# Patient Record
Sex: Male | Born: 1935 | Race: White | Hispanic: No | Marital: Married | Smoking: Never smoker
Health system: Southern US, Community
[De-identification: ages and names within clinical notes are randomized; demographics above are authoritative.]

## PROBLEM LIST (undated history)

## (undated) DIAGNOSIS — G2581 Restless legs syndrome: Principal | ICD-10-CM

## (undated) DIAGNOSIS — I350 Nonrheumatic aortic (valve) stenosis: Secondary | ICD-10-CM

## (undated) DIAGNOSIS — G609 Hereditary and idiopathic neuropathy, unspecified: Secondary | ICD-10-CM

## (undated) DIAGNOSIS — I447 Left bundle-branch block, unspecified: Secondary | ICD-10-CM

## (undated) DIAGNOSIS — I493 Ventricular premature depolarization: Secondary | ICD-10-CM

## (undated) DIAGNOSIS — N189 Chronic kidney disease, unspecified: Secondary | ICD-10-CM

## (undated) DIAGNOSIS — I779 Disorder of arteries and arterioles, unspecified: Secondary | ICD-10-CM

## (undated) DIAGNOSIS — I255 Ischemic cardiomyopathy: Secondary | ICD-10-CM

## (undated) DIAGNOSIS — M1990 Unspecified osteoarthritis, unspecified site: Secondary | ICD-10-CM

## (undated) DIAGNOSIS — I5042 Chronic combined systolic (congestive) and diastolic (congestive) heart failure: Secondary | ICD-10-CM

## (undated) DIAGNOSIS — I495 Sick sinus syndrome: Secondary | ICD-10-CM

## (undated) DIAGNOSIS — E7800 Pure hypercholesterolemia, unspecified: Secondary | ICD-10-CM

## (undated) DIAGNOSIS — H353130 Nonexudative age-related macular degeneration, bilateral, stage unspecified: Secondary | ICD-10-CM

## (undated) DIAGNOSIS — E119 Type 2 diabetes mellitus without complications: Secondary | ICD-10-CM

## (undated) DIAGNOSIS — I739 Peripheral vascular disease, unspecified: Secondary | ICD-10-CM

## (undated) DIAGNOSIS — M810 Age-related osteoporosis without current pathological fracture: Secondary | ICD-10-CM

## (undated) DIAGNOSIS — K219 Gastro-esophageal reflux disease without esophagitis: Secondary | ICD-10-CM

## (undated) DIAGNOSIS — I4819 Other persistent atrial fibrillation: Secondary | ICD-10-CM

## (undated) DIAGNOSIS — I10 Essential (primary) hypertension: Secondary | ICD-10-CM

## (undated) DIAGNOSIS — Z8719 Personal history of other diseases of the digestive system: Secondary | ICD-10-CM

## (undated) DIAGNOSIS — I2510 Atherosclerotic heart disease of native coronary artery without angina pectoris: Secondary | ICD-10-CM

## (undated) DIAGNOSIS — R002 Palpitations: Secondary | ICD-10-CM

## (undated) HISTORY — DX: Nonrheumatic aortic (valve) stenosis: I35.0

## (undated) HISTORY — DX: Pure hypercholesterolemia, unspecified: E78.00

## (undated) HISTORY — DX: Restless legs syndrome: G25.81

## (undated) HISTORY — DX: Ischemic cardiomyopathy: I25.5

## (undated) HISTORY — DX: Chronic combined systolic (congestive) and diastolic (congestive) heart failure: I50.42

## (undated) HISTORY — PX: HERNIA REPAIR: SHX51

## (undated) HISTORY — PX: HIATAL HERNIA REPAIR: SHX195

## (undated) HISTORY — DX: Palpitations: R00.2

## (undated) HISTORY — PX: APPENDECTOMY: SHX54

## (undated) HISTORY — DX: Disorder of arteries and arterioles, unspecified: I77.9

## (undated) HISTORY — DX: Essential (primary) hypertension: I10

## (undated) HISTORY — DX: Peripheral vascular disease, unspecified: I73.9

## (undated) HISTORY — DX: Hereditary and idiopathic neuropathy, unspecified: G60.9

## (undated) HISTORY — DX: Left bundle-branch block, unspecified: I44.7

## (undated) HISTORY — DX: Ventricular premature depolarization: I49.3

## (undated) HISTORY — DX: Sick sinus syndrome: I49.5

## (undated) HISTORY — DX: Other persistent atrial fibrillation: I48.19

## (undated) HISTORY — PX: CATARACT EXTRACTION W/ INTRAOCULAR LENS  IMPLANT, BILATERAL: SHX1307

## (undated) HISTORY — PX: TONSILLECTOMY: SUR1361

## (undated) HISTORY — PX: INGUINAL HERNIA REPAIR: SUR1180

---

## 1997-04-07 HISTORY — PX: CORONARY ARTERY BYPASS GRAFT: SHX141

## 1997-04-26 DIAGNOSIS — I493 Ventricular premature depolarization: Secondary | ICD-10-CM

## 1997-04-26 HISTORY — PX: CARDIAC CATHETERIZATION: SHX172

## 1997-04-26 HISTORY — DX: Ventricular premature depolarization: I49.3

## 2004-08-06 ENCOUNTER — Encounter (INDEPENDENT_AMBULATORY_CARE_PROVIDER_SITE_OTHER): Payer: Self-pay | Admitting: *Deleted

## 2004-08-06 ENCOUNTER — Observation Stay (HOSPITAL_COMMUNITY): Admission: RE | Admit: 2004-08-06 | Discharge: 2004-08-07 | Payer: Self-pay | Admitting: General Surgery

## 2005-12-31 ENCOUNTER — Ambulatory Visit (HOSPITAL_COMMUNITY): Admission: RE | Admit: 2005-12-31 | Discharge: 2005-12-31 | Payer: Self-pay | Admitting: Family Medicine

## 2006-01-07 ENCOUNTER — Ambulatory Visit (HOSPITAL_COMMUNITY): Admission: RE | Admit: 2006-01-07 | Discharge: 2006-01-07 | Payer: Self-pay | Admitting: Family Medicine

## 2006-02-04 ENCOUNTER — Ambulatory Visit (HOSPITAL_COMMUNITY): Admission: RE | Admit: 2006-02-04 | Discharge: 2006-02-04 | Payer: Self-pay | Admitting: Family Medicine

## 2006-03-12 ENCOUNTER — Encounter: Admission: RE | Admit: 2006-03-12 | Discharge: 2006-03-12 | Payer: Self-pay | Admitting: Endocrinology

## 2006-08-05 ENCOUNTER — Ambulatory Visit (HOSPITAL_COMMUNITY): Admission: RE | Admit: 2006-08-05 | Discharge: 2006-08-05 | Payer: Self-pay | Admitting: Family Medicine

## 2006-09-04 ENCOUNTER — Ambulatory Visit (HOSPITAL_COMMUNITY): Admission: RE | Admit: 2006-09-04 | Discharge: 2006-09-04 | Payer: Self-pay | Admitting: Family Medicine

## 2007-03-17 ENCOUNTER — Encounter: Admission: RE | Admit: 2007-03-17 | Discharge: 2007-03-17 | Payer: Self-pay | Admitting: Endocrinology

## 2008-04-12 ENCOUNTER — Encounter: Admission: RE | Admit: 2008-04-12 | Discharge: 2008-04-12 | Payer: Self-pay | Admitting: Endocrinology

## 2008-12-01 ENCOUNTER — Encounter (INDEPENDENT_AMBULATORY_CARE_PROVIDER_SITE_OTHER): Payer: Self-pay | Admitting: *Deleted

## 2009-07-21 ENCOUNTER — Telehealth: Payer: Self-pay | Admitting: Gastroenterology

## 2010-06-25 ENCOUNTER — Encounter
Admission: RE | Admit: 2010-06-25 | Discharge: 2010-06-25 | Payer: Self-pay | Source: Home / Self Care | Attending: Endocrinology | Admitting: Endocrinology

## 2010-07-10 ENCOUNTER — Ambulatory Visit (HOSPITAL_COMMUNITY)
Admission: RE | Admit: 2010-07-10 | Discharge: 2010-07-10 | Payer: Self-pay | Source: Home / Self Care | Attending: Podiatry | Admitting: Podiatry

## 2010-08-07 NOTE — Progress Notes (Signed)
Summary: Changed practices.   Phone Note Outgoing Call Call back at [facility] Phone [phone]   Call placed by: Harlow Mares CMA Duncan Dull),  July 21, 2009 4:27 PM Call placed to: Patient Summary of Call: spoke to patients wife she states that the patient is having his colonoscopies done by a GI in [place]. I had Lady Gary put a note in [facility] patient transfered care.  Initial call taken by: Harlow Mares CMA Duncan Dull),  July 21, 2009 4:27 PM

## 2010-11-23 NOTE — Op Note (Signed)
NAME:  Melvin, [name]           ACCOUNT NO.:  [id number]   MEDICAL RECORD NO.:  [id number]          PATIENT TYPE:  AMB   LOCATION:  DAY                          FACILITY:  [facility]   PHYSICIAN:  Anselm Pancoast. Weatherly, M.D.DATE OF BIRTH:  Dec 19, 1927   DATE OF PROCEDURE:  08/06/2004  DATE OF DISCHARGE:                                 OPERATIVE REPORT   PREOPERATIVE DIAGNOSIS:  Right inguinal hernia.   POSTOPERATIVE DIAGNOSIS:  Right inguinal hernia, direct.   OPERATION:  Right inguinal herniorrhaphy.   ANESTHESIA:  Local and sedation MAC.   SURGEON:  Dr. Consuello Bossier   ASSISTANT:  Nurse.   HISTORY:  Melvin [name] is a 77 year old diabetic, referred [place] by Dr.  Tresa Endo for a symptomatic right inguinal hernia.  He has had previous coronary  artery stents and is followed by Dr. Tresa Endo, has developed a symptomatic  right inguinal hernia, and was referred to [place] for cardiac repair.  He had a  Cardiolite evaluation in December and was thought to be medically cleared,  and we are planning to keep him overnight.  The patient is a mild diabetic  on Altace and preoperatively his sugar was, I think, about 130.   The patient was 1 g of Kefzol and taken back to the operative suite after  the right side was marked, the patient identified, etc.  Time out in the  operating room.  The right groin area was, of course, clipped and then  prepped with Betadine surgical scrub and solution and draped in a sterile  manner.  The inguinal incision area was infiltrated with a mixture of 0.5%  Marcaine with adrenalin and 0.25% Xylocaine, and then the ilioinguinal nerve  area was infiltrated with a blunted 22 gauge needle of approximately 2 mL of  the solution.  The incision was made, sharp dissection down through the skin  and subcutaneous tissue.  He was having a little bit of discomfort, and a  little bit more Xylocaine was placed, and two superficial veins looking  quite large were identified,  clamped, divided, and ligated with 3-0 Vicryl.  The external oblique aponeurosis was opened up, and there was a large  iliohypogastric nerve that was identified, and the cord structures were  elevated with a Penrose.  The patient had a large indirect hernia sac,  probably 6 inches in length that was opened.  It was freed from the cord  structures, and it had a lot of omentum kind of incarcerated within it, why  it was not completely reducible.  This was freed up.  The little vessels  were clamped with 3-0 Vicryl and then this dropped back into the peritoneal  cavity under direct vision after the hernia sac had been freed from the  surrounding cord structures.  High sac ligation was performed with 0  Surgilon and a second suture just placed distal to it.  The hernia sac was  then removed.  It retracts up into its preperitoneal space nicely.  Next, I  used the 0 Surgilon to kind of repair the floor.  It was not really a direct  hernia, but  where this large hernia had sort of enlarged the internal ring,  etc.  The new created internal ring would admit the index finger easily.  Next, a piece of Prolene mesh shaped like a sail was slit laterally, was  placed over the floor, starting at the symphysis pubis.  The inferior limb  was sutured to the shelving edge of the inguinal ligament, and the two tails  were sutured together laterally.  The iliohypogastric was kind of up under  the mesh.  I did not think it could come up with the cord structures, and I  freed it up so that the mesh would kind of slip under it.  Next, the  superior limb was sutured down with interrupted 2-0 Prolenes, and it is  laying flat but not snug or tight.  Next, the two tails had been sutured  together laterally, kind of reinforcing the internal ring.  The external  oblique was closed with a running 3-0 Vicryl, Scarpa's  fascia closed with interrupted 3-0 Vicryl, and a 4-0 Dexon subcuticular  placed and then Benzoin and  Steri-Strips on the skin.  The patient tolerated  the procedure nicely and was sent to the recovery room in a stable postop  condition.  He will have liquids this afternoon, keep him overnight, and he  should be able to be discharged in the a.m.      WJW/MEDQ  D:  08/06/2004  T:  08/06/2004  Job:  [id number]   cc:   Nicki Guadalajara, M.D.  [phone]  [place], [place] [address]  Fax: [phone]

## 2011-02-05 ENCOUNTER — Ambulatory Visit (HOSPITAL_COMMUNITY)
Admission: RE | Admit: 2011-02-05 | Discharge: 2011-02-06 | Disposition: A | Discharge status: Home / Self Care | Payer: Medicare Other | Source: Ambulatory Visit | Attending: Cardiovascular Disease | Admitting: Cardiovascular Disease

## 2011-02-05 DIAGNOSIS — I70219 Atherosclerosis of native arteries of extremities with intermittent claudication, unspecified extremity: Secondary | ICD-10-CM | POA: Insufficient documentation

## 2011-02-05 DIAGNOSIS — I10 Essential (primary) hypertension: Secondary | ICD-10-CM | POA: Insufficient documentation

## 2011-02-05 DIAGNOSIS — I447 Left bundle-branch block, unspecified: Secondary | ICD-10-CM | POA: Insufficient documentation

## 2011-02-05 DIAGNOSIS — E785 Hyperlipidemia, unspecified: Secondary | ICD-10-CM | POA: Insufficient documentation

## 2011-02-05 DIAGNOSIS — E119 Type 2 diabetes mellitus without complications: Secondary | ICD-10-CM | POA: Insufficient documentation

## 2011-02-05 DIAGNOSIS — I2510 Atherosclerotic heart disease of native coronary artery without angina pectoris: Secondary | ICD-10-CM | POA: Insufficient documentation

## 2011-02-05 HISTORY — PX: LOWER EXTREMITY ANGIOGRAM: SHX5955

## 2011-02-06 LAB — CBC
HCT: 35.5 % — ABNORMAL LOW (ref 39.0–52.0)
MCH: 31.6 pg (ref 26.0–34.0)
MCHC: 33.2 g/dL (ref 30.0–36.0)
MCV: 95.2 fL (ref 78.0–100.0)
RDW: 12.9 % (ref 11.5–15.5)

## 2011-02-06 LAB — BASIC METABOLIC PANEL
BUN: 17 mg/dL (ref 6–23)
Calcium: 8.5 mg/dL (ref 8.4–10.5)
Chloride: 108 mEq/L (ref 96–112)
Creatinine, Ser: 0.74 mg/dL (ref 0.50–1.35)
GFR calc Af Amer: >60 mL/min (ref >60)
GFR calc non Af Amer: >60 mL/min (ref >60)

## 2011-02-11 LAB — POCT ACTIVATED CLOTTING TIME
Activated Clotting Time: 221 seconds
Activated Clotting Time: 226 seconds
Activated Clotting Time: 232 seconds

## 2011-02-22 NOTE — Procedures (Signed)
NAME:  JAYR, LUPERCIO NO.:  [id number]  MEDICAL RECORD NO.:  [id number]  LOCATION:  6527                         FACILITY:  [facility]  PHYSICIAN:  Nanetta Batty, M.D.   DATE OF BIRTH:  Aug 29, 1927  DATE OF PROCEDURE:  02/05/2011 DATE OF DISCHARGE:                   PERIPHERAL VASCULAR INVASIVE PROCEDURE   PROCEDURE:  Peripheral angiogram.  OPERATOR:  Nanetta Batty, MD  INDICATIONS:  Mr. Ishmael is an 77 year old married Caucasian male father of one child who lives in [place] and was referred to me by Dr. Tresa Endo for [facility] evaluation because of left lower extremity claudication. He has a history of CAD status post multiple interventions in the past and ultimately requiring bypass grafting x5 in 1999.  He has chronic left bundle-branch block.  There is some mild-to-moderate aortic insufficiency.  His other problems include hypertension, hyperlipidemia, and non-insulin-requiring diabetes.  He was supposed to meet Dr. Nolen Mu for non-invasive vascular study at [facility] which showed a left ABI of 26 and the right of 24.  He also complains of left leg claudication.  He presents now for angiography and potential intervention for lifestyle-limiting claudication.  PROCEDURE DESCRIPTION:  The patient was brought to the second floor [facility] angiographic suite in the postabsorptive state.  He was premedicated with p.o. Valium, IV Versed, and fentanyl.  His right groin was prepped and shaved in the usual sterile fashion.  Xylocaine 1% was used for local anesthesia.  A 5-French sheath was inserted into the right femoral artery using standard Seldinger technique.  A 5-French pigtail catheter was used for abdominal aortography with bifemoral runoff using bolus chase digital subtraction step table technique. Visipaque dye was used for the entirety of the case.  Retrograde aortic pressures were monitored during the case.  ANGIOGRAPHIC RESULTS: 1. Abdominal  aorta;     a.     Renal arteries - normal.     b.     Infrarenal abdominal aorta - normal. 2. Left lower extremity;     a.     70% calcified distal left SFA stenosis.     b.     Short segment calcified occlusion of the popliteal just      behind the knee.     c.     95% below-the-knee popliteal into the tibial peroneal trunk      with an occluded anterior tib and posterior tib. 3. Right lower extremity;     a.     80% segmental calcified distal right SFA stenosis.     b.     Total anterior tib and posterior tib with a 95%      tibioperoneal trunk.  IMPRESSION:  Mr. Grounds has severe calcified popliteal and tibioperoneal disease with lifestyle-limiting claudication.  We will proceed with [facility] orbital rotational atherectomy, percutaneous transluminal angioplasty.  PROCEDURE DESCRIPTION:  Contralateral access was obtained with a crossover catheter, Versacore wire, and 7-French Destination sheath. The patient received a total of 281 mL of contrast, 11,000 units of heparin with an ACT of 232. The total popliteal was crossed with a 0.014 CXI crossing catheter along with a Treasure 12 wire.  This was then advanced into the peroneal vessel and the Treasure 12 wire was exchanged for a  viper wire. Following this, a 1.25 micro crown to cross the total popliteal and atherectomize the below-the-knee popliteal and tibioperoneal trunk up to 120,000 rpm.  This was then removed and a small NAV6 filter was placed over the viper wire in the peroneal to prevent distal embolization.  A 2- 0 stealth bur was then placed and atherectomy was performed in the popliteal up to 120,000 rpm and at tibioperoneal trunk at 60,000 rpm. Copious amounts of intra-arterial nitroglycerin was administered. Angiography after atherectomy revealed a significant amount of calcific plaque ablation.  Following this, the focal popliteal lesion was angioplastied with a 5 x 2 balloon and the long below-the-knee  popliteal and tibioperoneal trunk was angioplastied with a 4 x 4 balloon resulting reduction of a short segment occlusion in popliteal to less than 20% without dissection and a long 95% below-the-knee popliteal to the peroneal trunk to less than 20% stenosis.  Completion angiography was performed revealing straight line flow in the peroneal to just above he ankle with collateralization of the AT and PT.  The PT was demonstrated to be patent proximally with a high-grade eccentric proximal callus calcified lesion.  The sheath was then withdrawn across the bifurcation, secured and the patient left the lab in stable condition.  IMPRESSION:  Successful [place] orbital rotational atherectomy, percutaneous transluminal angioplasty of high-grade calcified popliteal and tibioperoneal stenoses for lifestyle-limiting claudication.  The patient will be hydrated overnight, treated with aspirin and Plavix, and discharged home in the morning.  We will get followup Dopplers and we will see him back in 2-3 weeks.  He left the lab in stable condition.     Nanetta Batty, M.D.     JB/MEDQ  D:  02/05/2011  T:  02/06/2011  Job:  [id number]  cc:   Second Floor [facility] Angiographic Suite [facility] B. Theola Sequin, MD Nicki Guadalajara, M.D. [facility]  Electronically Signed by Nanetta Batty M.D. on 02/22/2011 03:35:00 PM

## 2011-02-22 NOTE — Discharge Summary (Signed)
NAMEWILFORD, [name] NO.:  [id number]  MEDICAL RECORD NO.:  [id number]  LOCATION:  6527                         FACILITY:  [facility]  PHYSICIAN:  Nanetta Batty, M.D.   DATE OF BIRTH:  01-15-1928  DATE OF ADMISSION:  02/05/2011 DATE OF DISCHARGE:                              DISCHARGE SUMMARY   DISCHARGE DIAGNOSES: 1. Peripheral vascular disease.  Status post orbital rotational     atherectomy performed in the popliteal and the tibioperoneal trunk.     Also angioplasty of the focal popliteal lesion and the long below-     the-knee popliteal and tibioperoneal trunk. 2. History of hypertension. 3. Hyperlipidemia. 4. Noninsulin-requiring diabetes. 5. History of coronary artery disease status post bypass grafting x5     in 1999.  HOSPITAL COURSE:  Melvin [name] is an 77 year old Caucasian male with history of coronary artery disease status post bypass grafting x5 in 1999, peripheral vascular disease, chronic left bundle-branch block, hypertension, hyperlipidemia and diabetes mellitus type 2.  He was admitted for peripheral vascular angiogram in the lower extremities which required rotational atherectomy to the popliteal and tibioperoneal trunk, this was completed successfully.  The patient has been seen by Dr. Allyson Sabal, feels stable for discharge home with followup on lower extremity Dopplers and subsequent office visit.  DISCHARGE LABS:  WBC 6.5, hemoglobin 11.8, hematocrit 35.5, platelets 110.  Sodium 139, potassium 4.6, chloride 108, carbon dioxide 24, glucose 137, BUN 17, creatinine 0.74, calcium 8.5.  STUDIES/PROCEDURES: 1. Lower extremity peripheral angiogram. 2. Abdominal aorta:  Renal arteries are normal, infrarenal abdominal     aorta normal. 3. Left lower extremity shows 70% calcified distal left SFA stenosis.     Short segment calcified occlusion in the popliteal just behind the     knee.  95% below-the-knee popliteal into the tibial peroneal trunk   with occluded anterior tib and posterior tib. 4. Right lower extremity shows 80% segmental calcified distal right     SFA stenosis.  Total anterior tib and posterior tib with 95%     tibioperoneal trunk. 5. Rotational atherectomy and angioplasty was performed in the     popliteal and tibioperoneal trunk.  DISCHARGE MEDICATIONS: 1. Afrin nasal spray once per each nostril every 4 hours as needed for     nasal stoppage. 2. Amlodipine/atorvastatin 5/40 mg 1 tablet by mouth daily. 3. Aspirin, enteric coated 325 mg 1 tablet by mouth daily. 4. Atenolol 25 mg 1 tablet by mouth daily at bedtime. 5. Atenolol 50 mg 1 tablet by mouth every morning. 6. Benicar 10 mg 1 tablet by mouth daily 7. Calcium carbonate/vitamin D over-the-counter 1 tablet by mouth     twice daily. 8. Plavix 75 mg 1 tablet by mouth daily. 9. FiberCon over the counter 1 tablet by mouth twice daily. 10.Janumet XR 100/500 mg 1 tablet by mouth daily.  The patient will     restart this medication on Thursday. 11.Multivitamin therapeutic 1 tablet by mouth daily. 12.Niaspan 500 mg 3 tablets by mouth daily. 13.Nitroglycerin sublingual 0.4 mg 1 tablet under the tongue every 5     minutes up to 3 doses total for chest pain as needed. 14.PreserVision vitamin supplement over the counter 1 capsule  by mouth     daily. 15.Torsemide 20 mg 1 tablet by mouth daily.  DISPOSITION:  Melvin [name] will be discharged home in stable condition. He is recommended to increase his activity slowly.  He may shower and bathe.  No lifting or driving for 3 days.  He is recommended to eat heart-healthy, low-carbohydrate diet.  He will return to [facility] for lower extremity arterial Dopplers and then to follow up with Dr. Allyson Sabal thereafter.    ______________________________ [name], PA   ______________________________ Nanetta Batty, M.D.    BH/MEDQ  D:  02/06/2011  T:  02/06/2011  Job:  [id number]  cc:   Nanetta Batty,  M.D. [facility]  Electronically Signed by [name] PA on 02/15/2011 11:37:29 AM Electronically Signed by Nanetta Batty M.D. on 02/22/2011 03:34:57 PM

## 2011-07-01 ENCOUNTER — Other Ambulatory Visit: Payer: Self-pay | Admitting: Endocrinology

## 2011-07-01 MED ORDER — ZOLEDRONIC ACID 5 MG/100ML IV SOLN: 5.0000 mg | Freq: Once | INTRAVENOUS | Status: DC

## 2011-08-08 ENCOUNTER — Encounter (HOSPITAL_COMMUNITY): Payer: PRIVATE HEALTH INSURANCE

## 2011-08-19 HISTORY — PX: DOPPLER ECHOCARDIOGRAPHY: SHX263

## 2011-08-22 ENCOUNTER — Other Ambulatory Visit (HOSPITAL_COMMUNITY): Payer: Self-pay | Admitting: *Deleted

## 2011-08-26 ENCOUNTER — Encounter (HOSPITAL_COMMUNITY)
Admission: RE | Admit: 2011-08-26 | Discharge: 2011-08-26 | Disposition: A | Discharge status: Home / Self Care | Payer: Medicare Other | Source: Ambulatory Visit | Attending: Endocrinology | Admitting: Endocrinology

## 2011-08-26 DIAGNOSIS — M810 Age-related osteoporosis without current pathological fracture: Secondary | ICD-10-CM | POA: Insufficient documentation

## 2011-08-26 MED ORDER — ZOLEDRONIC ACID 5 MG/100ML IV SOLN
5.0000 mg | Freq: Once | INTRAVENOUS | Status: AC
  Administered 2011-08-26: 5 mg via INTRAVENOUS
  Filled 2011-08-26: qty 100

## 2011-09-06 HISTORY — PX: NM MYOCAR PERF EJECTION FRACTION: HXRAD630

## 2011-10-28 ENCOUNTER — Ambulatory Visit (INDEPENDENT_AMBULATORY_CARE_PROVIDER_SITE_OTHER): Payer: Medicare Other | Admitting: Ophthalmology

## 2011-10-28 DIAGNOSIS — H35379 Puckering of macula, unspecified eye: Secondary | ICD-10-CM

## 2011-10-28 DIAGNOSIS — H43819 Vitreous degeneration, unspecified eye: Secondary | ICD-10-CM

## 2011-10-28 DIAGNOSIS — E1165 Type 2 diabetes mellitus with hyperglycemia: Secondary | ICD-10-CM

## 2011-10-28 DIAGNOSIS — E11319 Type 2 diabetes mellitus with unspecified diabetic retinopathy without macular edema: Secondary | ICD-10-CM

## 2011-10-28 DIAGNOSIS — I10 Essential (primary) hypertension: Secondary | ICD-10-CM

## 2011-10-28 DIAGNOSIS — H35039 Hypertensive retinopathy, unspecified eye: Secondary | ICD-10-CM

## 2011-10-28 DIAGNOSIS — H26499 Other secondary cataract, unspecified eye: Secondary | ICD-10-CM

## 2011-10-28 DIAGNOSIS — H2510 Age-related nuclear cataract, unspecified eye: Secondary | ICD-10-CM

## 2011-11-08 ENCOUNTER — Ambulatory Visit (INDEPENDENT_AMBULATORY_CARE_PROVIDER_SITE_OTHER): Payer: Medicare Other | Admitting: Ophthalmology

## 2011-11-08 ENCOUNTER — Other Ambulatory Visit: Payer: Self-pay | Admitting: Specialist

## 2011-11-08 DIAGNOSIS — N281 Cyst of kidney, acquired: Secondary | ICD-10-CM

## 2011-11-08 DIAGNOSIS — H2700 Aphakia, unspecified eye: Secondary | ICD-10-CM

## 2011-11-13 ENCOUNTER — Ambulatory Visit
Admission: RE | Admit: 2011-11-13 | Discharge: 2011-11-13 | Disposition: A | Discharge status: Home / Self Care | Payer: Medicare Other | Source: Ambulatory Visit | Attending: Specialist | Admitting: Specialist

## 2011-11-13 DIAGNOSIS — N281 Cyst of kidney, acquired: Secondary | ICD-10-CM

## 2011-11-14 ENCOUNTER — Other Ambulatory Visit (HOSPITAL_COMMUNITY): Payer: Self-pay | Admitting: Family Medicine

## 2011-11-14 DIAGNOSIS — K7689 Other specified diseases of liver: Secondary | ICD-10-CM

## 2011-11-19 ENCOUNTER — Ambulatory Visit (HOSPITAL_COMMUNITY)
Admission: RE | Admit: 2011-11-19 | Discharge: 2011-11-19 | Disposition: A | Discharge status: Home / Self Care | Payer: Medicare Other | Source: Ambulatory Visit | Attending: Family Medicine | Admitting: Family Medicine

## 2011-11-19 DIAGNOSIS — K7689 Other specified diseases of liver: Secondary | ICD-10-CM

## 2011-11-19 DIAGNOSIS — K769 Liver disease, unspecified: Secondary | ICD-10-CM | POA: Insufficient documentation

## 2011-11-19 LAB — POCT I-STAT, CHEM 8
BUN: 22 mg/dL (ref 6–23)
Chloride: 105 mEq/L (ref 96–112)
Creatinine, Ser: 0.8 mg/dL (ref 0.50–1.35)
Hemoglobin: 13.9 g/dL (ref 13.0–17.0)
Potassium: 4.4 mEq/L (ref 3.5–5.1)
Sodium: 140 mEq/L (ref 135–145)

## 2011-11-19 MED ORDER — IOHEXOL 300 MG/ML  SOLN
100.0000 mL | Freq: Once | INTRAMUSCULAR | Status: AC | PRN
  Administered 2011-11-19: 100 mL via INTRAVENOUS

## 2011-11-21 ENCOUNTER — Encounter (INDEPENDENT_AMBULATORY_CARE_PROVIDER_SITE_OTHER): Payer: Self-pay | Admitting: *Deleted

## 2011-11-26 ENCOUNTER — Ambulatory Visit (INDEPENDENT_AMBULATORY_CARE_PROVIDER_SITE_OTHER): Payer: Medicare Other | Admitting: Internal Medicine

## 2011-11-26 ENCOUNTER — Encounter (INDEPENDENT_AMBULATORY_CARE_PROVIDER_SITE_OTHER): Payer: Self-pay | Admitting: Internal Medicine

## 2011-11-26 VITALS — BP 110/62 | HR 72 | Temp 97.8°F | Ht 67.0 in | Wt 175.2 lb

## 2011-11-26 DIAGNOSIS — E7800 Pure hypercholesterolemia, unspecified: Secondary | ICD-10-CM

## 2011-11-26 DIAGNOSIS — K7689 Other specified diseases of liver: Secondary | ICD-10-CM

## 2011-11-26 DIAGNOSIS — E785 Hyperlipidemia, unspecified: Secondary | ICD-10-CM | POA: Insufficient documentation

## 2011-11-26 DIAGNOSIS — I10 Essential (primary) hypertension: Secondary | ICD-10-CM | POA: Insufficient documentation

## 2011-11-26 DIAGNOSIS — K769 Liver disease, unspecified: Secondary | ICD-10-CM | POA: Insufficient documentation

## 2011-11-26 NOTE — Progress Notes (Addendum)
Subjective:     Patient ID: Melvin [name], male   DOB: March 24, 1928, 78 y.o.   MRN: [id number]  HPI Romeo Apple is here today as a referral for a liver lesion. 11/19/2011.  He saw Dr Otelia Sergeant in [place] due to pain in his left leg. [place] was ordered. US revealed IMPRESSION:  1. Complex lesion in the right hepatic lobe with calcification.  Recommend further imaging evaluation with pre and post contrast CT  scan of the abdomen.  2. Small bilateral renal cysts.  CT abdomen/pelvis with CM:IMPRESSION:  Partly calcified cystic posterior segment right hepatic lobe mass  with possible partial posterior rim enhancement and nodular  septations. Differential considerations include biliary  cystadenoma/cystadenocarcinoma, hydatid cyst, solitary cystic  metastasis, undifferentiated sarcoma, atypical cystic variant of  hepatocellular carcinoma, less likely prior abscess or result of  trauma. Correlate with elevated tumor markers or other evidence for  metastatic disease, exposures to sheep or other risk factors for  echinococcal disease, or known underlying malignancy. These  results were called by telephone on 11/19/2011 at 3:50 p.m. to  [place], [place] for Dr. Phillips Odor, who verbally acknowledged these  results.  No weight loss. Appetite good.No abdominal pain. BM are normal. Usually has 1-2 a day.   Review of Systems see hpi  Past Medical History  Diagnosis Date  . Hypertension   . High cholesterol    Past Surgical History  Procedure Date  . Coronary artery bypass graft     04/1997  . Hiatal hernia surgery   . Appendectomy    Current Outpatient Prescriptions  Medication Sig Dispense Refill  . amLODipine-benazepril (LOTREL) 5-40 MG per capsule Take 1 capsule by mouth daily.      Marland Kitchen aspirin 325 MG tablet Take 325 mg by mouth daily.      Marland Kitchen atenolol (TENORMIN) 50 MG tablet Take 50 mg by mouth daily.      . clopidogrel (PLAVIX) 75 MG tablet Take 75 mg by mouth daily.      Marland Kitchen gabapentin (NEURONTIN) 100  MG capsule Take 100 mg by mouth as needed.      . metformin (FORTAMET) 1000 MG (OSM) 24 hr tablet Take 1,000 mg by mouth 2 (two) times daily with a meal.      . multivitamin-iron-minerals-folic acid (CENTRUM) chewable tablet Chew 1 tablet by mouth daily.      . niacin (NIASPAN) 1000 MG CR tablet Take 1,500 mg by mouth at bedtime.      Marland Kitchen olmesartan (BENICAR) 5 MG tablet Take 10 mg by mouth daily.      Marland Kitchen torsemide (DEMADEX) 20 MG tablet Take 20 mg by mouth daily.      . zoledronic acid (RECLAST) 5 MG/100ML SOLN Inject 100 mLs (5 mg total) into the vein once.  100 mL  0   History   Social History  . Marital Status: Married    Spouse Name: N/A    Number of Children: N/A  . Years of Education: N/A   Occupational History  . Not on file.   Social History Main Topics  . Smoking status: Never Smoker   . Smokeless tobacco: Not on file  . Alcohol Use: No  . Drug Use: No  . Sexually Active: Not on file   Other Topics Concern  . Not on file   Social History Narrative  . No narrative on file   Family Status  Relation Status Death Age  . Mother Deceased     CAD  . Father Deceased  MI   No Known Allergies       Objective:   Physical Exam. Filed Vitals:   11/26/11 0953  Height: 5\' 7"  (1.702 m)  Weight: 175 lb 3.2 oz (79.47 kg)  Alert and oriented. Skin warm and dry. Oral mucosa is moist.   . Sclera anicteric, conjunctivae is pink. Thyroid not enlarged. No cervical lymphadenopathy. Lungs clear. Heart regular rate and rhythm.  Abdomen is soft. Bowel sounds are positive. No hepatomegaly. No abdominal masses felt. No tenderness.  No edema to lower extremities. Dr Karilyn Cota and examined and discussed results with patient.        11/01/2011 H and H 12.9 and 40.8, Platelet ct 1543 AST 26, ALP 45, ALT 17, bili 0.8 . CBC    Component Value Date/Time   WBC 6.5 02/06/2011 0545   RBC 3.73* 02/06/2011 0545   HGB 13.9 11/19/2011 1415   HCT 41.0 11/19/2011 1415   PLT 110* 02/06/2011 0545     MCV 95.2 02/06/2011 0545   MCH 31.6 02/06/2011 0545   MCHC 33.2 02/06/2011 0545   RDW 12.9 02/06/2011 0545        Assessment:   Liver lesion.  Patient is asymptomatic. Dr. Karilyn Cota will review CT with Dr Tyron Russell today and further recommendations to follow. Patient was reassured.    Plan:   Dr Karilyn Cota to review CT. Further recommendations to follow.

## 2011-11-26 NOTE — Patient Instructions (Signed)
Dr Karilyn Cota to talk with DR. Boles concerning CT report

## 2011-11-27 ENCOUNTER — Telehealth (INDEPENDENT_AMBULATORY_CARE_PROVIDER_SITE_OTHER): Payer: Self-pay | Admitting: Internal Medicine

## 2011-11-27 NOTE — Telephone Encounter (Signed)
Will be scheduled for an [place] in 3 months for surveillance of a liver lesion.

## 2011-11-27 NOTE — Telephone Encounter (Signed)
Results of his xray given to patient.  He will have a f/u US in 3 month for liver lesion.

## 2011-12-05 ENCOUNTER — Encounter (INDEPENDENT_AMBULATORY_CARE_PROVIDER_SITE_OTHER): Payer: Self-pay

## 2011-12-10 ENCOUNTER — Other Ambulatory Visit (HOSPITAL_COMMUNITY): Payer: Self-pay | Admitting: Specialist

## 2011-12-10 DIAGNOSIS — M545 Low back pain: Secondary | ICD-10-CM

## 2011-12-10 DIAGNOSIS — M79605 Pain in left leg: Secondary | ICD-10-CM

## 2011-12-19 ENCOUNTER — Ambulatory Visit (HOSPITAL_COMMUNITY): Payer: Medicare Other

## 2011-12-19 ENCOUNTER — Encounter (HOSPITAL_COMMUNITY)
Admission: RE | Admit: 2011-12-19 | Discharge: 2011-12-19 | Disposition: A | Discharge status: Home / Self Care | Payer: Medicare Other | Source: Ambulatory Visit | Attending: Specialist | Admitting: Specialist

## 2011-12-19 DIAGNOSIS — M4120 Other idiopathic scoliosis, site unspecified: Secondary | ICD-10-CM | POA: Insufficient documentation

## 2011-12-19 DIAGNOSIS — M545 Low back pain, unspecified: Secondary | ICD-10-CM | POA: Insufficient documentation

## 2011-12-19 DIAGNOSIS — M79605 Pain in left leg: Secondary | ICD-10-CM

## 2011-12-19 MED ORDER — TECHNETIUM TC 99M MEDRONATE IV KIT
25.0000 | PACK | Freq: Once | INTRAVENOUS | Status: AC | PRN
  Administered 2011-12-19: 25 via INTRAVENOUS

## 2012-01-06 ENCOUNTER — Ambulatory Visit (INDEPENDENT_AMBULATORY_CARE_PROVIDER_SITE_OTHER): Payer: Medicare Other | Admitting: Internal Medicine

## 2012-02-06 HISTORY — PX: OTHER SURGICAL HISTORY: SHX169

## 2012-02-12 ENCOUNTER — Encounter (INDEPENDENT_AMBULATORY_CARE_PROVIDER_SITE_OTHER): Payer: Self-pay | Admitting: *Deleted

## 2012-03-02 ENCOUNTER — Other Ambulatory Visit (INDEPENDENT_AMBULATORY_CARE_PROVIDER_SITE_OTHER): Payer: Self-pay | Admitting: Internal Medicine

## 2012-03-02 DIAGNOSIS — K769 Liver disease, unspecified: Secondary | ICD-10-CM

## 2012-03-16 ENCOUNTER — Ambulatory Visit (HOSPITAL_COMMUNITY)
Admission: RE | Admit: 2012-03-16 | Discharge: 2012-03-16 | Disposition: A | Discharge status: Home / Self Care | Payer: Medicare Other | Source: Ambulatory Visit | Attending: Internal Medicine | Admitting: Internal Medicine

## 2012-03-16 DIAGNOSIS — K769 Liver disease, unspecified: Secondary | ICD-10-CM

## 2012-03-16 DIAGNOSIS — K7689 Other specified diseases of liver: Secondary | ICD-10-CM | POA: Insufficient documentation

## 2012-03-19 ENCOUNTER — Telehealth (INDEPENDENT_AMBULATORY_CARE_PROVIDER_SITE_OTHER): Payer: Self-pay | Admitting: *Deleted

## 2012-03-19 DIAGNOSIS — K769 Liver disease, unspecified: Secondary | ICD-10-CM

## 2012-03-19 NOTE — Telephone Encounter (Signed)
Per Dr.Rehman the patient will need to have LFT in 6 months

## 2012-03-23 NOTE — Progress Notes (Signed)
6 month f/u has been noted on patient's recall list

## 2012-05-18 HISTORY — PX: DOPPLER ECHOCARDIOGRAPHY: SHX263

## 2012-05-21 ENCOUNTER — Other Ambulatory Visit (HOSPITAL_COMMUNITY): Payer: Self-pay

## 2012-05-21 DIAGNOSIS — I359 Nonrheumatic aortic valve disorder, unspecified: Secondary | ICD-10-CM

## 2012-06-22 ENCOUNTER — Other Ambulatory Visit (HOSPITAL_COMMUNITY): Payer: Self-pay | Admitting: Cardiovascular Disease

## 2012-06-22 DIAGNOSIS — I359 Nonrheumatic aortic valve disorder, unspecified: Secondary | ICD-10-CM

## 2012-06-24 ENCOUNTER — Other Ambulatory Visit: Payer: Self-pay | Admitting: Endocrinology

## 2012-06-24 DIAGNOSIS — M810 Age-related osteoporosis without current pathological fracture: Secondary | ICD-10-CM

## 2012-06-26 ENCOUNTER — Other Ambulatory Visit: Payer: Medicare Other

## 2012-07-07 ENCOUNTER — Ambulatory Visit
Admission: RE | Admit: 2012-07-07 | Discharge: 2012-07-07 | Disposition: A | Discharge status: Home / Self Care | Payer: Medicare Other | Source: Ambulatory Visit | Attending: Endocrinology | Admitting: Endocrinology

## 2012-07-07 DIAGNOSIS — M810 Age-related osteoporosis without current pathological fracture: Secondary | ICD-10-CM

## 2012-08-10 ENCOUNTER — Ambulatory Visit (HOSPITAL_COMMUNITY): Payer: Medicare Other

## 2012-08-27 ENCOUNTER — Other Ambulatory Visit (HOSPITAL_COMMUNITY): Payer: Self-pay | Admitting: *Deleted

## 2012-08-28 ENCOUNTER — Encounter (HOSPITAL_COMMUNITY)
Admission: RE | Admit: 2012-08-28 | Discharge: 2012-08-28 | Disposition: A | Discharge status: Home / Self Care | Payer: Medicare Other | Source: Ambulatory Visit | Attending: Endocrinology | Admitting: Endocrinology

## 2012-08-28 DIAGNOSIS — M810 Age-related osteoporosis without current pathological fracture: Secondary | ICD-10-CM | POA: Insufficient documentation

## 2012-08-28 MED ORDER — ZOLEDRONIC ACID 5 MG/100ML IV SOLN
5.0000 mg | Freq: Once | INTRAVENOUS | Status: AC
  Administered 2012-08-28: 5 mg via INTRAVENOUS

## 2012-08-28 MED ORDER — ZOLEDRONIC ACID 5 MG/100ML IV SOLN
INTRAVENOUS | Status: AC
  Filled 2012-08-28: qty 100

## 2012-09-03 ENCOUNTER — Encounter (INDEPENDENT_AMBULATORY_CARE_PROVIDER_SITE_OTHER): Payer: Self-pay | Admitting: *Deleted

## 2012-09-03 ENCOUNTER — Other Ambulatory Visit (INDEPENDENT_AMBULATORY_CARE_PROVIDER_SITE_OTHER): Payer: Self-pay | Admitting: *Deleted

## 2012-09-03 DIAGNOSIS — K769 Liver disease, unspecified: Secondary | ICD-10-CM

## 2012-09-04 ENCOUNTER — Encounter (INDEPENDENT_AMBULATORY_CARE_PROVIDER_SITE_OTHER): Payer: Self-pay | Admitting: *Deleted

## 2012-09-14 ENCOUNTER — Other Ambulatory Visit (INDEPENDENT_AMBULATORY_CARE_PROVIDER_SITE_OTHER): Payer: Self-pay | Admitting: Internal Medicine

## 2012-09-14 DIAGNOSIS — K769 Liver disease, unspecified: Secondary | ICD-10-CM

## 2012-09-17 LAB — HEPATIC FUNCTION PANEL
ALT: 13 U/L (ref 0–53)
Bilirubin, Direct: 0.1 mg/dL (ref 0.0–0.3)
Indirect Bilirubin: 0.5 mg/dL (ref 0.0–0.9)

## 2012-09-24 ENCOUNTER — Ambulatory Visit (HOSPITAL_COMMUNITY)
Admission: RE | Admit: 2012-09-24 | Discharge: 2012-09-24 | Disposition: A | Discharge status: Home / Self Care | Payer: Medicare Other | Source: Ambulatory Visit | Attending: Internal Medicine | Admitting: Internal Medicine

## 2012-09-24 DIAGNOSIS — K7689 Other specified diseases of liver: Secondary | ICD-10-CM | POA: Insufficient documentation

## 2012-09-24 DIAGNOSIS — K769 Liver disease, unspecified: Secondary | ICD-10-CM

## 2012-09-24 DIAGNOSIS — Q619 Cystic kidney disease, unspecified: Secondary | ICD-10-CM | POA: Insufficient documentation

## 2012-10-01 ENCOUNTER — Encounter: Payer: Self-pay | Admitting: Neurology

## 2012-10-01 ENCOUNTER — Ambulatory Visit (INDEPENDENT_AMBULATORY_CARE_PROVIDER_SITE_OTHER): Payer: Medicare Other | Admitting: Neurology

## 2012-10-01 VITALS — BP 176/80 | HR 54 | Ht 67.0 in | Wt 180.0 lb

## 2012-10-01 DIAGNOSIS — G609 Hereditary and idiopathic neuropathy, unspecified: Secondary | ICD-10-CM

## 2012-10-01 DIAGNOSIS — G2581 Restless legs syndrome: Secondary | ICD-10-CM

## 2012-10-01 HISTORY — DX: Restless legs syndrome: G25.81

## 2012-10-01 HISTORY — DX: Hereditary and idiopathic neuropathy, unspecified: G60.9

## 2012-10-01 MED ORDER — PRAMIPEXOLE DIHYDROCHLORIDE 0.125 MG PO TABS: 0.2500 mg | ORAL_TABLET | Freq: Every day | ORAL | Status: DC

## 2012-10-01 NOTE — Progress Notes (Signed)
Subjective:    Patient ID: Melvin [name] is a 79 y.o. male.  HPI  Interim history:   Interim Hx: Melvin [name] is a very pleasant 79 year old right handed gentleman, who for the past 2 years has been suffering from pain in his left leg. I first met him on 07/21/12 at which time I suggested blood work and a trial of Requip d/t the possibility of atypical RLS. He is unaccompanied today. His blood work was negative for RF, ESR, B12, ACE in 1/14. I then saw him back on 08/2512. He has an underlying Hx of PVD, CAD, DM. He has noticed mild weakness particularly when climbing stairs. His left leg sometimes gives out. He does not typically use a walking aid. He had an MRI L spine in 05/2011 which did not show any structural lesion to account for his symptoms. Incidentally some cystic abnormality was found on his kidney for which he then had further workup. He he's had an ultrasound abdomen in May of last year which showed some abnormality with the right hepatic lobe and small bilateral renal cysts. He also had a nuclear medicine bone scan in June of last year which showed no evidence of fracture or malignancy.  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 Therapeutically, he has tried Neurontin up to 100 mg twice daily. He's had some trouble with palpitations in the recent past and sees a cardiologist. He's had some workup done for that. In January and February of last year he underwent 2 epidural injections. He feels that the first round of injection did not help and the second epidural injection perhaps lasted for a total of 2 weeks. I tried him on Requip the first time I saw him in January and he was only able to take the requip 0.25 mg for 2 days, but was too groggy and could not continue w it. Interestingly, he had no pain for the 2 days he took the medicine. He is now back to a 5/10 on the  pain scale. He has not taken his diuretic today. He was then tried on Mirapex, which I cautiously introduced at 0.125 mg strength 1/2 each night and then 1 pill each night. He reports improvement with his sleep and no further leg pain at night, but residual pain in the daytime, again, almost exclusively on the L leg. No SE reported during the day, no difficulty driving reported. His wife is having medical issues and he is concerned. He had US abdomen on 09/24/12 for abnormalities that were seen in his kidneys and liver. I reviewed the report with him: Complex partially calcified cystic lesion in the right lobe of the liver is very similar in size and appearance to prior examinations dating back to 11/13/2011. This is favored to be benign, but continued surveillance for a total of 2 years is  recommended to ensure a benign lesion. 2. Multiple small cysts in the kidneys bilaterally, as above. 3. Atherosclerosis.  His Past Medical History Is Significant For: Past Medical History  Diagnosis Date  . Hypertension   . High cholesterol     His Past Surgical History Is Significant For: Past Surgical History  Procedure Laterality Date  . Coronary artery bypass graft      04/1997  . Hiatal hernia surgery    . Appendectomy      His Family History Is Significant For: No  family history on file.  His Social History Is Significant For: History   Social History  . Marital Status: Married    Spouse Name: N/A    Number of Children: N/A  . Years of Education: N/A   Social History Main Topics  . Smoking status: Never Smoker   . Smokeless tobacco: None  . Alcohol Use: No  . Drug Use: No  . Sexually Active: None   Other Topics Concern  . None   Social History Narrative  . None    His Allergies Are:  No Known Allergies:   His Current Medications Are:  Outpatient Encounter Prescriptions as of 10/01/2012  Medication Sig Dispense Refill  . amLODipine-benazepril (LOTREL) 5-40 MG per capsule Take  1 capsule by mouth daily.      Marland Kitchen aspirin 325 MG tablet Take 325 mg by mouth daily.      Marland Kitchen atenolol (TENORMIN) 50 MG tablet Take 75 mg by mouth 2 (two) times daily.       . clopidogrel (PLAVIX) 75 MG tablet Take 75 mg by mouth daily.      Marland Kitchen losartan (COZAAR) 50 MG tablet       . metformin (FORTAMET) 1000 MG (OSM) 24 hr tablet Take 1,000 mg by mouth 2 (two) times daily with a meal.      . multivitamin-iron-minerals-folic acid (CENTRUM) chewable tablet Chew 1 tablet by mouth daily.      . niacin (NIASPAN) 1000 MG CR tablet Take 1,500 mg by mouth at bedtime.      Marland Kitchen olmesartan (BENICAR) 5 MG tablet Take 10 mg by mouth daily.      . pramipexole (MIRAPEX) 0.125 MG tablet       . torsemide (DEMADEX) 20 MG tablet Take 20 mg by mouth daily.      Marland Kitchen amLODipine-atorvastatin (CADUET) 5-40 MG per tablet       . gabapentin (NEURONTIN) 100 MG capsule Take 100 mg by mouth as needed.      Marland Kitchen rOPINIRole (REQUIP) 0.25 MG tablet       . zoledronic acid (RECLAST) 5 MG/100ML SOLN Inject 100 mLs (5 mg total) into the vein once.  100 mL  0   No facility-administered encounter medications on file as of 10/01/2012.  :  Review of Systems  HENT: Positive for rhinorrhea.   Respiratory: Positive for shortness of breath.   Cardiovascular: Positive for palpitations.       Murmur  Allergic/Immunologic: Positive for environmental allergies.    Objective:  Neurologic Exam  Physical Exam  Physical Examination:   Filed Vitals:   10/01/12 1157  BP: 176/80  Pulse: 54    General Examination: The patient is a very pleasant 79 y.o. male in no acute distress.  HEENT exam: Normocephalic, atraumatic, face is symmetric with normal facial animation noted. Neck is supple with full range of motion. Hearing is grossly intact. Pupils are equal, round and reactive to light and accommodation. Extraocular tracking is good without nystagmus. Speech is clear. Airway examination reveals mild mouth dryness and normal airway  findings. Chest is clear to auscultation, Heart sounds are normal with the exception of a 2/6-3/6 systolic murmur audible, no change from last 2 times. The patient states he has been told that he has a murmur and leaky valves. Abdomen soft, nontender with positive bowel sounds. Skin examination reveals skin to be warm and dry. There are hyperpigmentations distal lower extremities and scars from prior vein harvesting. He has 2+ pitting edema in the distal  lower extremities to his mid shin areas bilaterally. No change from last time. Neurologically: Mental status: The patient is awake, alert and oriented in all 3 spheres. His memory, attention, language and knowledge are appropriate.  Cranial nerves are as described under HEENT exam. Motor-wise he has normal bulk strength and tone throughout with the exception of 4+ out of 5 weakness in his left hip flexor, unchanged. Reflexes are 2+ in the upper extremities trace in both knees and absent in both ankles. Sensory exam reveals decreased sensation to vibration, pinprick and temperature sense in both distal lower extremities left worse than right. On the left his sensory loss affects the area above the left ankle to mid shin, on the right really only affects the right foot to above ankle. He has normal sensation in his hands and face. He has no drift and no tremor. Romberg testing reveals mild swaying but no corrective steps. His gait, station and balance are unremarkable for age.  Assessment and Plan:   In summary, Melvin [name] is a 78 yo gentleman with a 2 year history of L leg pain with electrodiagnostic data suggesting demyelinating and axonal neuropathy in the left lower extremity. While he is diabetic, his presentation is not very typical for diabetic peripheral neuropathy. His exam shows good strength with the exception of mild left hip flexor weakness and absence of ankle jerks and trace knee jerks, bilaterally. He has sensory neuropathy affecting both  lower extremities, L > R, and he has PVD, veins harvested from both legs, diabetes and peripheral edema. His exam has essentially remained unchanged. Subjectively, since he has been on generic Mirapex your appointment on 0.125 mg each night for the past month he feels he has been sleeping better and his nighttime leg pain is definitely improved. I stiil think we may be dealing with atypical RLS, given the fact, that he moves his leg at night and he cannot sit still for prolonged periods of time and it helps to move or walk. Unfortunately, he was not able to toIerate low dose ropinirole, but interestingly did have 2 days of pain freedom while on it. I had suggested a cautious trial of low dose pramipexole 0.125mg  1/2 pill each night, then 1 pill each night and he feels improved with residual pain in the daytime. Labs looked okay and I reviewed those w him again. I talked to him about common and typical side effects of DAs. Today I suggested that we increase the pramipexole to 0.125 mg 2 pills each night. He was in agreement. I would like to see him back in a couple months for recheck and may consider adding a smaller dose during the day. He is encouraged to call with any interim questions, concerns, problems or updates.

## 2012-10-01 NOTE — Patient Instructions (Signed)
I think overall you are doing fairly well but I do want to suggest a few things today:  Remember to drink plenty of fluid, eat healthy meals and do not skip any meals. Try to eat protein with a every meal and eat a healthy snack such as fruit or nuts in between meals. Try to keep a regular sleep-wake schedule and try to exercise daily, particularly in the form of walking, 20-30 minutes a day.   Engage in social activities in your community and your family and try to keep up with current events by reading the newspaper or watching the news.  As far as your medications are concerned, I would like to suggest a small increase in your medication dose to: pramipexole 0.125 mg take 2 pills at bedtime.   I would like to see you back in 2 months, sooner if the need arises. Please call us with any interim questions, concerns, problems, updates or refill requests.  Brett Canales is my clinical assistant and will answer any of your questions and relay your messages to me.  Our phone number is [phone]. We also have an after hours call service as well and is a physician on-call for urgent questions. For any emergencies you know to call 911 or go to the emergency room.

## 2012-11-09 ENCOUNTER — Ambulatory Visit (INDEPENDENT_AMBULATORY_CARE_PROVIDER_SITE_OTHER): Payer: Medicare Other | Admitting: Ophthalmology

## 2012-11-09 DIAGNOSIS — H2510 Age-related nuclear cataract, unspecified eye: Secondary | ICD-10-CM

## 2012-11-09 DIAGNOSIS — H35039 Hypertensive retinopathy, unspecified eye: Secondary | ICD-10-CM

## 2012-11-09 DIAGNOSIS — H43819 Vitreous degeneration, unspecified eye: Secondary | ICD-10-CM

## 2012-11-09 DIAGNOSIS — E11319 Type 2 diabetes mellitus with unspecified diabetic retinopathy without macular edema: Secondary | ICD-10-CM

## 2012-11-09 DIAGNOSIS — E1165 Type 2 diabetes mellitus with hyperglycemia: Secondary | ICD-10-CM

## 2012-11-09 DIAGNOSIS — H3530 Unspecified macular degeneration: Secondary | ICD-10-CM

## 2012-11-09 DIAGNOSIS — H35379 Puckering of macula, unspecified eye: Secondary | ICD-10-CM

## 2012-11-09 DIAGNOSIS — I10 Essential (primary) hypertension: Secondary | ICD-10-CM

## 2012-11-28 ENCOUNTER — Encounter: Payer: Self-pay | Admitting: Cardiovascular Disease

## 2012-12-01 ENCOUNTER — Ambulatory Visit (INDEPENDENT_AMBULATORY_CARE_PROVIDER_SITE_OTHER): Payer: Medicare Other | Admitting: Cardiovascular Disease

## 2012-12-01 ENCOUNTER — Encounter: Payer: Self-pay | Admitting: Cardiovascular Disease

## 2012-12-01 VITALS — BP 150/64 | HR 54 | Ht 67.0 in | Wt 179.0 lb

## 2012-12-01 DIAGNOSIS — E119 Type 2 diabetes mellitus without complications: Secondary | ICD-10-CM

## 2012-12-01 DIAGNOSIS — E7800 Pure hypercholesterolemia, unspecified: Secondary | ICD-10-CM

## 2012-12-01 DIAGNOSIS — I350 Nonrheumatic aortic (valve) stenosis: Secondary | ICD-10-CM | POA: Insufficient documentation

## 2012-12-01 DIAGNOSIS — R002 Palpitations: Secondary | ICD-10-CM

## 2012-12-01 DIAGNOSIS — I10 Essential (primary) hypertension: Secondary | ICD-10-CM

## 2012-12-01 DIAGNOSIS — I359 Nonrheumatic aortic valve disorder, unspecified: Secondary | ICD-10-CM

## 2012-12-01 DIAGNOSIS — I447 Left bundle-branch block, unspecified: Secondary | ICD-10-CM | POA: Insufficient documentation

## 2012-12-01 NOTE — Progress Notes (Signed)
Patient ID: Melvin [name], male   DOB: 02/13/1928, 79 y.o.   MRN: [id number]  HPI: Melvin [name], is a 79 y.o. male who presents for cardiology followup evaluation. This Wallace Gappa has a history of tracheal or ectopy and has been on beta blocker therapy since the late 1980s with ectopy suppression. In 1991 he underwent PTCA of his LAD. In October 1998 he underwent CABGs G. surgery x5 by Dr. Dorris Fetch (LIMA to LAD, sequential vein to the PDA and PLA, vein to the OM 1, vein to the second diagonal vessel). He has chronic left bundle branch block, and also has been followed for aortic valve stenosis with aortic insufficiency which previously has been in the mile high moderate range. He has peripheral vascular disease and underwent rotational atherectomy of his left popliteal and tibial trunk and angioplasty of focal popliteal lesions. In November 2013 his last echo showed an ejection fraction of 45-50% with diastolic dysfunction grade 1. He was felt to have mild to moderate aortic stenosis with mean gradient of 14, maximum gradient of 24, and valve area calculated at 1.2 cm with mild aortic insufficiency.  Yesterday, the patient states he was doing vacuuming and noticed an episode that lasted 3-4 hours of his heart beating hard  with palpitations. He denied associated presyncope or chest pressure. In December 2013 we had performed a CardioNet monitor which did show frequent ventricular ectopy without any episodes of ventricular tachycardia but he did have multifocal ectopic complexes. He presents today for evaluation.  Past Medical History  Diagnosis Date  . High cholesterol   . Unspecified hereditary and idiopathic peripheral neuropathy 10/01/2012  . RLS (restless legs syndrome) 10/01/2012  . Hypertension   . PVC (premature ventricular contraction)     on beta blocker/pt had cardio net monitor 06/23/2012  . PVC (premature ventricular contraction)     10 /20/1998    Past Surgical  History  Procedure Laterality Date  . Coronary artery bypass graft      04/1997  . Hiatal hernia surgery    . Appendectomy    . Doppler echocardiography  05/18/2012    Left  ventriculars systolic function  is mildly reduced. Ejection fraction =45-50 %       . Doppler echocardiography  03 02 2012    LVEF 50-55 % compared to prior echo in 02/2009, the EF apppears low normal with marked incoordinate septal  motion . The aortic  valve gradients have not significantly increased   . Stress test   02 07 2011    the post -stress ejection fraction is 54% Global left  venttricular systolic function function is normal . no Ekg changes  Nondiagnostic electrocardiogram    Allergies  Allergen Reactions  . Other     Patient is allergic to something but doesn't know the name.    Current Outpatient Prescriptions  Medication Sig Dispense Refill  . amLODipine-atorvastatin (CADUET) 5-40 MG per tablet       . aspirin 325 MG tablet Take 325 mg by mouth daily.      Marland Kitchen atenolol (TENORMIN) 50 MG tablet Take 75 mg by mouth 2 (two) times daily.       . clopidogrel (PLAVIX) 75 MG tablet Take 75 mg by mouth daily.      Marland Kitchen amLODipine-benazepril (LOTREL) 5-40 MG per capsule Take 1 capsule by mouth daily.      Marland Kitchen losartan (COZAAR) 50 MG tablet       . metformin (FORTAMET) 1000 MG (OSM)  24 hr tablet Take 1,000 mg by mouth 2 (two) times daily with a meal.      . multivitamin-iron-minerals-folic acid (CENTRUM) chewable tablet Chew 1 tablet by mouth daily.      . niacin (NIASPAN) 1000 MG CR tablet Take 1,500 mg by mouth at bedtime.      Marland Kitchen olmesartan (BENICAR) 5 MG tablet Take 10 mg by mouth daily.      Marland Kitchen torsemide (DEMADEX) 20 MG tablet Take 20 mg by mouth daily.      . zoledronic acid (RECLAST) 5 MG/100ML SOLN Inject 100 mLs (5 mg total) into the vein once.  100 mL  0   No current facility-administered medications for this visit.    Socially he is and has one child. Right had previously been ill. He does not  routinely exercise. There is no tobacco or alcohol use.  ROS is negative for fever chills or night sweats. He does admit to some mild shortness of and has noticed episodes of increased palpitations which he describes as his "heart pounding." He denies bleeding. He denies any progressive claudication symptoms. He denies paresthesias. He denies indigestion nausea or vomiting. Other system review is negative.  PE BP 150/64  Pulse 54  Ht 5\' 7"  (1.702 m)  Wt 179 lb (81.194 kg)  BMI 28.03 kg/m2  General: Alert, oriented, no distress.  HEENT: Normocephalic, atraumatic. Pupils round and reactive; sclera anicteric;  Nose without nasal septal hypertrophy Mouth/Parynx benign; Mallinpatti scale  2 Neck: No JVD, no carotid briuts Lungs: clear to ausculatation and percussion; no wheezing or rales Heart: RRR, s1 s2 normal 2/6 sem aortic area and LSB. Abdomen: soft, nontender; no hepatosplenomehaly, BS+; abdominal aorta nontender and not dilated by palpation. Pulses 2+ Extremities: trace ankle edema, Homan's sign negative  Neurologic: grossly nonfocal  ECG:  Sinus rhythm at 54 beats per minute with first degree AV block. PR interval 214 ms. Left bundle branch block with repolarization changes.  LABS:  BMET    Component Value Date/Time   NA 140 11/19/2011 1415   K 4.4 11/19/2011 1415   CL 105 11/19/2011 1415   CO2 24 02/06/2011 0545   GLUCOSE 99 11/19/2011 1415   BUN 22 11/19/2011 1415   CREATININE 0.80 11/19/2011 1415   CALCIUM 8.5 02/06/2011 0545   GFRNONAA >60 02/06/2011 0545   GFRAA >60 02/06/2011 0545     Hepatic Function Panel     Component Value Date/Time   PROT 6.4 09/16/2012 1215   ALBUMIN 4.3 09/16/2012 1215   AST 18 09/16/2012 1215   ALT 13 09/16/2012 1215   ALKPHOS 46 09/16/2012 1215   BILITOT 0.6 09/16/2012 1215   BILIDIR 0.1 09/16/2012 1215   IBILI 0.5 09/16/2012 1215     CBC    Component Value Date/Time   WBC 6.5 02/06/2011 0545   RBC 3.73* 02/06/2011 0545   HGB 13.9 11/19/2011 1415    HCT 41.0 11/19/2011 1415   PLT 110* 02/06/2011 0545   MCV 95.2 02/06/2011 0545   MCH 31.6 02/06/2011 0545   MCHC 33.2 02/06/2011 0545   RDW 12.9 02/06/2011 0545     BNP No results found for this basename: probnp    Lipid Panel  No results found for this basename: chol, trig, hdl, cholhdl, vldl, ldlcalc     RADIOLOGY: No results found.    ASSESSMENT AND PLAN: Is to, is now 16 years status post CABG surgery the his last nuclear study was in March 2013 which continued to show  fairly normal perfusion with the exception of a mild anteroseptal defect most likely related to his left bundle branch block. His last echo Doppler study was in November 2013. Presently, I am recommending further titration of his atenolol to 100 mg in the morning and he will continue to take 75 mg in the evening. At times he does note swelling but this has improved with torsemide. He did have trace edema today but had not taken his medication. In November 2014 I am recommending he undergo a one-year followup echo Doppler study to further evaluate his aortic valve stenosis. Lateral be checked prior to his next office visit which will be after his November echo Doppler assessment.    Lennette Bihari, MD, [facility]  12/01/2012 5:11 PM

## 2012-12-01 NOTE — Patient Instructions (Signed)
Your physician has requested that you have an echocardiogram. Echocardiography is a painless test that uses sound waves to create images of your heart. It provides your doctor with information about the size and shape of your heart and how well your heart's chambers and valves are working. This procedure takes approximately one hour. There are no restrictions for this procedure.  This test is scheduled for November 2014.  Your physician recommends that you return for lab work in: 6 months.  Your physician recommends that you schedule a follow-up appointment in: 6 months.

## 2012-12-03 ENCOUNTER — Ambulatory Visit (INDEPENDENT_AMBULATORY_CARE_PROVIDER_SITE_OTHER): Payer: Medicare Other | Admitting: Neurology

## 2012-12-03 ENCOUNTER — Encounter: Payer: Self-pay | Admitting: Neurology

## 2012-12-03 VITALS — BP 167/62 | HR 53 | Temp 98.1°F | Ht 67.75 in | Wt 181.0 lb

## 2012-12-03 DIAGNOSIS — G2581 Restless legs syndrome: Secondary | ICD-10-CM

## 2012-12-03 DIAGNOSIS — I739 Peripheral vascular disease, unspecified: Secondary | ICD-10-CM

## 2012-12-03 MED ORDER — GABAPENTIN ENACARBIL ER 300 MG PO TBCR: 300.0000 mg | EXTENDED_RELEASE_TABLET | Freq: Every day | ORAL | Status: DC

## 2012-12-03 NOTE — Progress Notes (Signed)
Subjective:    Patient ID: Melvin [name] is a 79 y.o. male.  HPI  Interim history:   Melvin [name] is a very pleasant 79 year old right-handed gentleman who presents for followup consultation of his left leg pain. He is accompanied by his wife today. I last saw him on 10/01/12 and prior to that on 09/01/2012 and first met him on 07/21/2012 at which time a suggested blood work and a trial of Requip for the possibility of atypical RLS affecting only his left leg. He also has lower back pain and radiating pain. He is an underlying history of peripheral vascular disease, heart disease, and diabetes. MRI lumbar spine in November 2012 did not show any structural lesions. He has had kidney cysts. 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 He has tried Neurontin. He has undergone epidural injections last year. Requip made him very groggy and he did not continue with it. However he did report no pain while he was taking Requip. At the last visit I suggested increasing the Mirapex to 2 pills at night as he reported in February that he actually found some relief with the medication. However he states today that he has not been able to tolerate Mirapex and that he felt, that his head was "going to explode", not so much in the sense of pain, but pressure sensation. After giving it a break for a few days, he re-started the medication and felt the same so he has eventually stop the medication altogether. His history is complicated by underlying sensory neuropathy, a history of peripheral vascular disease and peripheral edema as well as veins being harvested from both legs for his CABG. He still reports that he has to stop when driving longer distances and get out and walk around. At night he sometimes lifts his left leg and ankle out of bed to give it some relief. Sometimes he walks  around at night. Usually however he does sleep fairly well. Sometimes the pain in his left leg wakes him up from sleep.  His Past Medical History Is Significant For: Past Medical History  Diagnosis Date  . High cholesterol   . Unspecified hereditary and idiopathic peripheral neuropathy 10/01/2012  . RLS (restless legs syndrome) 10/01/2012  . Hypertension   . PVC (premature ventricular contraction)     on beta blocker/pt had cardio net monitor 06/23/2012  . PVC (premature ventricular contraction)     10 /20/1998    His Past Surgical History Is Significant For: Past Surgical History  Procedure Laterality Date  . Coronary artery bypass graft      04/1997  . Hiatal hernia surgery    . Appendectomy    . Doppler echocardiography  05/18/2012    Left  ventriculars systolic function  is mildly reduced. Ejection fraction =45-50 %       . Doppler echocardiography  03 02 2012    LVEF 50-55 % compared to prior echo in 02/2009, the EF apppears low normal with marked incoordinate septal  motion . The aortic  valve gradients have not significantly increased   . Stress test   02 07 2011    the post -stress ejection fraction is 54% Global left  venttricular systolic function function is normal . no Ekg changes  Nondiagnostic electrocardiogram    His Family History Is Significant For: History reviewed. No pertinent family history.  His  Social History Is Significant For: History   Social History  . Marital Status: Married    Spouse Name: Melvin [name]    Number of Children: 1  . Years of Education: College   Occupational History  .     Social History Main Topics  . Smoking status: Never Smoker   . Smokeless tobacco: Never Used  . Alcohol Use: No  . Drug Use: No  . Sexually Active: None   Other Topics Concern  . None   Social History Narrative   Pt lives at home with his spouse.   Has been married 11yrs.   Caffeine WUJ:[id number]    His Allergies Are:  Allergies  Allergen Reactions  .  Other     Patient is allergic to something but doesn't know the name.  . Pramipexole   :   His Current Medications Are:  Outpatient Encounter Prescriptions as of 12/03/2012  Medication Sig Dispense Refill  . amLODipine-atorvastatin (CADUET) 5-40 MG per tablet       . amLODipine-benazepril (LOTREL) 5-40 MG per capsule Take 1 capsule by mouth daily.      Marland Kitchen aspirin 325 MG tablet Take 325 mg by mouth daily.      Marland Kitchen atenolol (TENORMIN) 100 MG tablet Take 100 mg by mouth daily.      Marland Kitchen atenolol (TENORMIN) 50 MG tablet Take 75 mg by mouth 2 (two) times daily.       . clopidogrel (PLAVIX) 75 MG tablet Take 75 mg by mouth at bedtime.       Marland Kitchen losartan (COZAAR) 50 MG tablet Take 50 mg by mouth daily.       . metformin (FORTAMET) 1000 MG (OSM) 24 hr tablet Take 1,000 mg by mouth 2 (two) times daily with a meal.      . multivitamin-iron-minerals-folic acid (CENTRUM) chewable tablet Chew 1 tablet by mouth daily.      . niacin (NIASPAN) 1000 MG CR tablet Take 1,500 mg by mouth at bedtime.      Marland Kitchen olmesartan (BENICAR) 5 MG tablet Take 10 mg by mouth daily.      Marland Kitchen torsemide (DEMADEX) 20 MG tablet Take 20 mg by mouth 2 (two) times daily.       . zoledronic acid (RECLAST) 5 MG/100ML SOLN Inject 100 mLs (5 mg total) into the vein once.  100 mL  0  . Gabapentin Enacarbil ER (HORIZANT) 300 MG TB24 Take 300 mg by mouth at bedtime.  30 tablet  3   No facility-administered encounter medications on file as of 12/03/2012.   Review of Systems  Respiratory: Positive for shortness of breath.   Cardiovascular: Positive for palpitations and leg swelling.       Murmur  Musculoskeletal: Positive for arthralgias.    Objective:  Neurologic Exam  Physical Exam Physical Examination:   Filed Vitals:   12/03/12 1141  BP: 167/62  Pulse: 53  Temp: 98.1 F (36.7 C)   General Examination: The patient is a very pleasant 79 y.o. male in no acute distress.   HEENT exam: Normocephalic, atraumatic, face is symmetric with  normal facial animation noted. Neck is supple with full range of motion. Hearing is grossly intact. Pupils are equal, round and reactive to light and accommodation. Extraocular tracking is good without nystagmus. Speech is clear. Airway examination reveals mild mouth dryness and normal airway findings.  Chest is clear to auscultation,  Heart sounds are normal with the exception of a 2/6-3/6 systolic murmur audible, no change from last  times. The patient states he has been told that he has a murmur and leaky valves.  Abdomen soft, nontender with positive bowel sounds.  Skin examination reveals skin to be warm and dry. There are hyperpigmentations distal lower extremities and scars from prior vein harvesting. He has 2+ pitting edema in the distal lower extremities to his mid shin areas bilaterally. No change from last time.  Neurologically: Mental status: The patient is awake, alert and oriented in all 3 spheres. His memory, attention, language and knowledge are appropriate. Cranial nerves are as described under HEENT exam. Motor-wise he has normal bulk strength and tone throughout with the exception of 4+ out of 5 weakness in his left hip flexor, unchanged. Reflexes are 2+ in the upper extremities trace in both knees and absent in both ankles. Sensory exam reveals decreased sensation to vibration, pinprick and temperature sense in both distal lower extremities left worse than right. On the left his sensory loss affects the area above the left ankle to mid shin, on the right really only affects the right foot to above ankle. He has normal sensation in his hands and face. He has no drift and no tremor. Romberg testing reveals mild swaying but no corrective steps. His gait, station and balance are unremarkable for age.   Assessment and Plan:   In summary, Mr. Scheffel is a 88 yo gentleman with a 2 year history of L leg pain. I think this is a combination of things: He has peripheral neuropathy, PVD, chronic  leg edema and most likely also atypical RLS. He was not able to tolerate Requip or Mirapex and today I suggested a trial of Horizant, starting at 300 mg at bedtime. I notified him and his wife about potential side effects with this medication. Previous electrodiagnostic data suggesting demyelinating and axonal neuropathy in the left lower extremity. While he is diabetic, his presentation is not very typical for diabetic peripheral neuropathy. His exam shows good strength with the exception of mild left hip flexor weakness and absence of ankle jerks and trace knee jerks, bilaterally. He has sensory neuropathy affecting both lower extremities, L > R, and he has PVD, veins harvested from both legs, diabetes and peripheral edema. His exam has essentially remained unchanged. I would like to see him back in 3 months from now, sooner if the need arises and asked him to call us with any interim questions, concerns, problems, updates or refill requests. He and his wife were in agreement.

## 2012-12-03 NOTE — Patient Instructions (Signed)
I will try you on a new medication called Horizant for your leg pain. Take one pill at bedtime. Follow up in 3 months, call with questions.

## 2012-12-31 ENCOUNTER — Telehealth: Payer: Self-pay | Admitting: Cardiovascular Disease

## 2012-12-31 MED ORDER — ATENOLOL 50 MG PO TABS: ORAL_TABLET | ORAL | Status: DC

## 2012-12-31 MED ORDER — TORSEMIDE 20 MG PO TABS: 20.0000 mg | ORAL_TABLET | Freq: Two times a day (BID) | ORAL | Status: DC

## 2012-12-31 NOTE — Telephone Encounter (Signed)
Returned call.  Pt stated atenolol instructions were changed at his last visit and he needs a new prescription sent to [facility].  Reviewed chart and dose was changed to 2 tabs (100mg ) in AM and 1.5 tabs (75mg ) in PM.  Pt also requested refill on torsemide 20 mg BID.  Refill(s) sent to pharmacy.  Pt aware.

## 2012-12-31 NOTE — Telephone Encounter (Signed)
Melvin [name] is calling because [facility] is asking for [place] to clarify the  quantity on his Atenolol-50mg  .. He takes 2 tablets in the morning and 1 and a half pill at night    Thanks

## 2013-02-25 ENCOUNTER — Telehealth (HOSPITAL_COMMUNITY): Payer: Self-pay | Admitting: Cardiovascular Disease

## 2013-03-01 ENCOUNTER — Encounter (INDEPENDENT_AMBULATORY_CARE_PROVIDER_SITE_OTHER): Payer: Self-pay | Admitting: *Deleted

## 2013-03-09 ENCOUNTER — Ambulatory Visit (INDEPENDENT_AMBULATORY_CARE_PROVIDER_SITE_OTHER): Payer: Medicare Other | Admitting: Neurology

## 2013-03-09 ENCOUNTER — Encounter: Payer: Self-pay | Admitting: Neurology

## 2013-03-09 VITALS — BP 162/68 | HR 54 | Temp 98.4°F | Ht 67.25 in | Wt 180.0 lb

## 2013-03-09 DIAGNOSIS — G2581 Restless legs syndrome: Secondary | ICD-10-CM

## 2013-03-09 DIAGNOSIS — G609 Hereditary and idiopathic neuropathy, unspecified: Secondary | ICD-10-CM

## 2013-03-09 DIAGNOSIS — R609 Edema, unspecified: Secondary | ICD-10-CM

## 2013-03-09 DIAGNOSIS — I739 Peripheral vascular disease, unspecified: Secondary | ICD-10-CM

## 2013-03-09 MED ORDER — GABAPENTIN ENACARBIL ER 300 MG PO TBCR: 300.0000 mg | EXTENDED_RELEASE_TABLET | Freq: Every day | ORAL | Status: DC

## 2013-03-09 NOTE — Patient Instructions (Addendum)
I think overall you are doing fairly well but I do want to suggest a few things today:  Remember to drink plenty of fluid, eat healthy meals and do not skip any meals. Try to eat protein with a every meal and eat a healthy snack such as fruit or nuts in between meals. Try to keep a regular sleep-wake schedule and try to exercise daily, particularly in the form of walking, 20-30 minutes a day, if you can.   As far as your medications are concerned, I would like to suggest perhaps using the Horizant 300 mg every other day, to stretch the prescription due to its high cost.   As far as diagnostic testing: no new test at this time.  I would like to see you back in 6 months, sooner if we need to. Please call us with any interim questions, concerns, problems, updates or refill requests.  Brett Canales is my clinical assistant and will answer any of your questions and relay your messages to me and also relay most of my messages to you.  Our phone number is [phone]. We also have an after hours call service for urgent matters and there is a physician on-call for urgent questions. For any emergencies you know to call 911 or go to the nearest emergency room.

## 2013-03-09 NOTE — Progress Notes (Signed)
Subjective:    Patient ID: Melvin [name] is a 79 y.o. male.  HPI  Interim history:   Melvin [name] is a very pleasant 79 year old right-handed gentleman who presents for followup consultation of his left leg pain, possible atypical RLS. I last saw him on 12/03/12, at which time he reported side effects from Mirapex even though it had actually helped his left leg pain. He stopped Mirapex and gave it a few days to get a break and restarted it and had the same side effects which included a feeling that his 'head was going to explode'. On 09/01/2012 he reported side effects with a Requip. I first met him on 07/21/2012 at which time a suggested blood work and a trial of Requip for the possibility of atypical RLS affecting only his left leg. He also has lower back pain and radiating pain. He is an underlying history of peripheral vascular disease, heart disease, and diabetes. MRI lumbar spine in November 2012 did not show any structural lesions. He has had kidney cysts. 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 He has tried Neurontin. He has undergone epidural injections last year. Requip made him very groggy and he did not continue with it. However he did report no pain while he was taking Requip. I suggested a trial of physical therapy but did not refer him at the time. I first wanted to see him back after starting Mirapex. At our last visit in 5/14 I suggested a trial of Horizant. He reports that he has had improvement in his night time pain and he sleeps well at night, but at times, he feels he takes longer to feel fully awake. He is worried about the cost of the medicine, which costs him over $100 per month. He has noted no other side effects but for the mild groggy feeling in the morning. He takes the medicine at bedtime and while it makes him tired, he does not become  very sleepy very fast after taking it.   His Past Medical History Is Significant For: Past Medical History  Diagnosis Date  . High cholesterol   . Unspecified hereditary and idiopathic peripheral neuropathy 10/01/2012  . RLS (restless legs syndrome) 10/01/2012  . Hypertension   . PVC (premature ventricular contraction)     on beta blocker/pt had cardio net monitor 06/23/2012  . PVC (premature ventricular contraction)     10 /20/1998    His Past Surgical History Is Significant For: Past Surgical History  Procedure Laterality Date  . Coronary artery bypass graft      04/1997  . Hiatal hernia surgery    . Appendectomy    . Doppler echocardiography  05/18/2012    Left  ventriculars systolic function  is mildly reduced. Ejection fraction =45-50 %       . Doppler echocardiography  03 02 2012    LVEF 50-55 % compared to prior echo in 02/2009, the EF apppears low normal with marked incoordinate septal  motion . The aortic  valve gradients have not significantly increased   . Stress test   02 07 2011    the post -stress ejection fraction is 54% Global left  venttricular systolic function function is normal . no Ekg changes  Nondiagnostic electrocardiogram    His Family History Is Significant For: History reviewed. No pertinent family history.  His Social History Is Significant For:  History   Social History  . Marital Status: Married    Spouse Name: Melvin [name]    Number of Children: 1  . Years of Education: College   Occupational History  .     Social History Main Topics  . Smoking status: Never Smoker   . Smokeless tobacco: Never Used  . Alcohol Use: No  . Drug Use: No  . Sexual Activity: None   Other Topics Concern  . None   Social History Narrative   Pt lives at home with his spouse.   Has been married 89yrs.   Caffeine ONG:EXBMWU    His Allergies Are:  Allergies  Allergen Reactions  . Other     Patient is allergic to something but doesn't know the name.  .  Pramipexole   :   His Current Medications Are:  Outpatient Encounter Prescriptions as of 03/09/2013  Medication Sig Dispense Refill  . amLODipine-atorvastatin (CADUET) 5-40 MG per tablet       . amLODipine-benazepril (LOTREL) 5-40 MG per capsule Take 1 capsule by mouth daily.      Marland Kitchen amoxicillin (AMOXIL) 500 MG capsule       . aspirin 325 MG tablet Take 325 mg by mouth daily.      Marland Kitchen atenolol (TENORMIN) 50 MG tablet Take two tabs (100mg ) in AM and one & half (75mg ) in PM  315 tablet  3  . clopidogrel (PLAVIX) 75 MG tablet Take 75 mg by mouth at bedtime.       . Gabapentin Enacarbil ER (HORIZANT) 300 MG TB24 Take by mouth.      . losartan (COZAAR) 50 MG tablet Take 50 mg by mouth daily.       . metformin (FORTAMET) 1000 MG (OSM) 24 hr tablet Take 1,000 mg by mouth 2 (two) times daily with a meal.      . multivitamin-iron-minerals-folic acid (CENTRUM) chewable tablet Chew 1 tablet by mouth daily.      . niacin (NIASPAN) 1000 MG CR tablet Take 1,500 mg by mouth at bedtime.      Marland Kitchen NITROSTAT 0.4 MG SL tablet       . olmesartan (BENICAR) 5 MG tablet Take 10 mg by mouth daily.      Marland Kitchen torsemide (DEMADEX) 20 MG tablet Take 1 tablet (20 mg total) by mouth 2 (two) times daily.  180 tablet  3  . zoledronic acid (RECLAST) 5 MG/100ML SOLN Inject 100 mLs (5 mg total) into the vein once.  100 mL  0  . [DISCONTINUED] Gabapentin Enacarbil ER (HORIZANT) 300 MG TB24 Take 300 mg by mouth at bedtime.  30 tablet  3   No facility-administered encounter medications on file as of 03/09/2013.    Review of Systems  Respiratory: Positive for shortness of breath.   Musculoskeletal:       Joint pain    Objective:  Neurologic Exam  Physical Exam Physical Examination:   Filed Vitals:   03/09/13 1205  BP: 162/68  Pulse: 54  Temp: 98.4 F (36.9 C)    General Examination: The patient is a very pleasant 79 y.o. male in no acute distress.   HEENT exam: Normocephalic, atraumatic, face is symmetric with normal  facial animation noted. Neck is supple with full range of motion. Hearing is grossly intact. Pupils are equal, round and reactive to light and accommodation. Extraocular tracking is good without nystagmus. Speech is clear. Airway examination reveals mild mouth dryness and normal airway findings.  Chest is clear to auscultation,  with no wheezing, crackles or rhonchi noted.  Heart sounds are normal with the exception of a 2/6-3/6 systolic murmur audible, unchanged (the patient states he has been told that he has a murmur and leaky valves).  Abdomen soft, nontender with positive bowel sounds.  Skin examination reveals skin to be warm and dry. There are hyperpigmentations distal lower extremities and scars from prior vein harvesting. He has 2+ pitting edema in the distal lower extremities to his mid shin areas bilaterally. No change from last time.  Neurologically: Mental status: The patient is awake, alert and oriented in all 3 spheres. His memory, attention, language and knowledge are appropriate. Cranial nerves are as described under HEENT exam. Motor-wise he has normal bulk strength and tone throughout with the exception of 4+ out of 5 weakness in his left hip flexor, unchanged. Reflexes are 2+ in the upper extremities trace in both knees and absent in both ankles. Sensory exam reveals decreased sensation to vibration, pinprick and temperature sense in both distal lower extremities left worse than right. On the left his sensory loss affects the area above the left ankle to mid shin, on the right really only affects the right foot to above ankle. He has normal sensation in his hands and face. He has no drift and no tremor. Romberg testing reveals mild swaying but no corrective steps. His gait, station and balance are unremarkable for age.  Assessment and Plan:   In summary, Melvin [name] is a 11 yo gentleman with a 2 year history of L leg pain. I think this is d/t a combination of things: peripheral  neuropathy, PVD, chronic leg edema and I still entertain the Dx of atypical RLS. He was not able to tolerate Requip or Mirapex, but had improvement in his pain with both DAs. Horizant, which I started in 5/14, starting at 300 mg at bedtime has been somewhat helpful, but he has mild morning grogginess and he still has some residual daytime Sx. Interestingly, he told me today, that he was able to drive to this appointment without having to stop for leg pain, which is unusual for him. I told him that we have to decide whether the cost is worth the improvement he has noted. I suggested, that he perhaps try to take it qod for now and see how it goes. Unfortunately, there is probably not much more I can do for this nice gentleman. Previous electrodiagnostic data suggesting demyelinating and axonal neuropathy in the left lower extremity. While he is diabetic, his presentation is not very typical for diabetic peripheral neuropathy. His exam shows good strength with the exception of mild left hip flexor weakness and absence of ankle jerks and trace knee jerks, bilaterally. He has sensory neuropathy affecting both lower extremities, L > R, and he has PVD, veins harvested from both legs, diabetes and peripheral edema. His exam has essentially remained unchanged. I would like to see him back in 6 months from now, sooner if the need arises and asked him to call us with any interim questions, concerns, problems, updates or refill requests. He and his wife were in agreement.

## 2013-03-15 ENCOUNTER — Telehealth (HOSPITAL_COMMUNITY): Payer: Self-pay | Admitting: Cardiovascular Disease

## 2013-04-15 ENCOUNTER — Other Ambulatory Visit (INDEPENDENT_AMBULATORY_CARE_PROVIDER_SITE_OTHER): Payer: Self-pay | Admitting: Internal Medicine

## 2013-04-15 DIAGNOSIS — K769 Liver disease, unspecified: Secondary | ICD-10-CM

## 2013-04-21 ENCOUNTER — Ambulatory Visit (HOSPITAL_COMMUNITY)
Admission: RE | Admit: 2013-04-21 | Discharge: 2013-04-21 | Disposition: A | Discharge status: Home / Self Care | Payer: Medicare Other | Source: Ambulatory Visit | Attending: Internal Medicine | Admitting: Internal Medicine

## 2013-04-21 DIAGNOSIS — K7689 Other specified diseases of liver: Secondary | ICD-10-CM | POA: Insufficient documentation

## 2013-04-21 DIAGNOSIS — N289 Disorder of kidney and ureter, unspecified: Secondary | ICD-10-CM | POA: Insufficient documentation

## 2013-04-21 DIAGNOSIS — K769 Liver disease, unspecified: Secondary | ICD-10-CM

## 2013-04-30 ENCOUNTER — Other Ambulatory Visit (HOSPITAL_COMMUNITY): Payer: Self-pay | Admitting: Cardiovascular Disease

## 2013-04-30 DIAGNOSIS — I739 Peripheral vascular disease, unspecified: Secondary | ICD-10-CM

## 2013-05-06 ENCOUNTER — Ambulatory Visit (HOSPITAL_COMMUNITY)
Admission: RE | Admit: 2013-05-06 | Discharge: 2013-05-06 | Disposition: A | Discharge status: Home / Self Care | Payer: Medicare Other | Source: Ambulatory Visit | Attending: Cardiovascular Disease | Admitting: Cardiovascular Disease

## 2013-05-06 DIAGNOSIS — I739 Peripheral vascular disease, unspecified: Secondary | ICD-10-CM

## 2013-05-06 DIAGNOSIS — I70219 Atherosclerosis of native arteries of extremities with intermittent claudication, unspecified extremity: Secondary | ICD-10-CM

## 2013-05-06 NOTE — Progress Notes (Signed)
Arterial Lower Ext. Duplex Completed. Naly Schwanz, BS, RDMS, RVT  

## 2013-05-16 ENCOUNTER — Telehealth: Payer: Self-pay | Admitting: *Deleted

## 2013-05-16 ENCOUNTER — Encounter: Payer: Self-pay | Admitting: *Deleted

## 2013-05-16 DIAGNOSIS — I739 Peripheral vascular disease, unspecified: Secondary | ICD-10-CM

## 2013-05-16 NOTE — Telephone Encounter (Signed)
Order placed for repeat lower ext dopplers in 6  months 

## 2013-05-16 NOTE — Telephone Encounter (Signed)
Message copied by Marella Bile on Sun May 16, 2013  7:32 PM ------      Message from: Runell Gess      Created: Thu May 13, 2013  1:23 PM       No change from prior study. Repeat in 6 months ------

## 2013-05-19 ENCOUNTER — Telehealth (HOSPITAL_COMMUNITY): Payer: Self-pay | Admitting: *Deleted

## 2013-06-08 ENCOUNTER — Telehealth: Payer: Self-pay | Admitting: *Deleted

## 2013-06-08 ENCOUNTER — Other Ambulatory Visit: Payer: Self-pay | Admitting: *Deleted

## 2013-06-08 DIAGNOSIS — I350 Nonrheumatic aortic (valve) stenosis: Secondary | ICD-10-CM

## 2013-06-10 ENCOUNTER — Ambulatory Visit (HOSPITAL_COMMUNITY)
Admission: RE | Admit: 2013-06-10 | Discharge: 2013-06-10 | Disposition: A | Discharge status: Home / Self Care | Payer: Medicare Other | Source: Ambulatory Visit | Attending: Cardiovascular Disease | Admitting: Cardiovascular Disease

## 2013-06-10 DIAGNOSIS — I350 Nonrheumatic aortic (valve) stenosis: Secondary | ICD-10-CM

## 2013-06-10 DIAGNOSIS — I359 Nonrheumatic aortic valve disorder, unspecified: Secondary | ICD-10-CM

## 2013-06-10 DIAGNOSIS — E785 Hyperlipidemia, unspecified: Secondary | ICD-10-CM | POA: Insufficient documentation

## 2013-06-10 DIAGNOSIS — E119 Type 2 diabetes mellitus without complications: Secondary | ICD-10-CM | POA: Insufficient documentation

## 2013-06-10 DIAGNOSIS — I10 Essential (primary) hypertension: Secondary | ICD-10-CM | POA: Insufficient documentation

## 2013-06-10 NOTE — Progress Notes (Signed)
2D Echo Performed 06/10/2013    Sheila Ocasio, RCS  

## 2013-06-16 ENCOUNTER — Ambulatory Visit: Payer: Medicare Other | Admitting: Cardiovascular Disease

## 2013-06-22 ENCOUNTER — Encounter: Payer: Self-pay | Admitting: Cardiovascular Disease

## 2013-06-22 ENCOUNTER — Ambulatory Visit (INDEPENDENT_AMBULATORY_CARE_PROVIDER_SITE_OTHER): Payer: Medicare Other | Admitting: Cardiovascular Disease

## 2013-06-22 VITALS — BP 180/66 | HR 63 | Ht 67.5 in | Wt 175.9 lb

## 2013-06-22 DIAGNOSIS — I359 Nonrheumatic aortic valve disorder, unspecified: Secondary | ICD-10-CM

## 2013-06-22 DIAGNOSIS — R600 Localized edema: Secondary | ICD-10-CM

## 2013-06-22 DIAGNOSIS — R609 Edema, unspecified: Secondary | ICD-10-CM

## 2013-06-22 DIAGNOSIS — I10 Essential (primary) hypertension: Secondary | ICD-10-CM

## 2013-06-22 DIAGNOSIS — I350 Nonrheumatic aortic (valve) stenosis: Secondary | ICD-10-CM

## 2013-06-22 DIAGNOSIS — I447 Left bundle-branch block, unspecified: Secondary | ICD-10-CM

## 2013-06-22 DIAGNOSIS — R002 Palpitations: Secondary | ICD-10-CM

## 2013-06-22 MED ORDER — AMLODIPINE BESY-BENAZEPRIL HCL 5-40 MG PO CAPS: 1.0000 | ORAL_CAPSULE | Freq: Every day | ORAL | Status: DC

## 2013-06-22 NOTE — Progress Notes (Signed)
Patient ID: Melvin [name], male   DOB: 31-Jul-1927, 79 y.o.   MRN: [id number]    HPI: Melvin [name], is a 79 y.o. male who presents for 6 month cardiology followup evaluation.  Mr. Akram Kissick has a history of ventricular ectopy and has been on beta blocker therapy since the late 1980s with ectopy suppression. In 1991 he underwent PTCA of his LAD. In October 1998 he underwent CABG surgery x5 by Dr. Dorris Fetch (LIMA to LAD, sequential vein to the PDA and PLA, vein to the OM 1, vein to the second diagonal vessel). He has chronic left bundle branch block, and also has been followed for aortic valve stenosis with aortic insufficiency which previously has been in the mild to moderate range. He has peripheral vascular disease and underwent rotational atherectomy of his left popliteal and tibial trunk and angioplasty of focal popliteal lesions. In November 2013 his last echo showed an ejection fraction of 45-50% with diastolic dysfunction grade 1. He was felt to have mild to moderate aortic stenosis with mean gradient of 14, maximum gradient of 24, and valve area calculated at 1.2 cm with mild aortic insufficiency.  In December 2013 he had worn a CardioNet monitor which did show frequent ventricular ectopy without any episodes of ventricular tachycardia but he did have multifocal ectopic complexes. Past 6 months, he has been under significant stress in caring for his wife who has has significant health issues. Past month she's now been in the nursing or at some of the stress has been relieved. He does note decreased energy. He is sleeping well. At times she does note a rare palpitation. He denies any exertionally precipitated chest pain. At times he notes mild shortness of breath which is nonexertional.  Past Medical History  Diagnosis Date  . High cholesterol   . Unspecified hereditary and idiopathic peripheral neuropathy 10/01/2012  . RLS (restless legs syndrome) 10/01/2012  . Hypertension     . PVC (premature ventricular contraction)     on beta blocker/pt had cardio net monitor 06/23/2012  . PVC (premature ventricular contraction)     10 /20/1998    Past Surgical History  Procedure Laterality Date  . Coronary artery bypass graft      04/1997  . Hiatal hernia surgery    . Appendectomy    . Doppler echocardiography  05/18/2012    Left  ventriculars systolic function  is mildly reduced. Ejection fraction =45-50 %       . Doppler echocardiography  03 02 2012    LVEF 50-55 % compared to prior echo in 02/2009, the EF apppears low normal with marked incoordinate septal  motion . The aortic  valve gradients have not significantly increased   . Stress test   02 07 2011    the post -stress ejection fraction is 54% Global left  venttricular systolic function function is normal . no Ekg changes  Nondiagnostic electrocardiogram    Allergies  Allergen Reactions  . Other     Patient is allergic to something but doesn't know the name.  . Pramipexole     Current Outpatient Prescriptions  Medication Sig Dispense Refill  . amoxicillin (AMOXIL) 500 MG capsule       . aspirin 325 MG tablet Take 325 mg by mouth daily.      Marland Kitchen atenolol (TENORMIN) 50 MG tablet Take two tabs (100mg ) in AM and one & half (75mg ) in PM  315 tablet  3  . Calcium Carbonate-Vit D-Min (CALTRATE PLUS  PO) Take 1 tablet by mouth daily.      . clopidogrel (PLAVIX) 75 MG tablet Take 75 mg by mouth at bedtime.       . Gabapentin Enacarbil ER (HORIZANT) 300 MG TB24 Take 300 mg by mouth daily with supper.  30 tablet  5  . metformin (FORTAMET) 1000 MG (OSM) 24 hr tablet Take 1,000 mg by mouth 2 (two) times daily with a meal.      . Multiple Vitamins-Minerals (PRESERVISION AREDS 2 PO) Take 2 capsules by mouth once.      . multivitamin-iron-minerals-folic acid (CENTRUM) chewable tablet Chew 1 tablet by mouth daily.      . niacin (NIASPAN) 1000 MG CR tablet Take 1,500 mg by mouth at bedtime.      Marland Kitchen NITROSTAT 0.4 MG SL tablet        . torsemide (DEMADEX) 20 MG tablet Take 1 tablet (20 mg total) by mouth 2 (two) times daily.  180 tablet  3  . zoledronic acid (RECLAST) 5 MG/100ML SOLN Inject 100 mLs (5 mg total) into the vein once.  100 mL  0  . amLODipine-benazepril (LOTREL) 5-40 MG per capsule Take 1 capsule by mouth daily.       No current facility-administered medications for this visit.    Socially he is and has one child.  He does not routinely exercise. There is no tobacco or alcohol use.  ROS is negative for fever chills or night sweats. He denies skin rash. He does were glasses. He denies change in vision symptoms. There is no change in hearing. He is unaware of lymphadenopathy. He does admit to some mild shortness of and notes rare palpitations which have improved with home stress reduction. He denies bleeding. He denies any progressive claudication symptoms. He denies paresthesias. He denies indigestion nausea or vomiting. He is unaware of any blood in stool or urine.  There is no diabetes. He denies cold or heat intolerance. He denies difficulty with sleep. Other comprehensive 14 point system review is negative.  PE BP 180/66  Pulse 63  Ht 5' 7.5" (1.715 m)  Wt 175 lb 14.4 oz (79.788 kg)  BMI 27.13 kg/m2  Repeat blood pressure by me was 156/80. He did not take his torsemide today. General: Alert, oriented, no distress.  HEENT: Normocephalic, atraumatic. Pupils round and reactive; sclera anicteric;  Nose without nasal septal hypertrophy Mouth/Parynx benign; Mallinpatti scale  2 Neck: No JVD, no carotid briuts Chest: No tenderness to palpation Lungs: clear to ausculatation and percussion; no wheezing or rales Heart: RRR, s1 s2 normal 2/6 sem aortic area and LSB. Abdomen: soft, nontender; no hepatosplenomehaly, BS+; abdominal aorta nontender and not dilated by palpation. Back: No CVA Pulses 2+ Extremities: 1-2+ ankle edema, Homan's sign negative  Neurologic: grossly nonfocal Psychologic: Normal  affect and mood  ECG:  Sinus rhythm at 63 beats per minute with resolution of prior first degree heart block. Left bundle branch block with repolarization changes.  LABS:  BMET    Component Value Date/Time   NA 140 11/19/2011 1415   K 4.4 11/19/2011 1415   CL 105 11/19/2011 1415   CO2 24 02/06/2011 0545   GLUCOSE 99 11/19/2011 1415   BUN 22 11/19/2011 1415   CREATININE 0.80 11/19/2011 1415   CALCIUM 8.5 02/06/2011 0545   GFRNONAA >60 02/06/2011 0545   GFRAA >60 02/06/2011 0545     Hepatic Function Panel     Component Value Date/Time   PROT 6.4 09/16/2012 1215  ALBUMIN 4.3 09/16/2012 1215   AST 18 09/16/2012 1215   ALT 13 09/16/2012 1215   ALKPHOS 46 09/16/2012 1215   BILITOT 0.6 09/16/2012 1215   BILIDIR 0.1 09/16/2012 1215   IBILI 0.5 09/16/2012 1215     CBC    Component Value Date/Time   WBC 6.5 02/06/2011 0545   RBC 3.73* 02/06/2011 0545   HGB 13.9 11/19/2011 1415   HCT 41.0 11/19/2011 1415   PLT 110* 02/06/2011 0545   MCV 95.2 02/06/2011 0545   MCH 31.6 02/06/2011 0545   MCHC 33.2 02/06/2011 0545   RDW 12.9 02/06/2011 0545     BNP No results found for this basename: probnp    Lipid Panel  No results found for this basename: chol,  trig,  hdl,  cholhdl,  vldl,  ldlcalc     RADIOLOGY: No results found.    ASSESSMENT AND PLAN:   Mr. Carton is now 16 years status post CABG surgery.  His last nuclear study was in March 2013 which continued to show fairly normal perfusion with the exception of a mild anteroseptal defect most likely related to his left bundle branch block. His last echo Doppler study was in November 2013 which did suggest mild/moderate aortic stenosis. He does have 1-2+ lower [place] edema today and his blood pressures elevated which may be contributed by the fact that he did not take his torsemide this morning since he was [place] here [place]. I instructed that he should take the torsemide as soon as he gets back home. He is not having any significant recent  palpitations and these have improved with his increased beta blocker regimen which now is 100 mg in the morning and 75 mg at night atenolol. In 6 months, recommend he undergo an 18 month followup echo Doppler study to reassess systolic and diastolic function as well as his aortic valve disease. I'll see him in the office in followup and further recommendations were made at that time.  Lennette Bihari, MD, [facility]  06/22/2013 12:29 PM

## 2013-06-22 NOTE — Patient Instructions (Signed)
Your physician has requested that you have an echocardiogram. Echocardiography is a painless test that uses sound waves to create images of your heart. It provides your doctor with information about the size and shape of your heart and how well your heart's chambers and valves are working. This procedure takes approximately one hour. There are no restrictions for this procedure. THIS WILL BE SCHEDULED FOR JUNE 2015  Your physician recommends that you schedule a follow-up appointment in: 6 MONTHS

## 2013-06-24 ENCOUNTER — Other Ambulatory Visit: Payer: Medicare Other

## 2013-06-24 ENCOUNTER — Other Ambulatory Visit (INDEPENDENT_AMBULATORY_CARE_PROVIDER_SITE_OTHER): Payer: Medicare Other

## 2013-06-24 ENCOUNTER — Ambulatory Visit (INDEPENDENT_AMBULATORY_CARE_PROVIDER_SITE_OTHER): Payer: Medicare Other | Admitting: Endocrinology

## 2013-06-24 ENCOUNTER — Other Ambulatory Visit: Payer: Self-pay | Admitting: *Deleted

## 2013-06-24 ENCOUNTER — Encounter: Payer: Self-pay | Admitting: Endocrinology

## 2013-06-24 VITALS — BP 150/70 | HR 68 | Temp 98.4°F | Resp 12 | Ht 67.0 in | Wt 177.8 lb

## 2013-06-24 DIAGNOSIS — M810 Age-related osteoporosis without current pathological fracture: Secondary | ICD-10-CM

## 2013-06-24 DIAGNOSIS — IMO0001 Reserved for inherently not codable concepts without codable children: Secondary | ICD-10-CM

## 2013-06-24 LAB — BASIC METABOLIC PANEL
BUN: 21 mg/dL (ref 6–23)
CO2: 35 mEq/L — ABNORMAL HIGH (ref 19–32)
Calcium: 10.1 mg/dL (ref 8.4–10.5)
Glucose, Bld: 102 mg/dL — ABNORMAL HIGH (ref 70–99)
Potassium: 4.7 mEq/L (ref 3.5–5.1)
Sodium: 139 mEq/L (ref 135–145)

## 2013-06-24 NOTE — Progress Notes (Signed)
Quick Note:  Please let patient know that the lab result is normal and no further action needed, glucose 102 ______

## 2013-06-24 NOTE — Progress Notes (Signed)
Patient ID: Melvin [name], male   DOB: [birth year], 79 y.o.   MRN: [id number]  Melvin [name] is a 79 y.o. male.   Chief complaint: Low back pain  History of Present Illness:   He has had low back pain for several years. His bones appeared osteopenic on x-rays in 2004 and the bone density initially showed T score of -3.0 and the spine and -2.7 at the hips.  No history of any fractures and none found on x-rays. Initially tried on Fosamax which he could not tolerate In 2005 was given Forteo for 2 years because of some decline in lumbar bone density while taking Actonel and subsequently back on Actonel in 2007. Bone density improved significantly with Forteo He has been given Reclast infusions since 2011 which he has tolerated; last infusion was in 08/2011  With above treatments his last bone density had improved and subsequently stabilized in 2013 with T score -2.4 at the spine and -1.9 at the hip His height last year was about the same at 5 feet 7 inches Vitamin D level has been adequate; did have high normal calcium without increased PTH previously  He continues to have low back pain but has had significant degenerative disc disease and osteoarthritis also.    Medication List       This list is accurate as of: 06/24/13 11:59 PM.  Always use your most recent med list.               amLODipine-benazepril 5-40 MG per capsule  Commonly known as:  LOTREL  Take 1 capsule by mouth daily.     amoxicillin 500 MG capsule  Commonly known as:  AMOXIL     aspirin 325 MG tablet  Take 325 mg by mouth daily.     atenolol 50 MG tablet  Commonly known as:  TENORMIN  Take two tabs (100mg ) in AM and one & half (75mg ) in PM     atorvastatin 40 MG tablet  Commonly known as:  LIPITOR  Take 40 mg by mouth daily.     CALTRATE 600 PLUS-VIT D PO  Take 1 tablet by mouth daily.     clopidogrel 75 MG tablet  Commonly known as:  PLAVIX  Take 75 mg by mouth at bedtime.     Gabapentin  Enacarbil ER 300 MG Tbcr  Commonly known as:  HORIZANT  Take 300 mg by mouth daily with supper.     losartan 50 MG tablet  Commonly known as:  COZAAR     metformin 1000 MG (OSM) 24 hr tablet  Commonly known as:  FORTAMET  Take 1,000 mg by mouth 2 (two) times daily with a meal.     multivitamin-iron-minerals-folic acid chewable tablet  Chew 1 tablet by mouth daily.     PRESERVISION AREDS 2 PO  Take 2 capsules by mouth once.     niacin 1000 MG CR tablet  Commonly known as:  NIASPAN  Take 1,500 mg by mouth at bedtime.     NITROSTAT 0.4 MG SL tablet  Generic drug:  nitroGLYCERIN     polycarbophil 625 MG tablet  Commonly known as:  FIBERCON  Take 625 mg by mouth daily.     torsemide 20 MG tablet  Commonly known as:  DEMADEX  Take 1 tablet (20 mg total) by mouth 2 (two) times daily.     zoledronic acid 5 MG/100ML Soln injection  Commonly known as:  RECLAST  Inject 100 mLs (5 mg total)  into the vein once.        Allergies:  Allergies  Allergen Reactions  . Other     Patient is allergic to something but doesn't know the name.  . Pramipexole     Past Medical History  Diagnosis Date  . High cholesterol   . Unspecified hereditary and idiopathic peripheral neuropathy 10/01/2012  . RLS (restless legs syndrome) 10/01/2012  . Hypertension   . PVC (premature ventricular contraction)     on beta blocker/pt had cardio net monitor 06/23/2012  . PVC (premature ventricular contraction)     10 /20/1998    Past Surgical History  Procedure Laterality Date  . Coronary artery bypass graft      04/1997  . Hiatal hernia surgery    . Appendectomy    . Doppler echocardiography  05/18/2012    Left  ventriculars systolic function  is mildly reduced. Ejection fraction =45-50 %       . Doppler echocardiography  03 02 2012    LVEF 50-55 % compared to prior echo in 02/2009, the EF apppears low normal with marked incoordinate septal  motion . The aortic  valve gradients have not  significantly increased   . Stress test   02 07 2011    the post -stress ejection fraction is 54% Global left  venttricular systolic function function is normal . no Ekg changes  Nondiagnostic electrocardiogram    No family history on file.  Social History:  reports that he has never smoked. He has never used smokeless tobacco. He reports that he does not drink alcohol or use illicit drugs.  Review of Systems   Diabetes: glucose variable at home but usually fairly good   Appointment on 06/24/2013  Component Date Value Range Status  . Sodium 06/24/2013 139  135 - 145 mEq/L Final  . Potassium 06/24/2013 4.7  3.5 - 5.1 mEq/L Final  . Chloride 06/24/2013 98  96 - 112 mEq/L Final  . CO2 06/24/2013 35* 19 - 32 mEq/L Final  . Glucose, Bld 06/24/2013 102* 70 - 99 mg/dL Final  . BUN 78/46/9629 21  6 - 23 mg/dL Final  . Creatinine, Ser 06/24/2013 0.8  0.4 - 1.5 mg/dL Final  . Calcium 52/84/1324 10.1  8.4 - 10.5 mg/dL Final  . GFR 40/04/2724 96.63  >60.00 mL/min Final    EXAM:  BP 150/70  Pulse 68  Temp(Src) 98.4 F (36.9 C)  Resp 12  Ht 5\' 7"  (1.702 m)  Wt 177 lb 12.8 oz (80.65 kg)  BMI 27.84 kg/m2  SpO2 93%   Assessment/Plan:   Osteoporosis, long-standing with variable response to bisphosphonate treatments. Had responded well to [facility] and has been maintaining his bone density since then No recent height loss No history of fractures Will plan to give him another infusion of Reclast when it is due but since he has had treatments now since 2004 will probably stop bisphosphonates after the next one Bone density next year in December in followup  High normal calcium: Will continue to follow, recheck PTH on the next visit  Melvin [name] 07/02/2013, 10:39 AM

## 2013-06-28 ENCOUNTER — Other Ambulatory Visit: Payer: Self-pay | Admitting: *Deleted

## 2013-06-28 DIAGNOSIS — I350 Nonrheumatic aortic (valve) stenosis: Secondary | ICD-10-CM

## 2013-07-02 DIAGNOSIS — E119 Type 2 diabetes mellitus without complications: Secondary | ICD-10-CM | POA: Insufficient documentation

## 2013-07-06 ENCOUNTER — Telehealth: Payer: Self-pay | Admitting: Cardiovascular Disease

## 2013-07-06 MED ORDER — AMLODIPINE-ATORVASTATIN 10-40 MG PO TABS: 1.0000 | ORAL_TABLET | Freq: Every day | ORAL | Status: DC

## 2013-07-06 NOTE — Telephone Encounter (Signed)
This note w/ last OV from paper chart placed on Dr. Landry Dyke cart to review and advise.

## 2013-07-06 NOTE — Telephone Encounter (Signed)
Med list reviewed w/ Belenda Cruise, PharmD.  Advised pt should NOT take amlodipine-benazepril and take amlodipine-atorvastatin.  Will need to send in Rx.

## 2013-07-06 NOTE — Telephone Encounter (Signed)
Discussed w/ Phillips Hay, PharmD.  Advised RN call back to review med list w/ pt and find out which meds he has been taking.  If pt has atorvastatin by itself, then he can take amlodipine-benazepril.  If not, then send in Rx for atorvastatin.  Returned call and pt verified x 2.  Pt informed message received and RN needs to review meds w/ him.  Pt agreeable and named meds w/ dosage and instructions.  Pt denied ever taking amlodipine-benazepril or having atorvastatin by itself.  Pt informed RN will discuss w/ Dr. Tresa Endo or PharmD and call him back.  Pt verbalized understanding and agreed w/ plan.

## 2013-07-06 NOTE — Telephone Encounter (Signed)
Please call-He was on Amlodipine/Atorvastatin 5/40 mg and now they sent Amlodipine Benazepril 5/40 mg.He wants to know if he should take the new one?He does not know what to do.

## 2013-07-06 NOTE — Telephone Encounter (Signed)
Returned call and pt informed per conversion w/ PharmD, that he should be taking amlodipine-atorvastatin.  Pt verbalized understanding and asked if the pharmacy will take meds back.  Pt informed RN is not sure.  Pt stated he paid $18 for med.  Pt informed manager will be notified and RN will call back.  Pt verbalized understanding and agreed w/ plan.  Refill for amlodipine-atorvastatin sent to pharmacy w/ instructions to cancel Rx for amlodipine-benazepril.

## 2013-07-21 ENCOUNTER — Encounter: Payer: Self-pay | Admitting: Cardiovascular Disease

## 2013-08-18 ENCOUNTER — Ambulatory Visit (INDEPENDENT_AMBULATORY_CARE_PROVIDER_SITE_OTHER): Payer: Medicare Other | Admitting: Cardiovascular Disease

## 2013-08-18 ENCOUNTER — Encounter: Payer: Self-pay | Admitting: Cardiovascular Disease

## 2013-08-18 VITALS — BP 142/60 | HR 58 | Ht 67.25 in | Wt 176.3 lb

## 2013-08-18 DIAGNOSIS — R0602 Shortness of breath: Secondary | ICD-10-CM

## 2013-08-18 DIAGNOSIS — IMO0001 Reserved for inherently not codable concepts without codable children: Secondary | ICD-10-CM

## 2013-08-18 DIAGNOSIS — R002 Palpitations: Secondary | ICD-10-CM

## 2013-08-18 DIAGNOSIS — I519 Heart disease, unspecified: Secondary | ICD-10-CM

## 2013-08-18 DIAGNOSIS — I447 Left bundle-branch block, unspecified: Secondary | ICD-10-CM

## 2013-08-18 DIAGNOSIS — Z79899 Other long term (current) drug therapy: Secondary | ICD-10-CM

## 2013-08-18 DIAGNOSIS — I359 Nonrheumatic aortic valve disorder, unspecified: Secondary | ICD-10-CM

## 2013-08-18 DIAGNOSIS — E1165 Type 2 diabetes mellitus with hyperglycemia: Secondary | ICD-10-CM

## 2013-08-18 DIAGNOSIS — I350 Nonrheumatic aortic (valve) stenosis: Secondary | ICD-10-CM

## 2013-08-18 DIAGNOSIS — I10 Essential (primary) hypertension: Secondary | ICD-10-CM

## 2013-08-18 MED ORDER — LOSARTAN POTASSIUM 50 MG PO TABS: 50.0000 mg | ORAL_TABLET | Freq: Two times a day (BID) | ORAL | Status: DC

## 2013-08-18 NOTE — Patient Instructions (Addendum)
Dr Tresa EndoKelly has made the following medication change: Take Losartan 50gm - 1 tablet in the morning and 1/2 tablet in the evening for TWO WEEKS. After the TWO weeks TAKE 1 TABLET TWICE DAILY.  Dr Tresa EndoKelly wants you to have lab work done in 2-4 weeks.  Dr Tresa EndoKelly wants you to follow-up in May 2015. You will receive a reminder letter in the mail two months in advance. If you don't receive a letter, please call our office to schedule the follow-up appointment.

## 2013-08-20 ENCOUNTER — Other Ambulatory Visit: Payer: Self-pay | Admitting: Cardiovascular Disease

## 2013-08-23 ENCOUNTER — Encounter: Payer: Self-pay | Admitting: Cardiovascular Disease

## 2013-08-23 NOTE — Progress Notes (Signed)
Patient ID: Melvin [name], male   DOB: 25-Jan-1928, 80 y.o.   MRN: [id number]    HPI: MADDEN [name], is a 80 y.o. male who presents for 2 month cardiology followup evaluation.  [name] has a history of ventricular ectopy and has been on beta blocker therapy since the late 1980s with ectopy suppression. In 1991 he underwent PTCA of his LAD. In October 1998 he underwent CABG surgery x5 by Dr. Dorris Fetch (LIMA to LAD, sequential vein to the PDA and PLA, vein to the OM 1, vein to the second diagonal vessel). He has chronic left bundle branch block, and also has been followed for aortic valve stenosis with aortic insufficiency which previously has been in the mild to moderate range. He has peripheral vascular disease and underwent rotational atherectomy of his left popliteal and tibial trunk and angioplasty of focal popliteal lesions. In November 2013 an echo showed an ejection fraction of 45-50% with diastolic dysfunction grade 1. He was felt to have mild to moderate aortic stenosis with mean gradient of 14, maximum gradient of 24, and valve area calculated at 1.2 cm with mild aortic insufficiency.  In December 2013 he had worn a CardioNet monitor which did show frequent ventricular ectopy without any episodes of ventricular tachycardia but he did have multifocal ectopic complexes.  He has been under significant stress in caring for his wife who has has significant health issues.  He does note decreased energy. He is sleeping well. At times he does note a rare palpitation. He denies any exertionally precipitated chest pain. At times he notes mild shortness of breath which is nonexertional.  On 06/10/2013 he underwent a followup echo Doppler study which showed an ejection fraction of 35-40%. He had abnormal septal motion consistent with dyssynergy due to his bundle branch block. The report was interpreted as mild aortic stenosis and he had a mean gradient of 17 and peak gradient of 29  with a valve area of 1.5 cm. He did have mild to moderate aortic insufficiency. There is mild-to-moderate tricuspid regurgitation left atrium was significantly dilated and he had mild/moderate tricuspid regurgitation.  Past Medical History  Diagnosis Date  . High cholesterol   . Unspecified hereditary and idiopathic peripheral neuropathy 10/01/2012  . RLS (restless legs syndrome) 10/01/2012  . Hypertension   . PVC (premature ventricular contraction)     on beta blocker/pt had cardio net monitor 06/23/2012  . PVC (premature ventricular contraction)     10 /20/1998    Past Surgical History  Procedure Laterality Date  . Coronary artery bypass graft      04/1997  . Hiatal hernia surgery    . Appendectomy    . Doppler echocardiography  05/18/2012    Left  ventriculars systolic function  is mildly reduced. Ejection fraction =45-50 %       . Doppler echocardiography  03 02 2012    LVEF 50-55 % compared to prior echo in 02/2009, the EF apppears low normal with marked incoordinate septal  motion . The aortic  valve gradients have not significantly increased   . Stress test   02 07 2011    the post -stress ejection fraction is 54% Global left  venttricular systolic function function is normal . no Ekg changes  Nondiagnostic electrocardiogram    Allergies  Allergen Reactions  . Other     Patient is allergic to something but doesn't know the name.  . Pramipexole     Current Outpatient Prescriptions  Medication  Sig Dispense Refill  . amLODipine-atorvastatin (CADUET) 10-40 MG per tablet Take 1 tablet by mouth daily.  90 tablet  3  . aspirin 325 MG tablet Take 325 mg by mouth daily.      Marland Kitchen atenolol (TENORMIN) 50 MG tablet Take two tabs (100mg ) in AM and one & half (75mg ) in PM  315 tablet  3  . Calcium-Vitamin D (CALTRATE 600 PLUS-VIT D PO) Take 1 tablet by mouth daily.       . Gabapentin Enacarbil ER (HORIZANT) 300 MG TB24 Take 300 mg by mouth daily with supper.  30 tablet  5  . losartan  (COZAAR) 50 MG tablet Take 1 tablet (50 mg total) by mouth 2 (two) times daily.  180 tablet  3  . metformin (FORTAMET) 1000 MG (OSM) 24 hr tablet Take 1,000 mg by mouth 2 (two) times daily with a meal.      . Multiple Vitamins-Minerals (PRESERVISION AREDS 2 PO) Take 2 capsules by mouth once.      . multivitamin-iron-minerals-folic acid (CENTRUM) chewable tablet Chew 1 tablet by mouth daily.      . mupirocin ointment (BACTROBAN) 2 % Apply 1 application topically 2 (two) times daily.      . niacin (NIASPAN) 750 MG CR tablet Take 2 tablets (1,500 mg total) by mouth at bedtime.      Marland Kitchen NITROSTAT 0.4 MG SL tablet       . polycarbophil (FIBERCON) 625 MG tablet Take 625 mg by mouth daily.      Marland Kitchen torsemide (DEMADEX) 20 MG tablet Take 1 tablet (20 mg total) by mouth 2 (two) times daily.  180 tablet  3  . zoledronic acid (RECLAST) 5 MG/100ML SOLN Inject 100 mLs (5 mg total) into the vein once.  100 mL  0  . clopidogrel (PLAVIX) 75 MG tablet Take 1 tablet (75 mg total) by mouth daily.  90 tablet  2   No current facility-administered medications for this visit.    Socially he is married, retired, and has one child.  He does not routinely exercise. There is no tobacco or alcohol use.  ROS is negative for fever chills or night sweats. He denies skin rash. He does were glasses. He denies change in vision symptoms. There is no change in hearing. He is unaware of lymphadenopathy. He does admit to some mild shortness of and notes rare palpitations which have improved with home stress reduction. He denies bleeding. He denies any progressive claudication symptoms. He denies paresthesias. He denies indigestion nausea or vomiting. He is unaware of any blood in stool or urine.  He denies claudication. He denies myalgias. There is no diabetes. He denies cold or heat intolerance. He denies difficulty with sleep. Other comprehensive 14 point system review is negative.  PE BP 142/60  Pulse 58  Ht 5' 7.25" (1.708 m)  Wt  176 lb 4.8 oz (79.969 kg)  BMI 27.41 kg/m2   General: Alert, oriented, no distress.  HEENT: Normocephalic, atraumatic. Pupils round and reactive; sclera anicteric;  Nose without nasal septal hypertrophy Mouth/Parynx benign; Mallinpatti scale  2 Neck: No JVD, no carotid bruits with transmitted murmur Chest: No tenderness to palpation Lungs: clear to ausculatation and percussion; no wheezing or rales Heart: RRR, s1 s2 normal 2/6 sem aortic area and one over 6 diastolic murmur compatible with AS/AR. No rubs thrills or heaves. Abdomen: soft, nontender; no hepatosplenomehaly, BS+; abdominal aorta nontender and not dilated by palpation. Back: No CVA Pulses 2+ Extremities: Significant improvement in  prior ankle edema, Homan's sign negative  Neurologic: grossly nonfocal Psychologic: Normal affect and mood  ECG (independently read by me): Sinus bradycardia 58 beats per minute. Left bundle branch block. Borderline first-degree block with a PR interval 208 ms the  Prior ECG of 06/22/2013:  Sinus rhythm at 63 beats per minute with resolution of prior first degree heart block. Left bundle branch block with repolarization changes.  LABS:  BMET    Component Value Date/Time   NA 139 06/24/2013 1029   K 4.7 06/24/2013 1029   CL 98 06/24/2013 1029   CO2 35* 06/24/2013 1029   GLUCOSE 102* 06/24/2013 1029   BUN 21 06/24/2013 1029   CREATININE 0.8 06/24/2013 1029   CALCIUM 10.1 06/24/2013 1029   GFRNONAA >60 02/06/2011 0545   GFRAA >60 02/06/2011 0545     Hepatic Function Panel     Component Value Date/Time   PROT 6.4 09/16/2012 1215   ALBUMIN 4.3 09/16/2012 1215   AST 18 09/16/2012 1215   ALT 13 09/16/2012 1215   ALKPHOS 46 09/16/2012 1215   BILITOT 0.6 09/16/2012 1215   BILIDIR 0.1 09/16/2012 1215   IBILI 0.5 09/16/2012 1215     CBC    Component Value Date/Time   WBC 6.5 02/06/2011 0545   RBC 3.73* 02/06/2011 0545   HGB 13.9 11/19/2011 1415   HCT 41.0 11/19/2011 1415   PLT 110* 02/06/2011 0545     MCV 95.2 02/06/2011 0545   MCH 31.6 02/06/2011 0545   MCHC 33.2 02/06/2011 0545   RDW 12.9 02/06/2011 0545     BNP No results found for this basename: probnp    Lipid Panel  No results found for this basename: chol,  trig,  hdl,  cholhdl,  vldl,  ldlcalc     RADIOLOGY: No results found.    ASSESSMENT AND PLAN:   Mr. Marrian SalvageDominick is now 16 years status post CABG surgery.  His last nuclear study in March 2013 continued to show fairly normal perfusion with the exception of a mild anteroseptal defect most likely related to his left bundle branch block. His priori echo in November 2013 which did suggest mild/moderate aortic stenosis. His 1 year followup echo however does suggest slight additional reduction of LV function and ejection fraction was reported to be 35-40%. This may account for a lower gradient on his most recent echo although valve area was not significantly narrowed and minimal moderate range his palpitations and ectopy have improved with his increased atenolol dose which he now takes 100 mg in the morning and 75 mg at night. He has been on losartan 50 mg daily. In light of his LV dysfunction I will slightly additionally titrate this to 50 mg in the morning and 25 mg at night for 2 weeks and then if he tolerates this he will increase it to 50 mg twice a day. I have recommended reduction of his 325 mg aspirin dose to 81 mg. I am recommending a followup echo Doppler study be obtained in May for 6 month evaluation in light of his recent echo change in LV function and I will see him back in the office for followup evaluation.   Lennette Biharihomas A. Kelly, MD, [facility] CenterFACC  08/23/2013 9:27 AM

## 2013-09-09 ENCOUNTER — Ambulatory Visit (INDEPENDENT_AMBULATORY_CARE_PROVIDER_SITE_OTHER): Payer: Medicare Other | Admitting: Neurology

## 2013-09-09 ENCOUNTER — Encounter: Payer: Self-pay | Admitting: Neurology

## 2013-09-09 VITALS — BP 157/66 | HR 55 | Temp 97.8°F | Ht 67.75 in | Wt 174.5 lb

## 2013-09-09 DIAGNOSIS — G2581 Restless legs syndrome: Secondary | ICD-10-CM

## 2013-09-09 DIAGNOSIS — G609 Hereditary and idiopathic neuropathy, unspecified: Secondary | ICD-10-CM

## 2013-09-09 DIAGNOSIS — I739 Peripheral vascular disease, unspecified: Secondary | ICD-10-CM

## 2013-09-09 DIAGNOSIS — R609 Edema, unspecified: Secondary | ICD-10-CM

## 2013-09-09 NOTE — Progress Notes (Signed)
Subjective:    Patient ID: Melvin [name] is a 80 y.o. male.  HPI    Interim history:  Melvin [name] is a very pleasant 80 year old right-handed gentleman with an underlying medical history of type 2 diabetes, osteoporosis, lower extremity edema, left bundle branch block, heart palpitations, aortic stenosis, hypertension, and hyperlipidemia, who presents for followup consultation of his left leg pain, possible atypical RLS in conjunction with neuropathy. He is unaccompanied today. I last saw him on 03/09/2013, at which time I felt his leg pain was do to a combination of peripheral neuropathy, peripheral vascular disease, chronic edema, and perhaps atypical RLS. He was unfortunately not able to tolerate Requip or Mirapex but had improvement in his pain with both dopamine agonists. Horizant at 300 mg has been somewhat helpful but he had mild morning grogginess.  Today, he reports, feeling fairly stable, taking Horizant qod, to help it last longer d/t cost. He was able to drive to this office without having to stop to walk around. He had an echo in 12/14, which showed EF of 35 to 40%. He has a FU in 6 months.   I last saw him on 12/03/12, at which time he reported side effects from Mirapex even though it had actually helped his left leg pain, he had to stop it. On 09/01/2012 he reported side effects with a Requip, which made him groggy and he had to stop it. It had helped his leg pain, though. I first met him on 07/21/2012 at which time a suggested blood work and a trial of Requip for the possibility of atypical RLS affecting only his left leg. He also has lower back pain and radiating pain. MRI lumbar spine in November 2012 did not show any structural lesions. He has had kidney cysts. 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 He has tried Neurontin. He has undergone epidural injections in  2013. At our last visit in 5/14 I suggested a trial of Horizant. He reported improvement in his night time pain and better sleep at night, but some daytime somnolence. He also worried about the cost of the medicine of over $120 per month. He had noted no other side effects.   His Past Medical History Is Significant For: Past Medical History  Diagnosis Date  . High cholesterol   . Unspecified hereditary and idiopathic peripheral neuropathy 10/01/2012  . RLS (restless legs syndrome) 10/01/2012  . Hypertension   . PVC (premature ventricular contraction)     on beta blocker/pt had cardio net monitor 06/23/2012  . PVC (premature ventricular contraction)     10 /20/1998    His Past Surgical History Is Significant For: Past Surgical History  Procedure Laterality Date  . Coronary artery bypass graft      04/1997  . Hiatal hernia surgery    . Appendectomy    . Doppler echocardiography  05/18/2012    Left  ventriculars systolic function  is mildly reduced. Ejection fraction =45-50 %       . Doppler echocardiography  03 02 2012    LVEF 50-55 % compared to prior echo in 02/2009, the EF apppears low normal with marked incoordinate septal  motion . The aortic  valve gradients have not significantly increased   . Stress test   02 07 2011    the post -stress ejection fraction is 54% Global left  venttricular systolic function function is normal .  no Ekg changes  Nondiagnostic electrocardiogram    His Family History Is Significant For: History reviewed. No pertinent family history.  His Social History Is Significant For: History   Social History  . Marital Status: Married    Spouse Name: Melvin [name]    Number of Children: 1  . Years of Education: College   Occupational History  .     Social History Main Topics  . Smoking status: Never Smoker   . Smokeless tobacco: Never Used  . Alcohol Use: No  . Drug Use: No  . Sexual Activity: None   Other Topics Concern  . None   Social History  Narrative   Pt lives at home with his spouse.   Has been married 44yrs.   Caffeine QVZ:DGLOVF    His Allergies Are:  Allergies  Allergen Reactions  . Other     Patient is allergic to something but doesn't know the name.  . Pramipexole   :   His Current Medications Are:  Outpatient Encounter Prescriptions as of 09/09/2013  Medication Sig  . amLODipine-atorvastatin (CADUET) 10-40 MG per tablet Take 1 tablet by mouth daily.  Marland Kitchen aspirin 325 MG tablet Take 325 mg by mouth daily.  Marland Kitchen atenolol (TENORMIN) 50 MG tablet Take two tabs ($RemoveB'100mg'VvbyAJQK$ ) in AM and one & half ($Remov'75mg'AEReZR$ ) in PM  . Calcium-Vitamin D (CALTRATE 600 PLUS-VIT D PO) Take 1 tablet by mouth daily.   . clopidogrel (PLAVIX) 75 MG tablet Take 1 tablet (75 mg total) by mouth daily.  . Gabapentin Enacarbil ER (HORIZANT) 300 MG TB24 Take 300 mg by mouth daily with supper.  . losartan (COZAAR) 50 MG tablet Take 1 tablet (50 mg total) by mouth 2 (two) times daily.  . metformin (FORTAMET) 1000 MG (OSM) 24 hr tablet Take 1,000 mg by mouth 2 (two) times daily with a meal.  . Multiple Vitamins-Minerals (PRESERVISION AREDS 2 PO) Take 1 capsule by mouth 2 (two) times daily.   . multivitamin-iron-minerals-folic acid (CENTRUM) chewable tablet Chew 1 tablet by mouth daily.  . mupirocin ointment (BACTROBAN) 2 % Apply 1 application topically 2 (two) times daily.  . niacin (NIASPAN) 750 MG CR tablet Take 2 tablets (1,500 mg total) by mouth at bedtime.  Marland Kitchen NITROSTAT 0.4 MG SL tablet   . polycarbophil (FIBERCON) 625 MG tablet Take 625 mg by mouth daily.  Marland Kitchen torsemide (DEMADEX) 20 MG tablet Take 1 tablet (20 mg total) by mouth 2 (two) times daily.  . zoledronic acid (RECLAST) 5 MG/100ML SOLN Inject 100 mLs (5 mg total) into the vein once.  :  Review of Systems:  Out of a complete 14 point review of systems, all are reviewed and negative with the exception of these symptoms as listed below:   Review of Systems  Respiratory: Positive for shortness of breath.    Cardiovascular:       Murmur  Musculoskeletal: Positive for arthralgias and neck pain.  Neurological:       Restless leg    Objective:  Neurologic Exam  Physical Exam Physical Examination:   Filed Vitals:   09/09/13 1214  BP: 157/66  Pulse: 55  Temp: 97.8 F (36.6 C)    General Examination: The patient is a very pleasant 80 y.o. male in no acute distress.   HEENT exam: Normocephalic, atraumatic, face is symmetric with normal facial animation noted. Neck is supple with full range of motion. Hearing is grossly intact. Pupils are equal, round and reactive to light and accommodation. Extraocular tracking is  good without nystagmus. Speech is clear. Airway examination reveals mild mouth dryness and normal airway findings.  Chest is clear to auscultation, with no wheezing, crackles or rhonchi noted.  Heart sounds are normal with the exception of a 0/2-4/0 systolic murmur audible, unchanged (the patient states he has been told that he has a murmur and leaky valves).  Abdomen soft, nontender with positive bowel sounds.  Skin examination reveals skin to be warm and dry. There are hyperpigmentations distal lower extremities and scars from prior vein harvesting. He has 1+ pitting edema in the distal lower extremities to his mid shin areas bilaterally. Better from last time.  Neurologically: Mental status: The patient is awake, alert and oriented in all 3 spheres. His memory, attention, language and knowledge are appropriate. Cranial nerves are as described under HEENT exam. Motor-wise he has normal bulk strength and tone throughout with the exception of 4+ out of 5 weakness in his left hip flexor, unchanged. Reflexes are 2+ in the upper extremities trace in both knees and absent in both ankles. Sensory exam reveals decreased sensation to vibration, pinprick and temperature sense in both distal lower extremities left worse than right. On the left his sensory loss affects the area above the left  ankle to mid shin, on the right really only affects the right foot to above ankle. He has normal sensation in his hands and face. He has no drift and no tremor. Romberg testing reveals mild swaying but no corrective steps. His gait, station and balance are unremarkable for age. He does walk cautiously.   Assessment and Plan:   In summary, Melvin [name] is a 80 yo gentleman with a 2 to 3 year history of L leg pain, most likely due to multiple issues: peripheral neuropathy, PVD, chronic leg edema and RLS. He was not able to tolerate Requip or Mirapex, but had improvement in his pain with both DAs. Horizant, which I started in 5/14, at 300 mg at bedtime has been helpful, but it is expensive, and since he has some residual morning sleepiness, he does not take it at night when he knows he has to drive in the morning. Previous electrodiagnostic data suggesting demyelinating and axonal neuropathy in the left lower extremity. He has stable findings with mild left hip flexor weakness and absence of ankle jerks and trace knee jerks, bilaterally. He has sensory neuropathy affecting both lower extremities, L > R, and he has PVD, veins harvested from both legs, diabetes and peripheral edema. His exam has essentially remained unchanged. I would like to see him back in 6 months from now, sooner if the need arises and asked him to call us with any interim questions, concerns, problems, updates or refill requests. He was in agreement.

## 2013-09-09 NOTE — Patient Instructions (Signed)
I think overall you are doing fairly well and are stable at this point.   I do have some generic suggestions for you today:  Please make sure that you drink plenty of fluids. I would like for you to exercise daily for example in the form of walking 20-30 minutes every day, if you can. Please keep a regular sleep-wake schedule, keep regular meal times, do not skip any meals, eat  healthy snacks in between meals, such as fruit or nuts. Try to eat protein with every meal.   As far as your medications are concerned, I would like to suggest: no changes.    As far as diagnostic testing, I recommend: no new test needed.   Engage in social activities in your community and with your family and try to keep up with current events by reading the newspaper or watching the news.  I do not think we need to make any changes in your medications at this point. I think you're stable enough that I can see you back in 6 months, sooner if we need to. Please call us if you have any interim questions, concerns, or problems or updates to need to discuss.  Our nursing staff will answer any of your questions and relay your messages to me and will give you my messages.   Our phone number is [phone]. We also have an after hours call service for urgent matters and there is a physician on-call for urgent questions. For any emergencies you know to call 911 or go to the nearest emergency room.

## 2013-09-17 LAB — BASIC METABOLIC PANEL
BUN: 21 mg/dL (ref 6–23)
CO2: 31 mEq/L (ref 19–32)
Calcium: 9.8 mg/dL (ref 8.4–10.5)
Chloride: 96 mEq/L (ref 96–112)
Creat: 0.83 mg/dL (ref 0.50–1.35)
GLUCOSE: 84 mg/dL (ref 70–99)
Potassium: 4.4 mEq/L (ref 3.5–5.3)
SODIUM: 137 meq/L (ref 135–145)

## 2013-09-20 ENCOUNTER — Telehealth: Payer: Self-pay | Admitting: Endocrinology

## 2013-09-20 NOTE — Telephone Encounter (Signed)
Pt needs information regarding an injection   Please advise  Thank you :)

## 2013-09-21 ENCOUNTER — Encounter: Payer: Self-pay | Admitting: *Deleted

## 2013-09-21 ENCOUNTER — Telehealth: Payer: Self-pay | Admitting: *Deleted

## 2013-09-21 NOTE — Telephone Encounter (Signed)
Patient will have his labs drawn in [facility] and have the labs faxed here, I told him as soon as those were received we would schedule the Reclast.

## 2013-09-21 NOTE — Telephone Encounter (Signed)
He is due for Reclast  infusion, please initiate paperwork, will also need basic metabolic panel done

## 2013-09-21 NOTE — Telephone Encounter (Signed)
Patient called about his reclast infusion, he said you gave him  Some labs to be done before the procedure but no one has called him to schedule this?  Do you still want this done?

## 2013-09-22 ENCOUNTER — Other Ambulatory Visit: Payer: Self-pay | Admitting: Endocrinology

## 2013-09-22 LAB — BASIC METABOLIC PANEL
BUN: 16 mg/dL (ref 6–23)
CO2: 32 meq/L (ref 19–32)
Calcium: 9.5 mg/dL (ref 8.4–10.5)
Chloride: 96 mEq/L (ref 96–112)
Creat: 0.76 mg/dL (ref 0.50–1.35)
Glucose, Bld: 107 mg/dL — ABNORMAL HIGH (ref 70–99)
Potassium: 4.2 mEq/L (ref 3.5–5.3)
Sodium: 137 mEq/L (ref 135–145)

## 2013-10-04 ENCOUNTER — Encounter (INDEPENDENT_AMBULATORY_CARE_PROVIDER_SITE_OTHER): Payer: Self-pay | Admitting: *Deleted

## 2013-10-06 ENCOUNTER — Other Ambulatory Visit (INDEPENDENT_AMBULATORY_CARE_PROVIDER_SITE_OTHER): Payer: Self-pay | Admitting: Internal Medicine

## 2013-10-06 DIAGNOSIS — K769 Liver disease, unspecified: Secondary | ICD-10-CM

## 2013-10-12 ENCOUNTER — Encounter (HOSPITAL_COMMUNITY)
Admission: RE | Admit: 2013-10-12 | Discharge: 2013-10-12 | Disposition: A | Discharge status: Home / Self Care | Payer: Medicare Other | Source: Ambulatory Visit | Attending: Endocrinology | Admitting: Endocrinology

## 2013-10-12 ENCOUNTER — Ambulatory Visit (HOSPITAL_COMMUNITY): Admission: RE | Admit: 2013-10-12 | Payer: Medicare Other | Source: Ambulatory Visit

## 2013-10-12 ENCOUNTER — Other Ambulatory Visit (HOSPITAL_COMMUNITY): Payer: Self-pay | Admitting: Endocrinology

## 2013-10-12 DIAGNOSIS — M810 Age-related osteoporosis without current pathological fracture: Secondary | ICD-10-CM | POA: Insufficient documentation

## 2013-10-12 DIAGNOSIS — Z5181 Encounter for therapeutic drug level monitoring: Secondary | ICD-10-CM | POA: Insufficient documentation

## 2013-10-12 MED ORDER — SODIUM CHLORIDE 0.9 % IV SOLN: Freq: Once | INTRAVENOUS | Status: DC

## 2013-10-12 MED ORDER — ZOLEDRONIC ACID 5 MG/100ML IV SOLN
5.0000 mg | Freq: Once | INTRAVENOUS | Status: AC
  Administered 2013-10-12: 5 mg via INTRAVENOUS

## 2013-10-12 MED ORDER — ZOLEDRONIC ACID 5 MG/100ML IV SOLN
INTRAVENOUS | Status: AC
  Administered 2013-10-12: 13:00:00 5 mg via INTRAVENOUS
  Filled 2013-10-12: qty 100

## 2013-10-15 ENCOUNTER — Ambulatory Visit (HOSPITAL_COMMUNITY)
Admission: RE | Admit: 2013-10-15 | Discharge: 2013-10-15 | Disposition: A | Discharge status: Home / Self Care | Payer: Medicare Other | Source: Ambulatory Visit | Attending: Internal Medicine | Admitting: Internal Medicine

## 2013-10-15 ENCOUNTER — Other Ambulatory Visit: Payer: Self-pay | Admitting: *Deleted

## 2013-10-15 DIAGNOSIS — I739 Peripheral vascular disease, unspecified: Secondary | ICD-10-CM | POA: Insufficient documentation

## 2013-10-15 DIAGNOSIS — I70219 Atherosclerosis of native arteries of extremities with intermittent claudication, unspecified extremity: Secondary | ICD-10-CM

## 2013-10-15 MED ORDER — NIACIN ER (ANTIHYPERLIPIDEMIC) 750 MG PO TBCR: 1500.0000 mg | EXTENDED_RELEASE_TABLET | Freq: Every day | ORAL | Status: DC

## 2013-10-15 NOTE — Progress Notes (Signed)
Arterial Duplex Lower Ext. Completed. Austyn Seier, BS, RDMS, RVT  

## 2013-10-20 ENCOUNTER — Ambulatory Visit (HOSPITAL_COMMUNITY)
Admission: RE | Admit: 2013-10-20 | Discharge: 2013-10-20 | Disposition: A | Discharge status: Home / Self Care | Payer: Medicare Other | Source: Ambulatory Visit | Attending: Internal Medicine | Admitting: Internal Medicine

## 2013-10-20 DIAGNOSIS — K769 Liver disease, unspecified: Secondary | ICD-10-CM

## 2013-10-20 DIAGNOSIS — K7689 Other specified diseases of liver: Secondary | ICD-10-CM | POA: Insufficient documentation

## 2013-10-20 DIAGNOSIS — Q619 Cystic kidney disease, unspecified: Secondary | ICD-10-CM | POA: Insufficient documentation

## 2013-10-26 ENCOUNTER — Other Ambulatory Visit: Payer: Self-pay | Admitting: *Deleted

## 2013-10-26 DIAGNOSIS — I739 Peripheral vascular disease, unspecified: Secondary | ICD-10-CM

## 2013-10-28 NOTE — Telephone Encounter (Signed)
Encounter Closed--TP 10/28/2013 

## 2013-11-15 ENCOUNTER — Ambulatory Visit (HOSPITAL_COMMUNITY)
Admission: RE | Admit: 2013-11-15 | Discharge: 2013-11-15 | Disposition: A | Discharge status: Home / Self Care | Payer: Medicare Other | Source: Ambulatory Visit | Attending: Cardiology | Admitting: Cardiology

## 2013-11-15 DIAGNOSIS — I10 Essential (primary) hypertension: Secondary | ICD-10-CM

## 2013-11-15 DIAGNOSIS — I359 Nonrheumatic aortic valve disorder, unspecified: Secondary | ICD-10-CM | POA: Insufficient documentation

## 2013-11-15 NOTE — Progress Notes (Signed)
2D Echo Performed 11/15/2013    Aseret Hoffman, RCS  

## 2013-11-23 ENCOUNTER — Ambulatory Visit: Payer: Medicare Other | Admitting: Cardiovascular Disease

## 2013-11-24 ENCOUNTER — Ambulatory Visit (INDEPENDENT_AMBULATORY_CARE_PROVIDER_SITE_OTHER): Payer: Medicare Other | Admitting: Ophthalmology

## 2013-12-01 ENCOUNTER — Encounter: Payer: Self-pay | Admitting: *Deleted

## 2013-12-10 ENCOUNTER — Other Ambulatory Visit: Payer: Self-pay | Admitting: *Deleted

## 2013-12-10 MED ORDER — ATENOLOL 100 MG PO TABS: 100.0000 mg | ORAL_TABLET | Freq: Two times a day (BID) | ORAL | Status: DC

## 2013-12-10 NOTE — Telephone Encounter (Signed)
Patient states that Dr. Tresa Endo verbally told him to increase his atenolol to 100 mg twice daily. Patient came into office with wife and requested for a [facility] the change.

## 2013-12-29 ENCOUNTER — Ambulatory Visit (INDEPENDENT_AMBULATORY_CARE_PROVIDER_SITE_OTHER): Payer: Medicare Other | Admitting: Ophthalmology

## 2014-01-24 ENCOUNTER — Encounter: Payer: Self-pay | Admitting: Cardiovascular Disease

## 2014-01-24 ENCOUNTER — Ambulatory Visit (INDEPENDENT_AMBULATORY_CARE_PROVIDER_SITE_OTHER): Payer: Medicare Other | Admitting: Cardiovascular Disease

## 2014-01-24 VITALS — BP 144/64 | HR 57 | Ht 67.0 in | Wt 173.1 lb

## 2014-01-24 DIAGNOSIS — R079 Chest pain, unspecified: Secondary | ICD-10-CM

## 2014-01-24 DIAGNOSIS — IMO0001 Reserved for inherently not codable concepts without codable children: Secondary | ICD-10-CM

## 2014-01-24 DIAGNOSIS — I447 Left bundle-branch block, unspecified: Secondary | ICD-10-CM

## 2014-01-24 DIAGNOSIS — I350 Nonrheumatic aortic (valve) stenosis: Secondary | ICD-10-CM

## 2014-01-24 DIAGNOSIS — I359 Nonrheumatic aortic valve disorder, unspecified: Secondary | ICD-10-CM

## 2014-01-24 DIAGNOSIS — E7800 Pure hypercholesterolemia, unspecified: Secondary | ICD-10-CM

## 2014-01-24 DIAGNOSIS — I10 Essential (primary) hypertension: Secondary | ICD-10-CM

## 2014-01-24 DIAGNOSIS — R002 Palpitations: Secondary | ICD-10-CM

## 2014-01-24 DIAGNOSIS — E1165 Type 2 diabetes mellitus with hyperglycemia: Secondary | ICD-10-CM

## 2014-01-24 MED ORDER — TORSEMIDE 20 MG PO TABS: 20.0000 mg | ORAL_TABLET | Freq: Two times a day (BID) | ORAL | Status: DC

## 2014-01-24 NOTE — Patient Instructions (Signed)
Your physician has requested that you have an echocardiogram. Echocardiography is a painless test that uses sound waves to create images of your heart. It provides your doctor with information about the size and shape of your heart and how well your heart's chambers and valves are working. This procedure takes approximately one hour. There are no restrictions for this procedure. This will be scheduled for November   Your physician recommends that you schedule a follow-up appointment after your echocardiogram in November

## 2014-01-29 ENCOUNTER — Encounter: Payer: Self-pay | Admitting: Cardiovascular Disease

## 2014-01-29 NOTE — Progress Notes (Signed)
Patient ID: Melvin [name], male   DOB: Aug 20, 1927, 80 y.o.   MRN: [id number]    HPI: Melvin [name] is a 80 y.o. male who presents for 6 month cardiology followup evaluation.  Melvin [name] has a history of ventricular ectopy and has been on beta blocker therapy since the late 1980s with ectopy suppression. He has CAD and In 1991 he underwent PTCA of his LAD. In October 1998 he underwent CABG surgery x5 by Dr. Dorris Fetch (LIMA to LAD, sequential vein to the PDA and PLA, vein to the OM 1, vein to the second diagonal vessel). He has chronic left bundle branch block, and also has been followed for aortic valve stenosis with aortic insufficiency which previously has been in the mild to moderate range. He has peripheral vascular disease and underwent rotational atherectomy of his left popliteal and tibial trunk and angioplasty of focal popliteal lesions. In November 2013 an echo showed an ejection fraction of 45-50% with diastolic dysfunction grade 1. He was felt to have mild to moderate aortic stenosis with mean gradient of 14, maximum gradient of 24, and valve area calculated at 1.2 cm with mild aortic insufficiency.  In December 2013 he had worn a CardioNet monitor which did show frequent ventricular ectopy without any episodes of ventricular tachycardia but he did have multifocal ectopic complexes.  He had been under significant stress in caring for his wife who has has significant health issues. On 06/10/2013 a followup echo Doppler study  showed an ejection fraction of 35-40%. He had abnormal septal motion consistent with dyssynergy due to his bundle branch block. The report was interpreted as mild aortic stenosis and he had a mean gradient of 17 and peak gradient of 29 with a valve area of 1.5 cm. He did have mild to moderate aortic insufficiency. There is mild-to-moderate tricuspid regurgitation left atrium was significantly dilated and he had mild/moderate tricuspid  regurgitation.  He underwent a 6 month follow-up echo on 11/15/2013.  This showed an ejection fraction of approximately 35- 40% which was unchanged from 06/2013.  There was mild LVH with mild focal basal hypertrophy of the septum.  Systolic function was moderately reduced.  He was felt to have moderate aortic stenosis with mild AR.  Mean gradient was 7 mm, and peak transvalvular aortic gradient was 31 mm.  However, because of his moderate LV dysfunction, aortic valve area was 1.14 cm, compatible with moderate gas.  There is also mild mitral regurgitation.  He ventricular dyssynergy due to his chronic left bundle branch block.  Estimated PA pressure was 37 mm.  Melvin [name] denies chest pain.  He denies presyncope.  He does note some mild shortness of breath with activity.  He denies any recent palpitations.  Past Medical History  Diagnosis Date  . High cholesterol   . Unspecified hereditary and idiopathic peripheral neuropathy 10/01/2012  . RLS (restless legs syndrome) 10/01/2012  . Hypertension   . PVC (premature ventricular contraction)     on beta blocker/pt had cardio net monitor 06/23/2012  . PVC (premature ventricular contraction)     10 /20/1998    Past Surgical History  Procedure Laterality Date  . Coronary artery bypass graft      04/1997  . Hiatal hernia surgery    . Appendectomy    . Doppler echocardiography  05/18/2012    Left  ventriculars systolic function  is mildly reduced. Ejection fraction =45-50 %       . Doppler echocardiography  03 02  2012    LVEF 50-55 % compared to prior echo in 02/2009, the EF apppears low normal with marked incoordinate septal  motion . The aortic  valve gradients have not significantly increased   . Stress test   02 07 2011    the post -stress ejection fraction is 54% Global left  venttricular systolic function function is normal . no Ekg changes  Nondiagnostic electrocardiogram    Allergies  Allergen Reactions  . Other     Patient is  allergic to something but doesn't know the name.  . Pramipexole     Current Outpatient Prescriptions  Medication Sig Dispense Refill  . amLODipine-atorvastatin (CADUET) 10-40 MG per tablet Take 1 tablet by mouth daily.  90 tablet  3  . aspirin 325 MG tablet Take 325 mg by mouth daily.      Marland Kitchen atenolol (TENORMIN) 100 MG tablet Take 1 tablet (100 mg total) by mouth 2 (two) times daily.  180 tablet  3  . Calcium-Vitamin D (CALTRATE 600 PLUS-VIT D PO) Take 1 tablet by mouth daily.       . clopidogrel (PLAVIX) 75 MG tablet Take 1 tablet (75 mg total) by mouth daily.  90 tablet  2  . Gabapentin Enacarbil ER (HORIZANT) 300 MG TB24 Take 300 mg by mouth daily with supper.  30 tablet  5  . losartan (COZAAR) 50 MG tablet Take 1 tablet (50 mg total) by mouth 2 (two) times daily.  180 tablet  3  . metformin (FORTAMET) 1000 MG (OSM) 24 hr tablet Take 1,000 mg by mouth 2 (two) times daily with a meal.      . Multiple Vitamins-Minerals (PRESERVISION AREDS 2 PO) Take 1 capsule by mouth 2 (two) times daily.       . multivitamin-iron-minerals-folic acid (CENTRUM) chewable tablet Chew 1 tablet by mouth daily.      . niacin (NIASPAN) 750 MG CR tablet Take 2 tablets (1,500 mg total) by mouth at bedtime.  180 tablet  3  . NITROSTAT 0.4 MG SL tablet       . polycarbophil (FIBERCON) 625 MG tablet Take 625 mg by mouth daily.      Marland Kitchen torsemide (DEMADEX) 20 MG tablet Take 1 tablet (20 mg total) by mouth 2 (two) times daily.  180 tablet  3  . zoledronic acid (RECLAST) 5 MG/100ML SOLN Inject 100 mLs (5 mg total) into the vein once.  100 mL  0   No current facility-administered medications for this visit.    Socially he is married, retired, and has one child.  He does not routinely exercise. There is no tobacco or alcohol use.  ROS General: Negative; No fevers, chills, or night sweats;  HEENT: Negative; No changes in vision or hearing, sinus congestion, difficulty swallowing Pulmonary: Negative; No cough, wheezing,  shortness of breath, hemoptysis Cardiovascular: See HPI; No chest pain, presyncope, syncope, palpitations GI: Negative; No nausea, vomiting, diarrhea, or abdominal pain GU: Negative; No dysuria, hematuria, or difficulty voiding Musculoskeletal: Negative; no myalgias, joint pain, or weakness Hematologic/Oncology: Negative; no easy bruising, bleeding Endocrine: Negative; no heat/cold intolerance; no diabetes Neuro: Negative; no changes in balance, headaches Skin: Negative; No rashes or skin lesions Psychiatric: Negative; No behavioral problems, depression Sleep: Negative; No snoring, daytime sleepiness, hypersomnolence, bruxism, restless legs, hypnogognic hallucinations, no cataplexy Other comprehensive 14 point system review is negative.ROS is negative for fever chills or night sweats. e.  PE BP 144/64  Pulse 57  Ht 5\' 7"  (1.702 m)  Wt  173 lb 1.6 oz (78.518 kg)  BMI 27.11 kg/m2   General: Alert, oriented, no distress.  HEENT: Normocephalic, atraumatic. Pupils round and reactive; sclera anicteric;  Nose without nasal septal hypertrophy Mouth/Parynx benign; Mallinpatti scale  2 Neck: No JVD, no carotid bruits with transmitted murmur Chest: No tenderness to palpation Lungs: clear to ausculatation and percussion; no wheezing or rales Heart: RRR, s1 s2 normal 2/6 sem aortic area and 1/ 6 diastolic murmur compatible with AS/AR. No rubs thrills or heaves. Abdomen: soft, nontender; no hepatosplenomehaly, BS+; abdominal aorta nontender and not dilated by palpation. Back: No CVA Pulses 2+ Extremities: Significant improvement in prior ankle edema, Homan's sign negative  Neurologic: grossly nonfocal Psychologic: Normal affect and mood  ECG (independently read by me): Sinus bradycardia 57 beats per minute.  Left bundle branch block with repolarization changes.  PR interval is now normal at 162 ms  Prior February 2015 ECG (independently read by me): Sinus bradycardia 58 beats per minute. Left  bundle branch block. Borderline first-degree block with a PR interval 208 ms the  Prior ECG of 06/22/2013:  Sinus rhythm at 63 beats per minute with resolution of prior first degree heart block. Left bundle branch block with repolarization changes.  LABS:  BMET    Component Value Date/Time   NA 137 09/22/2013 1455   K 4.2 09/22/2013 1455   CL 96 09/22/2013 1455   CO2 32 09/22/2013 1455   GLUCOSE 107* 09/22/2013 1455   BUN 16 09/22/2013 1455   CREATININE 0.76 09/22/2013 1455   CREATININE 0.8 06/24/2013 1029   CALCIUM 9.5 09/22/2013 1455   GFRNONAA >60 02/06/2011 0545   GFRAA >60 02/06/2011 0545     Hepatic Function Panel     Component Value Date/Time   PROT 6.4 09/16/2012 1215   ALBUMIN 4.3 09/16/2012 1215   AST 18 09/16/2012 1215   ALT 13 09/16/2012 1215   ALKPHOS 46 09/16/2012 1215   BILITOT 0.6 09/16/2012 1215   BILIDIR 0.1 09/16/2012 1215   IBILI 0.5 09/16/2012 1215     CBC    Component Value Date/Time   WBC 6.5 02/06/2011 0545   RBC 3.73* 02/06/2011 0545   HGB 13.9 11/19/2011 1415   HCT 41.0 11/19/2011 1415   PLT 110* 02/06/2011 0545   MCV 95.2 02/06/2011 0545   MCH 31.6 02/06/2011 0545   MCHC 33.2 02/06/2011 0545   RDW 12.9 02/06/2011 0545     BNP No results found for this basename: probnp    Lipid Panel  No results found for this basename: chol,  trig,  hdl,  cholhdl,  vldl,  ldlcalc     RADIOLOGY: No results found.    ASSESSMENT AND PLAN:   Melvin [name] is almost 17 years status post CABG surgery.  His last nuclear study in March 2013 continued to show fairly normal perfusion with the exception of a mild anteroseptal defect most likely related to his left bundle branch block. An echo in November 2013 suggested mild/moderate aortic stenosis.  His most recent echo in May shows an ejection fraction of 35-40% similar to his last echo in December 2014.  He does have moderate aortic stenosis due to his reduced LV function, despite a mean gradient of only 17 mm peak gradient of 31.   His palpitations have resolved on his current dose of beta blocker therapy.  I did review blood work from 2 weeks ago, which showed excellent lipid status with a total cholesterol 1:30 triglycerides 60 HDL 40 and LDL  78.  He does have mild microalbuminurea.  I long discussion with Melvin [name].  He does note mild shortness of breath with activity.  There is no presyncope, syncope, or recurrent chest pain.  I discussed with him the potential need for cardiac catheterization and that he may require future aortic valve replacement surgery.  He's not having anginal symptoms.  He will undergo a six-month followup echo Doppler study in November and he will see me in followup of his echo study.  If he does no change in symptomatology, he is to contact our office, so that I see him sooner.     Lennette Bihari, MD, [facility]  01/29/2014 9:08 AM

## 2014-02-14 ENCOUNTER — Ambulatory Visit (INDEPENDENT_AMBULATORY_CARE_PROVIDER_SITE_OTHER): Payer: Medicare Other | Admitting: Ophthalmology

## 2014-02-14 DIAGNOSIS — I10 Essential (primary) hypertension: Secondary | ICD-10-CM

## 2014-02-14 DIAGNOSIS — H35039 Hypertensive retinopathy, unspecified eye: Secondary | ICD-10-CM

## 2014-02-14 DIAGNOSIS — E1165 Type 2 diabetes mellitus with hyperglycemia: Secondary | ICD-10-CM

## 2014-02-14 DIAGNOSIS — E1139 Type 2 diabetes mellitus with other diabetic ophthalmic complication: Secondary | ICD-10-CM

## 2014-02-14 DIAGNOSIS — H3530 Unspecified macular degeneration: Secondary | ICD-10-CM

## 2014-02-14 DIAGNOSIS — H43819 Vitreous degeneration, unspecified eye: Secondary | ICD-10-CM

## 2014-02-14 DIAGNOSIS — H35379 Puckering of macula, unspecified eye: Secondary | ICD-10-CM

## 2014-02-14 DIAGNOSIS — E11319 Type 2 diabetes mellitus with unspecified diabetic retinopathy without macular edema: Secondary | ICD-10-CM

## 2014-03-15 ENCOUNTER — Encounter (INDEPENDENT_AMBULATORY_CARE_PROVIDER_SITE_OTHER): Payer: Self-pay

## 2014-03-15 ENCOUNTER — Ambulatory Visit (INDEPENDENT_AMBULATORY_CARE_PROVIDER_SITE_OTHER): Payer: Medicare Other | Admitting: Neurology

## 2014-03-15 ENCOUNTER — Encounter: Payer: Self-pay | Admitting: Neurology

## 2014-03-15 VITALS — BP 149/59 | HR 55 | Temp 98.2°F | Ht 65.0 in | Wt 194.0 lb

## 2014-03-15 DIAGNOSIS — R609 Edema, unspecified: Secondary | ICD-10-CM

## 2014-03-15 DIAGNOSIS — G2581 Restless legs syndrome: Secondary | ICD-10-CM

## 2014-03-15 DIAGNOSIS — I739 Peripheral vascular disease, unspecified: Secondary | ICD-10-CM

## 2014-03-15 DIAGNOSIS — G609 Hereditary and idiopathic neuropathy, unspecified: Secondary | ICD-10-CM

## 2014-03-15 MED ORDER — GABAPENTIN 300 MG PO CAPS: 300.0000 mg | ORAL_CAPSULE | Freq: Every day | ORAL | Status: DC

## 2014-03-15 NOTE — Progress Notes (Signed)
Subjective:    Patient ID: Melvin [name] is a 80 y.o. male.  HPI    Interim history:   Melvin [name] is a very pleasant 80 year old right-handed gentleman with an underlying complex medical history of type 2 diabetes, osteoporosis, lower extremity edema, left bundle branch block, heart palpitations, aortic stenosis, hypertension, and hyperlipidemia, who presents for followup consultation of his left leg pain, possible atypical RLS in conjunction with neuropathy. He is unaccompanied today. I last saw him on 09/09/2013, at which time he reported feeling fairly stable, taking Horizant qod, to help it last longer d/t cost. He was able to drive to the appointment without having to stop to walk around. He had an echo in 12/14, which showed EF of 35 to 40%. He was advised to continue with the current management. He was recently seen by his cardiologist on 01/24/2014, and I reviewed the notes by Melvin [name]. He had his most recent echocardiogram in May 2015 which showed an EF of 35-40%, similar to the one in December 2014. He has a history of moderate aortic stenosis. His lipid profile has been fine.  Today, he reports that he has been stable, but still has to take the Horizant every other day for cost issues. He has had some neck pain and on the nights he takes his Horizant, his neck pain is better.   I saw him on 03/09/2013, at which time I felt his leg pain was do to a combination of peripheral neuropathy, peripheral vascular disease, chronic edema, and perhaps atypical RLS. He was unfortunately not able to tolerate Requip or Mirapex but had improvement in his pain with both dopamine agonists. Horizant at 300 mg has been somewhat helpful but he had mild morning grogginess.  I last saw him on 12/03/12, at which time he reported side effects from Mirapex even though it had actually helped his left leg pain, he had to stop it. On 09/01/2012 he reported side effects with a Requip, which made him groggy and  he had to stop it. It had helped his leg pain, though.  I first met him on 07/21/2012 at which time a suggested blood work and a trial of Requip for the possibility of atypical RLS affecting only his left leg. He also has lower back pain and radiating pain. MRI lumbar spine in November 2012 did not show any structural lesions. He has had kidney cysts. 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 He has tried Neurontin. He has undergone epidural injections in 2013. At our last visit in 5/14 I suggested a trial of Horizant. He reported improvement in his night time pain and better sleep at night, but some daytime somnolence. He also worried about the cost of the medicine of over $120 per month. He had noted no other side effects.   His Past Medical History Is Significant For: Past Medical History  Diagnosis Date  . High cholesterol   . Unspecified hereditary and idiopathic peripheral neuropathy 10/01/2012  . RLS (restless legs syndrome) 10/01/2012  . Hypertension   . PVC (premature ventricular contraction)     on beta blocker/pt had cardio net monitor 06/23/2012  . PVC (premature ventricular contraction)     10 /20/1998    His Past Surgical History Is Significant For: Past Surgical History  Procedure Laterality Date  . Coronary artery bypass graft      04/1997  . Hiatal hernia  surgery    . Appendectomy    . Doppler echocardiography  05/18/2012    Left  ventriculars systolic function  is mildly reduced. Ejection fraction =45-50 %       . Doppler echocardiography  03 02 2012    LVEF 50-55 % compared to prior echo in 02/2009, the EF apppears low normal with marked incoordinate septal  motion . The aortic  valve gradients have not significantly increased   . Stress test   02 07 2011    the post -stress ejection fraction is 54% Global left  venttricular systolic function function is normal .  no Ekg changes  Nondiagnostic electrocardiogram    His Family History Is Significant For: Family History  Problem Relation Age of Onset  . CAD Mother   . Heart attack Father     His Social History Is Significant For: History   Social History  . Marital Status: Married    Spouse Name: Melvin [name]    Number of Children: 1  . Years of Education: College   Occupational History  .     Social History Main Topics  . Smoking status: Never Smoker   . Smokeless tobacco: Never Used  . Alcohol Use: No  . Drug Use: No  . Sexual Activity: None   Other Topics Concern  . None   Social History Narrative   Pt lives at home with his spouse.   Has been married 30yrs.   Caffeine EGB:TDVVOH, is right handed    His Allergies Are:  Allergies  Allergen Reactions  . Other     Patient is allergic to something but doesn't know the name.  . Pramipexole   :   His Current Medications Are:  Outpatient Encounter Prescriptions as of 03/15/2014  Medication Sig  . amLODipine-atorvastatin (CADUET) 10-40 MG per tablet Take 1 tablet by mouth daily.  Marland Kitchen aspirin 81 MG tablet Take 81 mg by mouth daily.  Marland Kitchen atenolol (TENORMIN) 100 MG tablet Take 1 tablet (100 mg total) by mouth 2 (two) times daily.  . Calcium-Vitamin D (CALTRATE 600 PLUS-VIT D PO) Take 1 tablet by mouth daily.   . clopidogrel (PLAVIX) 75 MG tablet Take 1 tablet (75 mg total) by mouth daily.  . Gabapentin Enacarbil ER (HORIZANT) 300 MG TB24 Take 300 mg by mouth daily with supper.  . losartan (COZAAR) 50 MG tablet Take 1 tablet (50 mg total) by mouth 2 (two) times daily.  . metformin (FORTAMET) 1000 MG (OSM) 24 hr tablet Take 1,000 mg by mouth 2 (two) times daily with a meal.  . Multiple Vitamins-Minerals (PRESERVISION AREDS 2 PO) Take 1 capsule by mouth 2 (two) times daily.   . multivitamin-iron-minerals-folic acid (CENTRUM) chewable tablet Chew 1 tablet by mouth daily.  . niacin (NIASPAN) 750 MG CR tablet Take 2 tablets (1,500 mg total) by  mouth at bedtime.  Marland Kitchen NITROSTAT 0.4 MG SL tablet   . polycarbophil (FIBERCON) 625 MG tablet Take 625 mg by mouth daily.  Marland Kitchen torsemide (DEMADEX) 20 MG tablet Take 1 tablet (20 mg total) by mouth 2 (two) times daily.  . zoledronic acid (RECLAST) 5 MG/100ML SOLN Inject 100 mLs (5 mg total) into the vein once.  . [DISCONTINUED] aspirin 325 MG tablet Take 325 mg by mouth daily.  :  Review of Systems:  Out of a complete 14 point review of systems, all are reviewed and negative with the exception of these symptoms as listed below:  Review of Systems  Respiratory: Positive for  chest tightness.   Cardiovascular: Positive for chest pain, palpitations and leg swelling.  Musculoskeletal: Positive for back pain.    Objective:  Neurologic Exam  Physical Exam Physical Examination:   Filed Vitals:   03/15/14 1440  BP: 149/59  Pulse: 55  Temp: 98.2 F (36.8 C)    General Examination: The patient is a very pleasant 80 y.o. male in no acute distress.   HEENT exam: Normocephalic, atraumatic, face is symmetric with normal facial animation noted. Neck is supple with full range of motion. Hearing is grossly intact. Pupils are equal, round and reactive to light and accommodation. Funduscopy is difficult b/l, he has cataract on the R. Extraocular tracking is good without nystagmus. Speech is clear. Airway examination reveals mild mouth dryness and normal airway findings.  Chest is clear to auscultation, with no wheezing, crackles or rhonchi noted.  Heart sounds are normal with the exception of a 0/3-5/0 systolic murmur audible, unchanged.  Abdomen soft, nontender with positive bowel sounds.  Skin examination reveals skin to be warm and dry. There are hyperpigmentations distal lower extremities and scars from prior vein harvesting. He has 1+ pitting edema in the distal lower extremities to his mid shin areas bilaterally. Neurologically: Mental status: The patient is awake, alert and oriented in all 3  spheres. His memory, attention, language and knowledge are appropriate. Cranial nerves are as described under HEENT exam. Motor-wise he has normal bulk strength and tone throughout with the exception of 4+ out of 5 weakness in his left hip flexor, unchanged. Reflexes are 1+ in the upper extremities trace in both knees and absent in both ankles, unchanged. Sensory exam reveals decreased sensation to vibration, pinprick and temperature sense in both distal lower extremities left worse than right, b/l up to mid shin areas. He has normal sensation in his hands and face. He has no drift and no tremor. Romberg testing reveals mild swaying but no corrective steps. His gait, station and balance are unremarkable for age. He does walk cautiously.   Assessment and Plan:   In summary, Mr. Ruggiero is a 80 yo gentleman with an approx. 3 year history of L leg pain, most likely due to multiple issues: peripheral neuropathy, PVD, chronic leg edema and RLS. He was not able to tolerate Requip or Mirapex, but had improvement in his pain with both DAs. Horizant, which I started in 5/14, at 300 mg at bedtime has been helpful, but it is too expensive, and he is not able to afford taking it daily. He also has had some residual morning sedation sometimes. Previous electrodiagnostic data suggesting demyelinating and axonal neuropathy in the left lower extremity. His neuropathy may have become a little worse. It affects both lower extremities, L > R, and he has PVD, veins harvested from both legs, diabetes and peripheral edema. His exam has otherwise remained stable. I would like to see  if he can take generic gabapentin 300 mg each night in lieu of Horizant, due to cost. I suggested a 30 day prescription first to see how it goes. I've asked him to call back in about 2-3 weeks for an update at which time we can certainly switch his prescriptions for 90 days if all goes well. I talked to him about potential side effects including sedation  and confusion as well as balance problems. I gave him written instructions and [facility]. I would like to see him back in 6 months from now, sooner if the need  arises and asked him to call us with any interim questions, concerns, problems, updates or refill requests. He and his wife were in agreement.

## 2014-03-15 NOTE — Patient Instructions (Addendum)
We will try you on generic gabapentin 300 mg each night in lieu of Horizant for cost issue and see how it goes. Call in about 2-3 weeks as to how you are doing, ask for Dr. Teofilo Pod assistant.   We may be able to switch to a 90 day prescription if all goes well.   The most common side effects reported are sedation or sleepiness. Rare side effects include balance problems, confusion.

## 2014-04-01 ENCOUNTER — Encounter (HOSPITAL_COMMUNITY): Payer: Self-pay | Admitting: *Deleted

## 2014-04-01 ENCOUNTER — Other Ambulatory Visit (HOSPITAL_COMMUNITY): Payer: Self-pay | Admitting: Cardiovascular Disease

## 2014-04-01 DIAGNOSIS — I739 Peripheral vascular disease, unspecified: Secondary | ICD-10-CM

## 2014-04-18 ENCOUNTER — Encounter: Payer: Self-pay | Admitting: *Deleted

## 2014-04-20 ENCOUNTER — Ambulatory Visit (HOSPITAL_COMMUNITY)
Admission: RE | Admit: 2014-04-20 | Discharge: 2014-04-20 | Disposition: A | Discharge status: Home / Self Care | Payer: Medicare Other | Source: Ambulatory Visit | Attending: Cardiology | Admitting: Cardiology

## 2014-04-20 DIAGNOSIS — I739 Peripheral vascular disease, unspecified: Secondary | ICD-10-CM | POA: Insufficient documentation

## 2014-04-20 NOTE — Progress Notes (Signed)
Lower Extremity Arterial Duplex Completed. °Brianna L Mazza,RVT °

## 2014-04-25 ENCOUNTER — Ambulatory Visit (INDEPENDENT_AMBULATORY_CARE_PROVIDER_SITE_OTHER): Payer: Medicare Other | Admitting: Cardiology

## 2014-04-25 VITALS — BP 132/70 | HR 57 | Ht 67.75 in | Wt 176.1 lb

## 2014-04-25 DIAGNOSIS — R000 Tachycardia, unspecified: Secondary | ICD-10-CM

## 2014-04-25 DIAGNOSIS — R0602 Shortness of breath: Secondary | ICD-10-CM

## 2014-04-25 DIAGNOSIS — I447 Left bundle-branch block, unspecified: Secondary | ICD-10-CM

## 2014-04-25 DIAGNOSIS — Z23 Encounter for immunization: Secondary | ICD-10-CM

## 2014-04-25 DIAGNOSIS — I495 Sick sinus syndrome: Secondary | ICD-10-CM

## 2014-04-25 DIAGNOSIS — I350 Nonrheumatic aortic (valve) stenosis: Secondary | ICD-10-CM

## 2014-04-25 NOTE — Progress Notes (Signed)
04/26/2014   PCP: Colette RibasGOLDING, [name], MD   Chief Complaint  Patient presents with  . Follow-up    follow-up LEA, pt c/o heart pounding and increase and decrease in blood pressure.    Primary Cardiologist:Dr. Bishop Limbo. Kelly  PV: Dr. Erlene QuanJ. Berry   HPI:  80 year old male last seen by Dr. Tresa EndoKelly in July this year.  Pt here today after his wife discussed the pt having problems with Dr. Bishop Limbo. Kelly.   He has been having increasing SOB even just sitting in a chair.  He also complains of episodic tachycardia.  This is not always associated with the SOB.  The heart racing occurs from minutes to hours.  His BP is labile at home.  He brought a BP diary 170.87 to 133/63, pulse mostly 48-53.  He denies chest pain.  The SOB does not awaken him from sleep.  His HR has been in the 50-48 for some time.  No syncope.  He has a history of ventricular ectopy and has been on beta blocker therapy since the late 1980s with ectopy suppression. He has CAD and In 1991 he underwent PTCA of his LAD. In October 1998 he underwent CABG surgery x5 by Dr. Dorris FetchHendrickson (LIMA to LAD, sequential vein to the PDA and PLA, vein to the OM 1, vein to the second diagonal vessel). He has chronic left bundle branch block, and also has been followed for aortic valve stenosis with aortic insufficiency which previously has been in the mild to moderate range. He has peripheral vascular disease and underwent rotational atherectomy of his left popliteal and tibial trunk and angioplasty of focal popliteal lesions. In November 2013 an echo showed an ejection fraction of 45-50% with diastolic dysfunction grade 1. He was felt to have mild to moderate aortic stenosis with mean gradient of 14, maximum gradient of 24, and valve area calculated at 1.2 cm with mild aortic insufficiency.  In December 2013 he had worn a CardioNet monitor which did show frequent ventricular ectopy without any episodes of ventricular tachycardia but he did have multifocal  ectopic complexes.   On 06/10/2013 a followup echo Doppler study showed an ejection fraction of 35-40%. He had abnormal septal motion consistent with dyssynergy due to his bundle branch block. The report was interpreted as mild aortic stenosis and he had a mean gradient of 17 and peak gradient of 29 with a valve area of 1.5 cm. He did have mild to moderate aortic insufficiency. There is mild-to-moderate tricuspid regurgitation left atrium was significantly dilated and he had mild/moderate tricuspid regurgitation.   He underwent a 6 month follow-up echo on 11/15/2013. This showed an ejection fraction of approximately 35- 40% which was unchanged from 06/2013. There was mild LVH with mild focal basal hypertrophy of the septum. Systolic function was moderately reduced. He was felt to have moderate aortic stenosis with mild AR. Mean gradient was 7 mm, and peak transvalvular aortic gradient was 31 mm. However, because of his moderate LV dysfunction, aortic valve area was 1.14 cm, compatible with moderate as. There is also mild mitral regurgitation. He ventricular dyssynergy due to his chronic left bundle branch block. Estimated PA pressure was 37 mm. Plan had been to repeat Echo before next appt. Concern is for worsening AS.  He had lower ext. Art. Dopplers that are stable. Hx of PAD with Successful Diambondback orbital rotational atherectomy, percutaneous transluminal angioplasty of high-grade calcified popliteal and tibioperoneal stenoses for lifestyle-limiting claudication.  Allergies  Allergen Reactions  . Other     Patient is allergic to something but doesn't know the name.  . Pramipexole     Current Outpatient Prescriptions  Medication Sig Dispense Refill  . amLODipine-atorvastatin (CADUET) 10-40 MG per tablet Take 1 tablet by mouth daily.  90 tablet  3  . aspirin 81 MG tablet Take 81 mg by mouth daily.      Marland Kitchen. atenolol (TENORMIN) 100 MG tablet Take 1 tablet (100 mg total) by mouth 2 (two)  times daily.  180 tablet  3  . Calcium-Vitamin D (CALTRATE 600 PLUS-VIT D PO) Take 1 tablet by mouth daily.       . clopidogrel (PLAVIX) 75 MG tablet Take 1 tablet (75 mg total) by mouth daily.  90 tablet  2  . gabapentin (NEURONTIN) 300 MG capsule Take 1 capsule (300 mg total) by mouth at bedtime.  30 capsule  3  . losartan (COZAAR) 50 MG tablet Take 1 tablet (50 mg total) by mouth 2 (two) times daily.  180 tablet  3  . metformin (FORTAMET) 1000 MG (OSM) 24 hr tablet Take 1,000 mg by mouth 2 (two) times daily with a meal.      . Multiple Vitamins-Minerals (PRESERVISION AREDS 2 PO) Take 1 capsule by mouth 2 (two) times daily.       . multivitamin-iron-minerals-folic acid (CENTRUM) chewable tablet Chew 1 tablet by mouth daily.      . niacin (NIASPAN) 750 MG CR tablet Take 2 tablets (1,500 mg total) by mouth at bedtime.  180 tablet  3  . NITROSTAT 0.4 MG SL tablet       . polycarbophil (FIBERCON) 625 MG tablet Take 625 mg by mouth daily.      Marland Kitchen. torsemide (DEMADEX) 20 MG tablet Take 1 tablet (20 mg total) by mouth 2 (two) times daily.  180 tablet  3  . zoledronic acid (RECLAST) 5 MG/100ML SOLN Inject 100 mLs (5 mg total) into the vein once.  100 mL  0  . ketorolac (ACULAR) 0.5 % ophthalmic solution Starting on Oct 24th      . ofloxacin (OCUFLOX) 0.3 % ophthalmic solution Starting on October 24th      . prednisoLONE acetate (PRED FORTE) 1 % ophthalmic suspension Starting on October 24th       No current facility-administered medications for this visit.    Past Medical History  Diagnosis Date  . High cholesterol   . Unspecified hereditary and idiopathic peripheral neuropathy 10/01/2012  . RLS (restless legs syndrome) 10/01/2012  . Hypertension   . PAD (peripheral artery disease)   . PVC (premature ventricular contraction) 04/26/1997    on beta blocker/pt had cardio net monitor 06/23/2012  . S/P CABG x 5 1998  . LBBB (left bundle branch block)     Past Surgical History  Procedure Laterality  Date  . Coronary artery bypass graft      04/1997  . Hiatal hernia repair    . Appendectomy    . Doppler echocardiography  05/18/2012    EF 45-50%, LV systolic function mildly reduced; borderline LA enlargment; mild mitral annular calcif, mild-mod MR; mild TR with normal RVSP; mild calcification of AV leaflets and mild-mod valvular AS with mild regurg      . Doppler echocardiography  08/19/2011    EF 50-55%, mod conc LVH; mild mitral annular calcif with mild MR; mild-mod TR; AV mildly sclerotic with mild valvular AS and mild regurg, mild aortic root dilatation  .  Nm myocar perf ejection fraction  09/2011    lexiscan - no reversible ischmai, fixed anteroseptal defect r/t LBBB; EF 51%; septal hypokinesis; low risk but abnormal  . Cardiac catheterization  04/26/1997    CAD - 50-60% prox to mid LAD with 70% stenosis in mid-distal, 70-80% 1st diag stenosis, 50-60% ostial stenosis in large 2nd diagonal; diffuse RCA stenosis with 40-50% osital narrowing with 70% prox stenosis (Dr. Bishop Limbo) >> CABG  . Coronary artery bypass graft  04/1997    CABGx5 - LIMA to LAD, SVG to PDA, SVG to PLA, VG to OM1, VG to 2nd diagonal (Dr. Dorris Fetch)  . Lower extremity arterial doppler  02/2012    RLE - mild to mod arterial insuff; RSFA 50-69% diameter reduction; R pop 50-69% diameter reduction; posterior tibial (R) demonstrates occlusive disease; LSFA/prox pop 0-49% diameter reduction; L distal pop 50-69% diameter reduction; posterior tibial (L) demonstrates occlusive disease  . Lower extremity angiogram  02/05/2011    Diamondback orbital rotational atherectomy, percutaneous transluminal angioplasty of high-grade calcified popliteal & tibioperoneal stenosis (Dr. Erlene Quan)    FAO:ZHYQMVH:QI colds or fevers, no weight changes Skin:no rashes or ulcers HEENT:no blurred vision, no congestion CV:see HPI PUL:see HPI GI:no diarrhea constipation or melena, no indigestion GU:no hematuria, no dysuria MS:no joint pain, no  claudication Neuro:no syncope, no lightheadedness Endo:no diabetes, no thyroid disease  Wt Readings from Last 3 Encounters:  04/25/14 176 lb 1.6 oz (79.878 kg)  03/15/14 194 lb (87.998 kg)  01/24/14 173 lb 1.6 oz (78.518 kg)    PHYSICAL EXAM BP 132/70  Pulse 57  Ht 5' 7.75" (1.721 m)  Wt 176 lb 1.6 oz (79.878 kg)  BMI 26.97 kg/m2 General:Pleasant affect, NAD Skin:Warm and dry, brisk capillary refill HEENT:normocephalic, sclera clear, mucus membranes moist Neck:supple, no JVD, no bruits  Heart:S1S2 RRR with soft murmur 2/6, no gallup, rub or click Lungs:clear without rales, rhonchi, or wheezes ONG:EXBM, non tender, + BS, do not palpate liver spleen or masses Ext:no lower ext edema, 2+ pedal pulses, 2+ radial pulses Neuro:alert and oriented, MAE, follows commands, + facial symmetry EKG: S brady at 57, LBBB, no acute changes from previously  ASSESSMENT AND PLAN SOB (shortness of breath) Concern this is symptom of his AS.  Will recheck echo.  He has appt with Dr. Bishop Limbo in Nov, if AS increased will discuss plan with Dr. Bishop Limbo.   No rales in lungs.   Tachycardia More episodes of tachycardia.  Will add event monitor, with his stable bradycardia unable to increase meds.  Briefly discussed tachy brady syndrome and possible need for future pacemaker.   Aortic stenosis Recheck echo  LBBB (left bundle branch block) stable   Pt rec'd flu vaccine today.

## 2014-04-25 NOTE — Patient Instructions (Signed)
Your physician recommends that you schedule a follow-up appointment in: Keep Appointment with Dr Tresa EndoKelly in November  Your physician has requested that you have an echocardiogram. Echocardiography is a painless test that uses sound waves to create images of your heart. It provides your doctor with information about the size and shape of your heart and how well your heart's chambers and valves are working. This procedure takes approximately one hour. There are no restrictions for this procedure.  Your physician has recommended that you wear an event monitor. Event monitors are medical devices that record the heart's electrical activity. Doctors most often us these monitors to diagnose arrhythmias. Arrhythmias are problems with the speed or rhythm of the heartbeat. The monitor is a small, portable device. You can wear one while you do your normal daily activities. This is usually used to diagnose what is causing palpitations/syncope (passing out).2 Weeks

## 2014-04-26 ENCOUNTER — Encounter: Payer: Self-pay | Admitting: Cardiology

## 2014-04-26 DIAGNOSIS — I495 Sick sinus syndrome: Secondary | ICD-10-CM | POA: Insufficient documentation

## 2014-04-26 DIAGNOSIS — R0602 Shortness of breath: Secondary | ICD-10-CM | POA: Insufficient documentation

## 2014-04-26 NOTE — Assessment & Plan Note (Signed)
More episodes of tachycardia.  Will add event monitor, with his stable bradycardia unable to increase meds.  Briefly discussed tachy brady syndrome and possible need for future pacemaker.

## 2014-04-26 NOTE — Assessment & Plan Note (Signed)
Recheck echo

## 2014-04-26 NOTE — Assessment & Plan Note (Signed)
Concern this is symptom of his AS.  Will recheck echo.  He has appt with Dr. Bishop Limbo. Kelly in Nov, if AS increased will discuss plan with Dr. Bishop Limbo. Kelly.   No rales in lungs.

## 2014-04-26 NOTE — Assessment & Plan Note (Signed)
stable °

## 2014-05-03 ENCOUNTER — Encounter: Payer: Self-pay | Admitting: Cardiovascular Disease

## 2014-05-06 ENCOUNTER — Encounter: Payer: Self-pay | Admitting: Cardiovascular Disease

## 2014-05-10 ENCOUNTER — Ambulatory Visit (HOSPITAL_COMMUNITY)
Admission: RE | Admit: 2014-05-10 | Discharge: 2014-05-10 | Disposition: A | Discharge status: Home / Self Care | Payer: Medicare Other | Source: Ambulatory Visit | Attending: Cardiovascular Disease | Admitting: Cardiovascular Disease

## 2014-05-10 DIAGNOSIS — I350 Nonrheumatic aortic (valve) stenosis: Secondary | ICD-10-CM

## 2014-05-10 DIAGNOSIS — R0609 Other forms of dyspnea: Secondary | ICD-10-CM | POA: Diagnosis present

## 2014-05-10 DIAGNOSIS — R0600 Dyspnea, unspecified: Secondary | ICD-10-CM

## 2014-05-10 DIAGNOSIS — I359 Nonrheumatic aortic valve disorder, unspecified: Secondary | ICD-10-CM

## 2014-05-10 NOTE — Progress Notes (Signed)
2D Echo Performed 05/10/2014    Delrick Dehart, RCS  

## 2014-05-11 ENCOUNTER — Encounter: Payer: Self-pay | Admitting: Cardiovascular Disease

## 2014-05-12 ENCOUNTER — Other Ambulatory Visit: Payer: Self-pay | Admitting: Cardiovascular Disease

## 2014-05-13 ENCOUNTER — Other Ambulatory Visit: Payer: Self-pay | Admitting: *Deleted

## 2014-05-13 DIAGNOSIS — I495 Sick sinus syndrome: Secondary | ICD-10-CM

## 2014-05-13 NOTE — Telephone Encounter (Signed)
E sent to pharmacy 

## 2014-05-30 ENCOUNTER — Ambulatory Visit (INDEPENDENT_AMBULATORY_CARE_PROVIDER_SITE_OTHER): Payer: Medicare Other | Admitting: Cardiovascular Disease

## 2014-05-30 ENCOUNTER — Encounter: Payer: Self-pay | Admitting: Cardiovascular Disease

## 2014-05-30 VITALS — BP 140/70 | HR 55 | Ht 68.0 in | Wt 178.0 lb

## 2014-05-30 DIAGNOSIS — I447 Left bundle-branch block, unspecified: Secondary | ICD-10-CM

## 2014-05-30 DIAGNOSIS — I350 Nonrheumatic aortic (valve) stenosis: Secondary | ICD-10-CM

## 2014-05-30 DIAGNOSIS — R000 Tachycardia, unspecified: Secondary | ICD-10-CM

## 2014-05-30 DIAGNOSIS — R002 Palpitations: Secondary | ICD-10-CM

## 2014-05-30 DIAGNOSIS — I10 Essential (primary) hypertension: Secondary | ICD-10-CM

## 2014-05-30 MED ORDER — ATENOLOL 100 MG PO TABS: ORAL_TABLET | ORAL | Status: DC

## 2014-05-30 NOTE — Patient Instructions (Signed)
Your physician has recommended you make the following change in your medication: change the atenolol as directed.  Your physician recommends that you schedule a follow-up appointment in: 4 months or sooner if needed.

## 2014-05-30 NOTE — Progress Notes (Signed)
Patient ID: Melvin [name], male   DOB: 01-27-1928, 80 y.o.   MRN: [id number]    HPI: Melvin [name] is a 80 y.o. male who presents for 4 month cardiology followup evaluation.  Melvin [name] has a history of ventricular ectopy and has been on beta blocker therapy since the late 1980s with ectopy suppression. He has CAD and In 1991 he underwent PTCA of his LAD. In October 1998 he underwent CABG surgery x5 by Dr. Dorris FetchHendrickson (LIMA to LAD, sequential vein to the PDA and PLA, vein to the OM 1, vein to the second diagonal vessel). He has chronic left bundle branch block, and also has been followed for aortic valve stenosis with aortic insufficiency which previously has been in the mild to moderate range. He has peripheral vascular disease and underwent rotational atherectomy of his left popliteal and tibial trunk and angioplasty of focal popliteal lesions. In November 2013 an echo showed an ejection fraction of 45-50% with diastolic dysfunction grade 1. He was felt to have mild to moderate aortic stenosis with mean gradient of 14, maximum gradient of 24, and valve area calculated at 1.2 cm with mild aortic insufficiency.  In December 2013  a CardioNet monitor showed frequent ventricular ectopy without any episodes of ventricular tachycardia but he did have multifocal ectopic complexes.  He had been under significant stress in caring for his wife who has has significant health issues. On 06/10/2013 a followup echo Doppler study  showed an ejection fraction of 35-40%. He had abnormal septal motion consistent with dyssynergy due to his bundle branch block. The report was interpreted as mild aortic stenosis and he had a mean gradient of 17 and peak gradient of 29 with a valve area of 1.5 cm. He did have mild to moderate aortic insufficiency. There is mild-to-moderate tricuspid regurgitation left atrium was significantly dilated and he had mild/moderate tricuspid regurgitation. A follow-up echo on  11/15/2013 showed an ejection fraction of approximately 35- 40% which was unchanged from 06/2013.  There was mild LVH with mild focal basal hypertrophy of the septum.  Systolic function was moderately reduced.  He was felt to have moderate aortic stenosis with mild AR.  Mean gradient was 7 mm, and peak transvalvular aortic gradient was 31 mm.  However, because of his moderate LV dysfunction, aortic valve area was 1.14 cm, compatible with moderate gas.  There is also mild mitral regurgitation.  He ventricular dyssynergy due to his chronic left bundle branch block.  Estimated PA pressure was 37 mm.   [name] denies any recent chest pain.  He underwent a six-month follow-up echo Doppler study on 05/10/2014, which continue to show an ejection fraction of 35-40% with diffuse hypokinesis and septal lateral dyssynchrony consistent with his left bundle branch block.  On this echo.  He was felt to have only mild aortic stenosis with a mean gradient of 15 mm and a valve area of 1.6 cm.  His aorta had mild dilatation.  PA pressure was normal.  There was moderate left atrial dilatation.  He underwent recent lower Schembri Doppler study on 04/20/2014.  This again was abnormal and showed an ABI of 0.621.  The right.  He has one-vessel runoff of the peroneal artery and the posterior and anterior tibial arteries are known to be occluded.  This was not significant.  The change from his prior study.  Melvin [name] had been seen by Nada BoozerLaura Ingold in October which led to the follow-up studies.  At that time.  She also ordered a CardioNet monitor.  This showed occasional isolated PVCs but he did have an atrial or approximately 11 beats on 05/06/2014 with offsetting to sinus bradycardia with interventricular conduction delay.  Melvin [name] denies any presyncope or syncope.  He denies any significant change in shortness of breath.  Past Medical History  Diagnosis Date  . High cholesterol   . Unspecified hereditary and  idiopathic peripheral neuropathy 10/01/2012  . RLS (restless legs syndrome) 10/01/2012  . Hypertension   . PAD (peripheral artery disease)   . PVC (premature ventricular contraction) 04/26/1997    on beta blocker/pt had cardio net monitor 06/23/2012  . S/P CABG x 5 1998  . LBBB (left bundle branch block)     Past Surgical History  Procedure Laterality Date  . Coronary artery bypass graft      04/1997  . Hiatal hernia repair    . Appendectomy    . Doppler echocardiography  05/18/2012    EF 45-50%, LV systolic function mildly reduced; borderline LA enlargment; mild mitral annular calcif, mild-mod MR; mild TR with normal RVSP; mild calcification of AV leaflets and mild-mod valvular AS with mild regurg      . Doppler echocardiography  08/19/2011    EF 50-55%, mod conc LVH; mild mitral annular calcif with mild MR; mild-mod TR; AV mildly sclerotic with mild valvular AS and mild regurg, mild aortic root dilatation  . Nm myocar perf ejection fraction  09/2011    lexiscan - no reversible ischmai, fixed anteroseptal defect r/t LBBB; EF 51%; septal hypokinesis; low risk but abnormal  . Cardiac catheterization  04/26/1997    CAD - 50-60% prox to mid LAD with 70% stenosis in mid-distal, 70-80% 1st diag stenosis, 50-60% ostial stenosis in large 2nd diagonal; diffuse RCA stenosis with 40-50% osital narrowing with 70% prox stenosis (Dr. Bishop Limbo) >> CABG  . Coronary artery bypass graft  04/1997    CABGx5 - LIMA to LAD, SVG to PDA, SVG to PLA, VG to OM1, VG to 2nd diagonal (Dr. Dorris Fetch)  . Lower extremity arterial doppler  02/2012    RLE - mild to mod arterial insuff; RSFA 50-69% diameter reduction; R pop 50-69% diameter reduction; posterior tibial (R) demonstrates occlusive disease; LSFA/prox pop 0-49% diameter reduction; L distal pop 50-69% diameter reduction; posterior tibial (L) demonstrates occlusive disease  . Lower extremity angiogram  02/05/2011    Diamondback orbital rotational atherectomy,  percutaneous transluminal angioplasty of high-grade calcified popliteal & tibioperoneal stenosis (Dr. Erlene Quan)    Allergies  Allergen Reactions  . Other     Patient is allergic to something but doesn't know the name.  . Pramipexole     Current Outpatient Prescriptions  Medication Sig Dispense Refill  . amLODipine-atorvastatin (CADUET) 10-40 MG per tablet Take 1 tablet by mouth daily. 90 tablet 3  . aspirin 81 MG tablet Take 81 mg by mouth daily.    Marland Kitchen atenolol (TENORMIN) 100 MG tablet Take 1 tablet (100 mg total) by mouth 2 (two) times daily. 180 tablet 3  . Calcium-Vitamin D (CALTRATE 600 PLUS-VIT D PO) Take 1 tablet by mouth daily.     . clopidogrel (PLAVIX) 75 MG tablet TAKE 1 TABLET DAILY 90 tablet 1  . gabapentin (NEURONTIN) 300 MG capsule Take 1 capsule (300 mg total) by mouth at bedtime. 30 capsule 3  . ketorolac (ACULAR) 0.5 % ophthalmic solution Starting on Oct 24th    . losartan (COZAAR) 50 MG tablet Take 1 tablet (50 mg total) by  mouth 2 (two) times daily. 180 tablet 3  . metformin (FORTAMET) 1000 MG (OSM) 24 hr tablet Take 1,000 mg by mouth 2 (two) times daily with a meal.    . Multiple Vitamins-Minerals (PRESERVISION AREDS 2 PO) Take 1 capsule by mouth 2 (two) times daily.     . multivitamin-iron-minerals-folic acid (CENTRUM) chewable tablet Chew 1 tablet by mouth daily.    . niacin (NIASPAN) 750 MG CR tablet Take 2 tablets (1,500 mg total) by mouth at bedtime. 180 tablet 3  . NITROSTAT 0.4 MG SL tablet     . ofloxacin (OCUFLOX) 0.3 % ophthalmic solution Starting on October 24th    . polycarbophil (FIBERCON) 625 MG tablet Take 625 mg by mouth daily.    . prednisoLONE acetate (PRED FORTE) 1 % ophthalmic suspension Starting on October 24th    . torsemide (DEMADEX) 20 MG tablet Take 1 tablet (20 mg total) by mouth 2 (two) times daily. 180 tablet 3  . zoledronic acid (RECLAST) 5 MG/100ML SOLN Inject 100 mLs (5 mg total) into the vein once. 100 mL 0   No current  facility-administered medications for this visit.    Socially he is married, retired, and has one child.  He does not routinely exercise. There is no tobacco or alcohol use.  ROS General: Negative; No fevers, chills, or night sweats;  HEENT: Negative; No changes in vision or hearing, sinus congestion, difficulty swallowing Pulmonary: Negative; No cough, wheezing, shortness of breath, hemoptysis Cardiovascular: See HPI;  No change in claudication symptoms GI: Negative; No nausea, vomiting, diarrhea, or abdominal pain GU: Negative; No dysuria, hematuria, or difficulty voiding Musculoskeletal: Negative; no myalgias, joint pain, or weakness Hematologic/Oncology: Negative; no easy bruising, bleeding Endocrine: Negative; no heat/cold intolerance; no diabetes Neuro: Negative; no changes in balance, headaches Skin: Negative; No rashes or skin lesions Psychiatric: Negative; No behavioral problems, depression Sleep: Negative; No snoring, daytime sleepiness, hypersomnolence, bruxism, restless legs, hypnogognic hallucinations, no cataplexy Other comprehensive 14 point system review is negative.ROS is negative for fever chills or night sweats. e.  PE BP 140/70 mmHg  Pulse 55  Ht 5\' 8"  (1.727 m)  Wt 178 lb (80.74 kg)  BMI 27.07 kg/m2   General: Alert, oriented, no distress.  HEENT: Normocephalic, atraumatic. Pupils round and reactive; sclera anicteric;  Nose without nasal septal hypertrophy Mouth/Parynx benign; Mallinpatti scale  2 Neck: No JVD, no carotid bruits with transmitted murmur Chest: No tenderness to palpation Lungs: clear to ausculatation and percussion; no wheezing or rales Heart: RRR, s1 s2 normal 2/6 , somewhat harsh systolic murmur in the aortic area and 1/ 6 diastolic murmur compatible with AS/AR. No rubs thrills or heaves. Abdomen: soft, nontender; no hepatosplenomehaly, BS+; abdominal aorta nontender and not dilated by palpation. Back: No CVA Pulses 2+ Extremities:  Significant improvement in prior ankle edema, Homan's sign negative  Neurologic: grossly nonfocal Psychologic: Normal affect and mood  ECG (independently read by me): Sinus bradycardia 55 bpm with left bundle branch block and repolarization changes  ECG (independently read by me): Sinus bradycardia 57 beats per minute.  Left bundle branch block with repolarization changes.  PR interval is now normal at 162 ms  Prior February 2015 ECG (independently read by me): Sinus bradycardia 58 beats per minute. Left bundle branch block. Borderline first-degree block with a PR interval 208 ms the  Prior ECG of 06/22/2013:  Sinus rhythm at 63 beats per minute with resolution of prior first degree heart block. Left bundle branch block with repolarization changes.  LABS:  BMET    Component Value Date/Time   NA 137 09/22/2013 1455   K 4.2 09/22/2013 1455   CL 96 09/22/2013 1455   CO2 32 09/22/2013 1455   GLUCOSE 107* 09/22/2013 1455   BUN 16 09/22/2013 1455   CREATININE 0.76 09/22/2013 1455   CREATININE 0.8 06/24/2013 1029   CALCIUM 9.5 09/22/2013 1455   GFRNONAA >60 02/06/2011 0545   GFRAA >60 02/06/2011 0545     Hepatic Function Panel     Component Value Date/Time   PROT 6.4 09/16/2012 1215   ALBUMIN 4.3 09/16/2012 1215   AST 18 09/16/2012 1215   ALT 13 09/16/2012 1215   ALKPHOS 46 09/16/2012 1215   BILITOT 0.6 09/16/2012 1215   BILIDIR 0.1 09/16/2012 1215   IBILI 0.5 09/16/2012 1215     CBC    Component Value Date/Time   WBC 6.5 02/06/2011 0545   RBC 3.73* 02/06/2011 0545   HGB 13.9 11/19/2011 1415   HCT 41.0 11/19/2011 1415   PLT 110* 02/06/2011 0545   MCV 95.2 02/06/2011 0545   MCH 31.6 02/06/2011 0545   MCHC 33.2 02/06/2011 0545   RDW 12.9 02/06/2011 0545     BNP No results found for: PROBNP  Lipid Panel  No results found for: CHOL   RADIOLOGY: No results found.    ASSESSMENT AND PLAN:   Melvin [name] is an 80 year old white male who is 17 years status  post CABG surgery.  His last nuclear study in March 2013 continued to show fairly normal perfusion with the exception of a mild anteroseptal defect most likely related to his left bundle branch block.  He has been demonstrated to have mild-to-moderate mild/moderate aortic stenosis with reduced LV function with an ejection fraction of 35-40% which has remained stable over the past several years.  Reviewed his most recent echo Doppler study with him in detail and this does not appear to be significantly changed,  Arguing against significant progression of his aortic valve stenosis.  He has stable but chronic claudication symptoms and has documented one vessel runoff via the peroneal artery of his right lower extremity.  He has adequate wound healing.  I reviewed his recent CardioNet monitor.  He has been taking atenolol 100 mg in the morning and 100 mg in the evening.  With his documented ectopy I will attempt to titrate this to 125 mg in the morning but he will continue with 100 mg at bedtime.  His blood pressure today is controlled without orthostatic change on his atenolol, losartan 50 twice a day as well as Caduet 10/40 mg  regimen.  He is tolerating the Lipitor component of CADUET with reference to his hyperlipidemia.  He is on aspirin and Plavix, which is beneficial both for his underlying CAD as well as PVD.  As long as he remains stable I will see him in 4 months for follow-up evaluation.  Lennette Bihari, MD, [facility]  05/30/2014 12:56 PM

## 2014-06-01 ENCOUNTER — Telehealth: Payer: Self-pay | Admitting: *Deleted

## 2014-06-01 MED ORDER — ATENOLOL 100 MG PO TABS: 100.0000 mg | ORAL_TABLET | Freq: Two times a day (BID) | ORAL | Status: DC

## 2014-06-01 MED ORDER — ATENOLOL 25 MG PO TABS: 25.0000 mg | ORAL_TABLET | ORAL | Status: DC

## 2014-06-01 NOTE — Telephone Encounter (Signed)
Received [facility] from [facility] regarding the atenolol dosage. Spoke with pt, he is having trouble getting the 1/4 of a tablet. A new script will be sent into the pharm for atenolol 100 mg bis and atenolol 25 mg every morning. Patient voiced understanding of the medication changes.

## 2014-06-07 ENCOUNTER — Encounter: Payer: Self-pay | Admitting: Cardiovascular Disease

## 2014-06-12 ENCOUNTER — Other Ambulatory Visit: Payer: Self-pay | Admitting: Cardiovascular Disease

## 2014-06-13 NOTE — Telephone Encounter (Signed)
Rx has been sent to the pharmacy electronically. ° °

## 2014-06-24 ENCOUNTER — Encounter: Payer: Self-pay | Admitting: Endocrinology

## 2014-06-24 ENCOUNTER — Ambulatory Visit (INDEPENDENT_AMBULATORY_CARE_PROVIDER_SITE_OTHER): Payer: Medicare Other | Admitting: Endocrinology

## 2014-06-24 VITALS — BP 128/68 | HR 56 | Temp 98.0°F | Resp 14 | Ht 67.0 in | Wt 179.8 lb

## 2014-06-24 DIAGNOSIS — M810 Age-related osteoporosis without current pathological fracture: Secondary | ICD-10-CM

## 2014-06-24 NOTE — Progress Notes (Signed)
Patient ID: Melvin [name], male   DOB: 1927/08/02, 80 y.o.   MRN: [id number]   Chief complaint: Low back pain, follow-up osteoporosis  History of Present Illness:   He has had low back pain for several years. His bones appeared osteopenic on x-rays in 2004 and the bone density initially showed T score of -3.0 and the spine and -2.7 at the hips.   No history of any fractures and none found on x-rays. Initially tried on Fosamax which he could not tolerate  In 2005 was given Forteo for 2 years because of a decline in lumbar bone density while taking Actonel and subsequently started back on Actonel in 2007. Bone density improved significantly with Forteo He has been given Reclast infusions since 2011 which he has tolerated; last infusion was in 10/2013   With above treatments his last bone density had improved and subsequently stabilized in 2013 with T score -2.4 at the spine and -1.9 at the hip His height now is about the same as before at 5 feet 7 inches Vitamin D level has been adequate; did have high normal calcium without increased PTH previously Recent calcium level from PCP is normal at 9.5  He continues to have low back pain but has had significant degenerative disc disease and osteoarthritis also. Has not seen an orthopedic doctor recently     Medication List       This list is accurate as of: 06/24/14 11:59 PM.  Always use your most recent med list.               amLODipine-atorvastatin 10-40 MG per tablet  Commonly known as:  CADUET  TAKE 1 TABLET DAILY (DISCONTINUE AMLODIPINE/BENAZEPRIL)     aspirin 81 MG tablet  Take 81 mg by mouth daily.     atenolol 100 MG tablet  Commonly known as:  TENORMIN  Take 1 tablet (100 mg total) by mouth 2 (two) times daily.     atenolol 25 MG tablet  Commonly known as:  TENORMIN  Take 1 tablet (25 mg total) by mouth every morning.     CALTRATE 600 PLUS-VIT D PO  Take 1 tablet by mouth daily.     clopidogrel 75 MG tablet   Commonly known as:  PLAVIX  TAKE 1 TABLET DAILY     gabapentin 300 MG capsule  Commonly known as:  NEURONTIN  Take 1 capsule (300 mg total) by mouth at bedtime.     ketorolac 0.5 % ophthalmic solution  Commonly known as:  ACULAR  Starting on Oct 24th     losartan 50 MG tablet  Commonly known as:  COZAAR  Take 1 tablet (50 mg total) by mouth 2 (two) times daily.     metformin 1000 MG (OSM) 24 hr tablet  Commonly known as:  FORTAMET  Take 1,000 mg by mouth 2 (two) times daily with a meal.     multivitamin-iron-minerals-folic acid chewable tablet  Chew 1 tablet by mouth daily.     PRESERVISION AREDS 2 PO  Take 1 capsule by mouth 2 (two) times daily.     niacin 750 MG CR tablet  Commonly known as:  NIASPAN  Take 2 tablets (1,500 mg total) by mouth at bedtime.     NITROSTAT 0.4 MG SL tablet  Generic drug:  nitroGLYCERIN     ofloxacin 0.3 % ophthalmic solution  Commonly known as:  OCUFLOX  Starting on October 24th     polycarbophil 625 MG tablet  Commonly known  as:  FIBERCON  Take 625 mg by mouth daily.     prednisoLONE acetate 1 % ophthalmic suspension  Commonly known as:  PRED FORTE  Starting on October 24th     torsemide 20 MG tablet  Commonly known as:  DEMADEX  Take 1 tablet (20 mg total) by mouth 2 (two) times daily.     zoledronic acid 5 MG/100ML Soln injection  Commonly known as:  RECLAST  Inject 100 mLs (5 mg total) into the vein once.        Allergies:  Allergies  Allergen Reactions  . Other     Patient is allergic to something but doesn't know the name.  . Pramipexole     Past Medical History  Diagnosis Date  . High cholesterol   . Unspecified hereditary and idiopathic peripheral neuropathy 10/01/2012  . RLS (restless legs syndrome) 10/01/2012  . Hypertension   . PAD (peripheral artery disease)   . PVC (premature ventricular contraction) 04/26/1997    on beta blocker/pt had cardio net monitor 06/23/2012  . S/P CABG x 5 1998  . LBBB (left  bundle branch block)     Past Surgical History  Procedure Laterality Date  . Coronary artery bypass graft      04/1997  . Hiatal hernia repair    . Appendectomy    . Doppler echocardiography  05/18/2012    EF 45-50%, LV systolic function mildly reduced; borderline LA enlargment; mild mitral annular calcif, mild-mod MR; mild TR with normal RVSP; mild calcification of AV leaflets and mild-mod valvular AS with mild regurg      . Doppler echocardiography  08/19/2011    EF 50-55%, mod conc LVH; mild mitral annular calcif with mild MR; mild-mod TR; AV mildly sclerotic with mild valvular AS and mild regurg, mild aortic root dilatation  . Nm myocar perf ejection fraction  09/2011    lexiscan - no reversible ischmai, fixed anteroseptal defect r/t LBBB; EF 51%; septal hypokinesis; low risk but abnormal  . Cardiac catheterization  04/26/1997    CAD - 50-60% prox to mid LAD with 70% stenosis in mid-distal, 70-80% 1st diag stenosis, 50-60% ostial stenosis in large 2nd diagonal; diffuse RCA stenosis with 40-50% osital narrowing with 70% prox stenosis (Dr. Bishop Limbo. Kelly) >> CABG  . Coronary artery bypass graft  04/1997    CABGx5 - LIMA to LAD, SVG to PDA, SVG to PLA, VG to OM1, VG to 2nd diagonal (Dr. Dorris FetchHendrickson)  . Lower extremity arterial doppler  02/2012    RLE - mild to mod arterial insuff; RSFA 50-69% diameter reduction; R pop 50-69% diameter reduction; posterior tibial (R) demonstrates occlusive disease; LSFA/prox pop 0-49% diameter reduction; L distal pop 50-69% diameter reduction; posterior tibial (L) demonstrates occlusive disease  . Lower extremity angiogram  02/05/2011    Diamondback orbital rotational atherectomy, percutaneous transluminal angioplasty of high-grade calcified popliteal & tibioperoneal stenosis (Dr. Erlene QuanJ. Berry)    Family History  Problem Relation Age of Onset  . CAD Mother   . Heart attack Father     Social History:  reports that he has never smoked. He has never used smokeless  tobacco. He reports that he does not drink alcohol or use illicit drugs.  Review of Systems   Diabetes: glucose variable at home but usually fairly good.  However checking these only in the morning A1c 6.7 from PCP recently.   No visits with results within 1 Week(s) from this visit. Latest known visit with results is:  Orders Only  on 09/22/2013  Component Date Value Ref Range Status  . Sodium 09/22/2013 137  135 - 145 mEq/L Final  . Potassium 09/22/2013 4.2  3.5 - 5.3 mEq/L Final  . Chloride 09/22/2013 96  96 - 112 mEq/L Final  . CO2 09/22/2013 32  19 - 32 mEq/L Final  . Glucose, Bld 09/22/2013 107* 70 - 99 mg/dL Final  . BUN 16/04/9603 16  6 - 23 mg/dL Final  . Creat 54/03/8118 0.76  0.50 - 1.35 mg/dL Final  . Calcium 14/78/2956 9.5  8.4 - 10.5 mg/dL Final    EXAM:  BP 213/08 mmHg  Pulse 56  Temp(Src) 98 F (36.7 C)  Resp 14  Ht 5\' 7"  (1.702 m)  Wt 179 lb 12.8 oz (81.557 kg)  BMI 28.15 kg/m2  SpO2 95%  He has significant dorsal kyphosis and prominence of the lower end of the thoracic spine Lumbar spine appears normal  Assessment/Plan:   Osteoporosis, long-standing with variable response to bisphosphonate treatments. Had responded well to [facility] given in 2005 and has been maintaining his bone density since then No recent height loss No history of fractures and clinically doing well except for back pain related to degenerative disease  Bone density needs to be repeated now since it has been 2 years since the last one Consider additional treatment if bone density is any worse He will come back in one year for follow-up  Neleh Muldoon 06/26/2014, 2:11 PM

## 2014-07-19 ENCOUNTER — Other Ambulatory Visit: Payer: Self-pay | Admitting: Neurology

## 2014-08-09 ENCOUNTER — Other Ambulatory Visit: Payer: Self-pay

## 2014-08-09 MED ORDER — LOSARTAN POTASSIUM 50 MG PO TABS: 50.0000 mg | ORAL_TABLET | Freq: Two times a day (BID) | ORAL | Status: DC

## 2014-08-09 NOTE — Telephone Encounter (Signed)
Rx sent to pharmacy   

## 2014-08-31 ENCOUNTER — Telehealth: Payer: Self-pay | Admitting: Neurology

## 2014-08-31 NOTE — Telephone Encounter (Signed)
Called/confirmed and rescheduled appointment due to template change

## 2014-09-13 ENCOUNTER — Ambulatory Visit: Payer: Medicare Other | Admitting: Neurology

## 2014-09-15 ENCOUNTER — Ambulatory Visit: Payer: Self-pay | Admitting: Neurology

## 2014-09-26 ENCOUNTER — Ambulatory Visit (INDEPENDENT_AMBULATORY_CARE_PROVIDER_SITE_OTHER): Payer: Medicare Other | Admitting: Cardiovascular Disease

## 2014-09-26 VITALS — BP 138/70 | HR 54 | Ht 67.0 in | Wt 176.3 lb

## 2014-09-26 DIAGNOSIS — R079 Chest pain, unspecified: Secondary | ICD-10-CM

## 2014-09-26 DIAGNOSIS — I25810 Atherosclerosis of coronary artery bypass graft(s) without angina pectoris: Secondary | ICD-10-CM | POA: Diagnosis not present

## 2014-09-26 DIAGNOSIS — I25118 Atherosclerotic heart disease of native coronary artery with other forms of angina pectoris: Secondary | ICD-10-CM

## 2014-09-26 DIAGNOSIS — I447 Left bundle-branch block, unspecified: Secondary | ICD-10-CM

## 2014-09-26 DIAGNOSIS — I350 Nonrheumatic aortic (valve) stenosis: Secondary | ICD-10-CM

## 2014-09-26 DIAGNOSIS — R600 Localized edema: Secondary | ICD-10-CM

## 2014-09-26 DIAGNOSIS — Z79899 Other long term (current) drug therapy: Secondary | ICD-10-CM

## 2014-09-26 DIAGNOSIS — I10 Essential (primary) hypertension: Secondary | ICD-10-CM

## 2014-09-26 DIAGNOSIS — R0789 Other chest pain: Secondary | ICD-10-CM | POA: Diagnosis not present

## 2014-09-26 NOTE — Patient Instructions (Addendum)
Your physician has requested that you have a lexiscan myoview. For further information please visit [email or web address]. Please follow instruction sheet, as given.   Your physician recommends that you return for lab work before your next appointment.  Your physician recommends that you schedule a follow-up appointment in: 3-4 weeks.

## 2014-09-28 ENCOUNTER — Encounter: Payer: Self-pay | Admitting: Cardiovascular Disease

## 2014-09-28 ENCOUNTER — Other Ambulatory Visit: Payer: Self-pay | Admitting: Cardiovascular Disease

## 2014-09-28 DIAGNOSIS — I2510 Atherosclerotic heart disease of native coronary artery without angina pectoris: Secondary | ICD-10-CM | POA: Insufficient documentation

## 2014-09-28 NOTE — Progress Notes (Signed)
Patient ID: Melvin [name], male   DOB: Oct 19, 1927, 81 y.o.   MRN: [id number]    HPI: Melvin [name] is a 81 y.o. male who presents for 4 month cardiology followup evaluation.  [name] has a history of ventricular ectopy and has been on beta blocker therapy since the late 1980s with ectopy suppression. He has CAD and underwent PTCA of his LAD  in 1991. In October 1998 he underwent CABG surgery x5 by Dr. Roxan Hockey (LIMA to LAD, sequential vein to the PDA and PLA, vein to the OM 1, vein to the second diagonal vessel). He has chronic left bundle branch block, and also has been followed for aortic valve stenosis with aortic insufficiency which previously has been in the mild to moderate range. He has peripheral vascular disease and underwent rotational atherectomy of his left popliteal and tibial trunk and angioplasty of focal popliteal lesions. In November 2013 an echo showed an ejection fraction of 46-65% with diastolic dysfunction grade 1. He was felt to have mild to moderate aortic stenosis with mean gradient of 14, maximum gradient of 24, and valve area calculated at 1.2 cm with mild aortic insufficiency.  In December 2013  a CardioNet monitor showed frequent ventricular ectopy without any episodes of ventricular tachycardia but he did have multifocal ectopic complexes. He had been under significant stress in caring for his wife who has has significant health issues. On 06/10/2013 a followup echo Doppler study  showed an ejection fraction of 35-40%. He had abnormal septal motion consistent with dyssynergy due to his bundle branch block. The report was interpreted as mild aortic stenosis and he had a mean gradient of 17 and peak gradient of 29 with a valve area of 1.5 cm. He did have mild to moderate aortic insufficiency. There is mild-to-moderate tricuspid regurgitation left atrium was significantly dilated and he had mild/moderate tricuspid regurgitation.  A follow-up echo on  11/15/2013 showed an ejection fraction of approximately 35- 40% which was unchanged from 06/2013.  There was mild LVH with mild focal basal hypertrophy of the septum.  Systolic function was moderately reduced.  He was felt to have moderate aortic stenosis with mild AR.  Mean gradient was 7 mm, and peak transvalvular aortic gradient was 31 mm.  However, because of his moderate LV dysfunction, aortic valve area was 1.14 cm, compatible with moderate gas.  There is also mild mitral regurgitation.  He ventricular dyssynergy due to his chronic left bundle branch block.  Estimated PA pressure was 37 mm.   A six-month follow-up echo Doppler study on 05/10/2014 continued to show an ejection fraction of 35-40% with diffuse hypokinesis and septal lateral dyssynchrony consistent with his left bundle branch block.  On this echo he was felt to have only mild aortic stenosis with a mean gradient of 15 mm and a valve area of 1.6 cm.  His aorta had mild dilatation.  PA pressure was normal.  There was moderate left atrial dilatation.  He underwent a lower Doppler study on 04/20/2014 which  showed an ABI of 0.621 on the right.  He has one-vessel runoff of the peroneal artery and the posterior and anterior tibial arteries are known to be occluded.  This was not significantly changed from his prior study.  For the past 4 months, fairly well.  However, last week he developed an episode of chest pressure which radiated to his shoulders and arms.  He took nitroglycerin with relief.  He had another episode of chest pain several  days later.  He has not had any since.  He admits to 1+ ankle swelling.  He is unaware of recurrent palpitations.  He denies presyncope or syncope.  He presents for evaluation.  Past Medical History  Diagnosis Date  . High cholesterol   . Unspecified hereditary and idiopathic peripheral neuropathy 10/01/2012  . RLS (restless legs syndrome) 10/01/2012  . Hypertension   . PAD (peripheral artery disease)     . PVC (premature ventricular contraction) 04/26/1997    on beta blocker/pt had cardio net monitor 06/23/2012  . S/P CABG x 5 1998  . LBBB (left bundle branch block)     Past Surgical History  Procedure Laterality Date  . Coronary artery bypass graft      04/1997  . Hiatal hernia repair    . Appendectomy    . Doppler echocardiography  05/18/2012    EF 04-54%, LV systolic function mildly reduced; borderline LA enlargment; mild mitral annular calcif, mild-mod MR; mild TR with normal RVSP; mild calcification of AV leaflets and mild-mod valvular AS with mild regurg      . Doppler echocardiography  08/19/2011    EF 50-55%, mod conc LVH; mild mitral annular calcif with mild MR; mild-mod TR; AV mildly sclerotic with mild valvular AS and mild regurg, mild aortic root dilatation  . Nm myocar perf ejection fraction  09/2011    lexiscan - no reversible ischmai, fixed anteroseptal defect r/t LBBB; EF 51%; septal hypokinesis; low risk but abnormal  . Cardiac catheterization  04/26/1997    CAD - 50-60% prox to mid LAD with 70% stenosis in mid-distal, 70-80% 1st diag stenosis, 50-60% ostial stenosis in large 2nd diagonal; diffuse RCA stenosis with 40-50% osital narrowing with 70% prox stenosis (Dr. Corky Downs) >> CABG  . Coronary artery bypass graft  04/1997    CABGx5 - LIMA to LAD, SVG to PDA, SVG to PLA, VG to OM1, VG to 2nd diagonal (Dr. Roxan Hockey)  . Lower extremity arterial doppler  02/2012    RLE - mild to mod arterial insuff; RSFA 50-69% diameter reduction; R pop 50-69% diameter reduction; posterior tibial (R) demonstrates occlusive disease; LSFA/prox pop 0-49% diameter reduction; L distal pop 50-69% diameter reduction; posterior tibial (L) demonstrates occlusive disease  . Lower extremity angiogram  02/05/2011    Diamondback orbital rotational atherectomy, percutaneous transluminal angioplasty of high-grade calcified popliteal & tibioperoneal stenosis (Dr. Adora Fridge)    Allergies  Allergen  Reactions  . Other     Patient is allergic to something but doesn't know the name.  . Pramipexole     Current Outpatient Prescriptions  Medication Sig Dispense Refill  . amLODipine-atorvastatin (CADUET) 10-40 MG per tablet TAKE 1 TABLET DAILY (DISCONTINUE AMLODIPINE/BENAZEPRIL) 90 tablet 2  . amoxicillin (AMOXIL) 500 MG capsule FOR DENTAL USE ONLY  3  . aspirin 81 MG tablet Take 81 mg by mouth daily.    Marland Kitchen atenolol (TENORMIN) 100 MG tablet Take 1 tablet (100 mg total) by mouth 2 (two) times daily. 180 tablet 3  . atenolol (TENORMIN) 25 MG tablet Take 1 tablet (25 mg total) by mouth every morning. 180 tablet 3  . Calcium-Vitamin D (CALTRATE 600 PLUS-VIT D PO) Take 1 tablet by mouth daily.     . clopidogrel (PLAVIX) 75 MG tablet TAKE 1 TABLET DAILY 90 tablet 1  . gabapentin (NEURONTIN) 300 MG capsule TAKE 1 CAPSULE AT BEDTIME 90 capsule 0  . losartan (COZAAR) 50 MG tablet Take 1 tablet (50 mg total) by mouth 2 (  two) times daily. 180 tablet 3  . metformin (FORTAMET) 1000 MG (OSM) 24 hr tablet Take 1,000 mg by mouth 2 (two) times daily with a meal.    . Multiple Vitamins-Minerals (PRESERVISION AREDS 2 PO) Take 1 capsule by mouth 2 (two) times daily.     . multivitamin-iron-minerals-folic acid (CENTRUM) chewable tablet Chew 1 tablet by mouth daily.    . niacin (NIASPAN) 750 MG CR tablet Take 2 tablets (1,500 mg total) by mouth at bedtime. 180 tablet 3  . NITROSTAT 0.4 MG SL tablet     . polycarbophil (FIBERCON) 625 MG tablet Take 625 mg by mouth daily.    Marland Kitchen torsemide (DEMADEX) 20 MG tablet Take 1 tablet (20 mg total) by mouth 2 (two) times daily. 180 tablet 3  . zoledronic acid (RECLAST) 5 MG/100ML SOLN Inject 100 mLs (5 mg total) into the vein once. 100 mL 0   No current facility-administered medications for this visit.    Socially he is married, retired, and has one child.  He does not routinely exercise. There is no tobacco or alcohol use.  ROS General: Negative; No fevers, chills, or  night sweats;  HEENT: Negative; No changes in vision or hearing, sinus congestion, difficulty swallowing Pulmonary: Negative; No cough, wheezing, shortness of breath, hemoptysis Cardiovascular: See HPI;  No change in claudication symptoms GI: Negative; No nausea, vomiting, diarrhea, or abdominal pain GU: Negative; No dysuria, hematuria, or difficulty voiding Musculoskeletal: Negative; no myalgias, joint pain, or weakness Hematologic/Oncology: Negative; no easy bruising, bleeding Endocrine: Negative; no heat/cold intolerance; no diabetes Neuro: Negative; no changes in balance, headaches Skin: Negative; No rashes or skin lesions Psychiatric: Negative; No behavioral problems, depression Sleep: Negative; No snoring, daytime sleepiness, hypersomnolence, bruxism, restless legs, hypnogognic hallucinations, no cataplexy Other comprehensive 14 point system review is negative.ROS is negative for fever chills or night sweats. e.  PE BP 138/70 mmHg  Pulse 54  Ht $R'5\' 7"'lG$  (1.702 m)  Wt 176 lb 4.8 oz (79.969 kg)  BMI 27.61 kg/m2   General: Alert, oriented, no distress.  HEENT: Normocephalic, atraumatic. Pupils round and reactive; sclera anicteric;  Nose without nasal septal hypertrophy Mouth/Parynx benign; Mallinpatti scale  2 Neck: No JVD, no carotid bruits with transmitted murmur Chest: No tenderness to palpation Lungs: clear to ausculatation and percussion; no wheezing or rales Heart: RRR, s1 s2 normal 2/6 , 2/6 harsh systolic murmur in the aortic area and 1/ 6 diastolic murmur compatible with AS/AR. No rubs thrills or heaves. Abdomen: soft, nontender; no hepatosplenomehaly, BS+; abdominal aorta nontender and not dilated by palpation. Back: No CVA Pulses 2+ Extremities: Significant improvement in prior ankle edema, Homan's sign negative  Neurologic: grossly nonfocal Psychologic: Normal affect and mood  ECG (independently read by me): Sinus bradycardia 54 bpm.  Left bundle branch block with  repolarization changes.  QTc interval 470 ms.  November 2015 ECG (independently read by me): Sinus bradycardia 55 bpm with left bundle branch block and repolarization changes  ECG (independently read by me): Sinus bradycardia 57 beats per minute.  Left bundle branch block with repolarization changes.  PR interval is now normal at 162 ms  Prior February 2015 ECG (independently read by me): Sinus bradycardia 58 beats per minute. Left bundle branch block. Borderline first-degree block with a PR interval 208 ms the  Prior ECG of 06/22/2013:  Sinus rhythm at 63 beats per minute with resolution of prior first degree heart block. Left bundle branch block with repolarization changes.  LABS:  BMET  BMP  Latest Ref Rng 09/22/2013 09/16/2013 06/24/2013  Glucose 70 - 99 mg/dL 107(H) 84 102(H)  BUN 6 - 23 mg/dL $Remove'16 21 21  'sBOqpqF$ Creatinine 0.50 - 1.35 mg/dL 0.76 0.83 0.8  Sodium 135 - 145 mEq/L 137 137 139  Potassium 3.5 - 5.3 mEq/L 4.2 4.4 4.7  Chloride 96 - 112 mEq/L 96 96 98  CO2 19 - 32 mEq/L 32 31 35(H)  Calcium 8.4 - 10.5 mg/dL 9.5 9.8 10.1     Hepatic Function Panel   Hepatic Function Latest Ref Rng 09/16/2012  Total Protein 6.0 - 8.3 g/dL 6.4  Albumin 3.5 - 5.2 g/dL 4.3  AST 0 - 37 U/L 18  ALT 0 - 53 U/L 13  Alk Phosphatase 39 - 117 U/L 46  Total Bilirubin 0.3 - 1.2 mg/dL 0.6  Bilirubin, Direct 0.0 - 0.3 mg/dL 0.1     CBC  CBC Latest Ref Rng 11/19/2011 02/06/2011  WBC 4.0 - 10.5 K/uL - 6.5  Hemoglobin 13.0 - 17.0 g/dL 13.9 11.8(L)  Hematocrit 39.0 - 52.0 % 41.0 35.5(L)  Platelets 150 - 400 K/uL - 110(L)     BNP No results found for: PROBNP  Lipid Panel  No results found for: CHOL   RADIOLOGY: No results found.    ASSESSMENT AND PLAN:   Mr. Sakuma is an 81 year old white male who is 18 years status post CABG surgery.  His last nuclear study in March 2013 continued to show fairly normal perfusion with the exception of a mild anteroseptal defect most likely related to his  left bundle branch block.  He has been demonstrated to have mild-to-moderate aortic stenosis with reduced LV function with an ejection fraction of 35-40% which has remained stable over the past several years.  He has stable but chronic claudication symptoms and has documented one vessel runoff via the peroneal artery of his right lower extremity.  He has adequate wound healing.  He had a CardioNet monitor last fall, which revealed occasional isolated PVCs but he had an atrial run of approximately 11 beats.  I last saw him, I further titrated his atenolol 100 mg in the morning and 100 mg in the evening to  125 mg in the morning and  100 mg at bedtime.  His blood pressure today is controlled without orthostatic change on his atenolol, losartan 50 twice a day as well as Caduet 10/40 mg  regimen.  He is tolerating the Lipitor component of CADUET with reference to his hyperlipidemia.  He is on aspirin and Plavix, which is beneficial both for his underlying CAD as well as PVD. Marland Kitchen  He has experienced 2 episodes of recent chest pain over the past week.  With his CABG surgery being 18 years ago and his last nuclear study 3 years ago, I am scheduling him for [employer] study.  I discussed support stockings.  He is taking torsemide 20 mg twice a day for his lower extremity edema.  Follow-up laboratory will be obtained in the fasting state.  I will see him in 3-4 weeks for follow-up evaluation and further recommendations will be made at that time.    Time spent: 25 minutes   Troy Sine, MD, [facility]  09/28/2014 4:36 PM

## 2014-09-29 LAB — CBC WITH DIFFERENTIAL/PLATELET
Basophils Absolute: 0.1 10*3/uL (ref 0.0–0.2)
Basos: 1 %
Eos: 4 %
Eosinophils Absolute: 0.2 10*3/uL (ref 0.0–0.4)
HCT: 37.7 % (ref 37.5–51.0)
Hemoglobin: 12.1 g/dL — ABNORMAL LOW (ref 12.6–17.7)
Immature Grans (Abs): 0 10*3/uL (ref 0.0–0.1)
Immature Granulocytes: 0 %
LYMPHS ABS: 1.6 10*3/uL (ref 0.7–3.1)
Lymphs: 29 %
MCH: 31 pg (ref 26.6–33.0)
MCHC: 32.1 g/dL (ref 31.5–35.7)
MCV: 97 fL (ref 79–97)
Monocytes Absolute: 0.9 10*3/uL (ref 0.1–0.9)
Monocytes: 15 %
NEUTROS PCT: 51 %
Neutrophils Absolute: 3 10*3/uL (ref 1.4–7.0)
Platelets: 164 10*3/uL (ref 150–379)
RBC: 3.9 x10E6/uL — ABNORMAL LOW (ref 4.14–5.80)
RDW: 13.5 % (ref 12.3–15.4)
WBC: 5.8 10*3/uL (ref 3.4–10.8)

## 2014-09-29 LAB — COMPREHENSIVE METABOLIC PANEL
ALK PHOS: 55 IU/L (ref 39–117)
ALT: 10 IU/L (ref 0–44)
AST: 19 IU/L (ref 0–40)
Albumin/Globulin Ratio: 1.9 (ref 1.1–2.5)
Albumin: 4.1 g/dL (ref 3.5–4.7)
BILIRUBIN TOTAL: 0.5 mg/dL (ref 0.0–1.2)
BUN/Creatinine Ratio: 20 (ref 10–22)
BUN: 17 mg/dL (ref 8–27)
CALCIUM: 9.3 mg/dL (ref 8.6–10.2)
CO2: 28 mmol/L (ref 18–29)
Chloride: 100 mmol/L (ref 97–108)
Creatinine, Ser: 0.85 mg/dL (ref 0.76–1.27)
GFR calc non Af Amer: 80 mL/min/{1.73_m2} (ref >59)
GFR, EST AFRICAN AMERICAN: 92 mL/min/{1.73_m2} (ref >59)
GLOBULIN, TOTAL: 2.2 g/dL (ref 1.5–4.5)
GLUCOSE: 140 mg/dL — AB (ref 65–99)
POTASSIUM: 4.2 mmol/L (ref 3.5–5.2)
SODIUM: 140 mmol/L (ref 134–144)
Total Protein: 6.3 g/dL (ref 6.0–8.5)

## 2014-09-29 LAB — LIPID PANEL W/O CHOL/HDL RATIO
Cholesterol, Total: 135 mg/dL (ref 100–199)
HDL: 48 mg/dL (ref >39)
LDL CALC: 72 mg/dL (ref 0–99)
Triglycerides: 77 mg/dL (ref 0–149)
VLDL Cholesterol Cal: 15 mg/dL (ref 5–40)

## 2014-09-29 LAB — TSH: TSH: 3.17 u[IU]/mL (ref 0.450–4.500)

## 2014-09-30 ENCOUNTER — Telehealth (HOSPITAL_COMMUNITY): Payer: Self-pay

## 2014-09-30 NOTE — Telephone Encounter (Signed)
Encounter complete. 

## 2014-10-03 ENCOUNTER — Telehealth: Payer: Self-pay | Admitting: *Deleted

## 2014-10-03 NOTE — Telephone Encounter (Signed)
Spoke to patient. Result given . Verbalized understanding Routed labs to Dr Gaynelle ArabianGOLDING-[place]

## 2014-10-03 NOTE — Telephone Encounter (Signed)
-----   Message from Lennette Biharihomas A Kelly, MD sent at 10/01/2014 10:17 AM EDT ----- Labs good x mildly in glu

## 2014-10-05 ENCOUNTER — Ambulatory Visit (HOSPITAL_COMMUNITY)
Admission: RE | Admit: 2014-10-05 | Discharge: 2014-10-05 | Disposition: A | Discharge status: Home / Self Care | Payer: Medicare Other | Source: Ambulatory Visit | Attending: Cardiovascular Disease | Admitting: Cardiovascular Disease

## 2014-10-05 DIAGNOSIS — R0789 Other chest pain: Secondary | ICD-10-CM

## 2014-10-05 DIAGNOSIS — I447 Left bundle-branch block, unspecified: Secondary | ICD-10-CM | POA: Insufficient documentation

## 2014-10-05 DIAGNOSIS — R55 Syncope and collapse: Secondary | ICD-10-CM | POA: Insufficient documentation

## 2014-10-05 DIAGNOSIS — R002 Palpitations: Secondary | ICD-10-CM | POA: Diagnosis not present

## 2014-10-05 DIAGNOSIS — R0609 Other forms of dyspnea: Secondary | ICD-10-CM | POA: Insufficient documentation

## 2014-10-05 DIAGNOSIS — R079 Chest pain, unspecified: Secondary | ICD-10-CM | POA: Diagnosis present

## 2014-10-05 DIAGNOSIS — R42 Dizziness and giddiness: Secondary | ICD-10-CM | POA: Insufficient documentation

## 2014-10-05 DIAGNOSIS — E785 Hyperlipidemia, unspecified: Secondary | ICD-10-CM | POA: Insufficient documentation

## 2014-10-05 DIAGNOSIS — I25810 Atherosclerosis of coronary artery bypass graft(s) without angina pectoris: Secondary | ICD-10-CM

## 2014-10-05 DIAGNOSIS — Z8249 Family history of ischemic heart disease and other diseases of the circulatory system: Secondary | ICD-10-CM | POA: Insufficient documentation

## 2014-10-05 DIAGNOSIS — R5383 Other fatigue: Secondary | ICD-10-CM | POA: Diagnosis not present

## 2014-10-05 DIAGNOSIS — E119 Type 2 diabetes mellitus without complications: Secondary | ICD-10-CM | POA: Diagnosis not present

## 2014-10-05 MED ORDER — AMINOPHYLLINE 25 MG/ML IV SOLN
75.0000 mg | Freq: Once | INTRAVENOUS | Status: AC
  Administered 2014-10-05: 75 mg via INTRAVENOUS

## 2014-10-05 MED ORDER — TECHNETIUM TC 99M SESTAMIBI GENERIC - CARDIOLITE
31.7000 | Freq: Once | INTRAVENOUS | Status: AC | PRN
  Administered 2014-10-05: 31.7 via INTRAVENOUS

## 2014-10-05 MED ORDER — REGADENOSON 0.4 MG/5ML IV SOLN
0.4000 mg | Freq: Once | INTRAVENOUS | Status: AC
  Administered 2014-10-05: 0.4 mg via INTRAVENOUS

## 2014-10-05 MED ORDER — TECHNETIUM TC 99M SESTAMIBI GENERIC - CARDIOLITE
10.9000 | Freq: Once | INTRAVENOUS | Status: AC | PRN
  Administered 2014-10-05: 10.9 via INTRAVENOUS

## 2014-10-05 NOTE — Procedures (Addendum)
[facility] [address] [place] [id number] [phone]  Cardiology Nuclear Med Study  Melvin [name] is a 81 y.o. male     MRN : [id number]     DOB: 1927/10/13  Procedure Date: 10/05/2014  Nuclear Med Background Indication for Stress Test:  Evaluation for Ischemia History:  CAD;CABG X5-04/1997;STENT/PTCA-1991;Last NUC MPI on 09/06/2011;EF=51%;Aortic Stenosis;Tachycardia;PVC's;LEE Cardiac Risk Factors: Family History - CAD, LBBB, Lipids and NIDDM  Symptoms:  Chest Pain, Dizziness, DOE, Fatigue, Light-Headedness, Palpitations, SOB and Syncope   Nuclear Pre-Procedure Caffeine/Decaff Intake:  1:00am NPO After: 9:00am   IV Site: R Forearm  IV 0.9% NS with Angio Cath:  22g  Chest Size (in):  44" IV Started by: Berdie OgrenAmanda Wease, RN  Height: 5\' 7"  (1.702 m)  Cup Size: n/a  BMI:  Body mass index is 27.56 kg/(m^2). Weight:  176 lb (79.833 kg)   Tech Comments:  n/a    Nuclear Med Study 1 or 2 day study: 1 day  Stress Test Type:  Lexiscan  Order Authorizing Provider:  Nicki Guadalajarahomas Kelly, MD   Resting Radionuclide: Technetium 8672m Sestamibi  Resting Radionuclide Dose: 10.9 mCi   Stress Radionuclide:  Technetium 772m Sestamibi  Stress Radionuclide Dose: 31.7 mCi           Stress Protocol Rest HR: 55 Stress HR: 60  Rest BP: 173/74 Stress BP: 174/71  Exercise Time (min): n/a METS: n/a          Dose of Adenosine (mg):  n/a Dose of Lexiscan: 0.4 mg  Dose of Atropine (mg): n/a Dose of Dobutamine: n/a mcg/kg/min (at max HR)  Stress Test Technologist: Esperanza Sheetserry-Marie Martin, CCT Nuclear Technologist: Henrine ScrewsElizabeth Young,CNMT   Rest Procedure:  Myocardial perfusion imaging was performed at rest 45 minutes following the intravenous administration of Technetium 1172m Sestamibi. Stress Procedure:  The patient received IV Lexiscan 0.4 mg over 15-seconds.  Technetium 8672m Sestamibi injected IV at 30-seconds.  The Patient experienced SOB and Nausea and  75 mg Aminophylline IV was administered. There were no significant changes with Lexiscan.  Quantitative spect images were obtained after a 45 minute delay.  Transient Ischemic Dilatation (Normal <1.22):  0.94  QGS EDV:  188 ml QGS ESV:  119 ml LV Ejection Fraction: 37%      Rest ECG: NSR-LBBB  Stress ECG: No significant change from baseline ECG  QPS Raw Data Images:  Normal; no motion artifact; normal heart/lung ratio. Stress Images:  There is a moderate size, moderate severity mid septal perfusion defect; there is a moderate size, moderate severity basal lateral wall defect Rest Images:  there is no change in the septal defect, but there is improvement in the basal lateral defect Subtraction (SDS):  findings suggest LBBB-related septal artifact and basal lateral wall ischemia LV Wall Motion:  moderately reduced systolic function, EF 37% with profound dyssynchrony  Impression Exercise Capacity:  Lexiscan with no exercise. BP Response:  Normal blood pressure response. Clinical Symptoms:  No significant symptoms noted. ECG Impression:  Baseline:  LBBB.  EKG uninterpretable due to LBBB at rest and stress. Comparison with Prior Nuclear Study: compared to 2013, the septal fixed defect is unchanged, but there is new basal lateral ischemia and a marked drop in LV systolic function   Overall Impression:  High risk stress nuclear study with new lateral wall ischemia and moderate LV systolic dysfunction. In addition there is a fixed septal defect, likely LBBB-related artifact.Melvin [name].   Melvin Calzada, MD  10/05/2014 7:00 PM

## 2014-10-06 ENCOUNTER — Other Ambulatory Visit: Payer: Self-pay | Admitting: Neurology

## 2014-10-07 ENCOUNTER — Telehealth: Payer: Self-pay | Admitting: Cardiovascular Disease

## 2014-10-07 NOTE — Telephone Encounter (Signed)
Re: atenolol dosing   Called back, informed [facility] both Atenolol strengths are written for pt. (Current therapy 100mg  BID & 25mg  daily).

## 2014-10-07 NOTE — Telephone Encounter (Signed)
Pharmacist at [facility]  wants to clarify a duplicate on Atenolol wants to know the current therapy is it a 100 or 25 mg. Please call at [phone]. Reference # [id number]

## 2014-10-14 ENCOUNTER — Encounter: Payer: Self-pay | Admitting: Cardiovascular Disease

## 2014-10-14 ENCOUNTER — Ambulatory Visit (INDEPENDENT_AMBULATORY_CARE_PROVIDER_SITE_OTHER): Payer: Medicare Other | Admitting: Cardiovascular Disease

## 2014-10-14 ENCOUNTER — Telehealth: Payer: Self-pay | Admitting: Cardiovascular Disease

## 2014-10-14 ENCOUNTER — Other Ambulatory Visit: Payer: Self-pay | Admitting: *Deleted

## 2014-10-14 VITALS — BP 160/67 | HR 54 | Ht 67.25 in | Wt 177.6 lb

## 2014-10-14 DIAGNOSIS — I10 Essential (primary) hypertension: Secondary | ICD-10-CM

## 2014-10-14 DIAGNOSIS — Z01812 Encounter for preprocedural laboratory examination: Secondary | ICD-10-CM

## 2014-10-14 DIAGNOSIS — Z01818 Encounter for other preprocedural examination: Secondary | ICD-10-CM

## 2014-10-14 DIAGNOSIS — I209 Angina pectoris, unspecified: Secondary | ICD-10-CM

## 2014-10-14 DIAGNOSIS — R9439 Abnormal result of other cardiovascular function study: Secondary | ICD-10-CM

## 2014-10-14 DIAGNOSIS — I447 Left bundle-branch block, unspecified: Secondary | ICD-10-CM

## 2014-10-14 DIAGNOSIS — I25810 Atherosclerosis of coronary artery bypass graft(s) without angina pectoris: Secondary | ICD-10-CM

## 2014-10-14 DIAGNOSIS — R600 Localized edema: Secondary | ICD-10-CM

## 2014-10-14 DIAGNOSIS — Z79899 Other long term (current) drug therapy: Secondary | ICD-10-CM | POA: Diagnosis not present

## 2014-10-14 DIAGNOSIS — I350 Nonrheumatic aortic (valve) stenosis: Secondary | ICD-10-CM

## 2014-10-14 DIAGNOSIS — Z01810 Encounter for preprocedural cardiovascular examination: Secondary | ICD-10-CM

## 2014-10-14 NOTE — Telephone Encounter (Signed)
Please call,she would like to follow up on his office visit today. Please call today if possible,if not please call Monday after 4.

## 2014-10-14 NOTE — Telephone Encounter (Signed)
Called daughter, informed her I do not have DPR on file for patient, cannot discuss care. Caller verbalized understanding.

## 2014-10-14 NOTE — Patient Instructions (Addendum)
Your physician has requested that you have a cardiac catheterization. Cardiac catheterization is used to diagnose and/or treat various heart conditions. Doctors may recommend this procedure for a number of different reasons. The most common reason is to evaluate chest pain. Chest pain can be a symptom of coronary artery disease (CAD), and cardiac catheterization can show whether plaque is narrowing or blocking your heart's arteries. This procedure is also used to evaluate the valves, as well as measure the blood flow and oxygen levels in different parts of your heart. For further information please visit [email or web address]. Please follow instruction sheet, as given.  Your physician recommends that you return for lab work and chest xray within 7 days of the procedure.  Your physician recommends that you schedule a follow-up appointment in: 2 weeks following your procedure. This will be with a nurse practioner or PA and will be scheduled at the time of your discharge'

## 2014-10-14 NOTE — Progress Notes (Signed)
Patient ID: Melvin [name], male   DOB: 12-23-1927, 81 y.o.   MRN: [id number]    HPI: Melvin [name] is a 81 y.o. male who presents for a one month cardiology followup evaluation.  Melvin [name] has a history of ventricular ectopy and has been on beta blocker therapy since the late 1980s with ectopy suppression. He has CAD and underwent PTCA of his LAD  in 1991. In October 1998 he underwent CABG surgery x5 by Dr. Roxan Hockey (LIMA to LAD, sequential vein to the PDA and PLA, vein to the OM 1, vein to the second diagonal vessel). He has chronic left bundle branch block, and also has been followed for aortic valve stenosis with aortic insufficiency which previously has been in the mild to moderate range. He has peripheral vascular disease and underwent rotational atherectomy of his left popliteal and tibial trunk and angioplasty of focal popliteal lesions. In November 2013 an echo showed an ejection fraction of 50-09% with diastolic dysfunction grade 1. He was felt to have mild to moderate aortic stenosis with mean gradient of 14, maximum gradient of 24, and valve area calculated at 1.2 cm with mild aortic insufficiency.  In December 2013  a CardioNet monitor showed frequent ventricular ectopy without any episodes of ventricular tachycardia but he did have multifocal ectopic complexes. He had been under significant stress in caring for his wife who has has significant health issues. On 06/10/2013 a followup echo Doppler study  showed an ejection fraction of 35-40%. He had abnormal septal motion consistent with dyssynergy due to his bundle branch block. The report was interpreted as mild aortic stenosis and he had a mean gradient of 17 and peak gradient of 29 with a valve area of 1.5 cm. He did have mild to moderate aortic insufficiency. There is mild-to-moderate tricuspid regurgitation left atrium was significantly dilated and he had mild/moderate tricuspid regurgitation.  A follow-up  echo on 11/15/2013 showed an ejection fraction of approximately 35- 40% which was unchanged from 06/2013.  There was mild LVH with mild focal basal hypertrophy of the septum.  Systolic function was moderately reduced.  He was felt to have moderate aortic stenosis with mild AR.  Mean gradient was 7 mm, and peak transvalvular aortic gradient was 31 mm.  However, because of his moderate LV dysfunction, aortic valve area was 1.14 cm, compatible with moderate gas.  There is also mild mitral regurgitation.  He ventricular dyssynergy due to his chronic left bundle branch block.  Estimated PA pressure was 37 mm.   A six-month follow-up echo Doppler study on 05/10/2014 continued to show an ejection fraction of 35-40% with diffuse hypokinesis and septal lateral dyssynchrony consistent with his left bundle branch block.  On this echo he was felt to have only mild aortic stenosis with a mean gradient of 15 mm and a valve area of 1.6 cm.  His aorta had mild dilatation.  PA pressure was normal.  There was moderate left atrial dilatation.  He underwent a lower Doppler study on 04/20/2014 which  showed an ABI of 0.621 on the right.  He has one-vessel runoff of the peroneal artery and the posterior and anterior tibial arteries are known to be occluded.  This was not significantly changed from his prior study.  When I saw him one month ago he was complaining that he had experienced two  episode of chest pressure which radiated to his shoulders and arms.  He took nitroglycerin with relief.  He had another episode  of chest pain several days later.  I scheduled him for a nuclear perfusion study which was done on 10/05/2014.  This revealed an ejection fraction at 37%.  The study was interpreted as high risk with new lateral wall ischemia.  In addition to his moderate LV dysfunction.  In addition, there is a fixed septal defect likely related to his left bundle branch block.  He continues to have ankle edema.  He has been on  torsemide 20 mg twice a day but did not take his dose today.  He presents for evaluation.  Past Medical History  Diagnosis Date  . High cholesterol   . Unspecified hereditary and idiopathic peripheral neuropathy 10/01/2012  . RLS (restless legs syndrome) 10/01/2012  . Hypertension   . PAD (peripheral artery disease)   . PVC (premature ventricular contraction) 04/26/1997    on beta blocker/pt had cardio net monitor 06/23/2012  . S/P CABG x 5 1998  . LBBB (left bundle branch block)     Past Surgical History  Procedure Laterality Date  . Coronary artery bypass graft      04/1997  . Hiatal hernia repair    . Appendectomy    . Doppler echocardiography  05/18/2012    EF 33-35%, LV systolic function mildly reduced; borderline LA enlargment; mild mitral annular calcif, mild-mod MR; mild TR with normal RVSP; mild calcification of AV leaflets and mild-mod valvular AS with mild regurg      . Doppler echocardiography  08/19/2011    EF 50-55%, mod conc LVH; mild mitral annular calcif with mild MR; mild-mod TR; AV mildly sclerotic with mild valvular AS and mild regurg, mild aortic root dilatation  . Nm myocar perf ejection fraction  09/2011    lexiscan - no reversible ischmai, fixed anteroseptal defect r/t LBBB; EF 51%; septal hypokinesis; low risk but abnormal  . Cardiac catheterization  04/26/1997    CAD - 50-60% prox to mid LAD with 70% stenosis in mid-distal, 70-80% 1st diag stenosis, 50-60% ostial stenosis in large 2nd diagonal; diffuse RCA stenosis with 40-50% osital narrowing with 70% prox stenosis (Dr. Corky Downs) >> CABG  . Coronary artery bypass graft  04/1997    CABGx5 - LIMA to LAD, SVG to PDA, SVG to PLA, VG to OM1, VG to 2nd diagonal (Dr. Roxan Hockey)  . Lower extremity arterial doppler  02/2012    RLE - mild to mod arterial insuff; RSFA 50-69% diameter reduction; R pop 50-69% diameter reduction; posterior tibial (R) demonstrates occlusive disease; LSFA/prox pop 0-49% diameter reduction; L  distal pop 50-69% diameter reduction; posterior tibial (L) demonstrates occlusive disease  . Lower extremity angiogram  02/05/2011    Diamondback orbital rotational atherectomy, percutaneous transluminal angioplasty of high-grade calcified popliteal & tibioperoneal stenosis (Dr. Adora Fridge)    Allergies  Allergen Reactions  . Other     Patient is allergic to something but doesn't know the name.  . Pramipexole     Current Outpatient Prescriptions  Medication Sig Dispense Refill  . amLODipine-atorvastatin (CADUET) 10-40 MG per tablet TAKE 1 TABLET DAILY (DISCONTINUE AMLODIPINE/BENAZEPRIL) 90 tablet 2  . amoxicillin (AMOXIL) 500 MG capsule FOR DENTAL USE ONLY  3  . aspirin 81 MG tablet Take 81 mg by mouth daily.    Marland Kitchen atenolol (TENORMIN) 100 MG tablet Take 1 tablet (100 mg total) by mouth 2 (two) times daily. 180 tablet 3  . atenolol (TENORMIN) 25 MG tablet Take 1 tablet (25 mg total) by mouth every morning. 180 tablet 3  .  Calcium-Vitamin D (CALTRATE 600 PLUS-VIT D PO) Take 1 tablet by mouth daily.     . clopidogrel (PLAVIX) 75 MG tablet TAKE 1 TABLET DAILY 90 tablet 1  . gabapentin (NEURONTIN) 300 MG capsule TAKE 1 CAPSULE AT BEDTIME 90 capsule 0  . losartan (COZAAR) 50 MG tablet Take 1 tablet (50 mg total) by mouth 2 (two) times daily. 180 tablet 3  . metformin (FORTAMET) 1000 MG (OSM) 24 hr tablet Take 1,000 mg by mouth 2 (two) times daily with a meal.    . Multiple Vitamins-Minerals (PRESERVISION AREDS 2 PO) Take 1 capsule by mouth 2 (two) times daily.     . multivitamin-iron-minerals-folic acid (CENTRUM) chewable tablet Chew 1 tablet by mouth daily.    . niacin (NIASPAN) 750 MG CR tablet Take 2 tablets (1,500 mg total) by mouth at bedtime. 180 tablet 3  . NITROSTAT 0.4 MG SL tablet     . polycarbophil (FIBERCON) 625 MG tablet Take 625 mg by mouth daily.    Marland Kitchen torsemide (DEMADEX) 20 MG tablet Take 1 tablet (20 mg total) by mouth 2 (two) times daily. 180 tablet 3  . zoledronic acid  (RECLAST) 5 MG/100ML SOLN Inject 100 mLs (5 mg total) into the vein once. 100 mL 0   No current facility-administered medications for this visit.    Socially he is married, retired, and has one child.  He does not routinely exercise. There is no tobacco or alcohol use.  ROS General: Negative; No fevers, chills, or night sweats;  HEENT: Negative; No changes in vision or hearing, sinus congestion, difficulty swallowing Pulmonary: Negative; No cough, wheezing, shortness of breath, hemoptysis Cardiovascular: See HPI;  GI: Negative; No nausea, vomiting, diarrhea, or abdominal pain GU: Negative; No dysuria, hematuria, or difficulty voiding Musculoskeletal: Negative; no myalgias, joint pain, or weakness Hematologic/Oncology: Negative; no easy bruising, bleeding Endocrine: Negative; no heat/cold intolerance; no diabetes Neuro: Negative; no changes in balance, headaches Skin: Negative; No rashes or skin lesions Psychiatric: Negative; No behavioral problems, depression Sleep: Negative; No snoring, daytime sleepiness, hypersomnolence, bruxism, restless legs, hypnogognic hallucinations, no cataplexy Other comprehensive 14 point system review is negative.   PE BP 160/67 mmHg  Pulse 54  Ht 5' 7.25" (1.708 m)  Wt 177 lb 9.6 oz (80.559 kg)  BMI 27.61 kg/m2  General: Alert, oriented, no distress.  HEENT: Normocephalic, atraumatic. Pupils round and reactive; sclera anicteric;  Nose without nasal septal hypertrophy Mouth/Parynx benign; Mallinpatti scale  2 Neck: No JVD, no carotid bruits with transmitted murmur Chest: No tenderness to palpation Lungs: clear to ausculatation and percussion; no wheezing or rales Heart: RRR, s1 s2 normal 2/6 , 2/6 harsh systolic murmur in the aortic area and 1/ 6 diastolic murmur compatible with AS/AR. No rubs thrills or heaves. Abdomen: soft, nontender; no hepatosplenomehaly, BS+; abdominal aorta nontender and not dilated by palpation. Back: No CVA Pulses  2+ Extremities: 1-2+ ankle swelling but he did not take his diuretic this morning., Homan's sign negative  Neurologic: grossly nonfocal Psychologic: Normal affect and mood   March 2016 ECG (independently read by me): Sinus bradycardia 54 bpm.  Left bundle branch block with repolarization changes.  QTc interval 470 ms.  November 2015 ECG (independently read by me): Sinus bradycardia 55 bpm with left bundle branch block and repolarization changes  ECG (independently read by me): Sinus bradycardia 57 beats per minute.  Left bundle branch block with repolarization changes.  PR interval is now normal at 162 ms  Prior February 2015 ECG (independently read  by me): Sinus bradycardia 58 beats per minute. Left bundle branch block. Borderline first-degree block with a PR interval 208 ms the  Prior ECG of 06/22/2013:  Sinus rhythm at 63 beats per minute with resolution of prior first degree heart block. Left bundle branch block with repolarization changes.  LABS:  BMP Latest Ref Rng 09/28/2014 09/22/2013 09/16/2013  Glucose 65 - 99 mg/dL 140(H) 107(H) 84  BUN 8 - 27 mg/dL _0 Creatinine 0.76 - 1.27 mg/dL 0.85 0.76 0.83  BUN/Creat Ratio 10 - 22 20 - -  Sodium 134 - 144 mmol/L 140 137 137  Potassium 3.5 - 5.2 mmol/L 4.2 4.2 4.4  Chloride 97 - 108 mmol/L 100 96 96  CO2 18 - 29 mmol/L 28 32 31  Calcium 8.6 - 10.2 mg/dL 9.3 9.5 9.8    Hepatic Function Latest Ref Rng 09/28/2014 09/16/2012  Total Protein 6.0 - 8.5 g/dL 6.3 6.4  Albumin 3.5 - 5.2 g/dL - 4.3  AST 0 - 40 IU/L 19 18  ALT 0 - 44 IU/L 10 13  Alk Phosphatase 39 - 117 IU/L 55 46  Total Bilirubin 0.0 - 1.2 mg/dL 0.5 0.6  Bilirubin, Direct 0.0 - 0.3 mg/dL - 0.1    CBC Latest Ref Rng 09/28/2014 11/19/2011 02/06/2011  WBC 3.4 - 10.8 x10E3/uL 5.8 - 6.5  Hemoglobin 12.6 - 17.7 g/dL 12.1(L) 13.9 11.8(L)  Hematocrit 37.5 - 51.0 % 37.7 41.0 35.5(L)  Platelets 150 - 379 x10E3/uL 164 - 110(L)   Lab Results  Component Value Date   TSH 3.170  09/28/2014    Lipid Panel     Component Value Date/Time   CHOL 135 09/28/2014 0933   TRIG 77 09/28/2014 0933   HDL 48 09/28/2014 0933   LDLCALC 72 09/28/2014 0933      RADIOLOGY: No results found.    ASSESSMENT AND PLAN:   Melvin [name] is an 81 year old white male who is 18 years status post CABG surgery.  I had seen him one month ago which time he admitted to experiencing several episodes of chest pain which she had not experienced in a long time.  A prior nuclear perfusion study in March 2013 revealed fairly normal perfusion with the exception of a mild anteroseptal defect most likely related to his left bundle branch block.  His most recent nuclear study is different and now demonstrates new lateral wall ischemia.  He has been demonstrated to have mild-to-moderate aortic stenosis with reduced LV function with an ejection fraction of 35-40% which has remained stable over the past several years.  He has stable but chronic claudication symptoms and has documented one vessel runoff via the peroneal artery of his right lower extremity.  He has adequate wound healing.  He had a CardioNet monitor last fall, which revealed occasional isolated PVCs but he had an atrial run of approximately 11 beats.  He is unaware of any recent ectopy.  I reviewed his recent laboratory.  His lipid status is excellent on his current medical regimen.  I reviewed his most recent nuclear study with him in detail.  Is very possible that he has developed graft stenosis versus disease beyond the graft anastomosis.  He also has aortic stenosis.  I have recommended definitive cardiac catheterization, which will be a right and left heart cardiac catheterization for definitive evaluation.  I initially recommended that this be done next week, but he is concerned that his wife has commitments that he may not be able to have this  done at that time.  We will schedule this as soon as possible. The risks and benefits of a cardiac  catheterization including, but not limited to, death, stroke, MI, kidney damage and bleeding were discussed with the patient who indicates understanding and agrees to proceed.    Time spent: 25 minutes   Troy Sine, MD, [facility]  10/16/2014 10:05 AM

## 2014-10-16 ENCOUNTER — Encounter: Payer: Self-pay | Admitting: Cardiovascular Disease

## 2014-10-19 ENCOUNTER — Telehealth (HOSPITAL_COMMUNITY): Payer: Self-pay | Admitting: *Deleted

## 2014-10-26 ENCOUNTER — Ambulatory Visit (INDEPENDENT_AMBULATORY_CARE_PROVIDER_SITE_OTHER): Payer: Medicare Other | Admitting: Neurology

## 2014-10-26 ENCOUNTER — Encounter: Payer: Self-pay | Admitting: Neurology

## 2014-10-26 ENCOUNTER — Ambulatory Visit
Admission: RE | Admit: 2014-10-26 | Discharge: 2014-10-26 | Disposition: A | Discharge status: Home / Self Care | Payer: Medicare Other | Source: Ambulatory Visit | Attending: Cardiovascular Disease | Admitting: Cardiovascular Disease

## 2014-10-26 VITALS — BP 155/74 | HR 54 | Resp 16 | Ht 67.0 in | Wt 178.0 lb

## 2014-10-26 DIAGNOSIS — R0602 Shortness of breath: Secondary | ICD-10-CM

## 2014-10-26 DIAGNOSIS — I25810 Atherosclerosis of coronary artery bypass graft(s) without angina pectoris: Secondary | ICD-10-CM

## 2014-10-26 DIAGNOSIS — I209 Angina pectoris, unspecified: Secondary | ICD-10-CM | POA: Diagnosis not present

## 2014-10-26 DIAGNOSIS — I739 Peripheral vascular disease, unspecified: Secondary | ICD-10-CM

## 2014-10-26 DIAGNOSIS — Z01812 Encounter for preprocedural laboratory examination: Secondary | ICD-10-CM

## 2014-10-26 DIAGNOSIS — R609 Edema, unspecified: Secondary | ICD-10-CM

## 2014-10-26 DIAGNOSIS — R600 Localized edema: Secondary | ICD-10-CM

## 2014-10-26 DIAGNOSIS — G2581 Restless legs syndrome: Secondary | ICD-10-CM | POA: Diagnosis not present

## 2014-10-26 MED ORDER — GABAPENTIN 300 MG PO CAPS: 300.0000 mg | ORAL_CAPSULE | Freq: Every day | ORAL | Status: DC

## 2014-10-26 NOTE — Patient Instructions (Signed)
We will continue with gabapentin at 300 mg each night.

## 2014-10-26 NOTE — Progress Notes (Signed)
Subjective:    Patient ID: Melvin [name] is a 81 y.o. male.  HPI     Interim history:   Melvin [name] is a very pleasant 81 year old right-handed gentleman with an underlying complex medical history of type 2 diabetes, osteoporosis, lower extremity edema, left bundle branch block, heart palpitations, aortic stenosis, hypertension, and hyperlipidemia, CAD, s/p CABG in 10/98, who presents for followup consultation of his left leg pain, possible atypical RLS in conjunction with neuropathy. He is accompanied by his wife today. I last saw him on 03/15/2014, at which time he reported feeling fairly stable. However, he was taking the Horizant every other day due to cost issues. The Horizant had also helped his neck pain he felt. He had had some improvement in his symptoms with dopamine agonists including Requip and Mirapex but could not tolerate them longer-term. I felt his neuropathy was a little worse. I suggested we try generic gabapentin 300 mg qHS in lieu of the Horizant.   Today, 10/26/2014: He is able to provide his own history. His wife provides some details. He reports that he has had chest pains radiating on the L and he has had SOB at rest. This has been going on for a couple of months. He had an echocardiogram on 05/10/14, which I reviewed: Normal LV size with mild LV hypertrophy. EF 35-40% with diffuse hypokinesis, septal-lateral dyssynchrony. Mild aortic stenosis, mild aortic insufficiency. Normal RV size with mildly decreased systolic function. He had b/l LE arterial doppler on 04/20/14, which I reviewed as well. He has PVD. He has LE swelling and is on a diuretic. As far as the leg pains, he feels fair, gabapentin helps, but is not as good as the horizant, but at least affordable.  He is scheduled for a LHC next week. He will get a CXR today.  Previously:   I saw him on 09/09/2013, at which time he reported feeling fairly stable, taking Horizant qod, to help it last longer d/t cost. He  was able to drive to the appointment without having to stop to walk around. He had an echo in 12/14, which showed EF of 35 to 40%. He was advised to continue with the current management. He was recently seen by his cardiologist on 01/24/2014, and I reviewed the notes by Dr. Claiborne Billings. He had his most recent echocardiogram in May 2015 which showed an EF of 35-40%, similar to the one in December 2014. He has a history of moderate aortic stenosis. His lipid profile has been fine.  I saw him on 03/09/2013, at which time I felt his leg pain was do to a combination of peripheral neuropathy, peripheral vascular disease, chronic edema, and perhaps atypical RLS. He was unfortunately not able to tolerate Requip or Mirapex but had improvement in his pain with both dopamine agonists. Horizant at 300 mg has been somewhat helpful but he had mild morning grogginess.   I last saw him on 12/03/12, at which time he reported side effects from Mirapex even though it had actually helped his left leg pain, he had to stop it. On 09/01/2012 he reported side effects with a Requip, which made him groggy and he had to stop it. It had helped his leg pain, though.   I first met him on 07/21/2012 at which time a suggested blood work and a trial of Requip for the possibility of atypical RLS affecting only his left leg. He also has lower back pain and radiating pain. MRI lumbar spine in November 2012  did not show any structural lesions. He has had kidney cysts. 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 He has tried Neurontin. He has undergone epidural injections in 2013. At our last visit in 5/14 I suggested a trial of Horizant. He reported improvement in his night time pain and better sleep at night, but some daytime somnolence. He also worried about the cost of the medicine of over $120 per month. He had noted no other side effects.    His Past Medical History Is Significant For: Past Medical History  Diagnosis Date  . High cholesterol   . Unspecified hereditary and idiopathic peripheral neuropathy 10/01/2012  . RLS (restless legs syndrome) 10/01/2012  . Hypertension   . PAD (peripheral artery disease)   . PVC (premature ventricular contraction) 04/26/1997    on beta blocker/pt had cardio net monitor 06/23/2012  . S/P CABG x 5 1998  . LBBB (left bundle branch block)     His Past Surgical History Is Significant For: Past Surgical History  Procedure Laterality Date  . Coronary artery bypass graft      04/1997  . Hiatal hernia repair    . Appendectomy    . Doppler echocardiography  05/18/2012    EF 25-95%, LV systolic function mildly reduced; borderline LA enlargment; mild mitral annular calcif, mild-mod MR; mild TR with normal RVSP; mild calcification of AV leaflets and mild-mod valvular AS with mild regurg      . Doppler echocardiography  08/19/2011    EF 50-55%, mod conc LVH; mild mitral annular calcif with mild MR; mild-mod TR; AV mildly sclerotic with mild valvular AS and mild regurg, mild aortic root dilatation  . Nm myocar perf ejection fraction  09/2011    lexiscan - no reversible ischmai, fixed anteroseptal defect r/t LBBB; EF 51%; septal hypokinesis; low risk but abnormal  . Cardiac catheterization  04/26/1997    CAD - 50-60% prox to mid LAD with 70% stenosis in mid-distal, 70-80% 1st diag stenosis, 50-60% ostial stenosis in large 2nd diagonal; diffuse RCA stenosis with 40-50% osital narrowing with 70% prox stenosis (Dr. Corky Downs) >> CABG  . Coronary artery bypass graft  04/1997    CABGx5 - LIMA to LAD, SVG to PDA, SVG to PLA, VG to OM1, VG to 2nd diagonal (Dr. Roxan Hockey)  . Lower extremity arterial doppler  02/2012    RLE - mild to mod arterial insuff; RSFA 50-69% diameter reduction; R pop 50-69% diameter reduction; posterior tibial (R) demonstrates occlusive disease; LSFA/prox pop 0-49% diameter reduction;  L distal pop 50-69% diameter reduction; posterior tibial (L) demonstrates occlusive disease  . Lower extremity angiogram  02/05/2011    Diamondback orbital rotational atherectomy, percutaneous transluminal angioplasty of high-grade calcified popliteal & tibioperoneal stenosis (Dr. Adora Fridge)    His Family History Is Significant For: Family History  Problem Relation Age of Onset  . CAD Mother   . Heart attack Father     His Social History Is Significant For: History   Social History  . Marital Status: Married    Spouse Name: Melvin [name]  . Number of Children: 1  . Years of Education: College   Occupational History  .     Social History Main Topics  . Smoking status: Never Smoker   . Smokeless tobacco: Never Used  . Alcohol Use: No  . Drug Use: No  . Sexual Activity: Not on file   Other Topics  Concern  . None   Social History Narrative   Pt lives at home with his spouse.   Has been married 64yrs.   Caffeine TFT:[id number], is right handed    His Allergies Are:  Allergies  Allergen Reactions  . Other     Patient is allergic to something but doesn't know the name.  . Pramipexole   :   His Current Medications Are:  Outpatient Encounter Prescriptions as of 10/26/2014  Medication Sig  . amLODipine-atorvastatin (CADUET) 10-40 MG per tablet TAKE 1 TABLET DAILY (DISCONTINUE AMLODIPINE/BENAZEPRIL)  . amoxicillin (AMOXIL) 500 MG capsule FOR DENTAL USE ONLY  . aspirin 81 MG tablet Take 81 mg by mouth daily.  Marland Kitchen atenolol (TENORMIN) 100 MG tablet Take 1 tablet (100 mg total) by mouth 2 (two) times daily.  Marland Kitchen atenolol (TENORMIN) 25 MG tablet Take 1 tablet (25 mg total) by mouth every morning.  . Calcium-Vitamin D (CALTRATE 600 PLUS-VIT D PO) Take 1 tablet by mouth daily.   . clopidogrel (PLAVIX) 75 MG tablet TAKE 1 TABLET DAILY  . gabapentin (NEURONTIN) 300 MG capsule TAKE 1 CAPSULE AT BEDTIME  . losartan (COZAAR) 50 MG tablet Take 1 tablet (50 mg total) by mouth 2 (two) times daily.  .  metformin (FORTAMET) 1000 MG (OSM) 24 hr tablet Take 1,000 mg by mouth 2 (two) times daily with a meal.  . Multiple Vitamins-Minerals (PRESERVISION AREDS 2 PO) Take 1 capsule by mouth 2 (two) times daily.   . multivitamin-iron-minerals-folic acid (CENTRUM) chewable tablet Chew 1 tablet by mouth daily.  . niacin (NIASPAN) 750 MG CR tablet Take 2 tablets (1,500 mg total) by mouth at bedtime.  Marland Kitchen NITROSTAT 0.4 MG SL tablet   . polycarbophil (FIBERCON) 625 MG tablet Take 625 mg by mouth daily.  Marland Kitchen torsemide (DEMADEX) 20 MG tablet Take 1 tablet (20 mg total) by mouth 2 (two) times daily.  . zoledronic acid (RECLAST) 5 MG/100ML SOLN Inject 100 mLs (5 mg total) into the vein once.  :  Review of Systems:  Out of a complete 14 point review of systems, all are reviewed and negative with the exception of these symptoms as listed below:   Review of Systems  All other systems reviewed and are negative.   Objective:  Neurologic Exam  Physical Exam Physical Examination:   Filed Vitals:   10/26/14 1153  BP: 155/74  Pulse: 54  Resp: 16    General Examination: The patient is a very pleasant 81 y.o. male in no acute distress.   HEENT exam: Normocephalic, atraumatic, face is symmetric with normal facial animation noted. Neck is supple with full range of motion. Hearing is grossly intact. Pupils are equal, round and reactive to light and accommodation. Funduscopy is difficult b/l. Extraocular tracking is good without nystagmus. Speech is clear. Oropharynx is clear. He has moderate mouth dryness. He has normal airway findings. Tongue protrudes centrally and palate elevates symmetrically. There is no lip, neck or jaw tremor.   Chest is clear to auscultation, with no wheezing, crackles or rhonchi noted.  Heart sounds are normal with the exception of a 5/4-2/7 systolic murmur audible, unchanged.  Abdomen soft, nontender with positive bowel sounds.  Skin examination reveals skin to be warm and dry. There are  hyperpigmentations distal lower extremities and scars from prior vein harvesting.  he also has sun exposure related changes on his scalp. He has hyperkeratosis on his nose.  Extremities: He has 1 to 2+ pitting edema in the distal lower extremities up  to upper shin areas bilaterally, slightly worse.  Neurologically: Mental status: The patient is awake, alert and oriented in all 3 spheres. His memory, attention, language and knowledge are appropriate. Cranial nerves are as described under HEENT exam. Motor-wise he has normal bulk strength and tone throughout with the exception of 4+ out of 5 weakness in his left hip flexor, unchanged. Reflexes are 1+ in the upper extremities trace in both knees and absent in both ankles, unchanged. Sensory exam reveals decreased sensation to vibration, pinprick and temperature sense in both distal lower extremities left worse than right, b/l up to mid shin areas. He has normal sensation in his hands and face. He has no drift and no tremor. Romberg testing reveals mild swaying but no corrective steps. His gait, station and balance are unremarkable for age. He does walk cautiously and turns slowly, he has no walking aid.    Assessment and Plan:   In summary, Mr. Dangerfield is a 81 yo gentleman with an approximately 3 1/2 year history of L leg pains, most likely due to multiple issues: peripheral neuropathy, PVD, chronic leg edema and RLS. He was not able to tolerate Requip or Mirapex, and while he had improvement in his pain with both DAs, he had some SEs and while Horizant was helpful, he could not afford it and took it qod for a while. I changed this to generic gabapentin 300 mg each night in lieu of Horizant, which is somewhat helpful. He has had issues in the last few months with CP and SOB and is due for a LHC next week. I suggested we continue with the gabapentin at the current dose of 300 mg each night. Thankfully he has been able to tolerate it and it is somewhat helpful. We  will see how things evolve. He is advised to use his cane for safety if needed. He is stable neurologically. His lower extremity edema seems a little worse. We reviewed his recent test results again including his echocardiogram and arterial Doppler studies. He is follows closely with his specialists.  I would like to see him back in 6 months from now, sooner if the need arises and asked him to call us with any interim questions, concerns, problems, updates or refill requests. He and his wife were in agreement. I spent 25 minutes in total face-to-face time with the patient, more than 50% of which was spent in counseling and coordination of care, reviewing test results, reviewing medication and discussing or reviewing the diagnosis of leg pain, RLS, the prognosis and treatment options.

## 2014-10-27 LAB — COMPREHENSIVE METABOLIC PANEL
ALT: 16 U/L (ref 0–53)
AST: 21 U/L (ref 0–37)
Albumin: 4.6 g/dL (ref 3.5–5.2)
Alkaline Phosphatase: 55 U/L (ref 39–117)
BUN: 22 mg/dL (ref 6–23)
CALCIUM: 10.1 mg/dL (ref 8.4–10.5)
CO2: 30 mEq/L (ref 19–32)
Chloride: 96 mEq/L (ref 96–112)
Creat: 0.83 mg/dL (ref 0.50–1.35)
Glucose, Bld: 111 mg/dL — ABNORMAL HIGH (ref 70–99)
POTASSIUM: 4.1 meq/L (ref 3.5–5.3)
Sodium: 137 mEq/L (ref 135–145)
Total Bilirubin: 0.6 mg/dL (ref 0.2–1.2)
Total Protein: 7 g/dL (ref 6.0–8.3)

## 2014-10-27 LAB — CBC
HCT: 40.2 % (ref 39.0–52.0)
HEMOGLOBIN: 13 g/dL (ref 13.0–17.0)
MCH: 31.1 pg (ref 26.0–34.0)
MCHC: 32.3 g/dL (ref 30.0–36.0)
MCV: 96.2 fL (ref 78.0–100.0)
MPV: 9.7 fL (ref 8.6–12.4)
PLATELETS: 175 10*3/uL (ref 150–400)
RBC: 4.18 MIL/uL — ABNORMAL LOW (ref 4.22–5.81)
RDW: 13.5 % (ref 11.5–15.5)
WBC: 6.2 10*3/uL (ref 4.0–10.5)

## 2014-10-27 LAB — PROTIME-INR
INR: 1.04 (ref <1.50)
Prothrombin Time: 13.6 seconds (ref 11.6–15.2)

## 2014-10-27 LAB — APTT: APTT: 30 s (ref 24–37)

## 2014-11-01 ENCOUNTER — Encounter (HOSPITAL_COMMUNITY): Payer: Self-pay | Admitting: General Practice

## 2014-11-01 ENCOUNTER — Inpatient Hospital Stay (HOSPITAL_COMMUNITY)
Admission: RE | Admit: 2014-11-01 | Discharge: 2014-11-03 | DRG: 247 | Disposition: A | Discharge status: Home / Self Care | Payer: Medicare Other | Source: Ambulatory Visit | Attending: Cardiovascular Disease | Admitting: Cardiovascular Disease

## 2014-11-01 ENCOUNTER — Encounter (HOSPITAL_COMMUNITY)
Admission: RE | Disposition: A | Discharge status: Home / Self Care | Payer: Self-pay | Source: Ambulatory Visit | Attending: Cardiovascular Disease

## 2014-11-01 DIAGNOSIS — E780 Pure hypercholesterolemia: Secondary | ICD-10-CM | POA: Diagnosis present

## 2014-11-01 DIAGNOSIS — I454 Nonspecific intraventricular block: Secondary | ICD-10-CM | POA: Diagnosis present

## 2014-11-01 DIAGNOSIS — I25110 Atherosclerotic heart disease of native coronary artery with unstable angina pectoris: Secondary | ICD-10-CM | POA: Diagnosis not present

## 2014-11-01 DIAGNOSIS — Z7983 Long term (current) use of bisphosphonates: Secondary | ICD-10-CM | POA: Diagnosis not present

## 2014-11-01 DIAGNOSIS — I082 Rheumatic disorders of both aortic and tricuspid valves: Secondary | ICD-10-CM | POA: Diagnosis present

## 2014-11-01 DIAGNOSIS — I2582 Chronic total occlusion of coronary artery: Secondary | ICD-10-CM | POA: Diagnosis not present

## 2014-11-01 DIAGNOSIS — E119 Type 2 diabetes mellitus without complications: Secondary | ICD-10-CM | POA: Diagnosis not present

## 2014-11-01 DIAGNOSIS — I10 Essential (primary) hypertension: Secondary | ICD-10-CM | POA: Diagnosis present

## 2014-11-01 DIAGNOSIS — I351 Nonrheumatic aortic (valve) insufficiency: Secondary | ICD-10-CM

## 2014-11-01 DIAGNOSIS — I447 Left bundle-branch block, unspecified: Secondary | ICD-10-CM | POA: Diagnosis present

## 2014-11-01 DIAGNOSIS — Z7982 Long term (current) use of aspirin: Secondary | ICD-10-CM

## 2014-11-01 DIAGNOSIS — Z955 Presence of coronary angioplasty implant and graft: Secondary | ICD-10-CM

## 2014-11-01 DIAGNOSIS — I2510 Atherosclerotic heart disease of native coronary artery without angina pectoris: Secondary | ICD-10-CM | POA: Diagnosis present

## 2014-11-01 DIAGNOSIS — Z7902 Long term (current) use of antithrombotics/antiplatelets: Secondary | ICD-10-CM | POA: Diagnosis not present

## 2014-11-01 DIAGNOSIS — I25708 Atherosclerosis of coronary artery bypass graft(s), unspecified, with other forms of angina pectoris: Secondary | ICD-10-CM | POA: Diagnosis not present

## 2014-11-01 DIAGNOSIS — I25710 Atherosclerosis of autologous vein coronary artery bypass graft(s) with unstable angina pectoris: Principal | ICD-10-CM | POA: Diagnosis present

## 2014-11-01 DIAGNOSIS — G2581 Restless legs syndrome: Secondary | ICD-10-CM | POA: Diagnosis present

## 2014-11-01 DIAGNOSIS — I739 Peripheral vascular disease, unspecified: Secondary | ICD-10-CM | POA: Diagnosis present

## 2014-11-01 DIAGNOSIS — Z9861 Coronary angioplasty status: Secondary | ICD-10-CM | POA: Diagnosis not present

## 2014-11-01 DIAGNOSIS — G609 Hereditary and idiopathic neuropathy, unspecified: Secondary | ICD-10-CM | POA: Diagnosis present

## 2014-11-01 DIAGNOSIS — Z888 Allergy status to other drugs, medicaments and biological substances status: Secondary | ICD-10-CM

## 2014-11-01 DIAGNOSIS — Z01818 Encounter for other preprocedural examination: Secondary | ICD-10-CM

## 2014-11-01 DIAGNOSIS — I200 Unstable angina: Secondary | ICD-10-CM | POA: Diagnosis not present

## 2014-11-01 HISTORY — DX: Nonexudative age-related macular degeneration, bilateral, stage unspecified: H35.3130

## 2014-11-01 HISTORY — PX: LEFT AND RIGHT HEART CATHETERIZATION WITH CORONARY ANGIOGRAM: SHX5449

## 2014-11-01 HISTORY — DX: Type 2 diabetes mellitus without complications: E11.9

## 2014-11-01 HISTORY — DX: Atherosclerotic heart disease of native coronary artery without angina pectoris: I25.10

## 2014-11-01 HISTORY — DX: Personal history of other diseases of the digestive system: Z87.19

## 2014-11-01 HISTORY — DX: Unspecified osteoarthritis, unspecified site: M19.90

## 2014-11-01 HISTORY — DX: Age-related osteoporosis without current pathological fracture: M81.0

## 2014-11-01 HISTORY — PX: CARDIAC CATHETERIZATION: SHX172

## 2014-11-01 HISTORY — DX: Gastro-esophageal reflux disease without esophagitis: K21.9

## 2014-11-01 LAB — POCT I-STAT 3, VENOUS BLOOD GAS (G3P V)
Acid-Base Excess: 2 mmol/L (ref 0.0–2.0)
Bicarbonate: 28.8 mEq/L — ABNORMAL HIGH (ref 20.0–24.0)
O2 SAT: 75 %
PCO2 VEN: 52.2 mmHg — AB (ref 45.0–50.0)
PH VEN: 7.35 — AB (ref 7.250–7.300)
TCO2: 30 mmol/L (ref 0–100)
pO2, Ven: 43 mmHg (ref 30.0–45.0)

## 2014-11-01 LAB — POCT I-STAT 3, ART BLOOD GAS (G3+)
Acid-Base Excess: 2 mmol/L (ref 0.0–2.0)
Bicarbonate: 28.1 mEq/L — ABNORMAL HIGH (ref 20.0–24.0)
O2 SAT: 98 %
PH ART: 7.367 (ref 7.350–7.450)
PO2 ART: 117 mmHg — AB (ref 80.0–100.0)
TCO2: 30 mmol/L (ref 0–100)
pCO2 arterial: 49 mmHg — ABNORMAL HIGH (ref 35.0–45.0)

## 2014-11-01 LAB — GLUCOSE, CAPILLARY
GLUCOSE-CAPILLARY: 130 mg/dL — AB (ref 70–99)
Glucose-Capillary: 126 mg/dL — ABNORMAL HIGH (ref 70–99)
Glucose-Capillary: 119 mg/dL — ABNORMAL HIGH (ref 70–99)
Glucose-Capillary: 278 mg/dL — ABNORMAL HIGH (ref 70–99)

## 2014-11-01 SURGERY — LEFT AND RIGHT HEART CATHETERIZATION WITH CORONARY ANGIOGRAM
Anesthesia: LOCAL

## 2014-11-01 MED ORDER — ASPIRIN EC 81 MG PO TBEC
81.0000 mg | DELAYED_RELEASE_TABLET | Freq: Every day | ORAL | Status: DC
  Administered 2014-11-01 – 2014-11-02 (×2): 81 mg via ORAL
  Filled 2014-11-01 (×3): qty 1

## 2014-11-01 MED ORDER — CALCIUM POLYCARBOPHIL 625 MG PO TABS
625.0000 mg | ORAL_TABLET | Freq: Every day | ORAL | Status: DC
  Administered 2014-11-02 – 2014-11-03 (×2): 625 mg via ORAL
  Filled 2014-11-01 (×3): qty 1

## 2014-11-01 MED ORDER — SODIUM CHLORIDE 0.9 % IV SOLN: INTRAVENOUS | Status: DC

## 2014-11-01 MED ORDER — SODIUM CHLORIDE 0.9 % IJ SOLN: 3.0000 mL | INTRAMUSCULAR | Status: DC | PRN

## 2014-11-01 MED ORDER — ATORVASTATIN CALCIUM 40 MG PO TABS
40.0000 mg | ORAL_TABLET | Freq: Every day | ORAL | Status: DC
  Administered 2014-11-01 – 2014-11-02 (×2): 40 mg via ORAL
  Filled 2014-11-01 (×3): qty 1

## 2014-11-01 MED ORDER — AMLODIPINE BESYLATE 10 MG PO TABS
10.0000 mg | ORAL_TABLET | Freq: Every day | ORAL | Status: DC
  Administered 2014-11-02 – 2014-11-03 (×2): 10 mg via ORAL
  Filled 2014-11-01 (×4): qty 1

## 2014-11-01 MED ORDER — ONDANSETRON HCL 4 MG/2ML IJ SOLN: 4.0000 mg | Freq: Four times a day (QID) | INTRAMUSCULAR | Status: DC | PRN

## 2014-11-01 MED ORDER — DIAZEPAM 5 MG PO TABS
ORAL_TABLET | ORAL | Status: AC
  Administered 2014-11-01: 5 mg
  Filled 2014-11-01: qty 1

## 2014-11-01 MED ORDER — HEPARIN (PORCINE) IN NACL 100-0.45 UNIT/ML-% IJ SOLN
1200.0000 [IU]/h | INTRAMUSCULAR | Status: DC
Start: 2014-11-01 — End: 2014-11-02
  Administered 2014-11-01: 21:00:00 1200 [IU]/h via INTRAVENOUS
  Filled 2014-11-01 (×2): qty 250

## 2014-11-01 MED ORDER — FENTANYL CITRATE (PF) 100 MCG/2ML IJ SOLN
INTRAMUSCULAR | Status: AC
  Filled 2014-11-01: qty 2

## 2014-11-01 MED ORDER — ASPIRIN 81 MG PO CHEW
CHEWABLE_TABLET | ORAL | Status: AC
  Administered 2014-11-01: 81 mg
  Filled 2014-11-01: qty 1

## 2014-11-01 MED ORDER — ACETAMINOPHEN 325 MG PO TABS
650.0000 mg | ORAL_TABLET | ORAL | Status: DC | PRN
  Administered 2014-11-03: 650 mg via ORAL
  Filled 2014-11-01: qty 2

## 2014-11-01 MED ORDER — INSULIN ASPART 100 UNIT/ML ~~LOC~~ SOLN
0.0000 [IU] | Freq: Three times a day (TID) | SUBCUTANEOUS | Status: DC
  Administered 2014-11-02 – 2014-11-03 (×4): 2 [IU] via SUBCUTANEOUS

## 2014-11-01 MED ORDER — LIDOCAINE HCL (PF) 1 % IJ SOLN
INTRAMUSCULAR | Status: AC
  Filled 2014-11-01: qty 30

## 2014-11-01 MED ORDER — SODIUM CHLORIDE 0.9 % IV SOLN: 250.0000 mL | INTRAVENOUS | Status: DC | PRN

## 2014-11-01 MED ORDER — SODIUM CHLORIDE 0.9 % IJ SOLN: 3.0000 mL | Freq: Two times a day (BID) | INTRAMUSCULAR | Status: DC

## 2014-11-01 MED ORDER — MIDAZOLAM HCL 2 MG/2ML IJ SOLN
INTRAMUSCULAR | Status: AC
  Filled 2014-11-01: qty 2

## 2014-11-01 MED ORDER — ATENOLOL 100 MG PO TABS
100.0000 mg | ORAL_TABLET | Freq: Two times a day (BID) | ORAL | Status: DC
  Administered 2014-11-01 – 2014-11-03 (×3): 100 mg via ORAL
  Filled 2014-11-01 (×5): qty 1

## 2014-11-01 MED ORDER — GABAPENTIN 300 MG PO CAPS
300.0000 mg | ORAL_CAPSULE | Freq: Every day | ORAL | Status: DC
  Administered 2014-11-01 – 2014-11-02 (×2): 300 mg via ORAL
  Filled 2014-11-01 (×3): qty 1

## 2014-11-01 MED ORDER — OXYMETAZOLINE HCL 0.05 % NA SOLN
2.0000 | Freq: Every day | NASAL | Status: DC
  Filled 2014-11-01: qty 15

## 2014-11-01 MED ORDER — NITROGLYCERIN 0.4 MG SL SUBL: 0.4000 mg | SUBLINGUAL_TABLET | SUBLINGUAL | Status: DC | PRN

## 2014-11-01 MED ORDER — OXYMETAZOLINE HCL 0.05 % NA SOLN: 2.0000 | Freq: Three times a day (TID) | NASAL | Status: DC | PRN

## 2014-11-01 MED ORDER — ATENOLOL 25 MG PO TABS
25.0000 mg | ORAL_TABLET | Freq: Every day | ORAL | Status: DC
  Administered 2014-11-02 – 2014-11-03 (×2): 25 mg via ORAL
  Filled 2014-11-01 (×3): qty 1

## 2014-11-01 MED ORDER — CLOPIDOGREL BISULFATE 75 MG PO TABS
75.0000 mg | ORAL_TABLET | Freq: Every day | ORAL | Status: DC
  Administered 2014-11-01 – 2014-11-03 (×3): 75 mg via ORAL
  Filled 2014-11-01 (×3): qty 1

## 2014-11-01 MED ORDER — HEPARIN (PORCINE) IN NACL 2-0.9 UNIT/ML-% IJ SOLN
INTRAMUSCULAR | Status: AC
  Filled 2014-11-01: qty 1000

## 2014-11-01 MED ORDER — TICAGRELOR 90 MG PO TABS: 180.0000 mg | ORAL_TABLET | Freq: Once | ORAL | Status: DC

## 2014-11-01 MED ORDER — AMOXICILLIN 500 MG PO CAPS: 2000.0000 mg | ORAL_CAPSULE | ORAL | Status: DC

## 2014-11-01 MED ORDER — NITROGLYCERIN 1 MG/10 ML FOR IR/CATH LAB
INTRA_ARTERIAL | Status: AC
  Filled 2014-11-01: qty 10

## 2014-11-01 MED ORDER — NIACIN ER (ANTIHYPERLIPIDEMIC) 500 MG PO TBCR
1500.0000 mg | EXTENDED_RELEASE_TABLET | Freq: Every day | ORAL | Status: DC
  Administered 2014-11-01 – 2014-11-02 (×2): 1500 mg via ORAL
  Filled 2014-11-01 (×3): qty 3

## 2014-11-01 MED ORDER — TORSEMIDE 20 MG PO TABS
20.0000 mg | ORAL_TABLET | Freq: Two times a day (BID) | ORAL | Status: DC
  Administered 2014-11-01 – 2014-11-02 (×2): 20 mg via ORAL
  Filled 2014-11-01 (×6): qty 1

## 2014-11-01 MED ORDER — SODIUM CHLORIDE 0.9 % IV SOLN
INTRAVENOUS | Status: DC
  Administered 2014-11-01: 10:00:00 via INTRAVENOUS

## 2014-11-01 MED ORDER — DIAZEPAM 5 MG PO TABS: 5.0000 mg | ORAL_TABLET | ORAL | Status: DC

## 2014-11-01 MED ORDER — ANGIOPLASTY BOOK
Freq: Once | Status: AC
  Administered 2014-11-01: 21:00:00
  Filled 2014-11-01: qty 1

## 2014-11-01 NOTE — H&P (View-Only) (Signed)
Patient ID: Melvin [name], male   DOB: 12-23-1927, 81 y.o.   MRN: [id number]    HPI: Melvin [name] is a 81 y.o. male who presents for a one month cardiology followup evaluation.  Melvin [name] has a history of ventricular ectopy and has been on beta blocker therapy since the late 1980s with ectopy suppression. He has CAD and underwent PTCA of his LAD  in 1991. In October 1998 he underwent CABG surgery x5 by Dr. Roxan Hockey (LIMA to LAD, sequential vein to the PDA and PLA, vein to the OM 1, vein to the second diagonal vessel). He has chronic left bundle branch block, and also has been followed for aortic valve stenosis with aortic insufficiency which previously has been in the mild to moderate range. He has peripheral vascular disease and underwent rotational atherectomy of his left popliteal and tibial trunk and angioplasty of focal popliteal lesions. In November 2013 an echo showed an ejection fraction of 50-09% with diastolic dysfunction grade 1. He was felt to have mild to moderate aortic stenosis with mean gradient of 14, maximum gradient of 24, and valve area calculated at 1.2 cm with mild aortic insufficiency.  In December 2013  a CardioNet monitor showed frequent ventricular ectopy without any episodes of ventricular tachycardia but he did have multifocal ectopic complexes. He had been under significant stress in caring for his wife who has has significant health issues. On 06/10/2013 a followup echo Doppler study  showed an ejection fraction of 35-40%. He had abnormal septal motion consistent with dyssynergy due to his bundle branch block. The report was interpreted as mild aortic stenosis and he had a mean gradient of 17 and peak gradient of 29 with a valve area of 1.5 cm. He did have mild to moderate aortic insufficiency. There is mild-to-moderate tricuspid regurgitation left atrium was significantly dilated and he had mild/moderate tricuspid regurgitation.  A follow-up  echo on 11/15/2013 showed an ejection fraction of approximately 35- 40% which was unchanged from 06/2013.  There was mild LVH with mild focal basal hypertrophy of the septum.  Systolic function was moderately reduced.  He was felt to have moderate aortic stenosis with mild AR.  Mean gradient was 7 mm, and peak transvalvular aortic gradient was 31 mm.  However, because of his moderate LV dysfunction, aortic valve area was 1.14 cm, compatible with moderate gas.  There is also mild mitral regurgitation.  He ventricular dyssynergy due to his chronic left bundle branch block.  Estimated PA pressure was 37 mm.   A six-month follow-up echo Doppler study on 05/10/2014 continued to show an ejection fraction of 35-40% with diffuse hypokinesis and septal lateral dyssynchrony consistent with his left bundle branch block.  On this echo he was felt to have only mild aortic stenosis with a mean gradient of 15 mm and a valve area of 1.6 cm.  His aorta had mild dilatation.  PA pressure was normal.  There was moderate left atrial dilatation.  He underwent a lower Doppler study on 04/20/2014 which  showed an ABI of 0.621 on the right.  He has one-vessel runoff of the peroneal artery and the posterior and anterior tibial arteries are known to be occluded.  This was not significantly changed from his prior study.  When I saw him one month ago he was complaining that he had experienced two  episode of chest pressure which radiated to his shoulders and arms.  He took nitroglycerin with relief.  He had another episode  of chest pain several days later.  I scheduled him for a nuclear perfusion study which was done on 10/05/2014.  This revealed an ejection fraction at 37%.  The study was interpreted as high risk with new lateral wall ischemia.  In addition to his moderate LV dysfunction.  In addition, there is a fixed septal defect likely related to his left bundle branch block.  He continues to have ankle edema.  He has been on  torsemide 20 mg twice a day but did not take his dose today.  He presents for evaluation.  Past Medical History  Diagnosis Date  . High cholesterol   . Unspecified hereditary and idiopathic peripheral neuropathy 10/01/2012  . RLS (restless legs syndrome) 10/01/2012  . Hypertension   . PAD (peripheral artery disease)   . PVC (premature ventricular contraction) 04/26/1997    on beta blocker/pt had cardio net monitor 06/23/2012  . S/P CABG x 5 1998  . LBBB (left bundle branch block)     Past Surgical History  Procedure Laterality Date  . Coronary artery bypass graft      04/1997  . Hiatal hernia repair    . Appendectomy    . Doppler echocardiography  05/18/2012    EF 33-35%, LV systolic function mildly reduced; borderline LA enlargment; mild mitral annular calcif, mild-mod MR; mild TR with normal RVSP; mild calcification of AV leaflets and mild-mod valvular AS with mild regurg      . Doppler echocardiography  08/19/2011    EF 50-55%, mod conc LVH; mild mitral annular calcif with mild MR; mild-mod TR; AV mildly sclerotic with mild valvular AS and mild regurg, mild aortic root dilatation  . Nm myocar perf ejection fraction  09/2011    lexiscan - no reversible ischmai, fixed anteroseptal defect r/t LBBB; EF 51%; septal hypokinesis; low risk but abnormal  . Cardiac catheterization  04/26/1997    CAD - 50-60% prox to mid LAD with 70% stenosis in mid-distal, 70-80% 1st diag stenosis, 50-60% ostial stenosis in large 2nd diagonal; diffuse RCA stenosis with 40-50% osital narrowing with 70% prox stenosis (Dr. Corky Downs) >> CABG  . Coronary artery bypass graft  04/1997    CABGx5 - LIMA to LAD, SVG to PDA, SVG to PLA, VG to OM1, VG to 2nd diagonal (Dr. Roxan Hockey)  . Lower extremity arterial doppler  02/2012    RLE - mild to mod arterial insuff; RSFA 50-69% diameter reduction; R pop 50-69% diameter reduction; posterior tibial (R) demonstrates occlusive disease; LSFA/prox pop 0-49% diameter reduction; L  distal pop 50-69% diameter reduction; posterior tibial (L) demonstrates occlusive disease  . Lower extremity angiogram  02/05/2011    Diamondback orbital rotational atherectomy, percutaneous transluminal angioplasty of high-grade calcified popliteal & tibioperoneal stenosis (Dr. Adora Fridge)    Allergies  Allergen Reactions  . Other     Patient is allergic to something but doesn't know the name.  . Pramipexole     Current Outpatient Prescriptions  Medication Sig Dispense Refill  . amLODipine-atorvastatin (CADUET) 10-40 MG per tablet TAKE 1 TABLET DAILY (DISCONTINUE AMLODIPINE/BENAZEPRIL) 90 tablet 2  . amoxicillin (AMOXIL) 500 MG capsule FOR DENTAL USE ONLY  3  . aspirin 81 MG tablet Take 81 mg by mouth daily.    Marland Kitchen atenolol (TENORMIN) 100 MG tablet Take 1 tablet (100 mg total) by mouth 2 (two) times daily. 180 tablet 3  . atenolol (TENORMIN) 25 MG tablet Take 1 tablet (25 mg total) by mouth every morning. 180 tablet 3  .  Calcium-Vitamin D (CALTRATE 600 PLUS-VIT D PO) Take 1 tablet by mouth daily.     . clopidogrel (PLAVIX) 75 MG tablet TAKE 1 TABLET DAILY 90 tablet 1  . gabapentin (NEURONTIN) 300 MG capsule TAKE 1 CAPSULE AT BEDTIME 90 capsule 0  . losartan (COZAAR) 50 MG tablet Take 1 tablet (50 mg total) by mouth 2 (two) times daily. 180 tablet 3  . metformin (FORTAMET) 1000 MG (OSM) 24 hr tablet Take 1,000 mg by mouth 2 (two) times daily with a meal.    . Multiple Vitamins-Minerals (PRESERVISION AREDS 2 PO) Take 1 capsule by mouth 2 (two) times daily.     . multivitamin-iron-minerals-folic acid (CENTRUM) chewable tablet Chew 1 tablet by mouth daily.    . niacin (NIASPAN) 750 MG CR tablet Take 2 tablets (1,500 mg total) by mouth at bedtime. 180 tablet 3  . NITROSTAT 0.4 MG SL tablet     . polycarbophil (FIBERCON) 625 MG tablet Take 625 mg by mouth daily.    Marland Kitchen torsemide (DEMADEX) 20 MG tablet Take 1 tablet (20 mg total) by mouth 2 (two) times daily. 180 tablet 3  . zoledronic acid  (RECLAST) 5 MG/100ML SOLN Inject 100 mLs (5 mg total) into the vein once. 100 mL 0   No current facility-administered medications for this visit.    Socially he is married, retired, and has one child.  He does not routinely exercise. There is no tobacco or alcohol use.  ROS General: Negative; No fevers, chills, or night sweats;  HEENT: Negative; No changes in vision or hearing, sinus congestion, difficulty swallowing Pulmonary: Negative; No cough, wheezing, shortness of breath, hemoptysis Cardiovascular: See HPI;  GI: Negative; No nausea, vomiting, diarrhea, or abdominal pain GU: Negative; No dysuria, hematuria, or difficulty voiding Musculoskeletal: Negative; no myalgias, joint pain, or weakness Hematologic/Oncology: Negative; no easy bruising, bleeding Endocrine: Negative; no heat/cold intolerance; no diabetes Neuro: Negative; no changes in balance, headaches Skin: Negative; No rashes or skin lesions Psychiatric: Negative; No behavioral problems, depression Sleep: Negative; No snoring, daytime sleepiness, hypersomnolence, bruxism, restless legs, hypnogognic hallucinations, no cataplexy Other comprehensive 14 point system review is negative.   PE BP 160/67 mmHg  Pulse 54  Ht 5' 7.25" (1.708 m)  Wt 177 lb 9.6 oz (80.559 kg)  BMI 27.61 kg/m2  General: Alert, oriented, no distress.  HEENT: Normocephalic, atraumatic. Pupils round and reactive; sclera anicteric;  Nose without nasal septal hypertrophy Mouth/Parynx benign; Mallinpatti scale  2 Neck: No JVD, no carotid bruits with transmitted murmur Chest: No tenderness to palpation Lungs: clear to ausculatation and percussion; no wheezing or rales Heart: RRR, s1 s2 normal 2/6 , 2/6 harsh systolic murmur in the aortic area and 1/ 6 diastolic murmur compatible with AS/AR. No rubs thrills or heaves. Abdomen: soft, nontender; no hepatosplenomehaly, BS+; abdominal aorta nontender and not dilated by palpation. Back: No CVA Pulses  2+ Extremities: 1-2+ ankle swelling but he did not take his diuretic this morning., Homan's sign negative  Neurologic: grossly nonfocal Psychologic: Normal affect and mood   March 2016 ECG (independently read by me): Sinus bradycardia 54 bpm.  Left bundle branch block with repolarization changes.  QTc interval 470 ms.  November 2015 ECG (independently read by me): Sinus bradycardia 55 bpm with left bundle branch block and repolarization changes  ECG (independently read by me): Sinus bradycardia 57 beats per minute.  Left bundle branch block with repolarization changes.  PR interval is now normal at 162 ms  Prior February 2015 ECG (independently read  by me): Sinus bradycardia 58 beats per minute. Left bundle branch block. Borderline first-degree block with a PR interval 208 ms the  Prior ECG of 06/22/2013:  Sinus rhythm at 63 beats per minute with resolution of prior first degree heart block. Left bundle branch block with repolarization changes.  LABS:  BMP Latest Ref Rng 09/28/2014 09/22/2013 09/16/2013  Glucose 65 - 99 mg/dL 140(H) 107(H) 84  BUN 8 - 27 mg/dL _0 Creatinine 0.76 - 1.27 mg/dL 0.85 0.76 0.83  BUN/Creat Ratio 10 - 22 20 - -  Sodium 134 - 144 mmol/L 140 137 137  Potassium 3.5 - 5.2 mmol/L 4.2 4.2 4.4  Chloride 97 - 108 mmol/L 100 96 96  CO2 18 - 29 mmol/L 28 32 31  Calcium 8.6 - 10.2 mg/dL 9.3 9.5 9.8    Hepatic Function Latest Ref Rng 09/28/2014 09/16/2012  Total Protein 6.0 - 8.5 g/dL 6.3 6.4  Albumin 3.5 - 5.2 g/dL - 4.3  AST 0 - 40 IU/L 19 18  ALT 0 - 44 IU/L 10 13  Alk Phosphatase 39 - 117 IU/L 55 46  Total Bilirubin 0.0 - 1.2 mg/dL 0.5 0.6  Bilirubin, Direct 0.0 - 0.3 mg/dL - 0.1    CBC Latest Ref Rng 09/28/2014 11/19/2011 02/06/2011  WBC 3.4 - 10.8 x10E3/uL 5.8 - 6.5  Hemoglobin 12.6 - 17.7 g/dL 12.1(L) 13.9 11.8(L)  Hematocrit 37.5 - 51.0 % 37.7 41.0 35.5(L)  Platelets 150 - 379 x10E3/uL 164 - 110(L)   Lab Results  Component Value Date   TSH 3.170  09/28/2014    Lipid Panel     Component Value Date/Time   CHOL 135 09/28/2014 0933   TRIG 77 09/28/2014 0933   HDL 48 09/28/2014 0933   LDLCALC 72 09/28/2014 0933      RADIOLOGY: No results found.    ASSESSMENT AND PLAN:   Melvin [name] is an 81 year old white male who is 18 years status post CABG surgery.  I had seen him one month ago which time he admitted to experiencing several episodes of chest pain which she had not experienced in a long time.  A prior nuclear perfusion study in March 2013 revealed fairly normal perfusion with the exception of a mild anteroseptal defect most likely related to his left bundle branch block.  His most recent nuclear study is different and now demonstrates new lateral wall ischemia.  He has been demonstrated to have mild-to-moderate aortic stenosis with reduced LV function with an ejection fraction of 35-40% which has remained stable over the past several years.  He has stable but chronic claudication symptoms and has documented one vessel runoff via the peroneal artery of his right lower extremity.  He has adequate wound healing.  He had a CardioNet monitor last fall, which revealed occasional isolated PVCs but he had an atrial run of approximately 11 beats.  He is unaware of any recent ectopy.  I reviewed his recent laboratory.  His lipid status is excellent on his current medical regimen.  I reviewed his most recent nuclear study with him in detail.  Is very possible that he has developed graft stenosis versus disease beyond the graft anastomosis.  He also has aortic stenosis.  I have recommended definitive cardiac catheterization, which will be a right and left heart cardiac catheterization for definitive evaluation.  I initially recommended that this be done next week, but he is concerned that his wife has commitments that he may not be able to have this  done at that time.  We will schedule this as soon as possible. The risks and benefits of a cardiac  catheterization including, but not limited to, death, stroke, MI, kidney damage and bleeding were discussed with the patient who indicates understanding and agrees to proceed.    Time spent: 25 minutes   Melvin Sine, MD, [facility]  10/16/2014 10:05 AM

## 2014-11-01 NOTE — CV Procedure (Addendum)
Melvin [name] is a 81 y.o. male   [id number]  [id number] LOCATION:  FACILITY: [facility]  PHYSICIAN: Lennette Biharihomas A. Kelly, MD, [facility] 07/14/1927   DATE OF PROCEDURE:  11/01/2014      RIGHT AND LEFT HEART CARDIAC CATHETERIZATION   HISTORY:  Melvin LappingBenjamin W Gongaware is a 81 y.o. male who has been on beta blocker therapy since the 1980s for ventricular ectopy.  In 1991 he underwent PTCA of his LAD.  In October 1998 he underwent CABG surgery 5 with LIMA to LAD, sequential vein to the PDA and PLA, vein to the OM1, and a to the second diagonal.  His chronic left bundle branch block and PVD .  He has developed mild aortic stenosis.  He recently has developed increasing exertional dyspnea and recurrent episodes of chest pain.  A nuclear study revealed new lateral wall ischemia.  He is now referred for right and left heart catheterization.   PROCEDURE:  Right and left heart catheterization: Swan-Ganz catheterization, cardiac Determinations by the thermodilution and Fick methods, coronary angiography in both the native coronary arteries, as well as the saphenous vein grafts and left internal mammary artery, supravalvular aortography, left ventriculography.  The patient was brought to the second floor  Cardiac cath lab in the postabsorptive state. Versed 1 mg and fentanyl 25 mcg were administered for conscious sedation. The right groin was prepped and draped in sterile fashion and a 6 French arterial sheath and 7 French venous sheath were inserted without difficulty. A Swan-Ganz catheter was advanced into the venous sheath and pressures were obtained in the right atrium, right ventricle, pulmonary artery, and pulmonary capillary wedge position. Cardiac outputs were obtained by the thermodilution and assumed Fick methods. Oxygen saturation was obtained in the pulmonary artery and aorta. A pigtail catheter was inserted and simultaneous AO/PA pressures were recorded. The pigtail catheter was advanced into the  left ventricle and simultaneous left ventricular and PCW pressures were recorded. Left ventriculography was performed in the RAO projection.  A left ventricle to aorta pullback was performed.  Supravalvular aortography was performed.  The pigtail catheter was then removed and diagnostic catheterization to delineate the coronary anatomy was performed utilizing 5 French Judkins 4 left and right diagnostic catheters, as well as a right bypass graft catheter, no torque right, multipurpose, and LIMA catheters.  Due to the angle that the subclavian arose from the proximal aortic root is difficulty in cannulating this, but ultimately this was successful.  following an aortic root shot, which visualized the subclavian takeoff and a long glide wire with LIMA catheter was necessary to selectively engage the left internal mammary artery.  All catheters were removed and the patient. Hemostasis was obtained by direct manual pressure. The patient tolerated the procedure well and returned to his room in satisfactory condition.   HEMODYNAMICS:   RA: 4/6  Mean 3 RV: 32/5 PA: 32/17 PC: 15  AO: 158/52 PA: 3216  LV: 161/4/9 AO: 154/48   Oxygen saturation in the aorta 98% and the pulmonary artery 75%  Cardiac output: 4.7 l/min (Thermo); 6.2 (Fick)  Cardiac index: 2.5 l/m/m2                3.3  Peak to peak aortic gradient 7; mean aortic valve gradient 10.  Aortic valve area 1.9 cm.  ANGIOGRAPHY:   Left main: Moderate size vessel which gave rise to 3 branches in the LAD system.  The circumflex was occluded at its origin.  LAD: There appear to be 3 branches  arising from the left main.  One gave rise to several septal perforating arteries, the other was diffusely diseased with 90% stenosis and appeared to be a proximal diagonal.  The most proximal one appeared to be an optional diagonal  vessel.  The LAD was occluded in the midsegment after giving rise to several septal perforating arteries.  Left circumflex:  Subtotally occluded at the ostium.  There was faint filling of the proximal circumflex and then the circumflex was occluded before giving rise to the marginal branches..   Right coronary artery: Totally occluded just beyond the ostium.  The left internal mammary artery came off the subclavian vessel which had a sharp angle takeoff arising from the proximal portion of the aortic root.  The LIMA was patent and anastomosed into the mid LAD.  The distal LAD beyond the LIMA insertion was small caliber but free of significant disease.  The SVG which supplied sequentially two marginal branches had a 95% very proximal near ostial stenosis.  The SVG that supplied the diagonal vessel was widely patent.  The SVG that supplied the mid  PDA was  Patent but there appeared to be a 30% narrowing just prior to the anastomosis into the PDA.  The distal RCA was large caliber and there was filling of the entire distal RCA which gave rise to an LV branch and large posterolateral vessel.    Left ventriculography  revealed moderate LV dysfunction with an ejection fraction of 35-40%.  There was moderate hypocontractility anterolaterally.  Supravalvular aortography reveals a upper normal aortic root size.  There was reduced.  Aortic valve excursion, and evidence for 2+ angiographic aortic insufficiency.   IMPRESSION:  Moderate LV dysfunction with an ejection fraction of 35-40% with anterolateral hypocontractility.  Mild aortic valve stenosis with 2+ angiographic aortic insufficiency.  Severe native coronary obstructive disease with total occlusion of the LAD after septal perforating arteries; subtotal occlusion of the circumflex at the ostium with total proximal occlusion; and total occlusion of the RCA just beyond the ostium.  Patent LIMA graft supplying the LAD.  95% focal very proximal stenosis in the vein graft sequentially supplying 2 marginal branches.  Patent saphenous vein graft supplying the diagonal  vessel.  Patent saphenous vein graft supplying the mid PDA with 30% narrowing at the anastomosis and a large distal RCA system  RECOMMENDATION:  The patient will be hydrated.  Following this procedure.  He will be started on oral antiplatelet therapy.  His ARB therapy will be held.  Plans are to perform PCI to the very proximal saphenous vein graft 95% stenosis tomorrow.    Lennette Bihari, MD, [facility] 11/01/2014 12:40 PM

## 2014-11-01 NOTE — Progress Notes (Signed)
ANTICOAGULATION CONSULT NOTE - Initial Consult  Pharmacy Consult for Heparin s/p cath Indication: chest pain/ACS  - planned PCI  Allergies  Allergen Reactions  . Other     Patient is allergic to something but doesn't know the name.  . Pramipexole     Patient Measurements: Height: 5\' 7"  (170.2 cm) Weight: 172 lb (78.019 kg) IBW/kg (Calculated) : 66.1  Vital Signs: Temp: 97.7 F (36.5 C) (04/26 1324) Temp Source: Oral (04/26 1324) BP: 159/48 mmHg (04/26 1324) Pulse Rate: 51 (04/26 1324)  Labs: No results for input(s): HGB, HCT, PLT, APTT, LABPROT, INR, HEPARINUNFRC, CREATININE, CKTOTAL, CKMB, TROPONINI in the last 72 hours.  Estimated Creatinine Clearance: 61.9 mL/min (by C-G formula based on Cr of 0.83).   Medical History: Past Medical History  Diagnosis Date  . High cholesterol   . Unspecified hereditary and idiopathic peripheral neuropathy 10/01/2012  . RLS (restless legs syndrome) 10/01/2012  . Hypertension   . PAD (peripheral artery disease)   . PVC (premature ventricular contraction) 04/26/1997    on beta blocker/pt had cardio net monitor 06/23/2012  . S/P CABG x 5 1998  . LBBB (left bundle branch block)     Assessment: 81 year old male to begin heparin 8 hours after sheath removal in preparation for PCI 4/27  Goal of Therapy:  Heparin level 0.3-0.7 units/ml Monitor platelets by anticoagulation protocol: Yes   Plan:  Begin heparin at 1200 units / hr starting at 9 pm Heparin level and CBC in AM  Thank you. Okey RegalLisa Seriah Brotzman, PharmD [phone]  Elwin SleightPowell, Rayshon Albaugh Kay 11/01/2014,3:23 PM

## 2014-11-01 NOTE — Progress Notes (Signed)
Site area: right groin a 5 french arterial sheath na d a 7 french venous sheath Site Prior to Removal:  Level 0  Pressure Applied For 20 MINUTES    Minutes Beginning at 1240p  Manual:   Yes.    Patient Status During Pull:  stable  Post Pull Groin Site:  Level 0  Post Pull Instructions Given:  Yes.    Post Pull Pulses Present:  Yes.    Dressing Applied:  Yes.    Comments:  VS remain stable during sheath pull.  Pt denies any discomfort at this time at site

## 2014-11-01 NOTE — Interval H&P Note (Signed)
Cath Lab Visit (complete for each Cath Lab visit)  Clinical Evaluation Leading to the Procedure:   ACS: No.  Non-ACS:    Anginal Classification: CCS III  Anti-ischemic medical therapy: Maximal Therapy (2 or more classes of medications)  Non-Invasive Test Results: High-risk stress test findings: cardiac mortality >3%/year  Prior CABG: Previous CABG      History and Physical Interval Note:  11/01/2014 10:51 AM  Earle GellBenjamin W Leonhardt  has presented today for surgery, with the diagnosis of abnormal stress test  The various methods of treatment have been discussed with the patient and family. After consideration of risks, benefits and other options for treatment, the patient has consented to  Procedure(s): LEFT AND RIGHT HEART CATHETERIZATION WITH CORONARY ANGIOGRAM (N/A) as a surgical intervention .  The patient's history has been reviewed, patient examined, no change in status, stable for surgery.  I have reviewed the patient's chart and labs.  Questions were answered to the patient's satisfaction.     KELLY,THOMAS A

## 2014-11-02 ENCOUNTER — Encounter (HOSPITAL_COMMUNITY): Payer: Self-pay | Admitting: Cardiovascular Disease

## 2014-11-02 ENCOUNTER — Encounter (HOSPITAL_COMMUNITY)
Admission: RE | Disposition: A | Discharge status: Home / Self Care | Payer: Self-pay | Source: Ambulatory Visit | Attending: Cardiovascular Disease

## 2014-11-02 DIAGNOSIS — I25710 Atherosclerosis of autologous vein coronary artery bypass graft(s) with unstable angina pectoris: Principal | ICD-10-CM

## 2014-11-02 HISTORY — PX: PERCUTANEOUS CORONARY STENT INTERVENTION (PCI-S): SHX5485

## 2014-11-02 LAB — HEPARIN LEVEL (UNFRACTIONATED): Heparin Unfractionated: 0.51 IU/mL (ref 0.30–0.70)

## 2014-11-02 LAB — BASIC METABOLIC PANEL
Anion gap: 9 (ref 5–15)
BUN: 13 mg/dL (ref 6–23)
CHLORIDE: 102 mmol/L (ref 96–112)
CO2: 28 mmol/L (ref 19–32)
Calcium: 8.9 mg/dL (ref 8.4–10.5)
Creatinine, Ser: 0.86 mg/dL (ref 0.50–1.35)
GFR calc Af Amer: 90 mL/min — ABNORMAL LOW (ref >90)
GFR calc non Af Amer: 77 mL/min — ABNORMAL LOW (ref >90)
Glucose, Bld: 140 mg/dL — ABNORMAL HIGH (ref 70–99)
POTASSIUM: 3.8 mmol/L (ref 3.5–5.1)
Sodium: 139 mmol/L (ref 135–145)

## 2014-11-02 LAB — CBC
HCT: 38.3 % — ABNORMAL LOW (ref 39.0–52.0)
HEMOGLOBIN: 12.6 g/dL — AB (ref 13.0–17.0)
MCH: 31.3 pg (ref 26.0–34.0)
MCHC: 32.9 g/dL (ref 30.0–36.0)
MCV: 95.3 fL (ref 78.0–100.0)
Platelets: 131 10*3/uL — ABNORMAL LOW (ref 150–400)
RBC: 4.02 MIL/uL — AB (ref 4.22–5.81)
RDW: 13.7 % (ref 11.5–15.5)
WBC: 8.2 10*3/uL (ref 4.0–10.5)

## 2014-11-02 LAB — GLUCOSE, CAPILLARY
GLUCOSE-CAPILLARY: 149 mg/dL — AB (ref 70–99)
GLUCOSE-CAPILLARY: 136 mg/dL — AB (ref 70–99)
GLUCOSE-CAPILLARY: 204 mg/dL — AB (ref 70–99)
Glucose-Capillary: 150 mg/dL — ABNORMAL HIGH (ref 70–99)
Glucose-Capillary: 127 mg/dL — ABNORMAL HIGH (ref 70–99)

## 2014-11-02 SURGERY — PERCUTANEOUS CORONARY STENT INTERVENTION (PCI-S)
Anesthesia: LOCAL

## 2014-11-02 MED ORDER — SODIUM CHLORIDE 0.9 % IV SOLN
INTRAVENOUS | Status: DC
  Administered 2014-11-02: 12:00:00 via INTRAVENOUS

## 2014-11-02 MED ORDER — HEPARIN (PORCINE) IN NACL 2-0.9 UNIT/ML-% IJ SOLN
INTRAMUSCULAR | Status: AC
  Filled 2014-11-02: qty 1500

## 2014-11-02 MED ORDER — HYDRALAZINE HCL 25 MG PO TABS
25.0000 mg | ORAL_TABLET | Freq: Three times a day (TID) | ORAL | Status: DC
  Administered 2014-11-02 – 2014-11-03 (×4): 25 mg via ORAL
  Filled 2014-11-02 (×8): qty 1

## 2014-11-02 MED ORDER — SODIUM CHLORIDE 0.9 % IV SOLN: INTRAVENOUS | Status: DC

## 2014-11-02 MED ORDER — SODIUM CHLORIDE 0.9 % IJ SOLN: 3.0000 mL | INTRAMUSCULAR | Status: DC | PRN

## 2014-11-02 MED ORDER — NITROGLYCERIN 1 MG/10 ML FOR IR/CATH LAB
INTRA_ARTERIAL | Status: AC
  Filled 2014-11-02: qty 10

## 2014-11-02 MED ORDER — MIDAZOLAM HCL 2 MG/2ML IJ SOLN
INTRAMUSCULAR | Status: AC
Start: 2014-11-02 — End: 2014-11-02
  Filled 2014-11-02: qty 2

## 2014-11-02 MED ORDER — FENTANYL CITRATE (PF) 100 MCG/2ML IJ SOLN
INTRAMUSCULAR | Status: AC
  Filled 2014-11-02: qty 2

## 2014-11-02 MED ORDER — ASPIRIN 81 MG PO CHEW
81.0000 mg | CHEWABLE_TABLET | ORAL | Status: AC
  Administered 2014-11-02: 81 mg via ORAL
  Filled 2014-11-02: qty 1

## 2014-11-02 MED ORDER — CLOPIDOGREL BISULFATE 75 MG PO TABS: 75.0000 mg | ORAL_TABLET | Freq: Every day | ORAL | Status: DC

## 2014-11-02 MED ORDER — LIDOCAINE HCL (PF) 1 % IJ SOLN
INTRAMUSCULAR | Status: AC
  Filled 2014-11-02: qty 30

## 2014-11-02 MED ORDER — ASPIRIN EC 81 MG PO TBEC
81.0000 mg | DELAYED_RELEASE_TABLET | Freq: Every day | ORAL | Status: DC
  Administered 2014-11-03: 10:00:00 81 mg via ORAL
  Filled 2014-11-02: qty 1

## 2014-11-02 MED ORDER — ACETAMINOPHEN 325 MG PO TABS: 650.0000 mg | ORAL_TABLET | ORAL | Status: DC | PRN

## 2014-11-02 MED ORDER — SODIUM CHLORIDE 0.9 % IJ SOLN
3.0000 mL | Freq: Two times a day (BID) | INTRAMUSCULAR | Status: DC
  Administered 2014-11-02: 10:00:00 3 mL via INTRAVENOUS

## 2014-11-02 MED ORDER — ONDANSETRON HCL 4 MG/2ML IJ SOLN: 4.0000 mg | Freq: Four times a day (QID) | INTRAMUSCULAR | Status: DC | PRN

## 2014-11-02 MED ORDER — SODIUM CHLORIDE 0.9 % IV SOLN: 250.0000 mL | INTRAVENOUS | Status: DC | PRN

## 2014-11-02 NOTE — Progress Notes (Signed)
ANTICOAGULATION CONSULT NOTE - Follow Up Consult  Pharmacy Consult for heparin Indication: chest pain/ACS   Labs:  Recent Labs  11/02/14 0328  HGB 12.6*  HCT 38.3*  PLT 131*  HEPARINUNFRC 0.51    Assessment/Plan:  81yo male therapeutic on heparin with initial dosing prior to planned PCI. Will continue gtt at current rate and confirm stable with additional level if cath postponed.   Vernard GamblesVeronda Kenroy Timberman, PharmD, BCPS  11/02/2014,4:27 AM

## 2014-11-02 NOTE — Progress Notes (Signed)
Site area: right groin  Site Prior to Removal:  Level 0  Pressure Applied For 20 MINUTES    Minutes Beginning at 2006  Manual:   Yes.    Patient Status During Pull:  stable  Post Pull Groin Site:  Level 0  Post Pull Instructions Given:  Yes.    Post Pull Pulses Present:  Yes.    Dressing Applied:  Yes.    Comments:

## 2014-11-02 NOTE — Progress Notes (Signed)
    Subjective: No complaints  Objective: Vital signs in last 24 hours: Temp:  [97.6 F (36.4 C)-98.7 F (37.1 C)] 97.9 F (36.6 C) (04/27 0421) Pulse Rate:  [45-58] 58 (04/27 0421) Resp:  [13-24] 16 (04/27 0421) BP: (131-177)/(41-64) 162/64 mmHg (04/27 0421) SpO2:  [93 %-98 %] 94 % (04/27 0421) Weight:  [172 lb (78.019 kg)-173 lb 11.6 oz (78.8 kg)] 173 lb 11.6 oz (78.8 kg) (04/27 0444)    Intake/Output from previous day: 04/26 0701 - 04/27 0700 In: 2082.6 [P.O.:600; I.V.:1482.6] Out: 3525 [Urine:3525] Intake/Output this shift: Total I/O In: 1001.8 [P.O.:240; I.V.:761.8] Out: 2725 [Urine:2725]  Medications Scheduled Meds: . amLODipine  10 mg Oral Daily  . aspirin EC  81 mg Oral Daily  . atenolol  100 mg Oral BID  . atenolol  25 mg Oral Daily  . atorvastatin  40 mg Oral q1800  . clopidogrel  75 mg Oral Daily  . gabapentin  300 mg Oral QHS  . insulin aspart  0-15 Units Subcutaneous TID WC  . niacin  1,500 mg Oral QHS  . oxymetazoline  2 spray Each Nare QHS  . polycarbophil  625 mg Oral Daily  . sodium chloride  3 mL Intravenous Q12H  . torsemide  20 mg Oral BID   Continuous Infusions: . sodium chloride Stopped (11/02/14 0445)  . sodium chloride 75 mL/hr at 11/02/14 0600  . heparin 1,200 Units/hr (11/02/14 0600)   PRN Meds:.sodium chloride, acetaminophen, nitroGLYCERIN, ondansetron (ZOFRAN) IV, sodium chloride  PE: General appearance: alert, cooperative and no distress Lungs: clear to auscultation bilaterally Heart: regular rate and rhythm and 2/6 harsh systolic MM Extremities: No LEE Pulses: 2+ and symmetric Skin: Warm and dry.  Right groin stable.  No hematoma or ecchymosis.  Neurologic: Grossly normal  Lab Results:   Recent Labs  11/02/14 0328  WBC 8.2  HGB 12.6*  HCT 38.3*  PLT 131*   BMET  Recent Labs  11/02/14 0328  NA 139  K 3.8  CL 102  CO2 28  GLUCOSE 140*  BUN 13  CREATININE 0.86  CALCIUM 8.9    Assessment/Plan  Active  Problems:   CAD (coronary artery disease)  Plan:  SP left heart cath revealing 95% focal very proximal stenosis in the vein graft sequentially supplying 2 marginal branches. PCI planned for today.  SCr WNL.   BP poorly controlled.  Add hydralazine 25mg  three times daily.  Hydrated overnight.  PO torsemide 20mg  BID given.  Net fluids: -1.4L.    LOS: 1 day    HAGER, BRYAN PA-C 11/02/2014 6:47 AM   Patient seen and examined. Agree with assessment and plan. Plan for PCI to SVG today.   Lennette Biharihomas A. Eathon Valade, MD, [facility] 11/02/2014 10:45 AM

## 2014-11-02 NOTE — CV Procedure (Signed)
Melvin [name] is a 81 y.o. male    [id number]  [id number] LOCATION:  FACILITY: [facility]  PHYSICIAN: Troy Sine, MD, [facility] Jul 25, 1927   DATE OF PROCEDURE:  11/02/2014    CARDIAC CATHETERIZATION     HISTORY:    Melvin [name] is a 81 y.o. male who has been on beta blocker therapy since the 1980s for ventricular ectopy.  In 1991 he underwent PTCA of his LAD.  In October 1998 he underwent CBG surgery 5 with LIMA to LAD, sequential vein to the PDA and PLA, vein to the OM1, and a to the second diagonal.  His chronic left bundle branch block and PVD .  He has developed mild aortic stenosis.  He underwent diagnostic cardiac catheterization yesterday and was found to have a 95% focal stenosis at the very proximal portion of the vein graft supplying the marginal vessel.  Please refer to the complete diagnostic catheterization report.  He was hydrated post procedure.  He returns today for intervention to the saphenous vein graft.   PROCEDURE:  Percutaneous coronary intervention with PTCA/DES stenting to the proximal portion of the SVG supplying the circumflex marginal vessel.  The patient was brought to the [facility] cardiac catherization laboratory in the fasting state. He  was premedicated with Versed 1 mg and fentanyl 25 g initially.  He received an additional 1 mg Versed prior to the procedure. His right groin was prepped and shaved in usual sterile fashion. Xylocaine 1% was used for local anesthesia. A 6 French sheath was inserted into the R femoral artery.  The patient received Angiomax bolus plus infusion.  He has been on oral Plavix and received his Plavix this morning.  A 6 French left bypass graft guiding catheter was inserted.  The lesion today appear to be 99% eccentrically stenosed.  It was felt that this was too tight to pass a filter wire.  The [place] wire was then inserted without difficulty.  Predilatation was done with a 2.512 mm U4 balloon.  A Resolute integrity DES  3.015 mm stent was deployed at 13 and 14 atm.  An [place] Euphora 3.2512 mm loop was used for post stent dilatation up to 3.25 mm.  Angiography confirmed an excellent angiographic result.  The arterial sheath was sutured in place.  Angiomax was discontinued.  He tolerated the procedure well and returned to his room in stable condition chest pain-free with stable hemodynamics.  HEMODYNAMICS:   Central Aorta: 181/62    ANGIOGRAPHY:  The SVG   was moderate size vessel that had a 99% eccentric stenosis in its most proximal portion.  This vessel seemed to supply 2 marginal branches.   Following successful PCI with PTCA/DES stenting with a 3.015 mm Resolute integrity DES stent postdilated to 3.25 mmm, the 99% stenosis was reduced to 0%.  There was brisk TIMI-3 flow.  There is no evidence for dissection.  Total contrast used: 70 cc.  Estimate blood loss: Minimal  IMPRESSION:  Successful percutaneous coronary intervention to the saphenous vein graft supplying the circumflex marginal system with a eccentric 99% stenosis being reduced to 0% with insertion of a 3.015 mm Resolute integrity DES stent postdilated 3.25 mm.  RECOMMENDATION:  Patient will continue with dual antiplatelet therapy.  Medical therapy for concomitant CAD, hypertension, and history of ventricular ectopy.    Troy Sine, MD, [facility] 11/02/2014 5:51 PM

## 2014-11-03 ENCOUNTER — Telehealth: Payer: Self-pay | Admitting: Cardiovascular Disease

## 2014-11-03 ENCOUNTER — Telehealth: Payer: Self-pay | Admitting: Physician Assistant

## 2014-11-03 ENCOUNTER — Encounter (HOSPITAL_COMMUNITY): Payer: Self-pay | Admitting: Physician Assistant

## 2014-11-03 DIAGNOSIS — I25708 Atherosclerosis of coronary artery bypass graft(s), unspecified, with other forms of angina pectoris: Secondary | ICD-10-CM

## 2014-11-03 DIAGNOSIS — I200 Unstable angina: Secondary | ICD-10-CM

## 2014-11-03 LAB — BASIC METABOLIC PANEL
ANION GAP: 9 (ref 5–15)
BUN: 9 mg/dL (ref 6–23)
CALCIUM: 8.8 mg/dL (ref 8.4–10.5)
CO2: 25 mmol/L (ref 19–32)
CREATININE: 0.87 mg/dL (ref 0.50–1.35)
Chloride: 107 mmol/L (ref 96–112)
GFR calc Af Amer: 89 mL/min — ABNORMAL LOW (ref >90)
GFR, EST NON AFRICAN AMERICAN: 77 mL/min — AB (ref >90)
Glucose, Bld: 128 mg/dL — ABNORMAL HIGH (ref 70–99)
Potassium: 3.8 mmol/L (ref 3.5–5.1)
Sodium: 141 mmol/L (ref 135–145)

## 2014-11-03 LAB — CBC
HEMATOCRIT: 38 % — AB (ref 39.0–52.0)
Hemoglobin: 12.3 g/dL — ABNORMAL LOW (ref 13.0–17.0)
MCH: 30.9 pg (ref 26.0–34.0)
MCHC: 32.4 g/dL (ref 30.0–36.0)
MCV: 95.5 fL (ref 78.0–100.0)
Platelets: 127 10*3/uL — ABNORMAL LOW (ref 150–400)
RBC: 3.98 MIL/uL — ABNORMAL LOW (ref 4.22–5.81)
RDW: 13.8 % (ref 11.5–15.5)
WBC: 7.5 10*3/uL (ref 4.0–10.5)

## 2014-11-03 LAB — POCT ACTIVATED CLOTTING TIME: ACTIVATED CLOTTING TIME: 436 s

## 2014-11-03 LAB — GLUCOSE, CAPILLARY: GLUCOSE-CAPILLARY: 121 mg/dL — AB (ref 70–99)

## 2014-11-03 MED ORDER — ATORVASTATIN CALCIUM 40 MG PO TABS: 40.0000 mg | ORAL_TABLET | Freq: Every day | ORAL | Status: DC

## 2014-11-03 MED ORDER — AMLODIPINE BESYLATE 10 MG PO TABS: 10.0000 mg | ORAL_TABLET | Freq: Every day | ORAL | Status: DC

## 2014-11-03 MED ORDER — ATENOLOL 100 MG PO TABS: 50.0000 mg | ORAL_TABLET | Freq: Every day | ORAL | Status: DC

## 2014-11-03 MED ORDER — ATORVASTATIN CALCIUM 40 MG PO TABS
40.0000 mg | ORAL_TABLET | Freq: Every day | ORAL | Status: DC
Start: 2014-11-03 — End: 2015-11-20

## 2014-11-03 MED ORDER — HYDRALAZINE HCL 25 MG PO TABS: 25.0000 mg | ORAL_TABLET | Freq: Three times a day (TID) | ORAL | Status: DC

## 2014-11-03 MED ORDER — CLOPIDOGREL BISULFATE 75 MG PO TABS: 75.0000 mg | ORAL_TABLET | Freq: Every day | ORAL | Status: DC

## 2014-11-03 NOTE — Progress Notes (Signed)
Patient Name: Melvin [name] Date of Encounter: 11/03/2014  Principal Problem:   Unstable angina pectoris Active Problems:   Hypertension   LBBB (left bundle branch block)   Non-insulin dependent type 2 diabetes mellitus   CAD (coronary artery disease)   Primary Cardiologist: Dr Tresa EndoKelly  Patient Profile: 81 yo male w/ hx PVCs, CABG '98, PCI, ICM w/ EF 35-40%, AS, had cath 04/26, staged PCI SVG-OM 4/27  SUBJECTIVE: No chest pain or shortness of breath overnight  OBJECTIVE Filed Vitals:   11/02/14 2200 11/02/14 2300 11/03/14 0000 11/03/14 0400  BP: 136/43 127/50 160/50 181/59  Pulse:   53 57  Temp:   98.4 F (36.9 C) 98.5 F (36.9 C)  TempSrc:   Oral Oral  Resp: 19 16 20 23   Height:      Weight:   174 lb 9.7 oz (79.2 kg)   SpO2:   97% 94%    Intake/Output Summary (Last 24 hours) at 11/03/14 0742 Last data filed at 11/03/14 0559  Gross per 24 hour  Intake 1959.17 ml  Output   1950 ml  Net   9.17 ml   Filed Weights   11/02/14 0005 11/02/14 0444 11/03/14 0000  Weight: 173 lb 11.6 oz (78.8 kg) 173 lb 11.6 oz (78.8 kg) 174 lb 9.7 oz (79.2 kg)    PHYSICAL EXAM General: Well developed, well nourished, male in no acute distress. Head: Normocephalic, atraumatic.  Neck: Supple without bruits, JVD not elevated. Lungs:  Resp regular and unlabored, decreased breath sounds left base. Heart: RRR, S1, S2, no S3, S4, 2/6 murmur; no rub. Abdomen: Soft, non-tender, non-distended, BS + x 4.  Extremities: No clubbing, cyanosis, no edema. Right groin with improving ecchymosis, no hematoma, no bruit noted Neuro: Alert and oriented X 3. Moves all extremities spontaneously. Psych: Normal affect.  LABS: CBC:  Recent Labs  11/02/14 0328 11/03/14 0342  WBC 8.2 7.5  HGB 12.6* 12.3*  HCT 38.3* 38.0*  MCV 95.3 95.5  PLT 131* 127*   Basic Metabolic Panel:  Recent Labs  40/98/1104/27/16 0328 11/03/14 0342  NA 139 141  K 3.8 3.8  CL 102 107  CO2 28 25  GLUCOSE 140* 128*    BUN 13 9  CREATININE 0.86 0.87  CALCIUM 8.9 8.8    TELE: Sinus rhythm with frequent PVCs       Cardiac cath results: 11/02/2014  The SVG was moderate size vessel that had a 99% eccentric stenosis in its most proximal portion. This vessel seemed to supply 2 marginal branches. Following successful PCI with PTCA/DES stenting with a 3.015 mm Resolute integrity DES stent postdilated to 3.25 mmm, the 99% stenosis was reduced to 0%. There was brisk TIMI-3 flow. There is no evidence for dissection. IMPRESSION: Successful percutaneous coronary intervention to the saphenous vein graft supplying the circumflex marginal system with a eccentric 99% stenosis being reduced to 0% with insertion of a 3.015 mm Resolute integrity DES stent postdilated 3.25 mm. RECOMMENDATION: Patient will continue with dual antiplatelet therapy. Medical therapy for concomitant CAD, hypertension, and history of ventricular ectopy.   Current Medications:  . amLODipine  10 mg Oral Daily  . aspirin EC  81 mg Oral Daily  . atenolol  100 mg Oral BID  . atenolol  25 mg Oral Daily  . atorvastatin  40 mg Oral q1800  . clopidogrel  75 mg Oral Daily  . gabapentin  300 mg Oral QHS  . hydrALAZINE  25 mg Oral 3 times  per day  . insulin aspart  0-15 Units Subcutaneous TID WC  . niacin  1,500 mg Oral QHS  . oxymetazoline  2 spray Each Nare QHS  . polycarbophil  625 mg Oral Daily  . torsemide  20 mg Oral BID   . sodium chloride      ASSESSMENT AND PLAN: Principal Problem:   Unstable angina pectoris - S/P DES to the SVG-OM - Continue aspirin, Plavix, beta blocker and statin  Active Problems:   Hypertension - SBP 130s-160s last 24 hours, heart rate 50s, rarely 40s - Cozaar 50 mg twice a day held due to procedures, restart after discharge - Norvasc and hydralazine are new, continue at discharge.    LBBB (left bundle branch block) - Also has PVCs    Non-insulin dependent type 2 diabetes mellitus - Restart  metformin and 48 hours    CAD (coronary artery disease) - See above  Plan: Ambulate and see how tolerated, discharge later today  Signed, Theodore Demark , PA-C 7:42 AM 11/03/2014    Patient seen and examined. Agree with assessment and plan. Feels much better; no chest pain; BP elevated, cozaar had been held for cath will resume. Renal fxn stable post cath. Atenolol to 100 mg am, 50 mg pm and adjust as outpatient. DC today.   Lennette Bihari, MD, [facility] 11/03/2014 9:18 AM

## 2014-11-03 NOTE — Discharge Summary (Signed)
CARDIOLOGY DISCHARGE SUMMARY   Patient ID: Melvin [name] MRN: [id number] DOB/AGE: [birth year] 79 y.o.  Admit date: 11/01/2014 Discharge date: 11/03/2014  PCP: Colette Ribas, MD Primary Cardiologist: Dr. Tresa Endo  Primary Discharge Diagnosis:  Unstable angina Secondary Discharge Diagnosis:    Hypertension   LBBB (left bundle branch block)   Non-insulin dependent type 2 diabetes mellitus   CAD (coronary artery disease)   Thrombocytopenia - platelets 164>>127 after PCI   Mild AS  Procedures: Right and left heart catheterization: Swan-Ganz catheterization, cardiac Determinations by the thermodilution and Fick methods, coronary angiography in both the native coronary arteries, as well as the saphenous vein grafts and left internal mammary artery, supravalvular aortography, left ventriculography. Recatheterization with Percutaneous coronary intervention with PTCA/DES stenting to the proximal portion of the SVG supplying the circumflex marginal vessel  Hospital Course: Melvin [name] is a 81 y.o. male with a long history of CAD. He has a history of frequent PVCs, CABG '98, PCI, ICM w/ EF 35-40%, AS. He was evaluated by Dr. Tresa Endo for chest pressure and had a Myoview which was abnormal. He was scheduled for cardiac catheterization and came to the hospital for the procedure on 11/01/2014.  Right and left cardiac catheterization results are below. Dr. Tresa Endo felt the major problem was a 95% stenosis in his SVG-OM. Because of the dye load, he was held and hydrated, with staged intervention scheduled for 04/27.  He had staged PCI to the SVG-OM with a drug-eluting stent, reducing the stenosis to 0. He tolerated the procedure well.  His blood pressure needed to improve blood pressure control, to that and hydralazine and amlodipine were added to his medication regimen.  On 04/28, he was seen by Dr. Katrinka Blazing and all data were reviewed. His post procedure labs were stable. He had mild  thrombocytopenia but was asymptomatic and having no bleeding issues. His renal function was stable. He ambulated with cardiac rehabilitation and was educated on stent restrictions, heart-healthy lifestyle modifications and exercise guidelines. His right groin had a small amount of ecchymosis but no hematoma and no bruit. No further inpatient workup was indicated and he is considered stable for discharge, to follow up as an outpatient.  Labs:   Lab Results  Component Value Date   WBC 7.5 11/03/2014   HGB 12.3* 11/03/2014   HCT 38.0* 11/03/2014   MCV 95.5 11/03/2014   PLT 127* 11/03/2014     Recent Labs Lab 11/03/14 0342  NA 141  K 3.8  CL 107  CO2 25  BUN 9  CREATININE 0.87  CALCIUM 8.8  GLUCOSE 128*   Cardiac Cath: 11/01/2014 HEMODYNAMICS:  RA: 4/6 Mean 3 RV: 32/5 PA: 32/17 PC: 15 AO: 158/52 PA: 3216 LV: 161/4/9 AO: 154/48 Oxygen saturation in the aorta 98% and the pulmonary artery 75%  Cardiac output: 4.7 l/min (Thermo); 6.2 (Fick)  Cardiac index: 2.5 l/m/m2 3.3 Peak to peak aortic gradient 7; mean aortic valve gradient 10. Aortic valve area 1.9 cm. ANGIOGRAPHY:  Left main: Moderate size vessel which gave rise to 3 branches in the LAD system. The circumflex was occluded at its origin. LAD: There appear to be 3 branches arising from the left main. One gave rise to several septal perforating arteries, the other was diffusely diseased with 90% stenosis and appeared to be a proximal diagonal. The most proximal one appeared to be an optional diagonal vessel. The LAD was occluded in the midsegment after giving rise to several septal perforating arteries. Left circumflex:  Subtotally occluded at the ostium. There was faint filling of the proximal circumflex and then the circumflex was occluded before giving rise to the marginal branches..  Right coronary artery: Totally occluded just beyond the ostium. The left internal mammary artery came off the  subclavian vessel which had a sharp angle takeoff arising from the proximal portion of the aortic root. The LIMA was patent and anastomosed into the mid LAD. The distal LAD beyond the LIMA insertion was small caliber but free of significant disease. The SVG which supplied sequentially two marginal branches had a 95% very proximal near ostial stenosis. The SVG that supplied the diagonal vessel was widely patent. The SVG that supplied the mid PDA was Patent but there appeared to be a 30% narrowing just prior to the anastomosis into the PDA. The distal RCA was large caliber and there was filling of the entire distal RCA which gave rise to an LV branch and large posterolateral vessel.  Left ventriculography revealed moderate LV dysfunction with an ejection fraction of 35-40%. There was moderate hypocontractility anterolaterally. Supravalvular aortography reveals a upper normal aortic root size. There was reduced. Aortic valve excursion, and evidence for 2+ angiographic aortic insufficiency. IMPRESSION: Moderate LV dysfunction with an ejection fraction of 35-40% with anterolateral hypocontractility. Mild aortic valve stenosis with 2+ angiographic aortic insufficiency. Severe native coronary obstructive disease with total occlusion of the LAD after septal perforating arteries; subtotal occlusion of the circumflex at the ostium with total proximal occlusion; and total occlusion of the RCA just beyond the ostium. Patent LIMA graft supplying the LAD. 95% focal very proximal stenosis in the vein graft sequentially supplying 2 marginal branches. Patent saphenous vein graft supplying the diagonal vessel. Patent saphenous vein graft supplying the mid PDA with 30% narrowing at the anastomosis and a large distal RCA system RECOMMENDATION: The patient will be hydrated. Following this procedure. He will be started on oral antiplatelet therapy. His ARB therapy will be held. Plans are to perform PCI to the  very proximal saphenous vein graft 95% stenosis tomorrow.  Staged PCI: 11/02/2014 The SVG was moderate size vessel that had a 99% eccentric stenosis in its most proximal portion. This vessel seemed to supply 2 marginal branches. Following successful PCI with PTCA/DES stenting with a 3.015 mm Resolute integrity DES stent postdilated to 3.25 mmm, the 99% stenosis was reduced to 0%. There was brisk TIMI-3 flow. There is no evidence for dissection. IMPRESSION: Successful percutaneous coronary intervention to the saphenous vein graft supplying the circumflex marginal system with a eccentric 99% stenosis being reduced to 0% with insertion of a 3.015 mm Resolute integrity DES stent postdilated 3.25 mm. RECOMMENDATION: Patient will continue with dual antiplatelet therapy. Medical therapy for concomitant CAD, hypertension, and history of ventricular ectopy.  EKG: Sinus rhythm, frequent PVCs, multifocal, left bundle branch block is old  FOLLOW UP PLANS AND APPOINTMENTS Allergies  Allergen Reactions  . Other     Patient is allergic to something but doesn't know the name.  . Pramipexole      Medication List    STOP taking these medications        amoxicillin 500 MG capsule  Commonly known as:  AMOXIL      TAKE these medications        amLODipine 10 MG tablet  Commonly known as:  NORVASC  Take 1 tablet (10 mg total) by mouth daily.     aspirin 81 MG tablet  Take 81 mg by mouth daily.     atenolol  100 MG tablet  Commonly known as:  TENORMIN  Take 0.5-1 tablets (50-100 mg total) by mouth daily. Take 100 mg every a.m., 50 mg p.m.     atorvastatin 40 MG tablet  Commonly known as:  LIPITOR  Take 1 tablet (40 mg total) by mouth daily at 6 PM.     CALTRATE 600 PLUS-VIT D PO  Take 1 tablet by mouth daily.     clopidogrel 75 MG tablet  Commonly known as:  PLAVIX  Take 1 tablet (75 mg total) by mouth daily.     gabapentin 300 MG capsule  Commonly known as:  NEURONTIN  Take 1  capsule (300 mg total) by mouth at bedtime.     hydrALAZINE 25 MG tablet  Commonly known as:  APRESOLINE  Take 1 tablet (25 mg total) by mouth every 8 (eight) hours.     metformin 1000 MG (OSM) 24 hr tablet  Commonly known as:  FORTAMET  Take 1,000 mg by mouth 2 (two) times daily with a meal.  Notes to Patient:  HOLD for 48 hours, restart on 11/05/2014     multivitamin-iron-minerals-folic acid chewable tablet  Chew 1 tablet by mouth daily.     PRESERVISION AREDS 2 PO  Take 1 capsule by mouth 2 (two) times daily.     niacin 750 MG CR tablet  Commonly known as:  NIASPAN  Take 2 tablets (1,500 mg total) by mouth at bedtime.     NITROSTAT 0.4 MG SL tablet  Generic drug:  nitroGLYCERIN  Place 0.4 mg under the tongue every 5 (five) minutes as needed for chest pain.     oxymetazoline 0.05 % nasal spray  Commonly known as:  AFRIN  Place 2 sprays into both nostrils 3 (three) times daily as needed for congestion (Allergies).     polycarbophil 625 MG tablet  Commonly known as:  FIBERCON  Take 625 mg by mouth daily.     torsemide 20 MG tablet  Commonly known as:  DEMADEX  Take 1 tablet (20 mg total) by mouth 2 (two) times daily.     zoledronic acid 5 MG/100ML Soln injection  Commonly known as:  RECLAST  Inject 100 mLs (5 mg total) into the vein once.        Discharge Instructions    Amb Referral to Cardiac Rehabilitation    Complete by:  As directed   Congestive Heart Failure: If diagnosis is Heart Failure, patient MUST meet each of the CMS criteria: 1. Left Ventricular Ejection Fraction </= 35% 2. NYHA class II-IV symptoms despite being on optimal heart failure therapy for at least 6 weeks. 3. Stable = have not had a recent (<6 weeks) or planned (<6 months) major cardiovascular hospitalization or procedure  Program Details: - Physician supervised classes - 1-3 classes per week over a 12-18 week period, generally for a total of 36 sessions  Physician Certification: I  certify that the above Cardiac Rehabilitation treatment is medically necessary and is medically approved by me for treatment of this patient. The patient is willing and cooperative, able to ambulate and medically stable to participate in exercise rehabilitation. The participant's progress and Individualized Treatment Plan will be reviewed by the Medical Director, Cardiac Rehab staff and as indicated by the Referring/Ordering Physician.  Diagnosis:  PCI     Diet - low sodium heart healthy    Complete by:  As directed      Diet Carb Modified    Complete by:  As directed  Increase activity slowly    Complete by:  As directed           Follow-up Information    Follow up with Lennette Bihari, MD.   Specialty:  Cardiology   Why:  The office will call.   Contact information:   [address] [place] [address] [phone]       BRING ALL MEDICATIONS WITH YOU TO FOLLOW UP APPOINTMENTS  Time spent with patient to include physician time: 48 min Signed: Theodore Demark, PA-C 11/03/2014, 10:23 AM Co-Sign MD

## 2014-11-03 NOTE — Telephone Encounter (Signed)
New message      [facility]

## 2014-11-03 NOTE — Telephone Encounter (Signed)
Closed encounter °

## 2014-11-03 NOTE — Discharge Instructions (Signed)
Duplicate prescriptions for the medications prescribed today have been sent to [facility].  PLEASE REMEMBER TO BRING ALL OF YOUR MEDICATIONS TO EACH OF YOUR FOLLOW-UP OFFICE VISITS.  PLEASE ATTEND ALL SCHEDULED FOLLOW-UP APPOINTMENTS.   Activity: Increase activity slowly as tolerated. You may shower, but no soaking baths (or swimming) for 1 week. No driving for 2 days. No lifting over 5 lbs for 1 week. No sexual activity for 1 week.   You May Return to Work: in 1 week (if applicable)  Wound Care: You may wash cath site gently with soap and water. Keep cath site clean and dry. If you notice pain, swelling, bleeding or pus at your cath site, please call [phone].    Cardiac Cath Site Care Refer to this sheet in the next few weeks. These instructions provide you with information on caring for yourself after your procedure. Your caregiver may also give you more specific instructions. Your treatment has been planned according to current medical practices, but problems sometimes occur. Call your caregiver if you have any problems or questions after your procedure. HOME CARE INSTRUCTIONS  You may shower 24 hours after the procedure. Remove the bandage (dressing) and gently wash the site with plain soap and water. Gently pat the site dry.   Do not apply powder or lotion to the site.   Do not sit in a bathtub, swimming pool, or whirlpool for 5 to 7 days.   No bending, squatting, or lifting anything over 10 pounds (4.5 kg) as directed by your caregiver.   Inspect the site at least twice daily.   Do not drive home if you are discharged the same day of the procedure. Have someone else drive you.   You may drive 24 hours after the procedure unless otherwise instructed by your caregiver.  What to expect:  Any bruising will usually fade within 1 to 2 weeks.   Blood that collects in the tissue (hematoma) may be painful to the touch. It should usually decrease in size and tenderness within 1  to 2 weeks.  SEEK IMMEDIATE MEDICAL CARE IF:  You have unusual pain at the site or down the affected limb.   You have redness, warmth, swelling, or pain at the site.   You have drainage (other than a small amount of blood on the dressing).   You have chills.   You have a fever or persistent symptoms for more than 72 hours.   You have a fever and your symptoms suddenly get worse.   Your leg becomes pale, cool, tingly, or numb.   You have heavy bleeding from the site. Hold pressure on the site.  Document Released: 07/27/2010 Document Revised: 06/13/2011 Document Reviewed: 07/27/2010 [facility] Patient Information 2012 [place], [place].

## 2014-11-03 NOTE — Progress Notes (Signed)
CARDIAC REHAB PHASE I   PRE:  Rate/Rhythm: 61 SR with PACs    BP: sitting 156/61    SaO2:   MODE:  Ambulation: 300 ft   POST:  Rate/Rhythm: 74 SR with PACs    BP: sitting 195/56     SaO2:   Tolerated well, sts SOB/angina is much improved. Pt feels weak from hospital/bedrest and also deconditioned from 6 months of angina.  Ed completed. Very knowledgeable of controlling HF. Interested in CRPII in a few weeks. Will send referral to [place]. [phone]  Elissa LovettReeve, Wave Calzada ExeterKristan CES, ACSM 11/03/2014 8:57 AM

## 2014-11-04 ENCOUNTER — Other Ambulatory Visit: Payer: Self-pay | Admitting: Cardiovascular Disease

## 2014-11-04 NOTE — Telephone Encounter (Signed)
Rx(s) sent to pharmacy electronically.  

## 2014-11-04 NOTE — Telephone Encounter (Signed)
Patient contacted regarding discharge from [place] on 11/03/14  Patient understands to follow up with provider Cline Crockkathryn thompson pa on 11/16/14 at 10 am [address] Patient understands discharge instructions? yes Patient understands medications and regiment? yes Patient understands to bring all medications to this visit? yes

## 2014-11-15 NOTE — Progress Notes (Signed)
Cardiology Office Note   Date:  11/16/2014   ID:  Melvin [name], DOB 10-26-1927, MRN [id number]  PCP:  Melvin Ribas, MD  Cardiologist:  Dr. Marya Landry Follow Up.    History of Present Illness: Melvin [name] is a 81 y.o. male with a history of HTN, LBBB, DMT2, thrombocytopenia, mild AS, frequent PVCs, ICM (EF 35-40%) and CAD s/p CABG (1998) with recent stenting with DES to SVG supplying the circumflex marginal vessel (11/02/14) who presents for post hospital follow up.   He was evaluated by Dr. Tresa Endo in the office on 11/03/14 for chest pressure and had a Myoview which was abnormal. He was scheduled for cardiac catheterization and came to the hospital for the procedure on 11/01/2014. He undwerwent [name]/LHC which revealed EF 35-40%, mild AS w/ 2+ AI, LAD, CFX, RCA 100%, LIMA-LAD OK, SVG-D1 OK, SVG-PDA 30%, SVG-OM 95%. Dr. Tresa Endo felt the major problem was a 95% stenosis in his SVG-OM. Because of the dye load, he was held and hydrated, with staged intervention scheduled for 04/27. He had staged PCI to the SVG-OM with a DES on 11/02/14, reducing the stenosis to 0. He tolerated the procedure well. His blood pressure needed to improvement so hydralazine and amlodipine were added to his medication regimen. He had mild thrombocytopenia but was asymptomatic and having no bleeding issues. His renal function was stable.   Since he has left the hospital he has been feeling well. Before his intervention he was not able to walk up the steps without stopping due to chest pressure and SOB. He is now able to do this without difficulties. No CP or SOB. He has chronic 2 pillow orthopnea than has not changed. No PND. Minor LE edema. He is gaining his strength back and feeling better. No blood in his stool or urine.     Past Medical History  Diagnosis Date  . High cholesterol   . Unspecified hereditary and idiopathic peripheral neuropathy 10/01/2012  . RLS (restless legs syndrome)  10/01/2012  . Hypertension   . PAD (peripheral artery disease)   . PVC (premature ventricular contraction) 04/26/1997    on beta blocker/pt had cardio net monitor 06/23/2012  . LBBB (left bundle branch block)   . Heart murmur   . Coronary artery disease   . Type II diabetes mellitus   . History of hiatal hernia   . GERD (gastroesophageal reflux disease)   . Arthritis     "just about all over my body"  . Osteoporosis   . Age-related macular degeneration, dry, both eyes     Past Surgical History  Procedure Laterality Date  . Hiatal hernia repair    . Appendectomy    . Doppler echocardiography  05/18/2012    EF 45-50%, LV systolic function mildly reduced; borderline LA enlargment; mild mitral annular calcif, mild-mod MR; mild TR with normal RVSP; mild calcification of AV leaflets and mild-mod valvular AS with mild regurg      . Doppler echocardiography  08/19/2011    EF 50-55%, mod conc LVH; mild mitral annular calcif with mild MR; mild-mod TR; AV mildly sclerotic with mild valvular AS and mild regurg, mild aortic root dilatation  . Nm myocar perf ejection fraction  09/2011    lexiscan - no reversible ischmai, fixed anteroseptal defect [name]/t LBBB; EF 51%; septal hypokinesis; low risk but abnormal  . Lower extremity arterial doppler  02/2012    RLE - mild to mod arterial insuff; RSFA  50-69% diameter reduction; [name] pop 50-69% diameter reduction; posterior tibial ([name]) demonstrates occlusive disease; LSFA/prox pop 0-49% diameter reduction; L distal pop 50-69% diameter reduction; posterior tibial (L) demonstrates occlusive disease  . Lower extremity angiogram  02/05/2011    Diamondback orbital rotational atherectomy, percutaneous transluminal angioplasty of high-grade calcified popliteal & tibioperoneal stenosis (Dr. Erlene Quan)  . Coronary artery bypass graft  04/1997    CABGx5 - LIMA to LAD, SVG to PDA, SVG to PLA, VG to OM1, VG to 2nd diagonal (Dr. Dorris Fetch)  . Cardiac catheterization  04/26/1997      CAD - 50-60% prox to mid LAD with 70% stenosis in mid-distal, 70-80% 1st diag stenosis, 50-60% ostial stenosis in large 2nd diagonal; diffuse RCA stenosis with 40-50% osital narrowing with 70% prox stenosis (Dr. Bishop Limbo) >> CABG  . Cardiac catheterization  11/01/2014  . Tonsillectomy    . Hernia repair    . Inguinal hernia repair Bilateral   . Cataract extraction w/ intraocular lens  implant, bilateral Bilateral   . Left and right heart catheterization with coronary angiogram N/A 11/01/2014    Procedure: LEFT AND RIGHT HEART CATHETERIZATION WITH CORONARY ANGIOGRAM;  Surgeon: Lennette Bihari, MD; EF35-40%, mild AS w/ 2+ AI, LAD, CFX, RCA 100%, LIMA-LAD OK, SVG-D1 OK, SVG-PDA 30%, SVG-OM 95%  . Percutaneous coronary stent intervention (pci-s) N/A 11/02/2014    Procedure: PERCUTANEOUS CORONARY STENT INTERVENTION (PCI-S);  Surgeon: Lennette Bihari, MD; 3.015 mm Resolute integrity DES to the SVG-OM      Current Outpatient Prescriptions  Medication Sig Dispense Refill  . amLODipine (NORVASC) 10 MG tablet Take 1 tablet (10 mg total) by mouth daily. 30 tablet 3  . aspirin 81 MG tablet Take 81 mg by mouth daily.    Marland Kitchen atenolol (TENORMIN) 100 MG tablet Take 0.5-1 tablets (50-100 mg total) by mouth daily. Take 100 mg every a.m., 50 mg p.m. 150 tablet 3  . atorvastatin (LIPITOR) 40 MG tablet Take 1 tablet (40 mg total) by mouth daily at 6 PM. 30 tablet 3  . Calcium-Vitamin D (CALTRATE 600 PLUS-VIT D PO) Take 1 tablet by mouth daily.     . clopidogrel (PLAVIX) 75 MG tablet Take 1 tablet (75 mg total) by mouth daily. 90 tablet 3  . gabapentin (NEURONTIN) 300 MG capsule Take 1 capsule (300 mg total) by mouth at bedtime. 90 capsule 3  . hydrALAZINE (APRESOLINE) 25 MG tablet Take 1 tablet (25 mg total) by mouth every 8 (eight) hours. 90 tablet 3  . metformin (FORTAMET) 1000 MG (OSM) 24 hr tablet Take 1,000 mg by mouth 2 (two) times daily with a meal.    . Multiple Vitamins-Minerals (PRESERVISION AREDS 2 PO)  Take 1 capsule by mouth 2 (two) times daily.     . multivitamin-iron-minerals-folic acid (CENTRUM) chewable tablet Chew 1 tablet by mouth daily.    . niacin (NIASPAN) 750 MG CR tablet Take 2 tablets (1,500 mg total) by mouth at bedtime. 180 tablet 3  . NITROSTAT 0.4 MG SL tablet Place 0.4 mg under the tongue every 5 (five) minutes as needed for chest pain.     Marland Kitchen oxymetazoline (AFRIN) 0.05 % nasal spray Place 2 sprays into both nostrils 3 (three) times daily as needed for congestion (Allergies).    . polycarbophil (FIBERCON) 625 MG tablet Take 625 mg by mouth daily.    Marland Kitchen torsemide (DEMADEX) 20 MG tablet Take 1 tablet (20 mg total) by mouth 2 (two) times daily. 180 tablet 3  . zoledronic  acid (RECLAST) 5 MG/100ML SOLN Inject 100 mLs (5 mg total) into the vein once. 100 mL 0   No current facility-administered medications for this visit.    Allergies:   Other and Pramipexole    Social History:  The patient  reports that he has never smoked. He has never used smokeless tobacco. He reports that he does not drink alcohol or use illicit drugs.   Family History:  The patient's family history includes CAD in his mother; Heart attack in his father.    ROS:  Please see the history of present illness.   Otherwise, review of systems are positive for none.   All other systems are reviewed and negative.    PHYSICAL EXAM: VS:  BP 142/58 mmHg  Pulse 57  Ht 5' 7.75" (1.721 m)  Wt 180 lb (81.647 kg)  BMI 27.57 kg/m2 , BMI Body mass index is 27.57 kg/(m^2). GEN: Well nourished, well developed, in no acute distress HEENT: normal Neck: no JVD, carotid bruits, or masses Cardiac: RRR; loud SEM., rubs, or gallops, Mild bilateral LE 1+ edema . Femoral groin site stable Respiratory:  clear to auscultation bilaterally, normal work of breathing GI: soft, nontender, nondistended, + BS MS: no deformity or atrophy Skin: warm and dry, no rash Neuro:  Strength and sensation are intact Psych: euthymic mood, full  affect   EKG:  EKG is ordered today. The ekg ordered today demonstrates Sinus brady HR 57. LBBB.   Recent Labs: 09/28/2014: TSH 3.170 10/26/2014: ALT 16 11/03/2014: BUN 9; Creatinine 0.87; Hemoglobin 12.3*; Platelets 127*; Potassium 3.8; Sodium 141    Lipid Panel    Component Value Date/Time   CHOL 135 09/28/2014 0933   TRIG 77 09/28/2014 0933   HDL 48 09/28/2014 0933   LDLCALC 72 09/28/2014 0933      Wt Readings from Last 3 Encounters:  11/16/14 180 lb (81.647 kg)  11/03/14 174 lb 9.7 oz (79.2 kg)  10/26/14 178 lb (80.74 kg)      Other studies Reviewed: Additional studies/ records that were reviewed today include: LHC Review of the above records demonstrates:  -- L/RHC:11/01/2014: EF 35-40%, mild AS w/ 2+ AI, LAD, CFX, RCA 100%, LIMA-LAD OK, SVG-D1 OK, SVG-PDA 30%, SVG-OM 95%.   ASSESSMENT AND PLAN:  Melvin [name] is a 81 y.o. male with a history of HTN, LBBB, DMT2, thrombocytopenia, mild AS, frequent PVCs, ICM (EF 35-40%) and CAD s/p CABG (1998) with recent stenting with DES to SVG supplying the circumflex marginal vessel (11/02/14) who presents for post hospital follow up.   CAD s/p CABG x4V and recent PCI/DES to SVG-OM -- Groin site stable -- Continue ASA/Plavix, statin and BB.  Ischemic CM- EF 35-40% by recent heart cath.  -- No s/s volume overload. Mild LE edema but did not take his torsemide this AM.  -- Was previously on losartan 50mg  BID, but this was discontinued inpatient for unclear reasons. Will defer to Dr. Tresa EndoKelly at follow up. He will continue atenolol 100mg  AM and 50mg  PM. -- Cont torsemide 20mg  BID. Not on potassium. Will check a BMET today  Thrombocytopenia- PLT 127 at discharge. Will get a CBC today  HTN- BP 142/58 today.  Well controlled today. Continue amlodipine 10mg , atenolol 100mg , hydralazine 25mg  TID.   Freq PVCs- continue BB  Aortic stenosis- mild AS w/ 2+ AI by [facility] 11/01/14   Current medicines are reviewed at length with the  patient today.  The patient does not have concerns regarding medicines.  The following  changes have been made:  no change  Labs/ tests ordered today include:   Orders Placed This Encounter  Procedures  . CBC w/Diff  . Basic Metabolic Panel (BMET)  . EKG 12-Lead     Disposition:   FU with Dr. Tresa EndoKelly in 2-3 months  Signed, Melvin [name], Melvin Moraes R, PA-C  11/16/2014 10:24 AM    [facility] [address], [place], [place]  [address] Phone: [phone]; Fax: [phone]

## 2014-11-16 ENCOUNTER — Ambulatory Visit (INDEPENDENT_AMBULATORY_CARE_PROVIDER_SITE_OTHER): Payer: Medicare Other | Admitting: Physician Assistant

## 2014-11-16 ENCOUNTER — Encounter: Payer: Self-pay | Admitting: Physician Assistant

## 2014-11-16 VITALS — BP 142/58 | HR 57 | Ht 67.75 in | Wt 180.0 lb

## 2014-11-16 DIAGNOSIS — I509 Heart failure, unspecified: Secondary | ICD-10-CM

## 2014-11-16 DIAGNOSIS — D696 Thrombocytopenia, unspecified: Secondary | ICD-10-CM | POA: Diagnosis not present

## 2014-11-16 DIAGNOSIS — I25119 Atherosclerotic heart disease of native coronary artery with unspecified angina pectoris: Secondary | ICD-10-CM | POA: Diagnosis not present

## 2014-11-16 LAB — BASIC METABOLIC PANEL
BUN: 21 mg/dL (ref 6–23)
CALCIUM: 10.3 mg/dL (ref 8.4–10.5)
CHLORIDE: 97 meq/L (ref 96–112)
CO2: 29 mEq/L (ref 19–32)
Creatinine, Ser: 0.78 mg/dL (ref 0.40–1.50)
GFR: 100.59 mL/min (ref >60.00)
Glucose, Bld: 90 mg/dL (ref 70–99)
Potassium: 4.2 mEq/L (ref 3.5–5.1)
SODIUM: 133 meq/L — AB (ref 135–145)

## 2014-11-16 LAB — CBC WITH DIFFERENTIAL/PLATELET
BASOS PCT: 1 % (ref 0.0–3.0)
Basophils Absolute: 0.1 10*3/uL (ref 0.0–0.1)
EOS ABS: 0.2 10*3/uL (ref 0.0–0.7)
Eosinophils Relative: 3.2 % (ref 0.0–5.0)
HEMATOCRIT: 34.8 % — AB (ref 39.0–52.0)
Hemoglobin: 11.8 g/dL — ABNORMAL LOW (ref 13.0–17.0)
LYMPHS ABS: 1.4 10*3/uL (ref 0.7–4.0)
Lymphocytes Relative: 23.6 % (ref 12.0–46.0)
MCHC: 33.8 g/dL (ref 30.0–36.0)
MCV: 92.8 fl (ref 78.0–100.0)
Monocytes Absolute: 0.8 10*3/uL (ref 0.1–1.0)
Monocytes Relative: 13.8 % — ABNORMAL HIGH (ref 3.0–12.0)
NEUTROS ABS: 3.4 10*3/uL (ref 1.4–7.7)
NEUTROS PCT: 58.4 % (ref 43.0–77.0)
PLATELETS: 175 10*3/uL (ref 150.0–400.0)
RBC: 3.75 Mil/uL — ABNORMAL LOW (ref 4.22–5.81)
RDW: 14 % (ref 11.5–15.5)
WBC: 5.9 10*3/uL (ref 4.0–10.5)

## 2014-11-16 NOTE — Patient Instructions (Signed)
Medication Instructions:  Your physician recommends that you continue on your current medications as directed. Please refer to the Current Medication list given to you today.   Labwork: BMET CBC  Testing/Procedures: None  Follow-Up: Your physician wants you to follow-up in: 2/3 months with Dr. Carmin RichmondKelly You will receive a reminder letter in the mail two months in advance. If you don't receive a letter, please call our office to schedule the follow-up appointment.   Any Other Special Instructions Will Be Listed Below (If Applicable). None

## 2014-11-24 ENCOUNTER — Telehealth: Payer: Self-pay | Admitting: *Deleted

## 2014-11-24 NOTE — Telephone Encounter (Signed)
-----   Message from Janetta HoraKathryn R Thompson, PA-C sent at 11/16/2014  4:49 PM EDT ----- Please let him know his labs look great and there is no change in plan. Thanks!

## 2015-02-09 ENCOUNTER — Ambulatory Visit (INDEPENDENT_AMBULATORY_CARE_PROVIDER_SITE_OTHER): Payer: Medicare Other | Admitting: Cardiovascular Disease

## 2015-02-09 ENCOUNTER — Encounter: Payer: Self-pay | Admitting: Cardiovascular Disease

## 2015-02-09 VITALS — BP 142/64 | HR 54 | Ht 67.75 in | Wt 180.4 lb

## 2015-02-09 DIAGNOSIS — R002 Palpitations: Secondary | ICD-10-CM | POA: Diagnosis not present

## 2015-02-09 DIAGNOSIS — L989 Disorder of the skin and subcutaneous tissue, unspecified: Secondary | ICD-10-CM

## 2015-02-09 DIAGNOSIS — I200 Unstable angina: Secondary | ICD-10-CM

## 2015-02-09 DIAGNOSIS — I10 Essential (primary) hypertension: Secondary | ICD-10-CM | POA: Diagnosis not present

## 2015-02-09 DIAGNOSIS — I350 Nonrheumatic aortic (valve) stenosis: Secondary | ICD-10-CM

## 2015-02-09 DIAGNOSIS — I447 Left bundle-branch block, unspecified: Secondary | ICD-10-CM

## 2015-02-09 DIAGNOSIS — I25119 Atherosclerotic heart disease of native coronary artery with unspecified angina pectoris: Secondary | ICD-10-CM

## 2015-02-09 NOTE — Patient Instructions (Signed)
Your physician wants you to follow-up in: 4 MONTHS WITH DR Tresa Endo You will receive a reminder letter in the mail two months in advance. If you don't receive a letter, please call our office to schedule the follow-up appointment.   SUPPORT HOSE

## 2015-02-11 ENCOUNTER — Encounter: Payer: Self-pay | Admitting: Cardiovascular Disease

## 2015-02-11 DIAGNOSIS — L989 Disorder of the skin and subcutaneous tissue, unspecified: Secondary | ICD-10-CM | POA: Insufficient documentation

## 2015-02-11 NOTE — Progress Notes (Signed)
Patient ID: Melvin [name], male   DOB: August 19, 1927, 81 y.o.   MRN: [id number]    HPI: Melvin [name] is a 81 y.o. male who presents for a evaluation follow-up of his cardiac catheterization and percutaneous coronary intervention.  [name] has a history of ventricular ectopy and has been on beta blocker therapy since the late 1980s with ectopy suppression. He has CAD and underwent PTCA of his LAD  in 1991. In October 1998 he underwent CABG surgery x5 by Dr. Roxan Hockey (LIMA to LAD, sequential vein to the PDA and PLA, vein to the OM 1, vein to the second diagonal vessel). He has chronic left bundle branch block, and also has been followed for aortic valve stenosis with aortic insufficiency which previously has been in the mild to moderate range. He has peripheral vascular disease and underwent rotational atherectomy of his left popliteal and tibial trunk and angioplasty of focal popliteal lesions. In November 2013 an echo showed an ejection fraction of 29-93% with diastolic dysfunction grade 1. He was felt to have mild to moderate aortic stenosis with mean gradient of 14, maximum gradient of 24, and valve area calculated at 1.2 cm with mild aortic insufficiency.  In December 2013  a CardioNet monitor showed frequent ventricular ectopy without any episodes of ventricular tachycardia but he did have multifocal ectopic complexes. He had been under significant stress in caring for his wife who has has significant health issues. On 06/10/2013 a followup echo Doppler study  showed an ejection fraction of 35-40%. He had abnormal septal motion consistent with dyssynergy due to his bundle branch block. The report was interpreted as mild aortic stenosis and he had a mean gradient of 17 and peak gradient of 29 with a valve area of 1.5 cm. He did have mild to moderate aortic insufficiency. There is mild-to-moderate tricuspid regurgitation left atrium was significantly dilated and he had  mild/moderate tricuspid regurgitation.  A follow-up echo on 11/15/2013 showed an ejection fraction of approximately 35- 40% which was unchanged from 06/2013.  There was mild LVH with mild focal basal hypertrophy of the septum.  Systolic function was moderately reduced.  He was felt to have moderate aortic stenosis with mild AR.  Mean gradient was 7 mm, and peak transvalvular aortic gradient was 31 mm.  However, because of his moderate LV dysfunction, aortic valve area was 1.14 cm, compatible with moderate gas.  There is also mild mitral regurgitation.  He ventricular dyssynergy due to his chronic left bundle branch block.  Estimated PA pressure was 37 mm.   A six-month follow-up echo Doppler study on 05/10/2014 continued to show an ejection fraction of 35-40% with diffuse hypokinesis and septal lateral dyssynchrony consistent with his left bundle branch block.  On this echo he was felt to have only mild aortic stenosis with a mean gradient of 15 mm and a valve area of 1.6 cm.  His aorta had mild dilatation.  PA pressure was normal.  There was moderate left atrial dilatation.  He underwent a lower Doppler study on 04/20/2014 which  showed an ABI of 0.621 on the right.  He has one-vessel runoff of the peroneal artery and the posterior and anterior tibial arteries are known to be occluded.  This was not significantly changed from his prior study.  Earlier this year, he had experienced two  episode of chest pressure which radiated to his shoulders and arms.  He took nitroglycerin with relief.  He had another episode of chest pain several  days later.  I scheduled him for a nuclear perfusion study  on 10/05/2014 which revealed an ejection fraction at 37%.  The study was interpreted as high risk with new lateral wall ischemia.  In addition to his moderate LV dysfunction.  In addition, there is a fixed septal defect likely related to his left bundle branch block.    I performed a right and left heart cardiac  catheterization on 11/01/2014.  He had moderate LV dysfunction with an ejection fraction of 35-40% with anterolateral hypocontractility.  There was mild aortic valve stenosis with 2+ angiographic aortic insufficiency.  There was severe native CAD with total occlusion of the LAD after the septal perforating artery, subtotal occlusion of the circumflex at the ostium with total proximal occlusion, and total occlusion of the RCA beyond the ostium.  He had a pain LIMA graft supplying the LAD.  There was a patent vein graft supplying the diagonal vessel.  He had a 95% focal for a proximal stenosis and a vein graft sequentially supplying his 2 marginal branches of the circumflex vessel.  The vein graft supplying the mid PDA was patent but had 30% narrowing at the anastomosis in a large distal RCA system.  He was hydrated following the procedure, and the following day underwent successful intervention to the saphenous vein graft supplying the circumflex marginal system with the 99% stenosis being reduced to 0% and insertion of a 3.015 mm Resolute integrity DES stent postdilated to 3.25 mm.  Subsequent, [name] has felt significantly improved.  His shortness of breath has improved.  He denies any exertional type chest pain.  He has a history of palpitations but these have been controlled with his long-standing beta blocker therapy.  He does note some mild edema to his feet.  He admits to decreased energy.  He presents for evaluation.  Past Medical History  Diagnosis Date  . High cholesterol   . Unspecified hereditary and idiopathic peripheral neuropathy 10/01/2012  . RLS (restless legs syndrome) 10/01/2012  . Hypertension   . PAD (peripheral artery disease)   . PVC (premature ventricular contraction) 04/26/1997    on beta blocker/pt had cardio net monitor 06/23/2012  . LBBB (left bundle branch block)   . Heart murmur   . Coronary artery disease   . Type II diabetes mellitus   . History of hiatal hernia     . GERD (gastroesophageal reflux disease)   . Arthritis     "just about all over my body"  . Osteoporosis   . Age-related macular degeneration, dry, both eyes     Past Surgical History  Procedure Laterality Date  . Hiatal hernia repair    . Appendectomy    . Doppler echocardiography  05/18/2012    EF 36-62%, LV systolic function mildly reduced; borderline LA enlargment; mild mitral annular calcif, mild-mod MR; mild TR with normal RVSP; mild calcification of AV leaflets and mild-mod valvular AS with mild regurg      . Doppler echocardiography  08/19/2011    EF 50-55%, mod conc LVH; mild mitral annular calcif with mild MR; mild-mod TR; AV mildly sclerotic with mild valvular AS and mild regurg, mild aortic root dilatation  . Nm myocar perf ejection fraction  09/2011    lexiscan - no reversible ischmai, fixed anteroseptal defect r/t LBBB; EF 51%; septal hypokinesis; low risk but abnormal  . Lower extremity arterial doppler  02/2012    RLE - mild to mod arterial insuff; RSFA 50-69% diameter reduction; R pop 50-69%  diameter reduction; posterior tibial (R) demonstrates occlusive disease; LSFA/prox pop 0-49% diameter reduction; L distal pop 50-69% diameter reduction; posterior tibial (L) demonstrates occlusive disease  . Lower extremity angiogram  02/05/2011    Diamondback orbital rotational atherectomy, percutaneous transluminal angioplasty of high-grade calcified popliteal & tibioperoneal stenosis (Dr. Adora Fridge)  . Coronary artery bypass graft  04/1997    CABGx5 - LIMA to LAD, SVG to PDA, SVG to PLA, VG to OM1, VG to 2nd diagonal (Dr. Roxan Hockey)  . Cardiac catheterization  04/26/1997    CAD - 50-60% prox to mid LAD with 70% stenosis in mid-distal, 70-80% 1st diag stenosis, 50-60% ostial stenosis in large 2nd diagonal; diffuse RCA stenosis with 40-50% osital narrowing with 70% prox stenosis (Dr. Corky Downs) >> CABG  . Cardiac catheterization  11/01/2014  . Tonsillectomy    . Hernia repair    .  Inguinal hernia repair Bilateral   . Cataract extraction w/ intraocular lens  implant, bilateral Bilateral   . Left and right heart catheterization with coronary angiogram N/A 11/01/2014    Procedure: LEFT AND RIGHT HEART CATHETERIZATION WITH CORONARY ANGIOGRAM;  Surgeon: Troy Sine, MD; EF35-40%, mild AS w/ 2+ AI, LAD, CFX, RCA 100%, LIMA-LAD OK, SVG-D1 OK, SVG-PDA 30%, SVG-OM 95%  . Percutaneous coronary stent intervention (pci-s) N/A 11/02/2014    Procedure: PERCUTANEOUS CORONARY STENT INTERVENTION (PCI-S);  Surgeon: Troy Sine, MD; 3.015 mm Resolute integrity DES to the SVG-OM     Allergies  Allergen Reactions  . Other     Patient is allergic to something but doesn't know the name.  . Pramipexole     Current Outpatient Prescriptions  Medication Sig Dispense Refill  . amLODipine (NORVASC) 10 MG tablet Take 1 tablet (10 mg total) by mouth daily. 30 tablet 3  . aspirin 81 MG tablet Take 81 mg by mouth daily.    Marland Kitchen atenolol (TENORMIN) 100 MG tablet Take 0.5-1 tablets (50-100 mg total) by mouth daily. Take 100 mg every a.m., 50 mg p.m. 150 tablet 3  . atorvastatin (LIPITOR) 40 MG tablet Take 1 tablet (40 mg total) by mouth daily at 6 PM. 30 tablet 3  . Calcium-Vitamin D (CALTRATE 600 PLUS-VIT D PO) Take 1 tablet by mouth daily.     . clopidogrel (PLAVIX) 75 MG tablet Take 1 tablet (75 mg total) by mouth daily. 90 tablet 3  . gabapentin (NEURONTIN) 300 MG capsule Take 1 capsule (300 mg total) by mouth at bedtime. 90 capsule 3  . hydrALAZINE (APRESOLINE) 25 MG tablet Take 1 tablet (25 mg total) by mouth every 8 (eight) hours. 90 tablet 3  . metformin (FORTAMET) 1000 MG (OSM) 24 hr tablet Take 1,000 mg by mouth 2 (two) times daily with a meal.    . Multiple Vitamins-Minerals (PRESERVISION AREDS 2 PO) Take 1 capsule by mouth 2 (two) times daily.     . multivitamin-iron-minerals-folic acid (CENTRUM) chewable tablet Chew 1 tablet by mouth daily.    . niacin (NIASPAN) 750 MG CR tablet Take  2 tablets (1,500 mg total) by mouth at bedtime. 180 tablet 3  . NITROSTAT 0.4 MG SL tablet Place 0.4 mg under the tongue every 5 (five) minutes as needed for chest pain.     Marland Kitchen oxymetazoline (AFRIN) 0.05 % nasal spray Place 2 sprays into both nostrils 3 (three) times daily as needed for congestion (Allergies).    . polycarbophil (FIBERCON) 625 MG tablet Take 625 mg by mouth daily.    Marland Kitchen torsemide (DEMADEX) 20  MG tablet Take 1 tablet (20 mg total) by mouth 2 (two) times daily. 180 tablet 3  . zoledronic acid (RECLAST) 5 MG/100ML SOLN Inject 100 mLs (5 mg total) into the vein once. 100 mL 0   No current facility-administered medications for this visit.    Socially he is married, retired, and has one child.  He does not routinely exercise. There is no tobacco or alcohol use.  ROS General: Negative; No fevers, chills, or night sweats;  HEENT: Negative; No changes in vision or hearing, sinus congestion, difficulty swallowing Pulmonary: Negative; No cough, wheezing, shortness of breath, hemoptysis Cardiovascular: See HPI;  GI: Negative; No nausea, vomiting, diarrhea, or abdominal pain GU: Negative; No dysuria, hematuria, or difficulty voiding Musculoskeletal: Negative; no myalgias, joint pain, or weakness Hematologic/Oncology: Negative; no easy bruising, bleeding Endocrine: Negative; no heat/cold intolerance; no diabetes Neuro: Negative; no changes in balance, headaches Skin: Negative; No rashes or skin lesions Psychiatric: Negative; No behavioral problems, depression Sleep: Negative; No snoring, daytime sleepiness, hypersomnolence, bruxism, restless legs, hypnogognic hallucinations, no cataplexy Other comprehensive 14 point system review is negative.   PE BP 142/64 mmHg  Pulse 54  Ht 5' 7.75" (1.721 m)  Wt 180 lb 6.4 oz (81.829 kg)  BMI 27.63 kg/m2  Wt Readings from Last 3 Encounters:  02/09/15 180 lb 6.4 oz (81.829 kg)  11/16/14 180 lb (81.647 kg)  11/03/14 174 lb 9.7 oz (79.2 kg)    General: Alert, oriented, no distress.  HEENT: Normocephalic, atraumatic. Pupils round and reactive; sclera anicteric; fungating type lesion on the top of his head which has grown in size. Nose without nasal septal hypertrophy Mouth/Parynx benign; Mallinpatti scale  2 Neck: No JVD, no carotid bruits with transmitted murmur Chest: No tenderness to palpation Lungs: clear to ausculatation and percussion; no wheezing or rales Heart: RRR, s1 s2 normal 2/6 , 2/6 harsh systolic murmur in the aortic area and 1/ 6 diastolic murmur compatible with AS/AR. No rubs thrills or heaves. Abdomen: soft, nontender; no hepatosplenomehaly, BS+; abdominal aorta nontender and not dilated by palpation. Back: No CVA Pulses 2+ Extremities: Trace to 1+ right foot edema.., Homan's sign negative  Neurologic: grossly nonfocal Psychologic: Normal affect and mood  ECG (independently read by me): Sinus bradycardia 54 bpm with mild sinus arrhythmia.  Left bundle branch block with repolarization changes.   March 2016 ECG (independently read by me): Sinus bradycardia 54 bpm.  Left bundle branch block with repolarization changes.  QTc interval 470 ms.  November 2015 ECG (independently read by me): Sinus bradycardia 55 bpm with left bundle branch block and repolarization changes  ECG (independently read by me): Sinus bradycardia 57 beats per minute.  Left bundle branch block with repolarization changes.  PR interval is now normal at 162 ms  Prior February 2015 ECG (independently read by me): Sinus bradycardia 58 beats per minute. Left bundle branch block. Borderline first-degree block with a PR interval 208 ms the  Prior ECG of 06/22/2013:  Sinus rhythm at 63 beats per minute with resolution of prior first degree heart block. Left bundle branch block with repolarization changes.  LABS:  BMP Latest Ref Rng 11/16/2014 11/03/2014 11/02/2014  Glucose 70 - 99 mg/dL 90 128(H) 140(H)  BUN 6 - 23 mg/dL $Remove'21 9 13  'yzRvjMB$ Creatinine 0.40  - 1.50 mg/dL 0.78 0.87 0.86  BUN/Creat Ratio 10 - 22 - - -  Sodium 135 - 145 mEq/L 133(L) 141 139  Potassium 3.5 - 5.1 mEq/L 4.2 3.8 3.8  Chloride 96 -  112 mEq/L 97 107 102  CO2 19 - 32 mEq/L $Remove'29 25 28  'UTjAEBv$ Calcium 8.4 - 10.5 mg/dL 10.3 8.8 8.9    Hepatic Function Latest Ref Rng 10/26/2014 09/28/2014 09/16/2012  Total Protein 6.0 - 8.3 g/dL 7.0 6.3 6.4  Albumin 3.5 - 5.2 g/dL 4.6 - 4.3  AST 0 - 37 U/L $Remo'21 19 18  'DHkMt$ ALT 0 - 53 U/L $Remo'16 10 13  'DAFbJ$ Alk Phosphatase 39 - 117 U/L 55 55 46  Total Bilirubin 0.2 - 1.2 mg/dL 0.6 0.5 0.6  Bilirubin, Direct 0.0 - 0.3 mg/dL - - 0.1    CBC Latest Ref Rng 11/16/2014 11/03/2014 11/02/2014  WBC 4.0 - 10.5 K/uL 5.9 7.5 8.2  Hemoglobin 13.0 - 17.0 g/dL 11.8(L) 12.3(L) 12.6(L)  Hematocrit 39.0 - 52.0 % 34.8(L) 38.0(L) 38.3(L)  Platelets 150.0 - 400.0 K/uL 175.0 127(L) 131(L)   Lab Results  Component Value Date   TSH 3.170 09/28/2014    Lipid Panel     Component Value Date/Time   CHOL 135 09/28/2014 0933   TRIG 77 09/28/2014 0933   HDL 48 09/28/2014 0933   LDLCALC 72 09/28/2014 0933      RADIOLOGY: No results found.    ASSESSMENT AND PLAN:   Mr. Trupiano is an 81 year old white male who is 18 years status post CABG surgery.   A  nuclear perfusion study in March 2013 revealed fairly normal perfusion with the exception of a mild anteroseptal defect most likely related to his left bundle branch block.  Early this year he had developed recurrent chest pain and increasing shortness of breath symptomatology.  His last recent nuclear study was changed and demonstrated new lateral wall ischemia.  He has been demonstrated to have mild-to-moderate aortic stenosis with reduced LV function with an ejection fraction of 35-40% which has remained stable over the past several years.  He has stable but chronic claudication symptoms and has documented one vessel runoff via the peroneal artery of his right lower extremity.  Cardiac catheterization was performed and was found to  have 95% stenosis in the sequential vein graft supplying 2 marginal vessels which accounts for his lateral ischemia seen as most recent study.  He is felt significantly improved following successful intervention with insertion of a 3.015 mm Resolute DES stent postdilated to 3.25 mm.  Shortness of breath has improved.  Right heart catheterization demonstrated only very mild pelvic hypertension with a PA pressure 32/17.  He and mild aortic stenosis with a peak aortic gradient of 7, and the mean aortic valve gradient of 10.  His aortic valve area is 1.9 cm.  His ECG today is stable with sinus bradycardia.  He is on atenolol 100 mg in the morning and 50 mg at night.  His blood pressure today is controlled.  Also on amlodipine 10 mg, hydralazine 25 mg every 8 hours and he continues to take torsemide for his peripheral edema 20 mg twice a day.  He is diabetic and continues to be on metformin.  He is on dual antiplatelets therapy with aspirin and Plavix and denies any bleeding.  Clinically he is improved.  I suggested support stockings for his residual right foot edema.  I will's.  He does have a significant lesion on his scalp and I strongly urged follow-up.  Dermatologic evaluation.  In the past he had been evaluated by Dr. Nevada Crane who had removed a previous scalp lesion.  I will see him in 4 months for cardiology follow-up evaluation or sooner if problems  arise.  Time spent: 25 minutes   Troy Sine, MD, [facility] [place]  02/11/2015 11:48 PM

## 2015-02-22 ENCOUNTER — Ambulatory Visit (INDEPENDENT_AMBULATORY_CARE_PROVIDER_SITE_OTHER): Payer: Medicare Other | Admitting: Ophthalmology

## 2015-02-22 DIAGNOSIS — H3531 Nonexudative age-related macular degeneration: Secondary | ICD-10-CM

## 2015-02-22 DIAGNOSIS — E11329 Type 2 diabetes mellitus with mild nonproliferative diabetic retinopathy without macular edema: Secondary | ICD-10-CM

## 2015-02-22 DIAGNOSIS — H43813 Vitreous degeneration, bilateral: Secondary | ICD-10-CM

## 2015-02-22 DIAGNOSIS — H35373 Puckering of macula, bilateral: Secondary | ICD-10-CM

## 2015-02-22 DIAGNOSIS — I10 Essential (primary) hypertension: Secondary | ICD-10-CM | POA: Diagnosis not present

## 2015-02-22 DIAGNOSIS — E11319 Type 2 diabetes mellitus with unspecified diabetic retinopathy without macular edema: Secondary | ICD-10-CM | POA: Diagnosis not present

## 2015-02-22 DIAGNOSIS — H35033 Hypertensive retinopathy, bilateral: Secondary | ICD-10-CM | POA: Diagnosis not present

## 2015-03-07 ENCOUNTER — Other Ambulatory Visit: Payer: Self-pay | Admitting: Cardiovascular Disease

## 2015-03-07 DIAGNOSIS — I739 Peripheral vascular disease, unspecified: Secondary | ICD-10-CM

## 2015-03-08 ENCOUNTER — Other Ambulatory Visit: Payer: Self-pay | Admitting: Cardiovascular Disease

## 2015-03-08 DIAGNOSIS — I739 Peripheral vascular disease, unspecified: Secondary | ICD-10-CM

## 2015-03-15 ENCOUNTER — Other Ambulatory Visit: Payer: Self-pay | Admitting: Cardiovascular Disease

## 2015-03-15 ENCOUNTER — Ambulatory Visit (HOSPITAL_COMMUNITY)
Admission: RE | Admit: 2015-03-15 | Discharge: 2015-03-15 | Disposition: A | Discharge status: Home / Self Care | Payer: Medicare Other | Source: Ambulatory Visit | Attending: Cardiovascular Disease | Admitting: Cardiovascular Disease

## 2015-03-15 DIAGNOSIS — I739 Peripheral vascular disease, unspecified: Secondary | ICD-10-CM | POA: Diagnosis not present

## 2015-03-15 DIAGNOSIS — E785 Hyperlipidemia, unspecified: Secondary | ICD-10-CM | POA: Diagnosis not present

## 2015-03-15 DIAGNOSIS — Z9862 Peripheral vascular angioplasty status: Secondary | ICD-10-CM | POA: Diagnosis not present

## 2015-03-15 DIAGNOSIS — E119 Type 2 diabetes mellitus without complications: Secondary | ICD-10-CM | POA: Insufficient documentation

## 2015-03-15 DIAGNOSIS — Z951 Presence of aortocoronary bypass graft: Secondary | ICD-10-CM | POA: Diagnosis not present

## 2015-03-15 DIAGNOSIS — I10 Essential (primary) hypertension: Secondary | ICD-10-CM | POA: Diagnosis not present

## 2015-03-15 DIAGNOSIS — I2510 Atherosclerotic heart disease of native coronary artery without angina pectoris: Secondary | ICD-10-CM | POA: Insufficient documentation

## 2015-03-25 ENCOUNTER — Other Ambulatory Visit: Payer: Self-pay | Admitting: Cardiovascular Disease

## 2015-03-27 NOTE — Telephone Encounter (Signed)
Rx request sent to pharmacy.  

## 2015-04-12 ENCOUNTER — Telehealth: Payer: Self-pay | Admitting: Cardiovascular Disease

## 2015-04-12 NOTE — Progress Notes (Signed)
Dr. Tresa Endo asked for you to review and make recommendations on this study please.

## 2015-04-17 NOTE — Telephone Encounter (Signed)
Close encounter 

## 2015-04-27 ENCOUNTER — Encounter: Payer: Self-pay | Admitting: Neurology

## 2015-04-27 ENCOUNTER — Ambulatory Visit (INDEPENDENT_AMBULATORY_CARE_PROVIDER_SITE_OTHER): Payer: Medicare Other | Admitting: Neurology

## 2015-04-27 VITALS — BP 136/60 | HR 60 | Resp 16 | Ht 67.75 in | Wt 178.0 lb

## 2015-04-27 DIAGNOSIS — I200 Unstable angina: Secondary | ICD-10-CM

## 2015-04-27 DIAGNOSIS — G2581 Restless legs syndrome: Secondary | ICD-10-CM | POA: Diagnosis not present

## 2015-04-27 DIAGNOSIS — R609 Edema, unspecified: Secondary | ICD-10-CM | POA: Diagnosis not present

## 2015-04-27 NOTE — Progress Notes (Signed)
Subjective:    Patient ID: Melvin [name] is a 81 y.o. male.  HPI     Interim history:   Melvin [name] is a very pleasant 81 year old right-handed gentleman with an underlying complex medical history of type 2 diabetes, osteoporosis, lower extremity edema, left bundle branch block, heart palpitations, aortic stenosis, hypertension, and hyperlipidemia, CAD, s/p CABG in 10/98, who presents for followup consultation of his left leg pain, possible atypical RLS in conjunction with neuropathy. He is accompanied by his wife today. I last saw him on 10/26/2014, at which time he reported recent chest pains. He had an echocardiogram on 05/10/14, which I reviewed: Normal LV size with mild LV hypertrophy. EF 35-40% with diffuse hypokinesis, septal-lateral dyssynchrony. Mild aortic stenosis, mild aortic insufficiency. Normal RV size with mildly decreased systolic function. He had b/l LE arterial doppler on 04/20/14, which I reviewed as well. He has PVD. He has LE swelling and is on a diuretic. As far as the leg pains, he felt fair. He felt the gabapentin was helpful. He felt it was not as the horizant, but at least affordable.   He was supposed to have a chest x-ray as well as a left heart catheterization. I suggested we continue with gabapentin 300 mg each night. I felt his lower extremity edema was a little worse.  Today, 04/27/2015: He reports doing fairly well. RLS and pain under reasonable control, swelling of the LEs stable. Cannot walk as much. He had a LHC on 11/01/2014: moderate LV dysfunction with an ejection fraction of 35-40% with anterolateral hypocontractility.  There was mild aortic valve stenosis with 2+ angiographic aortic insufficiency.  There was severe native CAD with total occlusion of the LAD after the septal perforating artery, subtotal occlusion of the circumflex at the ostium with total proximal occlusion, and total occlusion of the RCA beyond the ostium.  He had a pain LIMA graft  supplying the LAD.  There was a patent vein graft supplying the diagonal vessel.  He had a 95% focal for a proximal stenosis and a vein graft sequentially supplying his 2 marginal branches of the circumflex vessel.  The vein graft supplying the mid PDA was patent but had 30% narrowing at the anastomosis in a large distal RCA system.  Previously:   I saw him on 03/15/2014, at which time he reported feeling fairly stable. However, he was taking the Horizant every other day due to cost issues. The Horizant had also helped his neck pain he felt. He had had some improvement in his symptoms with dopamine agonists including Requip and Mirapex but could not tolerate them longer-term. I felt his neuropathy was a little worse. I suggested we try generic gabapentin 300 mg qHS in lieu of the Horizant.   I saw him on 09/09/2013, at which time he reported feeling fairly stable, taking Horizant qod, to help it last longer d/t cost. He was able to drive to the appointment without having to stop to walk around. He had an echo in 12/14, which showed EF of 35 to 40%. He was advised to continue with the current management. He was recently seen by his cardiologist on 01/24/2014, and I reviewed the notes by Dr. Claiborne Billings. He had his most recent echocardiogram in May 2015 which showed an EF of 35-40%, similar to the one in December 2014. He has a history of moderate aortic stenosis. His lipid profile has been fine.  I saw him on 03/09/2013, at which time I felt his leg pain was  do to a combination of peripheral neuropathy, peripheral vascular disease, chronic edema, and perhaps atypical RLS. He was unfortunately not able to tolerate Requip or Mirapex but had improvement in his pain with both dopamine agonists. Horizant at 300 mg has been somewhat helpful but he had mild morning grogginess.   I last saw him on 12/03/12, at which time he reported side effects from Mirapex even though it had actually helped his left leg pain, he had to  stop it. On 09/01/2012 he reported side effects with a Requip, which made him groggy and he had to stop it. It had helped his leg pain, though.   I first met him on 07/21/2012 at which time a suggested blood work and a trial of Requip for the possibility of atypical RLS affecting only his left leg. He also has lower back pain and radiating pain. MRI lumbar spine in November 2012 did not show any structural lesions. He has had kidney cysts. 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 He has tried Neurontin. He has undergone epidural injections in 2013. At our last visit in 5/14 I suggested a trial of Horizant. He reported improvement in his night time pain and better sleep at night, but some daytime somnolence. He also worried about the cost of the medicine of over $120 per month. He had noted no other side effects.   His Past Medical History Is Significant For: Past Medical History  Diagnosis Date  . High cholesterol   . Unspecified hereditary and idiopathic peripheral neuropathy 10/01/2012  . RLS (restless legs syndrome) 10/01/2012  . Hypertension   . PAD (peripheral artery disease) ([place])   . PVC (premature ventricular contraction) 04/26/1997    on beta blocker/pt had cardio net monitor 06/23/2012  . LBBB (left bundle branch block)   . Heart murmur   . Coronary artery disease   . Type II diabetes mellitus ([place])   . History of hiatal hernia   . GERD (gastroesophageal reflux disease)   . Arthritis     "just about all over my body"  . Osteoporosis   . Age-related macular degeneration, dry, both eyes     His Past Surgical History Is Significant For: Past Surgical History  Procedure Laterality Date  . Hiatal hernia repair    . Appendectomy    . Doppler echocardiography  05/18/2012    EF 38-18%, LV systolic function mildly reduced; borderline LA enlargment; mild mitral annular calcif,  mild-mod MR; mild TR with normal RVSP; mild calcification of AV leaflets and mild-mod valvular AS with mild regurg      . Doppler echocardiography  08/19/2011    EF 50-55%, mod conc LVH; mild mitral annular calcif with mild MR; mild-mod TR; AV mildly sclerotic with mild valvular AS and mild regurg, mild aortic root dilatation  . Nm myocar perf ejection fraction  09/2011    lexiscan - no reversible ischmai, fixed anteroseptal defect r/t LBBB; EF 51%; septal hypokinesis; low risk but abnormal  . Lower extremity arterial doppler  02/2012    RLE - mild to mod arterial insuff; RSFA 50-69% diameter reduction; R pop 50-69% diameter reduction; posterior tibial (R) demonstrates occlusive disease; LSFA/prox pop 0-49% diameter reduction; L distal pop 50-69% diameter reduction; posterior tibial (L) demonstrates occlusive disease  . Lower extremity angiogram  02/05/2011    Diamondback orbital rotational atherectomy, percutaneous transluminal angioplasty of high-grade calcified popliteal & tibioperoneal stenosis (  Dr. Adora Fridge)  . Coronary artery bypass graft  04/1997    CABGx5 - LIMA to LAD, SVG to PDA, SVG to PLA, VG to OM1, VG to 2nd diagonal (Dr. Roxan Hockey)  . Cardiac catheterization  04/26/1997    CAD - 50-60% prox to mid LAD with 70% stenosis in mid-distal, 70-80% 1st diag stenosis, 50-60% ostial stenosis in large 2nd diagonal; diffuse RCA stenosis with 40-50% osital narrowing with 70% prox stenosis (Dr. Corky Downs) >> CABG  . Cardiac catheterization  11/01/2014  . Tonsillectomy    . Hernia repair    . Inguinal hernia repair Bilateral   . Cataract extraction w/ intraocular lens  implant, bilateral Bilateral   . Left and right heart catheterization with coronary angiogram N/A 11/01/2014    Procedure: LEFT AND RIGHT HEART CATHETERIZATION WITH CORONARY ANGIOGRAM;  Surgeon: Troy Sine, MD; EF35-40%, mild AS w/ 2+ AI, LAD, CFX, RCA 100%, LIMA-LAD OK, SVG-D1 OK, SVG-PDA 30%, SVG-OM 95%  . Percutaneous coronary  stent intervention (pci-s) N/A 11/02/2014    Procedure: PERCUTANEOUS CORONARY STENT INTERVENTION (PCI-S);  Surgeon: Troy Sine, MD; 3.015 mm Resolute integrity DES to the SVG-OM     His Family History Is Significant For: Family History  Problem Relation Age of Onset  . CAD Mother   . Heart attack Father     His Social History Is Significant For: Social History   Social History  . Marital Status: Married    Spouse Name: Judson Roch  . Number of Children: 1  . Years of Education: College   Occupational History  .     Social History Main Topics  . Smoking status: Never Smoker   . Smokeless tobacco: Never Used  . Alcohol Use: No  . Drug Use: No  . Sexual Activity: No   Other Topics Concern  . None   Social History Narrative   Pt lives at home with his spouse.   Has been married 20yr.   Caffeine UIRS:WNIOEV is right handed    His Allergies Are:  Allergies  Allergen Reactions  . Other     Patient is allergic to something but doesn't know the name.  . Pramipexole   :   His Current Medications Are:  Outpatient Encounter Prescriptions as of 04/27/2015  Medication Sig  . amLODipine (NORVASC) 10 MG tablet Take 1 tablet (10 mg total) by mouth daily.  .Marland Kitchenaspirin 81 MG tablet Take 81 mg by mouth daily.  .Marland Kitchenatenolol (TENORMIN) 100 MG tablet Take 0.5-1 tablets (50-100 mg total) by mouth daily. Take 100 mg every a.m., 50 mg p.m.  .Marland Kitchenatorvastatin (LIPITOR) 40 MG tablet Take 1 tablet (40 mg total) by mouth daily at 6 PM.  . Calcium-Vitamin D (CALTRATE 600 PLUS-VIT D PO) Take 1 tablet by mouth daily.   . clopidogrel (PLAVIX) 75 MG tablet Take 1 tablet (75 mg total) by mouth daily.  .Marland Kitchengabapentin (NEURONTIN) 300 MG capsule Take 1 capsule (300 mg total) by mouth at bedtime.  . hydrALAZINE (APRESOLINE) 25 MG tablet Take 1 tablet (25 mg total) by mouth every 8 (eight) hours.  . metformin (FORTAMET) 1000 MG (OSM) 24 hr tablet Take 1,000 mg by mouth 2 (two) times daily with a meal.  .  Multiple Vitamins-Minerals (PRESERVISION AREDS 2 PO) Take 1 capsule by mouth 2 (two) times daily.   . multivitamin-iron-minerals-folic acid (CENTRUM) chewable tablet Chew 1 tablet by mouth daily.  . niacin (NIASPAN) 750 MG CR tablet Take 2 tablets (1,500 mg total)  by mouth at bedtime.  Marland Kitchen NITROSTAT 0.4 MG SL tablet Place 0.4 mg under the tongue every 5 (five) minutes as needed for chest pain.   Marland Kitchen oxymetazoline (AFRIN) 0.05 % nasal spray Place 2 sprays into both nostrils 3 (three) times daily as needed for congestion (Allergies).  . polycarbophil (FIBERCON) 625 MG tablet Take 625 mg by mouth daily.  Marland Kitchen torsemide (DEMADEX) 20 MG tablet TAKE 1 TABLET TWICE A DAY  . zoledronic acid (RECLAST) 5 MG/100ML SOLN Inject 100 mLs (5 mg total) into the vein once.   No facility-administered encounter medications on file as of 04/27/2015.  :  Review of Systems:  Out of a complete 14 point review of systems, all are reviewed and negative with the exception of these symptoms as listed below:   Review of Systems  Neurological:       Patient reports that he does not have any new concerns. He still has trouble with RLS but states that it is much better.     Objective:  Neurologic Exam  Physical Exam Physical Examination:   Filed Vitals:   04/27/15 1131  BP: 136/60  Pulse: 60  Resp: 16   General Examination: The patient is a very pleasant 81 y.o. male in no acute distress.   HEENT exam: Normocephalic, atraumatic, face is symmetric with normal facial animation noted. Neck is supple with full range of motion. Hearing is grossly intact. Pupils are equal, round and reactive to light and accommodation. Funduscopy is difficult b/l. Extraocular tracking is good without nystagmus. Speech is clear. Oropharynx is clear. He has moderate mouth dryness. He has normal airway findings. Tongue protrudes centrally and palate elevates symmetrically. There is no lip, neck or jaw tremor.   Chest is clear to auscultation,  with no wheezing, crackles or rhonchi noted.  Heart sounds are normal with the exception of a 2/3-5/3 systolic murmur audible, unchanged.  Abdomen soft, nontender with positive bowel sounds.  Skin examination reveals skin to be warm and dry. There are hyperpigmentations distal lower extremities and scars from prior vein harvesting.  he also has sun exposure related changes on his scalp. He has hyperkeratosis on his nose.  Extremities: He has 1 to 2+ pitting edema in the distal lower extremities up to upper shin areas bilaterally, stable.   Neurologically: Mental status: The patient is awake, alert and oriented in all 3 spheres. His memory, attention, language and knowledge are appropriate. Cranial nerves are as described under HEENT exam. Motor-wise he has normal bulk strength and tone throughout with the exception of 4+ out of 5 weakness in his left hip flexor, unchanged. Reflexes are 1+ in the upper extremities trace in both knees and absent in both ankles, unchanged. Sensory exam reveals decreased sensation to vibration, pinprick and temperature sense in both distal lower extremities left worse than right, b/l up to mid shin areas. He has normal sensation in his hands and face. He has no drift and no tremor. Romberg testing reveals mild swaying but no corrective steps. His gait, station and balance are unremarkable for age. He does walk cautiously and turns slowly, he has no walking aid.    Assessment and Plan:   In summary, Melvin [name] is a 81 yo gentleman with an underlying complex medical history of type 2 diabetes, osteoporosis, lower extremity edema, left bundle branch block, heart palpitations, aortic stenosis, hypertension, and hyperlipidemia, CAD, s/p CABG in 10/98, who presents for followup consultation of his approximately 4 year history of leg pain, L  more than R, which is, most likely due to multiple issues: peripheral neuropathy, PVD, chronic leg edema and RLS. He was not able to tolerate  Requip or Mirapex, and while he had improvement in his pain with both DAs, he had some SEs and while Horizant was helpful, he could not afford it and took it qod for a while, after which we switched to generic gabapentin 300 mg each night in lieu of Horizant. Gabapentin has been helpful. He has had issues in the last 6+ months with CP and SOB and had a LHC with follow up with cardiology pending soon. I suggested we continue with the gabapentin at the current dose of 300 mg each night. Thankfully he has been able to tolerate it and it is somewhat helpful. He is advised to use his cane for safety. His exam is stable. His lower extremity edema seems stable.  I would like to see him back in 6 months from now, sooner if the need arises and asked him to call us with any interim questions, concerns, problems, updates or refill requests. He and his wife were in agreement. I spent 20 minutes in total face-to-face time with the patient, more than 50% of which was spent in counseling and coordination of care, reviewing test results, reviewing medication and discussing or reviewing the diagnosis of leg pain, RLS, the prognosis and treatment options.

## 2015-04-27 NOTE — Patient Instructions (Signed)
Your exam is stable. We will continue with the gabapentin at the current dose, your prescription is still good.   Drink enough water.   Consider using a cane for safety.

## 2015-06-15 ENCOUNTER — Ambulatory Visit (INDEPENDENT_AMBULATORY_CARE_PROVIDER_SITE_OTHER): Payer: Medicare Other | Admitting: Cardiovascular Disease

## 2015-06-15 VITALS — BP 112/58 | HR 53 | Ht 67.75 in | Wt 177.6 lb

## 2015-06-15 DIAGNOSIS — L989 Disorder of the skin and subcutaneous tissue, unspecified: Secondary | ICD-10-CM

## 2015-06-15 DIAGNOSIS — I447 Left bundle-branch block, unspecified: Secondary | ICD-10-CM

## 2015-06-15 DIAGNOSIS — I2510 Atherosclerotic heart disease of native coronary artery without angina pectoris: Secondary | ICD-10-CM

## 2015-06-15 DIAGNOSIS — I10 Essential (primary) hypertension: Secondary | ICD-10-CM

## 2015-06-15 DIAGNOSIS — I350 Nonrheumatic aortic (valve) stenosis: Secondary | ICD-10-CM

## 2015-06-15 DIAGNOSIS — R002 Palpitations: Secondary | ICD-10-CM

## 2015-06-15 DIAGNOSIS — I200 Unstable angina: Secondary | ICD-10-CM

## 2015-06-15 DIAGNOSIS — R600 Localized edema: Secondary | ICD-10-CM | POA: Diagnosis not present

## 2015-06-15 DIAGNOSIS — I2583 Coronary atherosclerosis due to lipid rich plaque: Secondary | ICD-10-CM

## 2015-06-15 MED ORDER — ATENOLOL 100 MG PO TABS: 100.0000 mg | ORAL_TABLET | Freq: Two times a day (BID) | ORAL | Status: DC

## 2015-06-15 NOTE — Patient Instructions (Signed)
Your physician wants you to follow-up in: 6 months or sooner if needed. You will receive a reminder letter in the mail two months in advance. If you don't receive a letter, please call our office to schedule the follow-up appointment.   If you need a refill on your cardiac medications before your next appointment, please call your pharmacy. 

## 2015-06-17 ENCOUNTER — Encounter: Payer: Self-pay | Admitting: Cardiovascular Disease

## 2015-06-17 DIAGNOSIS — R002 Palpitations: Secondary | ICD-10-CM | POA: Insufficient documentation

## 2015-06-17 NOTE — Progress Notes (Signed)
Patient ID: Melvin [name], male   DOB: Jan 20, 1928, 81 y.o.   MRN: [id number]    HPI: Melvin [name] is a 81 y.o. male who presents for a 4 month follow-up cardiology evaluation.  [name] has a history of ventricular ectopy and has been on beta blocker therapy since the late 1980s with ectopy suppression. He has CAD and underwent PTCA of his LAD  in 1991. In October 1998 he underwent CABG surgery x5 by Dr. Roxan Hockey (LIMA to LAD, sequential vein to the PDA and PLA, vein to the OM 1, vein to the second diagonal vessel). He has chronic left bundle branch block, and also has been followed for aortic valve stenosis with aortic insufficiency which previously has been in the mild to moderate range. He has peripheral vascular disease and underwent rotational atherectomy of his left popliteal and tibial trunk and angioplasty of focal popliteal lesions. In November 2013 an echo showed an ejection fraction of 69-79% with diastolic dysfunction grade 1. He was felt to have mild to moderate aortic stenosis with mean gradient of 14, maximum gradient of 24, and valve area calculated at 1.2 cm with mild aortic insufficiency.  In December 2013  a CardioNet monitor showed frequent ventricular ectopy without any episodes of ventricular tachycardia but he did have multifocal ectopic complexes. He had been under significant stress in caring for his wife who has has significant health issues. On 06/10/2013 a followup echo Doppler study  showed an ejection fraction of 35-40%. He had abnormal septal motion consistent with dyssynergy due to his bundle branch block. The report was interpreted as mild aortic stenosis and he had a mean gradient of 17 and peak gradient of 29 with a valve area of 1.5 cm. He did have mild to moderate aortic insufficiency. There is mild-to-moderate tricuspid regurgitation left atrium was significantly dilated and he had mild/moderate tricuspid regurgitation.  A follow-up echo on  11/15/2013 showed an ejection fraction of approximately 35- 40% which was unchanged from 06/2013.  There was mild LVH with mild focal basal hypertrophy of the septum.  Systolic function was moderately reduced.  He was felt to have moderate aortic stenosis with mild AR.  Mean gradient was 7 mm, and peak transvalvular aortic gradient was 31 mm.  However, because of his moderate LV dysfunction, aortic valve area was 1.14 cm, compatible with moderate gas.  There is also mild mitral regurgitation.  He ventricular dyssynergy due to his chronic left bundle branch block.  Estimated PA pressure was 37 mm.   A six-month follow-up echo Doppler study on 05/10/2014 continued to show an ejection fraction of 35-40% with diffuse hypokinesis and septal lateral dyssynchrony consistent with his left bundle branch block.  On this echo he was felt to have only mild aortic stenosis with a mean gradient of 15 mm and a valve area of 1.6 cm.  His aorta had mild dilatation.  PA pressure was normal.  There was moderate left atrial dilatation.  He underwent a lower Doppler study on 04/20/2014 which  showed an ABI of 0.621 on the right.  He has one-vessel runoff of the peroneal artery and the posterior and anterior tibial arteries are known to be occluded.  This was not significantly changed from his prior study.  Earlier this year, he had experienced two  episode of chest pressure which radiated to his shoulders and arms.  He took nitroglycerin with relief.  He had another episode of chest pain several days later.  I scheduled  him for a nuclear perfusion study  on 10/05/2014 which revealed an ejection fraction at 37%.  The study was interpreted as high risk with new lateral wall ischemia.  In addition to his moderate LV dysfunction.  In addition, there is a fixed septal defect likely related to his left bundle branch block.    I performed a right and left heart cardiac catheterization on 11/01/2014.  He had moderate LV dysfunction with  an ejection fraction of 35-40% with anterolateral hypocontractility.  There was mild aortic valve stenosis with 2+ angiographic aortic insufficiency.  There was severe native CAD with total occlusion of the LAD after the septal perforating artery, subtotal occlusion of the circumflex at the ostium with total proximal occlusion, and total occlusion of the RCA beyond the ostium.  He had a pain LIMA graft supplying the LAD.  There was a patent vein graft supplying the diagonal vessel.  He had a 95% focal for a proximal stenosis and a vein graft sequentially supplying his 2 marginal branches of the circumflex vessel.  The vein graft supplying the mid PDA was patent but had 30% narrowing at the anastomosis in a large distal RCA system.  He was hydrated following the procedure, and the following day underwent successful intervention to the saphenous vein graft supplying the circumflex marginal system with the 99% stenosis being reduced to 0% and insertion of a 3.015 mm Resolute integrity DES stent postdilated to 3.25 mm.  When I saw him in follow-up of his catheterization, he was feeling significantly significantly improved.  His shortness of breath improved and he denied any exertional type chest pain. I was also concerned about progressive dermatologic lesions, particularly on his scalp.  I strongly urged that he follow-up with Dr. Nevada Crane.  We had seen remotely for dermatologic evaluation.  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  He continues to experience occasional episodes of palpitations and for the past 2 nights to an extra atenolol 50 mg with resolution of symptoms.  He presents now for evaluation.  Past Medical History  Diagnosis Date  . High cholesterol   . Unspecified hereditary and idiopathic peripheral neuropathy 10/01/2012  . RLS (restless legs syndrome) 10/01/2012  . Hypertension   . PAD (peripheral artery disease)  ([place])   . PVC (premature ventricular contraction) 04/26/1997    on beta blocker/pt had cardio net monitor 06/23/2012  . LBBB (left bundle branch block)   . Heart murmur   . Coronary artery disease   . Type II diabetes mellitus ([place])   . History of hiatal hernia   . GERD (gastroesophageal reflux disease)   . Arthritis     "just about all over my body"  . Osteoporosis   . Age-related macular degeneration, dry, both eyes     Past Surgical History  Procedure Laterality Date  . Hiatal hernia repair    . Appendectomy    . Doppler echocardiography  05/18/2012    EF 63-89%, LV systolic function mildly reduced; borderline LA enlargment; mild mitral annular calcif, mild-mod MR; mild TR with normal RVSP; mild calcification of AV leaflets and mild-mod valvular AS with mild regurg      . Doppler echocardiography  08/19/2011    EF 50-55%, mod conc LVH; mild mitral annular calcif with mild MR; mild-mod TR; AV mildly sclerotic with mild valvular AS and mild regurg, mild aortic root dilatation  . Nm myocar perf ejection fraction  09/2011    lexiscan - no reversible ischmai, fixed anteroseptal defect r/t LBBB; EF 51%; septal hypokinesis; low risk but abnormal  . Lower extremity arterial doppler  02/2012    RLE - mild to mod arterial insuff; RSFA 50-69% diameter reduction; R pop 50-69% diameter reduction; posterior tibial (R) demonstrates occlusive disease; LSFA/prox pop 0-49% diameter reduction; L distal pop 50-69% diameter reduction; posterior tibial (L) demonstrates occlusive disease  . Lower extremity angiogram  02/05/2011    Diamondback orbital rotational atherectomy, percutaneous transluminal angioplasty of high-grade calcified popliteal & tibioperoneal stenosis (Dr. Adora Fridge)  . Coronary artery bypass graft  04/1997    CABGx5 - LIMA to LAD, SVG to PDA, SVG to PLA, VG to OM1, VG to 2nd diagonal (Dr. Roxan Hockey)  . Cardiac catheterization  04/26/1997    CAD - 50-60% prox to mid LAD with 70% stenosis  in mid-distal, 70-80% 1st diag stenosis, 50-60% ostial stenosis in large 2nd diagonal; diffuse RCA stenosis with 40-50% osital narrowing with 70% prox stenosis (Dr. Corky Downs) >> CABG  . Cardiac catheterization  11/01/2014  . Tonsillectomy    . Hernia repair    . Inguinal hernia repair Bilateral   . Cataract extraction w/ intraocular lens  implant, bilateral Bilateral   . Left and right heart catheterization with coronary angiogram N/A 11/01/2014    Procedure: LEFT AND RIGHT HEART CATHETERIZATION WITH CORONARY ANGIOGRAM;  Surgeon: Troy Sine, MD; EF35-40%, mild AS w/ 2+ AI, LAD, CFX, RCA 100%, LIMA-LAD OK, SVG-D1 OK, SVG-PDA 30%, SVG-OM 95%  . Percutaneous coronary stent intervention (pci-s) N/A 11/02/2014    Procedure: PERCUTANEOUS CORONARY STENT INTERVENTION (PCI-S);  Surgeon: Troy Sine, MD; 3.015 mm Resolute integrity DES to the SVG-OM     Allergies  Allergen Reactions  . Other     Patient is allergic to something but doesn't know the name.  . Pramipexole     Current Outpatient Prescriptions  Medication Sig Dispense Refill  . amLODipine (NORVASC) 10 MG tablet Take 1 tablet (10 mg total) by mouth daily. 30 tablet 3  . aspirin 81 MG tablet Take 81 mg by mouth daily.    Marland Kitchen atenolol (TENORMIN) 100 MG tablet Take 1 tablet (100 mg total) by mouth 2 (two) times daily. 180 tablet 3  . atorvastatin (LIPITOR) 40 MG tablet Take 1 tablet (40 mg total) by mouth daily at 6 PM. 30 tablet 3  . Calcium-Vitamin D (CALTRATE 600 PLUS-VIT D PO) Take 1 tablet by mouth daily.     . clopidogrel (PLAVIX) 75 MG tablet Take 1 tablet (75 mg total) by mouth daily. 90 tablet 3  . gabapentin (NEURONTIN) 300 MG capsule Take 1 capsule (300 mg total) by mouth at bedtime. 90 capsule 3  . hydrALAZINE (APRESOLINE) 25 MG tablet Take 1 tablet (25 mg total) by mouth every 8 (eight) hours. 90 tablet 3  . metformin (FORTAMET) 1000 MG (OSM) 24 hr tablet Take 1,000 mg by mouth 2 (two) times daily with a meal.    .  Multiple Vitamins-Minerals (PRESERVISION AREDS 2 PO) Take 1 capsule by mouth 2 (two) times daily.     . multivitamin-iron-minerals-folic acid (CENTRUM) chewable tablet Chew 1 tablet by mouth daily.    . niacin (NIASPAN) 750 MG CR tablet Take 2 tablets (1,500 mg total) by mouth at bedtime. 180 tablet 3  . NITROSTAT 0.4 MG SL tablet Place 0.4 mg under the tongue every 5 (five) minutes as needed for chest pain.     Marland Kitchen oxymetazoline (  AFRIN) 0.05 % nasal spray Place 2 sprays into both nostrils 3 (three) times daily as needed for congestion (Allergies).    . polycarbophil (FIBERCON) 625 MG tablet Take 625 mg by mouth daily.    Marland Kitchen torsemide (DEMADEX) 20 MG tablet TAKE 1 TABLET TWICE A DAY 180 tablet 0  . zoledronic acid (RECLAST) 5 MG/100ML SOLN Inject 100 mLs (5 mg total) into the vein once. 100 mL 0   No current facility-administered medications for this visit.    Socially he is married, retired, and has one child.  He does not routinely exercise. There is no tobacco or alcohol use.  ROS General: Negative; No fevers, chills, or night sweats;  HEENT: Negative; No changes in vision or hearing, sinus congestion, difficulty swallowing Pulmonary: Negative; No cough, wheezing, shortness of breath, hemoptysis Cardiovascular: See HPI;  GI: Negative; No nausea, vomiting, diarrhea, or abdominal pain GU: Negative; No dysuria, hematuria, or difficulty voiding Musculoskeletal: Negative; no myalgias, joint pain, or weakness Hematologic/Oncology: Negative; no easy bruising, bleeding Endocrine: Negative; no heat/cold intolerance; no diabetes Neuro: Negative; no changes in balance, headaches Skin: Positive for fungating scalp lesion Psychiatric: Negative; No behavioral problems, depression Sleep: Negative; No snoring, daytime sleepiness, hypersomnolence, bruxism, restless legs, hypnogognic hallucinations, no cataplexy Other comprehensive 14 point system review is negative.   PE BP 112/58 mmHg  Pulse 53  Ht  5' 7.75" (1.721 m)  Wt 177 lb 9.6 oz (80.559 kg)  BMI 27.20 kg/m2  Wt Readings from Last 3 Encounters:  06/15/15 177 lb 9.6 oz (80.559 kg)  04/27/15 178 lb (80.74 kg)  02/09/15 180 lb 6.4 oz (81.829 kg)   General: Alert, oriented, no distress.  HEENT: Normocephalic, atraumatic. Pupils round and reactive; sclera anicteric; fungating type lesion on the top of his head which has grown in size and now was covered with a white cream to debulk the lesion. Nose without nasal septal hypertrophy; bandage due to recent biopsy which proved to be low level malignancy Mouth/Parynx benign; Mallinpatti scale  2 Neck: No JVD, no carotid bruits with transmitted murmur Chest: No tenderness to palpation Lungs: clear to ausculatation and percussion; no wheezing or rales Heart: RRR, s1 s2 normal 2/6 , 2/6 harsh systolic murmur in the aortic area and 1/ 6 diastolic murmur compatible with AS/AR. No rubs thrills or heaves. Abdomen: soft, nontender; no hepatosplenomehaly, BS+; abdominal aorta nontender and not dilated by palpation. Back: No CVA Pulses 2+ Extremities: Bilateral 1-2+ pitting edema of his ankles;, Homan's sign negative  Neurologic: grossly nonfocal Psychologic: Normal affect and mood  ECG (independently read by me): Sinus bradycardia 53 bpm with left bundle branch block.  No ectopy.  Q wave in lead 3.  August 2016 ECG (independently read by me): Sinus bradycardia 54 bpm with mild sinus arrhythmia.  Left bundle branch block with repolarization changes.  March 2016 ECG (independently read by me): Sinus bradycardia 54 bpm.  Left bundle branch block with repolarization changes.  QTc interval 470 ms.  November 2015 ECG (independently read by me): Sinus bradycardia 55 bpm with left bundle branch block and repolarization changes  ECG (independently read by me): Sinus bradycardia 57 beats per minute.  Left bundle branch block with repolarization changes.  PR interval is now normal at 162 ms  Prior  February 2015 ECG (independently read by me): Sinus bradycardia 58 beats per minute. Left bundle branch block. Borderline first-degree block with a PR interval 208 ms the  Prior ECG of 06/22/2013:  Sinus rhythm at 63 beats  per minute with resolution of prior first degree heart block. Left bundle branch block with repolarization changes.  LABS:  BMP Latest Ref Rng 11/16/2014 11/03/2014 11/02/2014  Glucose 70 - 99 mg/dL 90 128(H) 140(H)  BUN 6 - 23 mg/dL _0 Creatinine 0.40 - 1.50 mg/dL 0.78 0.87 0.86  BUN/Creat Ratio 10 - 22 - - -  Sodium 135 - 145 mEq/L 133(L) 141 139  Potassium 3.5 - 5.1 mEq/L 4.2 3.8 3.8  Chloride 96 - 112 mEq/L 97 107 102  CO2 19 - 32 mEq/L _1 Calcium 8.4 - 10.5 mg/dL 10.3 8.8 8.9    Hepatic Function Latest Ref Rng 10/26/2014 09/28/2014 09/16/2012  Total Protein 6.0 - 8.3 g/dL 7.0 6.3 6.4  Albumin 3.5 - 5.2 g/dL 4.6 4.1 4.3  AST 0 - 37 U/L _2 ALT 0 - 53 U/L _3 Alk Phosphatase 39 - 117 U/L 55 55 46  Total Bilirubin 0.2 - 1.2 mg/dL 0.6 0.5 0.6  Bilirubin, Direct 0.0 - 0.3 mg/dL - - 0.1    CBC Latest Ref Rng 11/16/2014 11/03/2014 11/02/2014  WBC 4.0 - 10.5 K/uL 5.9 7.5 8.2  Hemoglobin 13.0 - 17.0 g/dL 11.8(L) 12.3(L) 12.6(L)  Hematocrit 39.0 - 52.0 % 34.8(L) 38.0(L) 38.3(L)  Platelets 150.0 - 400.0 K/uL 175.0 127(L) 131(L)   Lab Results  Component Value Date   TSH 3.170 09/28/2014    Lipid Panel     Component Value Date/Time   CHOL 135 09/28/2014 0933   TRIG 77 09/28/2014 0933   HDL 48 09/28/2014 0933   LDLCALC 72 09/28/2014 0933      RADIOLOGY: No results found.    ASSESSMENT AND PLAN:   Mr. Ryle is an 81 year old white male who is 18 years status post CABG surgery.   A  nuclear perfusion study in March 2013 revealed fairly normal perfusion with the exception of a mild anteroseptal defect most likely related to his left bundle branch block.  He developed recurrent chest pain and increasing shortness of breath  symptomatology earlier this year and a nuclear study was changed and demonstrated new lateral wall ischemia.  He has mild-to-moderate aortic stenosis with reduced LV function with an ejection fraction of 35-40% which has remained stable over the past several years.  He has stable but chronic claudication symptoms and has documented one vessel runoff via the peroneal artery of his right lower extremity.  Cardiac catheterization was performed and was found to have 95% stenosis in the sequential vein graft supplying 2 marginal vessels which accounts for his lateral ischemia seen as most recent study.  He is felt significantly improved following successful intervention with insertion of a 3.015 mm Resolute DES stent postdilated to 3.25 mm.  Shortness of breath has improved.  Right heart catheterization demonstrated only very mild pulmonary hypertension with a PA pressure 32/17.  He and mild aortic stenosis with a peak aortic gradient of 7, and the mean aortic valve gradient of 10.  His aortic valve area is 1.9 cm.  His ECG today is stable with sinus bradycardia.  For the last several days despite taking atenolol 100 mg in the morning and 50 mg around dinner.  He was experiencing more palpitations such that he took in the 50 mg dose the last 2 nights.  This has resulted in resolution of those palpitations.  His ECG today shows sinus bradycardia at 53.  He is not dizzy.  There are no orthostatic symptoms.  I have suggested that he can take 100 mg twice a day if needed depending upon his symptoms rather than the 100/50 twice a day.  He has 2+ pitting edema of his ankles today and has been on torsemide.  He did not take his torsemide this morning due to his anticipation for his evaluation.  If there are days with increased edema he can take 20 mg.  His dermatologic lesions are now being followed by Dr. Nevada Crane.  He was found to have a low level skin cancer on his nose and his fungating scalp lesion is been debulked prior to  further treatment.  I will see him in 6 months for reevaluation.  Time spent: 25 minutes   Troy Sine, MD, [facility]  06/17/2015 11:37 AM

## 2015-06-26 ENCOUNTER — Encounter: Payer: Self-pay | Admitting: Endocrinology

## 2015-06-26 ENCOUNTER — Ambulatory Visit (INDEPENDENT_AMBULATORY_CARE_PROVIDER_SITE_OTHER): Payer: Medicare Other | Admitting: Endocrinology

## 2015-06-26 VITALS — BP 116/50 | HR 50 | Temp 98.2°F | Resp 14 | Ht 66.0 in | Wt 180.4 lb

## 2015-06-26 DIAGNOSIS — M810 Age-related osteoporosis without current pathological fracture: Secondary | ICD-10-CM | POA: Diagnosis not present

## 2015-06-26 DIAGNOSIS — I200 Unstable angina: Secondary | ICD-10-CM

## 2015-06-26 LAB — COMPREHENSIVE METABOLIC PANEL
ALK PHOS: 59 U/L (ref 39–117)
ALT: 15 U/L (ref 0–53)
AST: 20 U/L (ref 0–37)
Albumin: 0.5 g/dL — ABNORMAL LOW (ref 3.5–5.2)
BUN: 16 mg/dL (ref 6–23)
CO2: 29 meq/L (ref 19–32)
Calcium: 10.7 mg/dL — ABNORMAL HIGH (ref 8.4–10.5)
Chloride: 95 mEq/L — ABNORMAL LOW (ref 96–112)
Creatinine, Ser: 0.85 mg/dL (ref 0.40–1.50)
GFR: 90.96 mL/min (ref >60.00)
Glucose, Bld: 80 mg/dL (ref 70–99)
POTASSIUM: 4.8 meq/L (ref 3.5–5.1)
SODIUM: 136 meq/L (ref 135–145)
TOTAL PROTEIN: 7.3 g/dL (ref 6.0–8.3)
Total Bilirubin: 0.5 mg/dL (ref 0.2–1.2)

## 2015-06-26 LAB — VITAMIN D 25 HYDROXY (VIT D DEFICIENCY, FRACTURES): VITD: 49.24 ng/mL (ref 30.00–100.00)

## 2015-06-26 NOTE — Progress Notes (Signed)
Quick Note:  Please let patient know that the calcium level is mildly increased, to stop calcium supplements, vitamin D level is normal  Need to check PTH level from [facility] and he can do it through his PCP if more convenient ______

## 2015-06-26 NOTE — Addendum Note (Signed)
Addended by: Adline MangoSTONE-ELMORE, Bayla Mcgovern I on: 06/26/2015 11:23 AM   Modules accepted: Orders

## 2015-06-26 NOTE — Progress Notes (Signed)
Patient ID: JAYLEEN AFONSO, male   DOB: 1927/11/12, 81 y.o.   MRN: [id number]   Chief complaint: Low back pain, follow-up osteoporosis  History of Present Illness:   He has had low back pain for several years. His bones appeared osteopenic on x-rays in 2004 and the bone density initially showed T score of -3.0 and the spine and -2.7 at the hips.   No history of any fractures and none found on x-rays. Initially tried on Fosamax which he could not tolerate  In 2005 was given Forteo for 2 years because of a decline in lumbar bone density while taking Actonel and subsequently started back on Actonel in 2007. Bone density improved significantly with Forteo He has been given Reclast infusions since 2011 which he has tolerated well; last infusion was in 10/2013   With above treatments his last bone density had improved and subsequently stabilized in 2013 with T score -2.4 at the spine and -1.9 at the hip, bone density was not scheduled as ordered earlier this year  His height now appearing to be 1 inch less, previously was 5 feet 7 Vitamin D level has been adequate previously; did have high normal calcium without increased PTH previously Last calcium level was normal  He continues to have tolerable low back pain but has had significant degenerative disc disease and osteoarthritis .     Medication List       This list is accurate as of: 06/26/15 11:19 AM.  Always use your most recent med list.               amLODipine 10 MG tablet  Commonly known as:  NORVASC  Take 1 tablet (10 mg total) by mouth daily.     aspirin 81 MG tablet  Take 81 mg by mouth daily.     atenolol 100 MG tablet  Commonly known as:  TENORMIN  Take 1 tablet (100 mg total) by mouth 2 (two) times daily.     atorvastatin 40 MG tablet  Commonly known as:  LIPITOR  Take 1 tablet (40 mg total) by mouth daily at 6 PM.     CALTRATE 600 PLUS-VIT D PO  Take 1 tablet by mouth daily.     clopidogrel 75  MG tablet  Commonly known as:  PLAVIX  Take 1 tablet (75 mg total) by mouth daily.     gabapentin 300 MG capsule  Commonly known as:  NEURONTIN  Take 1 capsule (300 mg total) by mouth at bedtime.     hydrALAZINE 25 MG tablet  Commonly known as:  APRESOLINE  Take 1 tablet (25 mg total) by mouth every 8 (eight) hours.     metformin 1000 MG (OSM) 24 hr tablet  Commonly known as:  FORTAMET  Take 1,000 mg by mouth 2 (two) times daily with a meal.     multivitamin-iron-minerals-folic acid chewable tablet  Chew 1 tablet by mouth daily.     PRESERVISION AREDS 2 PO  Take 1 capsule by mouth 2 (two) times daily.     niacin 750 MG CR tablet  Commonly known as:  NIASPAN  Take 2 tablets (1,500 mg total) by mouth at bedtime.     NITROSTAT 0.4 MG SL tablet  Generic drug:  nitroGLYCERIN  Place 0.4 mg under the tongue every 5 (five) minutes as needed for chest pain.     oxymetazoline 0.05 % nasal spray  Commonly known as:  AFRIN  Place 2 sprays into both  nostrils 3 (three) times daily as needed for congestion (Allergies).     polycarbophil 625 MG tablet  Commonly known as:  FIBERCON  Take 625 mg by mouth daily.     torsemide 20 MG tablet  Commonly known as:  DEMADEX  TAKE 1 TABLET TWICE A DAY     zoledronic acid 5 MG/100ML Soln injection  Commonly known as:  RECLAST  Inject 100 mLs (5 mg total) into the vein once.        Allergies:  Allergies  Allergen Reactions  . Other     Patient is allergic to something but doesn't know the name.  . Pramipexole     Past Medical History  Diagnosis Date  . High cholesterol   . Unspecified hereditary and idiopathic peripheral neuropathy 10/01/2012  . RLS (restless legs syndrome) 10/01/2012  . Hypertension   . PAD (peripheral artery disease) (HCC)   . PVC (premature ventricular contraction) 04/26/1997    on beta blocker/pt had cardio net monitor 06/23/2012  . LBBB (left bundle branch block)   . Heart murmur   . Coronary artery disease    . Type II diabetes mellitus (HCC)   . History of hiatal hernia   . GERD (gastroesophageal reflux disease)   . Arthritis     "just about all over my body"  . Osteoporosis   . Age-related macular degeneration, dry, both eyes     Past Surgical History  Procedure Laterality Date  . Hiatal hernia repair    . Appendectomy    . Doppler echocardiography  05/18/2012    EF 45-50%, LV systolic function mildly reduced; borderline LA enlargment; mild mitral annular calcif, mild-mod MR; mild TR with normal RVSP; mild calcification of AV leaflets and mild-mod valvular AS with mild regurg      . Doppler echocardiography  08/19/2011    EF 50-55%, mod conc LVH; mild mitral annular calcif with mild MR; mild-mod TR; AV mildly sclerotic with mild valvular AS and mild regurg, mild aortic root dilatation  . Nm myocar perf ejection fraction  09/2011    lexiscan - no reversible ischmai, fixed anteroseptal defect r/t LBBB; EF 51%; septal hypokinesis; low risk but abnormal  . Lower extremity arterial doppler  02/2012    RLE - mild to mod arterial insuff; RSFA 50-69% diameter reduction; R pop 50-69% diameter reduction; posterior tibial (R) demonstrates occlusive disease; LSFA/prox pop 0-49% diameter reduction; L distal pop 50-69% diameter reduction; posterior tibial (L) demonstrates occlusive disease  . Lower extremity angiogram  02/05/2011    Diamondback orbital rotational atherectomy, percutaneous transluminal angioplasty of high-grade calcified popliteal & tibioperoneal stenosis (Dr. Erlene QuanJ. Berry)  . Coronary artery bypass graft  04/1997    CABGx5 - LIMA to LAD, SVG to PDA, SVG to PLA, VG to OM1, VG to 2nd diagonal (Dr. Dorris FetchHendrickson)  . Cardiac catheterization  04/26/1997    CAD - 50-60% prox to mid LAD with 70% stenosis in mid-distal, 70-80% 1st diag stenosis, 50-60% ostial stenosis in large 2nd diagonal; diffuse RCA stenosis with 40-50% osital narrowing with 70% prox stenosis (Dr. Bishop Limbo. Kelly) >> CABG  . Cardiac  catheterization  11/01/2014  . Tonsillectomy    . Hernia repair    . Inguinal hernia repair Bilateral   . Cataract extraction w/ intraocular lens  implant, bilateral Bilateral   . Left and right heart catheterization with coronary angiogram N/A 11/01/2014    Procedure: LEFT AND RIGHT HEART CATHETERIZATION WITH CORONARY ANGIOGRAM;  Surgeon: Lennette Biharihomas A Kelly, MD; EF35-40%,  mild AS w/ 2+ AI, LAD, CFX, RCA 100%, LIMA-LAD OK, SVG-D1 OK, SVG-PDA 30%, SVG-OM 95%  . Percutaneous coronary stent intervention (pci-s) N/A 11/02/2014    Procedure: PERCUTANEOUS CORONARY STENT INTERVENTION (PCI-S);  Surgeon: Lennette Bihari, MD; 3.015 mm Resolute integrity DES to the SVG-OM     Family History  Problem Relation Age of Onset  . CAD Mother   . Heart attack Father     Social History:  reports that he has never smoked. He has never used smokeless tobacco. He reports that he does not drink alcohol or use illicit drugs.  Review of Systems   Weight changes:  Wt Readings from Last 3 Encounters:  06/26/15 180 lb 6.4 oz (81.829 kg)  06/15/15 177 lb 9.6 oz (80.559 kg)  04/27/15 178 lb (80.74 kg)   Diabetes: glucose variable at home but usually fairly good.  However checking these only in the morning with usual range about 105-120 A1c 6.4  from PCP    No results found for: HGBA1C   EXAM:  BP 116/50 mmHg  Pulse 50  Temp(Src) 98.2 F (36.8 C)  Resp 14  Ht  (1.676 m)  Wt 180 lb 6.4 oz (81.829 kg)  BMI 29.13 kg/m2  SpO2 95%  He has significant dorsal kyphosis and prominence of the lower end of the thoracic spine, no tenderness  Lumbar spine appears normal  Assessment/Plan:   Osteoporosis, long-standing with variable response to bisphosphonate treatments. Had responded well to [facility] given in 2005 and has been maintaining his bone density since then with Reclast infusions Because of his height loss as seen today will need to recheck his bone density No history of fractures and clinically doing  well except for back pain related to degenerative disease  Also recheck chemistry panel and vitamin D today  Berania Peedin 06/26/2015, 11:19 AM

## 2015-06-27 ENCOUNTER — Other Ambulatory Visit: Payer: Self-pay | Admitting: *Deleted

## 2015-06-27 DIAGNOSIS — M810 Age-related osteoporosis without current pathological fracture: Secondary | ICD-10-CM

## 2015-06-28 ENCOUNTER — Other Ambulatory Visit: Payer: Self-pay | Admitting: Cardiovascular Disease

## 2015-06-28 NOTE — Telephone Encounter (Signed)
Rx(s) sent to pharmacy electronically.  

## 2015-06-29 ENCOUNTER — Ambulatory Visit (HOSPITAL_COMMUNITY)
Admission: RE | Admit: 2015-06-29 | Discharge: 2015-06-29 | Disposition: A | Discharge status: Home / Self Care | Payer: Medicare Other | Source: Ambulatory Visit | Attending: Endocrinology | Admitting: Endocrinology

## 2015-06-29 DIAGNOSIS — Z7902 Long term (current) use of antithrombotics/antiplatelets: Secondary | ICD-10-CM | POA: Diagnosis not present

## 2015-06-29 DIAGNOSIS — M8580 Other specified disorders of bone density and structure, unspecified site: Secondary | ICD-10-CM | POA: Insufficient documentation

## 2015-06-29 DIAGNOSIS — M810 Age-related osteoporosis without current pathological fracture: Secondary | ICD-10-CM | POA: Insufficient documentation

## 2015-08-15 ENCOUNTER — Ambulatory Visit (INDEPENDENT_AMBULATORY_CARE_PROVIDER_SITE_OTHER): Payer: Medicare Other | Admitting: Nutrition

## 2015-08-15 DIAGNOSIS — M810 Age-related osteoporosis without current pathological fracture: Secondary | ICD-10-CM

## 2015-08-15 NOTE — Patient Instructions (Signed)
Drink plenty of water tonight and tomorrow Call if questions or problems

## 2015-08-15 NOTE — Progress Notes (Signed)
Zoledronic Acid injection /110ml was administered into the R antecubital space with an 18g needle.  He reported no discomfort or side effects during the infusion. After 25 min., the infusion was finished and the needle removed.  Site showed no signes of redness or swelling.  Pt. Was told to drink plenty of fluids today and tomorrow.  We reviewed the side effects and he had no final questions.

## 2015-08-15 NOTE — Progress Notes (Deleted)
Subjective:     Patient ID: Melvin [name], male   DOB: [birth year], 82 y.o.   MRN: [id number]  HPI   Review of Systems     Objective:   Physical Exam     Assessment:     ***    Plan:     ***

## 2015-10-02 ENCOUNTER — Telehealth: Payer: Self-pay | Admitting: Cardiovascular Disease

## 2015-10-02 NOTE — Telephone Encounter (Signed)
Pt c/o palps, SOB. Pt on atenolol 100mg  in AM 'with option to take 100mg  at night'. States he's been taking nightly dose for quite some time and "things don't seem to be settling down". His palpitations are recurrent and have persisted for duration of months. SOB "has been going on for many months now".  He noted Dr. Tresa EndoKelly told him 6 mo f/u at last visit but if problems sooner to call. Pt notes his issues are ongoing but "just seems to be getting worse".  Pt preferred to see Dr. Tresa EndoKelly, but I checked availability and both pt and I agreed a midlevel provider visit would be best. Pt to see Theodore DemarkRhonda Barrett next Monday, and aware to call if worsening complaints in meantime.

## 2015-10-02 NOTE — Telephone Encounter (Signed)
New Message  Patient c/o Palpitations:  High priority if patient c/o lightheadedness and shortness of breath.  1. How long have you been having palpitations? Every day. Something that has gotten worse. Has had this for a while bu it has gotten worse   2. Are you currently experiencing lightheadedness and shortness of breath? SOB and some lightheadedness but that could be due to medication that could cause dizziness per pt  3. Have you checked your BP and heart rate? (document readings) no. Per pt he will need to start checking this   4. Are you experiencing any other symptoms? Sob  Pt c/o Shortness Of Breath: STAT if SOB developed within the last 24 hours or pt is noticeably SOB on the phone  1. Are you currently SOB (can you hear that pt is SOB on the phone)? No  2. How long have you been experiencing SOB? Ongoing for months  3. Are you SOB when sitting or when up moving around? Sitting or moving both ( upto 1a he had problems last night)

## 2015-10-09 ENCOUNTER — Encounter: Payer: Self-pay | Admitting: Physician Assistant

## 2015-10-09 ENCOUNTER — Ambulatory Visit (INDEPENDENT_AMBULATORY_CARE_PROVIDER_SITE_OTHER): Payer: Medicare Other | Admitting: Physician Assistant

## 2015-10-09 VITALS — BP 100/50 | HR 48 | Ht 66.0 in | Wt 175.0 lb

## 2015-10-09 DIAGNOSIS — R0609 Other forms of dyspnea: Secondary | ICD-10-CM | POA: Diagnosis not present

## 2015-10-09 DIAGNOSIS — I2510 Atherosclerotic heart disease of native coronary artery without angina pectoris: Secondary | ICD-10-CM | POA: Diagnosis not present

## 2015-10-09 DIAGNOSIS — I952 Hypotension due to drugs: Secondary | ICD-10-CM

## 2015-10-09 DIAGNOSIS — I10 Essential (primary) hypertension: Secondary | ICD-10-CM

## 2015-10-09 DIAGNOSIS — I350 Nonrheumatic aortic (valve) stenosis: Secondary | ICD-10-CM

## 2015-10-09 DIAGNOSIS — R001 Bradycardia, unspecified: Secondary | ICD-10-CM

## 2015-10-09 DIAGNOSIS — I2583 Coronary atherosclerosis due to lipid rich plaque: Secondary | ICD-10-CM

## 2015-10-09 MED ORDER — HYDRALAZINE HCL 10 MG PO TABS: 10.0000 mg | ORAL_TABLET | Freq: Three times a day (TID) | ORAL | Status: DC

## 2015-10-09 MED ORDER — METOPROLOL TARTRATE 50 MG PO TABS: 50.0000 mg | ORAL_TABLET | Freq: Two times a day (BID) | ORAL | Status: DC

## 2015-10-09 NOTE — Patient Instructions (Addendum)
Rhonda Barrett, PA-C, has recommended making the following medication changes: 1. DECREASE Hydralazine to 10 mg THREES times daily 2. STOP Atenolol 3. START Metoprolol Tartrate 50 mg - take 1 tablet (50 mg total) by mouth twice daily  Bjorn LoserRhonda recommends that you schedule a follow-up appointment on April 24th.  If you need a refill on your cardiac medications before your next appointment, please call your pharmacy.   **Please read and check food labels for sodium content.  Low-Sodium Eating Plan Sodium raises blood pressure and causes water to be held in the body. Getting less sodium from food will help lower your blood pressure, reduce any swelling, and protect your heart, liver, and kidneys. We get sodium by adding salt (sodium chloride) to food. Most of our sodium comes from canned, boxed, and frozen foods. Restaurant foods, fast foods, and pizza are also very high in sodium. Even if you take medicine to lower your blood pressure or to reduce fluid in your body, getting less sodium from your food is important. WHAT IS MY PLAN? Most people should limit their sodium intake to 2,300 mg a day. Your health care provider recommends that you limit your sodium intake to __________ a day.  WHAT DO I NEED TO KNOW ABOUT THIS EATING PLAN? For the low-sodium eating plan, you will follow these general guidelines:  Choose foods with a % Daily Value for sodium of less than 5% (as listed on the food label).   Use salt-free seasonings or herbs instead of table salt or sea salt.   Check with your health care provider or pharmacist before using salt substitutes.   Eat fresh foods.  Eat more vegetables and fruits.  Limit canned vegetables. If you do use them, rinse them well to decrease the sodium.   Limit cheese to 1 oz (28 g) per day.   Eat lower-sodium products, often labeled as "lower sodium" or "no salt added."  Avoid foods that contain monosodium glutamate (MSG). MSG is sometimes added to  [place] food and some canned foods.  Check food labels (Nutrition Facts labels) on foods to learn how much sodium is in one serving.  Eat more home-cooked food and less restaurant, buffet, and fast food.  When eating at a restaurant, ask that your food be prepared with less salt, or no salt if possible.  HOW DO I READ FOOD LABELS FOR SODIUM INFORMATION? The Nutrition Facts label lists the amount of sodium in one serving of the food. If you eat more than one serving, you must multiply the listed amount of sodium by the number of servings. Food labels may also identify foods as:  Sodium free--Less than 5 mg in a serving.  Very low sodium--35 mg or less in a serving.  Low sodium--140 mg or less in a serving.  Light in sodium--50% less sodium in a serving. For example, if a food that usually has 300 mg of sodium is changed to become light in sodium, it will have 150 mg of sodium.  Reduced sodium--25% less sodium in a serving. For example, if a food that usually has 400 mg of sodium is changed to reduced sodium, it will have 300 mg of sodium. WHAT FOODS CAN I EAT? Grains Low-sodium cereals, including oats, puffed wheat and rice, and shredded wheat cereals. Low-sodium crackers. Unsalted rice and pasta. Lower-sodium bread.  Vegetables Frozen or fresh vegetables. Low-sodium or reduced-sodium canned vegetables. Low-sodium or reduced-sodium tomato sauce and paste. Low-sodium or reduced-sodium tomato and vegetable juices.  Fruits Fresh,  frozen, and canned fruit. Fruit juice.  Meat and Other Protein Products Low-sodium canned tuna and salmon. Fresh or frozen meat, poultry, seafood, and fish. Lamb. Unsalted nuts. Dried beans, peas, and lentils without added salt. Unsalted canned beans. Homemade soups without salt. Eggs.  Dairy Milk. Soy milk. Ricotta cheese. Low-sodium or reduced-sodium cheeses. Yogurt.  Condiments Fresh and dried herbs and spices. Salt-free seasonings. Onion and garlic  powders. Low-sodium varieties of mustard and ketchup. Fresh or refrigerated horseradish. Lemon juice.  Fats and Oils Reduced-sodium salad dressings. Unsalted butter.  Other Unsalted popcorn and pretzels.  The items listed above may not be a complete list of recommended foods or beverages. Contact your dietitian for more options. WHAT FOODS ARE NOT RECOMMENDED? Grains Instant hot cereals. Bread stuffing, pancake, and biscuit mixes. Croutons. Seasoned rice or pasta mixes. Noodle soup cups. Boxed or frozen macaroni and cheese. Self-rising flour. Regular salted crackers. Vegetables Regular canned vegetables. Regular canned tomato sauce and paste. Regular tomato and vegetable juices. Frozen vegetables in sauces. Salted [place] fries. Olives. Angie Fava. Relishes. Sauerkraut. Salsa. Meat and Other Protein Products Salted, canned, smoked, spiced, or pickled meats, seafood, or fish. Bacon, ham, sausage, hot dogs, corned beef, chipped beef, and packaged luncheon meats. Salt pork. Jerky. Pickled herring. Anchovies, regular canned tuna, and sardines. Salted nuts. Dairy Processed cheese and cheese spreads. Cheese curds. Blue cheese and cottage cheese. Buttermilk.  Condiments Onion and garlic salt, seasoned salt, table salt, and sea salt. Canned and packaged gravies. Worcestershire sauce. Tartar sauce. Barbecue sauce. Teriyaki sauce. Soy sauce, including reduced sodium. Steak sauce. Fish sauce. Oyster sauce. Cocktail sauce. Horseradish that you find on the shelf. Regular ketchup and mustard. Meat flavorings and tenderizers. Bouillon cubes. Hot sauce. Tabasco sauce. Marinades. Taco seasonings. Relishes. Fats and Oils Regular salad dressings. Salted butter. Margarine. Ghee. Bacon fat.  Other Potato and tortilla chips. Corn chips and puffs. Salted popcorn and pretzels. Canned or dried soups. Pizza. Frozen entrees and pot pies.  The items listed above may not be a complete list of foods and beverages to  avoid. Contact your dietitian for more information.   This information is not intended to replace advice given to you by your health care provider. Make sure you discuss any questions you have with your health care provider.   Document Released: 12/14/2001 Document Revised: 07/15/2014 Document Reviewed: 04/28/2013 Elsevier Interactive Patient Education [employer].

## 2015-10-09 NOTE — Progress Notes (Signed)
Cardiology Office Note   Date:  10/09/2015   ID:  Melvin [name], DOB 28-Dec-1927, MRN [id number]  PCP:  Colette Ribas, MD  Cardiologist:  Dr Venora Maples, PA-C   Chief Complaint  Patient presents with  . Shortness of Breath    Heart beating hard onset 2 months has gotten worse    History of Present Illness: Melvin [name] is a 82 y.o. male with a history of ventricular ectopy, CABG 1998 (LIMA-LAD, SVG-D1, SVG-OM1, SVG-PDA-PLA), DES to SVG-OM 06/2015, LBBB, PAD, EF 45-50% w/ grade 1 dd, mild-mod AS/AI  Melvin [name] presents for Evaluation of palpitations, presyncope and edema.  [name] has been struggling for months. He has had lower extremity edema although his weight has not changed on his home scales. He has had dyspnea on exertion which again has not changed significantly in the last few months but is very bothersome to him because it limits his activity significantly. He has orthopnea, stating he has not slept in a bed in months, he chronically sleeps in a recliner. Part of this is due to anxiety as he had significant difficulties when he was hospitalized previously with heart failure and tried to lie down. He seems to have some significant anxiety about this in addition to the shortness of breath.  He gets lightheaded whenever he first stands up. He also has occasional feelings of being lightheaded when he bends over to do something such as tight shoes and then stands up. He has not passed out and the symptoms resolved in less than a minute.  He feels his heart skip at times and also feels his heart pound at times. When his heart pounds, the rate will be less than 100, but he feels that it is beating extremely hard and this is concerning to him. His heart also skip's and this is not a new problem but it may be happening more often and this is concerning to him.   Past Medical History  Diagnosis Date  . High cholesterol   . Unspecified  hereditary and idiopathic peripheral neuropathy 10/01/2012  . RLS (restless legs syndrome) 10/01/2012  . Hypertension   . PAD (peripheral artery disease) (HCC)   . PVC (premature ventricular contraction) 04/26/1997    on beta blocker/pt had cardio net monitor 06/23/2012  . LBBB (left bundle branch block)   . Heart murmur   . Coronary artery disease   . Type II diabetes mellitus (HCC)   . History of hiatal hernia   . GERD (gastroesophageal reflux disease)   . Arthritis     "just about all over my body"  . Osteoporosis   . Age-related macular degeneration, dry, both eyes     Past Surgical History  Procedure Laterality Date  . Hiatal hernia repair    . Appendectomy    . Doppler echocardiography  05/18/2012    EF 45-50%, LV systolic function mildly reduced; borderline LA enlargment; mild mitral annular calcif, mild-mod MR; mild TR with normal RVSP; mild calcification of AV leaflets and mild-mod valvular AS with mild regurg      . Doppler echocardiography  08/19/2011    EF 50-55%, mod conc LVH; mild mitral annular calcif with mild MR; mild-mod TR; AV mildly sclerotic with mild valvular AS and mild regurg, mild aortic root dilatation  . Nm myocar perf ejection fraction  09/2011    lexiscan - no reversible ischmai, fixed anteroseptal defect r/t LBBB; EF 51%; septal hypokinesis; low risk  but abnormal  . Lower extremity arterial doppler  02/2012    RLE - mild to mod arterial insuff; RSFA 50-69% diameter reduction; R pop 50-69% diameter reduction; posterior tibial (R) demonstrates occlusive disease; LSFA/prox pop 0-49% diameter reduction; L distal pop 50-69% diameter reduction; posterior tibial (L) demonstrates occlusive disease  . Lower extremity angiogram  02/05/2011    Diamondback orbital rotational atherectomy, percutaneous transluminal angioplasty of high-grade calcified popliteal & tibioperoneal stenosis (Dr. Erlene QuanJ. Berry)  . Coronary artery bypass graft  04/1997    CABGx5 - LIMA to LAD, SVG to  PDA, SVG to PLA, VG to OM1, VG to 2nd diagonal (Dr. Dorris FetchHendrickson)  . Cardiac catheterization  04/26/1997    CAD - 50-60% prox to mid LAD with 70% stenosis in mid-distal, 70-80% 1st diag stenosis, 50-60% ostial stenosis in large 2nd diagonal; diffuse RCA stenosis with 40-50% osital narrowing with 70% prox stenosis (Dr. Bishop Limbo. Kelly) >> CABG  . Cardiac catheterization  11/01/2014  . Tonsillectomy    . Hernia repair    . Inguinal hernia repair Bilateral   . Cataract extraction w/ intraocular lens  implant, bilateral Bilateral   . Left and right heart catheterization with coronary angiogram N/A 11/01/2014    Procedure: LEFT AND RIGHT HEART CATHETERIZATION WITH CORONARY ANGIOGRAM;  Surgeon: Lennette Biharihomas A Kelly, MD; EF35-40%, mild AS w/ 2+ AI, LAD, CFX, RCA 100%, LIMA-LAD OK, SVG-D1 OK, SVG-PDA 30%, SVG-OM 95%  . Percutaneous coronary stent intervention (pci-s) N/A 11/02/2014    Procedure: PERCUTANEOUS CORONARY STENT INTERVENTION (PCI-S);  Surgeon: Lennette Biharihomas A Kelly, MD; 3.015 mm Resolute integrity DES to the SVG-OM     Current Outpatient Prescriptions  Medication Sig Dispense Refill  . amLODipine (NORVASC) 10 MG tablet Take 1 tablet (10 mg total) by mouth daily. 30 tablet 3  . aspirin 81 MG tablet Take 81 mg by mouth daily.    Marland Kitchen. atenolol (TENORMIN) 100 MG tablet Take 1 tablet (100 mg total) by mouth 2 (two) times daily. 180 tablet 3  . atorvastatin (LIPITOR) 40 MG tablet Take 1 tablet (40 mg total) by mouth daily at 6 PM. 30 tablet 3  . clopidogrel (PLAVIX) 75 MG tablet Take 1 tablet (75 mg total) by mouth daily. 90 tablet 3  . gabapentin (NEURONTIN) 300 MG capsule Take 1 capsule (300 mg total) by mouth at bedtime. 90 capsule 3  . hydrALAZINE (APRESOLINE) 25 MG tablet Take 1 tablet (25 mg total) by mouth every 8 (eight) hours. 90 tablet 3  . metformin (FORTAMET) 1000 MG (OSM) 24 hr tablet Take 1,000 mg by mouth 2 (two) times daily with a meal.    . Multiple Vitamins-Minerals (PRESERVISION AREDS 2 PO) Take 1  capsule by mouth 2 (two) times daily.     . multivitamin-iron-minerals-folic acid (CENTRUM) chewable tablet Chew 1 tablet by mouth daily.    . niacin (NIASPAN) 750 MG CR tablet Take 2 tablets (1,500 mg total) by mouth at bedtime. 180 tablet 3  . NITROSTAT 0.4 MG SL tablet Place 0.4 mg under the tongue every 5 (five) minutes as needed for chest pain.     Marland Kitchen. oxymetazoline (AFRIN) 0.05 % nasal spray Place 2 sprays into both nostrils 3 (three) times daily as needed for congestion (Allergies).    . polycarbophil (FIBERCON) 625 MG tablet Take 625 mg by mouth daily.    Marland Kitchen. torsemide (DEMADEX) 20 MG tablet TAKE 1 TABLET TWICE A DAY 180 tablet 2  . zoledronic acid (RECLAST) 5 MG/100ML SOLN Inject 100 mLs (5 mg  total) into the vein once. 100 mL 0   No current facility-administered medications for this visit.    Allergies:   Other and Pramipexole    Social History:  The patient  reports that he has never smoked. He has never used smokeless tobacco. He reports that he does not drink alcohol or use illicit drugs.   Family History:  The patient's family history includes CAD in his mother; Heart attack in his father.    ROS:  Please see the history of present illness. All other systems are reviewed and negative.    PHYSICAL EXAM: VS:  BP 100/50 mmHg  Pulse 48  Ht  (1.676 m)  Wt 175 lb (79.379 kg)  BMI 28.26 kg/m2 , BMI Body mass index is 28.26 kg/(m^2). GEN: Well nourished, well developed, male in no acute distress HEENT: normal for age  Neck: JVD at 8 cm, carotid bruit versus radiation of murmur bilaterally, no masses Cardiac: RRR; S2 is slightly muffled, 3/6 murmur, no rubs, or gallops Respiratory: Decreased breath sounds bases bilaterally, normal work of breathing GI: soft, nontender, nondistended, + BS MS: no deformity or atrophy; 1+ bilateral lower extremity edema; distal pulses are 2+ in all 4 extremities  Skin: warm and dry, no rash Neuro:  Strength and sensation are intact Psych:  euthymic mood, full affect   EKG:  EKG is ordered today. ECG demonstrates sinus rhythm with a left bundle branch block which is old.   Recent Labs: 11/16/2014: Hemoglobin 11.8*; Platelets 175.0 06/26/2015: ALT 15; BUN 16; Creatinine, Ser 0.85; Potassium 4.8; Sodium 136    Lipid Panel    Component Value Date/Time   CHOL 135 09/28/2014 0933   TRIG 77 09/28/2014 0933   HDL 48 09/28/2014 0933   LDLCALC 72 09/28/2014 0933     Wt Readings from Last 3 Encounters:  10/09/15 175 lb (79.379 kg)  06/26/15 180 lb 6.4 oz (81.829 kg)  06/15/15 177 lb 9.6 oz (80.559 kg)     Other studies Reviewed: Additional studies/ records that were reviewed today include: Office notes and hospital records.  ASSESSMENT AND PLAN:  1.  Aortic stenosis: I'm concerned that the aortic stenosis is the cause of his feeling of being lightheaded and also his dyspnea on exertion. He has some volume overload by exam, but it is not that much. His S2 is slightly muffled. I will communicate with Dr. Tresa Endo regarding possible referral to the TAVR clinic  2. Dyspnea on exertion: He does not have very much volume overload by exam. His weight is actually down on our scales and has been stable on his home scales. He has mild lower extremity edema, but I worry that if we try to diurese him at all, his presyncope will get worse and he will be at significant risk of falling due to his aortic stenosis. Continue current medical therapy for now. He does have some dietary indiscretions with commercially prepared Foods and these were discussed with the patient and his wife who will try to do better.   3. Palpitations: The patient's heart rate becomes somewhat elevated at times. However he is not aware of it ever going over 100. His blood pressure is not high enough to go up on any of his medications. He was reassured that he can continue to track his heart rate, and contact us if his heart rate is sustaining greater than 100.  4.  Bradycardia: Mr. Eichel's heart rate is low today. He is taking atenolol 100 mg twice  a day. It does not seem to be effective in controlling his palpitations. Additionally, he is having some resting bradycardia. I discussed this with him and the decision was to change his beta blocker to metoprolol 50 mg twice a day. He is to see how his blood pressure and heart rate do on this and let us know if the palpitations get better or worse or if he has any additional symptoms. He will follow-up serum this is the appropriate dose for him, if may need to be increased or decreased depending on his level of symptoms. The patient was in agreement with this plan of care.  5. Hypotension: The patient is hypotensive in the office today and this may be contributing to his feelings of being lightheaded or weak. We will decrease the hydralazine to 10 mg 3 times a day. He will contact us if his blood pressure continues to run low.  Current medicines are reviewed at length with the patient today.  The patient does not have concerns regarding medicines.  The following changes have been made:  Decrease hydralazine and change atenolol to metoprolol  Labs/ tests ordered today include:    Disposition:   FU with me in a month and then with Dr. Tresa Endo  Signed, Theodore Demark, PA-C  10/09/2015 2:17 PM     [facility] Phone: [phone]; Fax: [phone]  This note was written with the assistance of speech recognition software. Please excuse any transcriptional errors.

## 2015-10-12 ENCOUNTER — Other Ambulatory Visit: Payer: Self-pay | Admitting: Cardiovascular Disease

## 2015-10-13 NOTE — Telephone Encounter (Signed)
Rx(s) sent to pharmacy electronically.  

## 2015-10-26 ENCOUNTER — Encounter: Payer: Self-pay | Admitting: Neurology

## 2015-10-26 ENCOUNTER — Ambulatory Visit (INDEPENDENT_AMBULATORY_CARE_PROVIDER_SITE_OTHER): Payer: Medicare Other | Admitting: Neurology

## 2015-10-26 VITALS — BP 132/62 | HR 62 | Resp 16 | Ht 66.0 in | Wt 175.0 lb

## 2015-10-26 DIAGNOSIS — M79604 Pain in right leg: Secondary | ICD-10-CM

## 2015-10-26 DIAGNOSIS — I2510 Atherosclerotic heart disease of native coronary artery without angina pectoris: Secondary | ICD-10-CM | POA: Diagnosis not present

## 2015-10-26 DIAGNOSIS — G2581 Restless legs syndrome: Secondary | ICD-10-CM

## 2015-10-26 DIAGNOSIS — R609 Edema, unspecified: Secondary | ICD-10-CM | POA: Diagnosis not present

## 2015-10-26 DIAGNOSIS — M79605 Pain in left leg: Secondary | ICD-10-CM

## 2015-10-26 MED ORDER — GABAPENTIN 300 MG PO CAPS: 300.0000 mg | ORAL_CAPSULE | Freq: Every day | ORAL | Status: AC

## 2015-10-26 NOTE — Patient Instructions (Signed)
We will keep your gabapentin the same: 300 mg each night.  Your exam is stable.

## 2015-10-26 NOTE — Progress Notes (Signed)
Subjective:    Patient ID: Melvin [name] is a 82 y.o. male.  HPI     Interim history:   Melvin [name] is a very pleasant 82 year old right-handed gentleman with an underlying complex medical history of type 2 diabetes, osteoporosis, lower extremity edema, left bundle branch block, heart palpitations, aortic stenosis, hypertension, and hyperlipidemia, CAD, s/p CABG in 10/98, who presents for followup consultation of his left leg pain, possible atypical RLS in conjunction with neuropathy. He is accompanied by his wife today. I last saw him on 04/27/2015, at which time he reported doing fairly well. Restless leg syndrome and pain were under reasonable control, swelling of lower extremities was stable. He felt could not walk very much. He had a LHC on 11/01/2014: moderate LV dysfunction with an ejection fraction of 35-40% with anterolateral hypocontractility.  There was mild aortic valve stenosis with 2+ angiographic aortic insufficiency.  There was severe native CAD with total occlusion of the LAD after the septal perforating artery, subtotal occlusion of the circumflex at the ostium with total proximal occlusion, and total occlusion of the RCA beyond the ostium.  He had a pain LIMA graft supplying the LAD.  There was a patent vein graft supplying the diagonal vessel.  He had a 95% focal for a proximal stenosis and a vein graft sequentially supplying his 2 marginal branches of the circumflex vessel.  The vein graft supplying the mid PDA was patent but had 30% narrowing at the anastomosis in a large distal RCA system. I suggested he continue with gabapentin 300 mg each night. He was encouraged to use a cane for safety and drink more water.  Today, 10/26/2015: He reports doing fairly well. Saw PA in cardiology on 10/09/15, and reported SOB, dizziness, had change of atenolol to metoprolol, due to palpitations reported as well and as BP was low, 100/50, hydralazine was decreased. As far as his leg pain, he  feels that he is stable. He tolerates the gabapentin, 300 mg each night.  Previously:   I saw him on 10/26/2014, at which time he reported recent chest pains. He had an echocardiogram on 05/10/14, which I reviewed: Normal LV size with mild LV hypertrophy. EF 35-40% with diffuse hypokinesis, septal-lateral dyssynchrony. Mild aortic stenosis, mild aortic insufficiency. Normal RV size with mildly decreased systolic function. He had b/l LE arterial doppler on 04/20/14, which I reviewed as well. He has PVD. He has LE swelling and is on a diuretic. As far as the leg pains, he felt fair. He felt the gabapentin was helpful. He felt it was not as the horizant, but at least affordable.   He was supposed to have a chest x-ray as well as a left heart catheterization. I suggested we continue with gabapentin 300 mg each night. I felt his lower extremity edema was a little worse.  I saw him on 03/15/2014, at which time he reported feeling fairly stable. However, he was taking the Horizant every other day due to cost issues. The Horizant had also helped his neck pain he felt. He had had some improvement in his symptoms with dopamine agonists including Requip and Mirapex but could not tolerate them longer-term. I felt his neuropathy was a little worse. I suggested we try generic gabapentin 300 mg qHS in lieu of the Horizant.   I saw him on 09/09/2013, at which time he reported feeling fairly stable, taking Horizant qod, to help it last longer d/t cost. He was able to drive to the appointment without having  to stop to walk around. He had an echo in 12/14, which showed EF of 35 to 40%. He was advised to continue with the current management. He was recently seen by his cardiologist on 01/24/2014, and I reviewed the notes by Dr. Claiborne Billings. He had his most recent echocardiogram in May 2015 which showed an EF of 35-40%, similar to the one in December 2014. He has a history of moderate aortic stenosis. His lipid profile has been  fine.  I saw him on 03/09/2013, at which time I felt his leg pain was do to a combination of peripheral neuropathy, peripheral vascular disease, chronic edema, and perhaps atypical RLS. He was unfortunately not able to tolerate Requip or Mirapex but had improvement in his pain with both dopamine agonists. Horizant at 300 mg has been somewhat helpful but he had mild morning grogginess.   I last saw him on 12/03/12, at which time he reported side effects from Mirapex even though it had actually helped his left leg pain, he had to stop it. On 09/01/2012 he reported side effects with a Requip, which made him groggy and he had to stop it. It had helped his leg pain, though.   I first met him on 07/21/2012 at which time a suggested blood work and a trial of Requip for the possibility of atypical RLS affecting only his left leg. He also has lower back pain and radiating pain. MRI lumbar spine in November 2012 did not show any structural lesions. He has had kidney cysts. 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 He has tried Neurontin. He has undergone epidural injections in 2013. At our last visit in 5/14 I suggested a trial of Horizant. He reported improvement in his night time pain and better sleep at night, but some daytime somnolence. He also worried about the cost of the medicine of over $120 per month. He had noted no other side effects.    His Past Medical History Is Significant For: Past Medical History  Diagnosis Date  . High cholesterol   . Unspecified hereditary and idiopathic peripheral neuropathy 10/01/2012  . RLS (restless legs syndrome) 10/01/2012  . Hypertension   . PAD (peripheral artery disease) ([place])   . PVC (premature ventricular contraction) 04/26/1997    on beta blocker/pt had cardio net monitor 06/23/2012  . LBBB (left bundle branch block)   . Heart murmur   . Coronary artery  disease   . Type II diabetes mellitus ([place])   . History of hiatal hernia   . GERD (gastroesophageal reflux disease)   . Arthritis     "just about all over my body"  . Osteoporosis   . Age-related macular degeneration, dry, both eyes     His Past Surgical History Is Significant For: Past Surgical History  Procedure Laterality Date  . Hiatal hernia repair    . Appendectomy    . Doppler echocardiography  05/18/2012    EF 62-26%, LV systolic function mildly reduced; borderline LA enlargment; mild mitral annular calcif, mild-mod MR; mild TR with normal RVSP; mild calcification of AV leaflets and mild-mod valvular AS with mild regurg      . Doppler echocardiography  08/19/2011    EF 50-55%, mod conc LVH; mild mitral annular calcif with mild MR; mild-mod TR; AV mildly sclerotic with mild valvular AS and mild regurg, mild aortic root dilatation  . Nm myocar perf ejection fraction  09/2011    lexiscan - no reversible ischmai, fixed anteroseptal defect r/t LBBB; EF 51%; septal hypokinesis; low risk but abnormal  . Lower extremity arterial doppler  02/2012    RLE - mild to mod arterial insuff; RSFA 50-69% diameter reduction; R pop 50-69% diameter reduction; posterior tibial (R) demonstrates occlusive disease; LSFA/prox pop 0-49% diameter reduction; L distal pop 50-69% diameter reduction; posterior tibial (L) demonstrates occlusive disease  . Lower extremity angiogram  02/05/2011    Diamondback orbital rotational atherectomy, percutaneous transluminal angioplasty of high-grade calcified popliteal & tibioperoneal stenosis (Dr. Adora Fridge)  . Coronary artery bypass graft  04/1997    CABGx5 - LIMA to LAD, SVG to PDA, SVG to PLA, VG to OM1, VG to 2nd diagonal (Dr. Roxan Hockey)  . Cardiac catheterization  04/26/1997    CAD - 50-60% prox to mid LAD with 70% stenosis in mid-distal, 70-80% 1st diag stenosis, 50-60% ostial stenosis in large 2nd diagonal; diffuse RCA stenosis with 40-50% osital narrowing with 70%  prox stenosis (Dr. Corky Downs) >> CABG  . Cardiac catheterization  11/01/2014  . Tonsillectomy    . Hernia repair    . Inguinal hernia repair Bilateral   . Cataract extraction w/ intraocular lens  implant, bilateral Bilateral   . Left and right heart catheterization with coronary angiogram N/A 11/01/2014    Procedure: LEFT AND RIGHT HEART CATHETERIZATION WITH CORONARY ANGIOGRAM;  Surgeon: Troy Sine, MD; EF35-40%, mild AS w/ 2+ AI, LAD, CFX, RCA 100%, LIMA-LAD OK, SVG-D1 OK, SVG-PDA 30%, SVG-OM 95%  . Percutaneous coronary stent intervention (pci-s) N/A 11/02/2014    Procedure: PERCUTANEOUS CORONARY STENT INTERVENTION (PCI-S);  Surgeon: Troy Sine, MD; 3.015 mm Resolute integrity DES to the SVG-OM     His Family History Is Significant For: Family History  Problem Relation Age of Onset  . CAD Mother   . Heart attack Father     His Social History Is Significant For: Social History   Social History  . Marital Status: Married    Spouse Name: Judson Roch  . Number of Children: 1  . Years of Education: College   Occupational History  .     Social History Main Topics  . Smoking status: Never Smoker   . Smokeless tobacco: Never Used  . Alcohol Use: No  . Drug Use: No  . Sexual Activity: No   Other Topics Concern  . None   Social History Narrative   Pt lives at home with his spouse.   Has been married 12yr.   Caffeine UBWI:OMBTDH is right handed    His Allergies Are:  Allergies  Allergen Reactions  . Other     Patient is allergic to something but doesn't know the name.  . Pramipexole   :   His Current Medications Are:  Outpatient Encounter Prescriptions as of 10/26/2015  Medication Sig  . amLODipine (NORVASC) 10 MG tablet Take 1 tablet (10 mg total) by mouth daily.  .Marland Kitchenaspirin 81 MG tablet Take 81 mg by mouth daily.  .Marland Kitchenatorvastatin (LIPITOR) 40 MG tablet Take 1 tablet (40 mg total) by mouth daily at 6 PM.  . clopidogrel (PLAVIX) 75 MG tablet Take 1 tablet (75 mg  total) by mouth daily.  .Marland Kitchengabapentin (NEURONTIN) 300 MG capsule Take 1 capsule (300 mg total) by mouth at bedtime.  . hydrALAZINE (APRESOLINE) 10 MG tablet Take 1 tablet (10 mg total) by mouth 3 (three) times daily.  . metformin (FORTAMET) 1000 MG (OSM) 24 hr tablet Take  1,000 mg by mouth 2 (two) times daily with a meal.  . metoprolol (LOPRESSOR) 50 MG tablet Take 1 tablet (50 mg total) by mouth 2 (two) times daily.  . Multiple Vitamins-Minerals (PRESERVISION AREDS 2 PO) Take 1 capsule by mouth 2 (two) times daily.   . multivitamin-iron-minerals-folic acid (CENTRUM) chewable tablet Chew 1 tablet by mouth daily.  . niacin (NIASPAN) 750 MG CR tablet Take 2 tablets (1,500 mg total) by mouth at bedtime.  Marland Kitchen NITROSTAT 0.4 MG SL tablet Place 0.4 mg under the tongue every 5 (five) minutes as needed for chest pain.   Marland Kitchen oxymetazoline (AFRIN) 0.05 % nasal spray Place 2 sprays into both nostrils 3 (three) times daily as needed for congestion (Allergies).  . polycarbophil (FIBERCON) 625 MG tablet Take 625 mg by mouth daily.  Marland Kitchen torsemide (DEMADEX) 20 MG tablet TAKE 1 TABLET TWICE A DAY  . zoledronic acid (RECLAST) 5 MG/100ML SOLN Inject 100 mLs (5 mg total) into the vein once.  . [DISCONTINUED] gabapentin (NEURONTIN) 300 MG capsule Take 1 capsule (300 mg total) by mouth at bedtime.   No facility-administered encounter medications on file as of 10/26/2015.  :  Review of Systems:  Out of a complete 14 point review of systems, all are reviewed and negative with the exception of these symptoms as listed below:   Review of Systems  Neurological:       No new concerns per patient. He states that he is doing "the same".     Objective:  Neurologic Exam  Physical Exam Physical Examination:   Filed Vitals:   10/26/15 1126  BP: 132/62  Pulse: 62  Resp: 16   General Examination: The patient is a very pleasant 82 y.o. male in no acute distress. He is in good spirits today.  HEENT exam: Normocephalic,  atraumatic, face is symmetric with normal facial animation noted. Neck is supple with full range of motion. Hearing is grossly intact. Pupils are equal, round and reactive to light and accommodation. Funduscopy is difficult b/l. Extraocular tracking is good without nystagmus. Speech is clear. Oropharynx is clear. He has moderate mouth dryness. He has normal airway findings. Tongue protrudes centrally and palate elevates symmetrically. There is no lip, neck or jaw tremor.   Chest is clear to auscultation, with no wheezing, crackles or rhonchi noted.  Heart sounds are normal with the exception of a 0/9-3/2 systolic murmur audible, unchanged.  Abdomen soft, nontender with positive bowel sounds.  Skin examination reveals skin to be warm and dry. There are hyperpigmentations distal lower extremities and scars from prior vein harvesting.  he also has sun exposure related changes on his scalp. He has hyperkeratosis on his nose.  Extremities: He has 1 to 2+ pitting edema in the distal lower extremities up to upper shin areas bilaterally, stable for me.   Neurologically: Mental status: The patient is awake, alert and oriented in all 3 spheres. His memory, attention, language and knowledge are appropriate. Cranial nerves are as described under HEENT exam. Motor-wise he has normal bulk strength and tone throughout with the exception of 4+ out of 5 weakness in his left hip flexor, unchanged. Reflexes are 1+ in the upper extremities trace in both knees and absent in both ankles, all unchanged. Sensory exam reveals decreased sensation to vibration, pinprick and temperature sense in both distal lower extremities left worse than right, b/l up to mid shin areas. He has normal sensation in his hands and face. He has no drift and no tremor. Romberg  testing reveals mild swaying but no corrective steps. His gait, station and balance are unremarkable for age. He does walk cautiously and turns slowly, he has no walking aid.     Assessment and Plan:   In summary, Melvin [name] is a 82 yo gentleman with an underlying complex medical history of type 2 diabetes, osteoporosis, lower extremity edema, left bundle branch block, heart palpitations, aortic stenosis, hypertension, and hyperlipidemia, CAD, s/p CABG in 10/98, who presents for followup consultation of his approximately 4 year history of leg pain, L more than R, which is, most likely due to multiple issues: peripheral neuropathy, PVD, chronic leg edema and RLS. He was not able to tolerate Requip or Mirapex, and while he had improvement in his pain with both DAs, he had some SEs and while Horizant was helpful, he could not afford it and took it qod for a while, after which we switched to generic gabapentin 300 mg each night in lieu of Horizant. Gabapentin has been helpful and he tolerates it. He has had issues in the last 6+ months with CP and SOB and had a LHC with follow up with cardiology pending soon. He has had some changes to his medications for the heart. I suggested we continue with the gabapentin at the current dose of 300 mg each night, I renewed his 90 day prescription. Thankfully he has been able to tolerate it and it is helpful. His exam is stable. His lower extremity edema seems stable.  I would like to see him back in 6 months from now, sooner if the need arises and asked him to call us with any interim questions, concerns, problems, updates or refill requests. He and his wife were in agreement. I spent 20 minutes in total face-to-face time with the patient, more than 50% of which was spent in counseling and coordination of care, reviewing test results, reviewing medication and discussing or reviewing the diagnosis of leg pain, RLS, the prognosis and treatment options.

## 2015-10-30 ENCOUNTER — Telehealth: Payer: Self-pay | Admitting: *Deleted

## 2015-10-30 ENCOUNTER — Ambulatory Visit
Admission: RE | Admit: 2015-10-30 | Discharge: 2015-10-30 | Disposition: A | Discharge status: Home / Self Care | Payer: Medicare Other | Source: Ambulatory Visit | Attending: Physician Assistant | Admitting: Physician Assistant

## 2015-10-30 ENCOUNTER — Encounter: Payer: Self-pay | Admitting: Physician Assistant

## 2015-10-30 ENCOUNTER — Ambulatory Visit (INDEPENDENT_AMBULATORY_CARE_PROVIDER_SITE_OTHER): Payer: Medicare Other | Admitting: Physician Assistant

## 2015-10-30 VITALS — BP 132/70 | HR 56 | Ht 66.0 in | Wt 175.0 lb

## 2015-10-30 DIAGNOSIS — I350 Nonrheumatic aortic (valve) stenosis: Secondary | ICD-10-CM | POA: Diagnosis not present

## 2015-10-30 DIAGNOSIS — R0602 Shortness of breath: Secondary | ICD-10-CM

## 2015-10-30 DIAGNOSIS — I2510 Atherosclerotic heart disease of native coronary artery without angina pectoris: Secondary | ICD-10-CM | POA: Diagnosis not present

## 2015-10-30 DIAGNOSIS — I5022 Chronic systolic (congestive) heart failure: Secondary | ICD-10-CM | POA: Diagnosis not present

## 2015-10-30 DIAGNOSIS — I2583 Coronary atherosclerosis due to lipid rich plaque: Secondary | ICD-10-CM

## 2015-10-30 NOTE — Telephone Encounter (Signed)
Raiann from [facility] calling for diagnosis codes for BNP - not approved for code given.  Gave codes for SOB and lower extremity edema, advised to call if these did not work.

## 2015-10-30 NOTE — Progress Notes (Signed)
Cardiology Office Note   Date:  10/30/2015   ID:  Melvin, [name] 11-10-1927, MRN [id number]  PCP:  Colette Ribas, MD  Cardiologist:  Dr Venora Maples, PA-C   Chief Complaint  Patient presents with  . Follow-up    "pounding of the heart" with palpitations,has taken nitro one time since last ov,intermitten SOB at rest/activity,mild lower extremitiy swelling.    History of Present Illness: Melvin [name] is a 82 y.o. male with a history of DM2, LBBB, mild AS, HTN, HLD, CABG 1998, ICM w/ EF 37% by MV 09/2014.  Seen in the office 04/03 for palpitations, presyncope and edema. Hydralazine was decreased and atenolol changed to metoprolol. Dr Tresa Endo reviewed data and felt Melvin [name] would be a TAVR candidate, if his AS has progressed and he wished to proceed.   Melvin [name] presents for Follow-up.  His presyncope has improved. He will get lightheaded if he bends over and then stands up, but he is not having geting lightheaded or dizzy with every position change as he was previously.   He does not generally wake up with very much lower extremity edema, but it does develop during the day. He is compliant with his torsemide and tries to eat a low-sodium diet. He does not feel that he is over drinking.  The dyspnea on exertion and fatigue has not changed much.  His heart continues to pound at times. There are varying degrees of this. Sometimes it pounds very hard, sometimes it pounds just a little as it is doing right now (regular rhythm, normal rate). He does not know what his blood pressure is when his heart is pounding, but admits it may be elevated.   He has been tracking his blood pressure and heart rate on his home blood pressure machine. The highest systolic blood pressure is 161, the lowest heart rate 57. Diastolic blood pressures are generally 60-85. The 2 highest blood pressures are during a period of time that he feels his heart may have been  pounding. He does not remember being particularly short of breath that day.  In discussing the possibility of TAVR, his main concern is how to care for his wife. He is interested in being aggressive with medications or procedures if that will help him be better. He is interested in having less dyspnea on exertion and being less fatigued.   Past Medical History  Diagnosis Date  . High cholesterol   . Unspecified hereditary and idiopathic peripheral neuropathy 10/01/2012  . RLS (restless legs syndrome) 10/01/2012  . Hypertension   . PAD (peripheral artery disease) (HCC)   . PVC (premature ventricular contraction) 04/26/1997    on beta blocker/pt had cardio net monitor 06/23/2012  . LBBB (left bundle branch block)   . Aortic stenosis   . Coronary artery disease   . Type II diabetes mellitus (HCC)   . History of hiatal hernia   . GERD (gastroesophageal reflux disease)   . Arthritis     "just about all over my body"  . Osteoporosis   . Age-related macular degeneration, dry, both eyes     Past Surgical History  Procedure Laterality Date  . Hiatal hernia repair    . Appendectomy    . Doppler echocardiography  05/18/2012    EF 45-50%, LV systolic function mildly reduced; borderline LA enlargment; mild mitral annular calcif, mild-mod Melvin; mild TR with normal RVSP; mild calcification of AV leaflets and mild-mod valvular AS with  mild regurg      . Doppler echocardiography  08/19/2011    EF 50-55%, mod conc LVH; mild mitral annular calcif with mild Melvin; mild-mod TR; AV mildly sclerotic with mild valvular AS and mild regurg, mild aortic root dilatation  . Nm myocar perf ejection fraction  09/2011    lexiscan - no reversible ischmai, fixed anteroseptal defect r/t LBBB; EF 51%; septal hypokinesis; low risk but abnormal  . Lower extremity arterial doppler  02/2012    RLE - mild to mod arterial insuff; RSFA 50-69% diameter reduction; R pop 50-69% diameter reduction; posterior tibial (R) demonstrates  occlusive disease; LSFA/prox pop 0-49% diameter reduction; L distal pop 50-69% diameter reduction; posterior tibial (L) demonstrates occlusive disease  . Lower extremity angiogram  02/05/2011    Diamondback orbital rotational atherectomy, percutaneous transluminal angioplasty of high-grade calcified popliteal & tibioperoneal stenosis (Dr. Erlene Quan)  . Coronary artery bypass graft  04/1997    CABGx5 - LIMA to LAD, SVG to PDA, SVG to PLA, VG to OM1, VG to 2nd diagonal (Dr. Dorris Fetch)  . Cardiac catheterization  04/26/1997    CAD - 50-60% prox to mid LAD with 70% stenosis in mid-distal, 70-80% 1st diag stenosis, 50-60% ostial stenosis in large 2nd diagonal; diffuse RCA stenosis with 40-50% osital narrowing with 70% prox stenosis (Dr. Bishop Limbo) >> CABG  . Cardiac catheterization  11/01/2014  . Tonsillectomy    . Hernia repair    . Inguinal hernia repair Bilateral   . Cataract extraction w/ intraocular lens  implant, bilateral Bilateral   . Left and right heart catheterization with coronary angiogram N/A 11/01/2014    Procedure: LEFT AND RIGHT HEART CATHETERIZATION WITH CORONARY ANGIOGRAM;  Surgeon: Lennette Bihari, MD; EF35-40%, mild AS w/ 2+ AI, LAD, CFX, RCA 100%, LIMA-LAD OK, SVG-D1 OK, SVG-PDA 30%, SVG-OM 95%  . Percutaneous coronary stent intervention (pci-s) N/A 11/02/2014    Procedure: PERCUTANEOUS CORONARY STENT INTERVENTION (PCI-S);  Surgeon: Lennette Bihari, MD; 3.015 mm Resolute integrity DES to the SVG-OM     Current Outpatient Prescriptions  Medication Sig Dispense Refill  . amLODipine (NORVASC) 10 MG tablet Take 1 tablet (10 mg total) by mouth daily. 30 tablet 3  . aspirin 81 MG tablet Take 81 mg by mouth daily.    Marland Kitchen atorvastatin (LIPITOR) 40 MG tablet Take 1 tablet (40 mg total) by mouth daily at 6 PM. 30 tablet 3  . clopidogrel (PLAVIX) 75 MG tablet Take 1 tablet (75 mg total) by mouth daily. 90 tablet 3  . gabapentin (NEURONTIN) 300 MG capsule Take 1 capsule (300 mg total) by mouth  at bedtime. 90 capsule 3  . hydrALAZINE (APRESOLINE) 10 MG tablet Take 1 tablet (10 mg total) by mouth 3 (three) times daily. 270 tablet 3  . metformin (FORTAMET) 1000 MG (OSM) 24 hr tablet Take 1,000 mg by mouth 2 (two) times daily with a meal.    . metoprolol (LOPRESSOR) 50 MG tablet Take 1 tablet (50 mg total) by mouth 2 (two) times daily. 60 tablet 0  . Multiple Vitamins-Minerals (PRESERVISION AREDS 2 PO) Take 1 capsule by mouth 2 (two) times daily.     . multivitamin-iron-minerals-folic acid (CENTRUM) chewable tablet Chew 1 tablet by mouth daily.    . niacin (NIASPAN) 750 MG CR tablet Take 2 tablets (1,500 mg total) by mouth at bedtime. 180 tablet 0  . NITROSTAT 0.4 MG SL tablet Place 0.4 mg under the tongue every 5 (five) minutes as needed for chest pain.     Marland Kitchen  oxymetazoline (AFRIN) 0.05 % nasal spray Place 2 sprays into both nostrils 3 (three) times daily as needed for congestion (Allergies).    . polycarbophil (FIBERCON) 625 MG tablet Take 625 mg by mouth daily.    Marland Kitchen. torsemide (DEMADEX) 20 MG tablet TAKE 1 TABLET TWICE A DAY 180 tablet 2  . zoledronic acid (RECLAST) 5 MG/100ML SOLN Inject 100 mLs (5 mg total) into the vein once. 100 mL 0   No current facility-administered medications for this visit.    Allergies:   Other and Pramipexole    Social History:  The patient  reports that he has never smoked. He has never used smokeless tobacco. He reports that he does not drink alcohol or use illicit drugs.   Family History:  The patient's family history includes CAD in his mother; Heart attack in his father.    ROS:  Please see the history of present illness. All other systems are reviewed and negative.    PHYSICAL EXAM: VS:  BP 132/70 mmHg  Pulse 56  Ht 5\' 6"  (1.676 m)  Wt 175 lb (79.379 kg)  BMI 28.26 kg/m2 , BMI Body mass index is 28.26 kg/(m^2). GEN: Well nourished, well developed, male in no acute distress HEENT: normal for age  Neck: JVD 8 cm, +hepatojugular reflux, no  carotid bruit, no masses Cardiac: RRR; muffled S2, 2/6 murmur, no rubs, or gallops Respiratory: Decreased breath sounds bases with rales, left >> right. Slightly increased work of breathing GI: soft, nontender, nondistended, + BS MS: no deformity or atrophy; 1+ LE edema; distal pulses are 2+ in all 4 extremities  Skin: warm and dry, no rash Neuro:  Strength and sensation are intact Psych: euthymic mood, full affect   EKG:  EKG is not ordered today.  Recent Labs: 11/16/2014: Hemoglobin 11.8*; Platelets 175.0 06/26/2015: ALT 15; BUN 16; Creatinine, Ser 0.85; Potassium 4.8; Sodium 136    Lipid Panel    Component Value Date/Time   CHOL 135 09/28/2014 0933   TRIG 77 09/28/2014 0933   HDL 48 09/28/2014 0933   LDLCALC 72 09/28/2014 0933     Wt Readings from Last 3 Encounters:  10/30/15 175 lb (79.379 kg)  10/26/15 175 lb (79.379 kg)  10/09/15 175 lb (79.379 kg)     Other studies Reviewed:  Additional studies/ records that were reviewed today include: Previous office notes and testing.  ASSESSMENT AND PLAN:  1.  Aortic stenosis: We will get an echocardiogram to further evaluate this. He will think about his health and his family situation. He is inclined to pursue TAVR if it is available to him, but he'll have to make sure his wife can be cared for.  2. Chronic systolic CHF: His weight is exactly the same. He thinks he has lost a little on his home scales. He is having some lower extremity edema but according to him, it is mostly during the day. His dyspnea on exertion has not changed much recently. He understands that I have not been aggressively pursuing diuresis because I am concerned about his valve. However, we will check a chest x-ray and BNP today.   Current medicines are reviewed at length with the patient today.  The patient does not have concerns regarding medicines.  The following changes have been made:  no change  Labs/ tests ordered today include:   Orders Placed  This Encounter  Procedures  . DG Chest 2 View  . Basic metabolic panel  . Brain natriuretic peptide  . ECHOCARDIOGRAM COMPLETE  Disposition:   FU with Dr. Tresa Endo  Signed, Ardean Melroy, Bjorn Loser, PA-C  10/30/2015 2:00 PM    [facility] Phone: [phone]; Fax: [phone]  This note was written with the assistance of speech recognition software. Please excuse any transcriptional errors.

## 2015-10-30 NOTE — Patient Instructions (Addendum)
Medication Instructions:  Your physician recommends that you continue on your current medications as directed. Please refer to the Current Medication list given to you today.   Labwork: Your physician recommends that you return for lab work TODAY. The lab can be found on the FIRST FLOOR of out building in [address]    Testing/Procedures: Your physician has requested that you have an echocardiogram. Echocardiography is a painless test that uses sound waves to create images of your heart. It provides your doctor with information about the size and shape of your heart and how well your heart's chambers and valves are working. This procedure takes approximately one hour. There are no restrictions for this procedure.  A chest x-ray takes a picture of the organs and structures inside the chest, including the heart, lungs, and blood vessels. This test can show several things, including, whether the heart is enlarges; whether fluid is building up in the lungs; and whether pacemaker / defibrillator leads are still in place.  Located at [facility] at [address] [place], [place], [place] [id number] for chest xray.   Follow-Up: Your physician recommends that you schedule a follow-up appointment with Dr Tresa EndoKelly or Theodore Demarkhonda Barrett after the echo is scheduled.   Any Other Special Instructions Will Be Listed Below (If Applicable).     If you need a refill on your cardiac medications before your next appointment, please call your pharmacy.

## 2015-10-31 LAB — BASIC METABOLIC PANEL
BUN: 14 mg/dL (ref 7–25)
CALCIUM: 10 mg/dL (ref 8.6–10.3)
CO2: 30 mmol/L (ref 20–31)
Chloride: 95 mmol/L — ABNORMAL LOW (ref 98–110)
Creat: 0.91 mg/dL (ref 0.70–1.11)
GLUCOSE: 101 mg/dL — AB (ref 65–99)
Potassium: 5.1 mmol/L (ref 3.5–5.3)
Sodium: 137 mmol/L (ref 135–146)

## 2015-10-31 LAB — BRAIN NATRIURETIC PEPTIDE: BRAIN NATRIURETIC PEPTIDE: 105.9 pg/mL — AB (ref <100)

## 2015-11-20 ENCOUNTER — Other Ambulatory Visit: Payer: Self-pay | Admitting: *Deleted

## 2015-11-20 MED ORDER — CLOPIDOGREL BISULFATE 75 MG PO TABS: 75.0000 mg | ORAL_TABLET | Freq: Every day | ORAL | Status: DC

## 2015-11-20 MED ORDER — ATORVASTATIN CALCIUM 40 MG PO TABS: 40.0000 mg | ORAL_TABLET | Freq: Every day | ORAL | Status: DC

## 2015-11-20 MED ORDER — AMLODIPINE BESYLATE 10 MG PO TABS: 10.0000 mg | ORAL_TABLET | Freq: Every day | ORAL | Status: DC

## 2015-11-21 ENCOUNTER — Other Ambulatory Visit: Payer: Self-pay

## 2015-11-21 ENCOUNTER — Ambulatory Visit (HOSPITAL_COMMUNITY): Payer: Medicare Other | Attending: Cardiology

## 2015-11-21 DIAGNOSIS — R002 Palpitations: Secondary | ICD-10-CM | POA: Insufficient documentation

## 2015-11-21 DIAGNOSIS — I352 Nonrheumatic aortic (valve) stenosis with insufficiency: Secondary | ICD-10-CM | POA: Diagnosis not present

## 2015-11-21 DIAGNOSIS — E119 Type 2 diabetes mellitus without complications: Secondary | ICD-10-CM | POA: Insufficient documentation

## 2015-11-21 DIAGNOSIS — I119 Hypertensive heart disease without heart failure: Secondary | ICD-10-CM | POA: Diagnosis not present

## 2015-11-21 DIAGNOSIS — R000 Tachycardia, unspecified: Secondary | ICD-10-CM | POA: Diagnosis not present

## 2015-11-21 DIAGNOSIS — I447 Left bundle-branch block, unspecified: Secondary | ICD-10-CM | POA: Insufficient documentation

## 2015-11-21 DIAGNOSIS — I340 Nonrheumatic mitral (valve) insufficiency: Secondary | ICD-10-CM | POA: Insufficient documentation

## 2015-11-21 DIAGNOSIS — E785 Hyperlipidemia, unspecified: Secondary | ICD-10-CM | POA: Insufficient documentation

## 2015-11-21 DIAGNOSIS — I77810 Thoracic aortic ectasia: Secondary | ICD-10-CM | POA: Diagnosis not present

## 2015-11-21 DIAGNOSIS — I350 Nonrheumatic aortic (valve) stenosis: Secondary | ICD-10-CM | POA: Diagnosis present

## 2015-11-29 ENCOUNTER — Encounter: Payer: Self-pay | Admitting: Cardiovascular Disease

## 2015-11-29 ENCOUNTER — Ambulatory Visit (INDEPENDENT_AMBULATORY_CARE_PROVIDER_SITE_OTHER): Payer: Medicare Other | Admitting: Cardiovascular Disease

## 2015-11-29 VITALS — BP 152/67 | HR 51 | Ht 66.0 in | Wt 176.2 lb

## 2015-11-29 DIAGNOSIS — E7800 Pure hypercholesterolemia, unspecified: Secondary | ICD-10-CM

## 2015-11-29 DIAGNOSIS — I447 Left bundle-branch block, unspecified: Secondary | ICD-10-CM

## 2015-11-29 DIAGNOSIS — I25719 Atherosclerosis of autologous vein coronary artery bypass graft(s) with unspecified angina pectoris: Secondary | ICD-10-CM

## 2015-11-29 DIAGNOSIS — I2510 Atherosclerotic heart disease of native coronary artery without angina pectoris: Secondary | ICD-10-CM | POA: Diagnosis not present

## 2015-11-29 DIAGNOSIS — I350 Nonrheumatic aortic (valve) stenosis: Secondary | ICD-10-CM

## 2015-11-29 DIAGNOSIS — R002 Palpitations: Secondary | ICD-10-CM | POA: Diagnosis not present

## 2015-11-29 MED ORDER — HYDRALAZINE HCL 10 MG PO TABS: 20.0000 mg | ORAL_TABLET | Freq: Three times a day (TID) | ORAL | Status: DC

## 2015-11-29 MED ORDER — NITROGLYCERIN 0.4 MG SL SUBL: 0.4000 mg | SUBLINGUAL_TABLET | SUBLINGUAL | Status: AC | PRN

## 2015-11-29 MED ORDER — AMLODIPINE BESYLATE 10 MG PO TABS: 5.0000 mg | ORAL_TABLET | Freq: Every day | ORAL | Status: DC

## 2015-11-29 MED ORDER — TORSEMIDE 20 MG PO TABS: ORAL_TABLET | ORAL | Status: DC

## 2015-11-29 NOTE — Patient Instructions (Addendum)
Your physician recommends that you schedule a follow-up appointment in:  2 months with Dr Tresa EndoKelly.  Your physician has recommended you make the following change in your medication:   1.) the torsemide has been increased to 2 tablets in the morning and 1 tablet in the evening.  2.) the hydralazine has been changed to 2 tablets 3 times a day.  3.) the amlodipine has been decreased to 1/2 tablet daily.

## 2015-12-03 ENCOUNTER — Encounter: Payer: Self-pay | Admitting: Cardiovascular Disease

## 2015-12-03 NOTE — Progress Notes (Signed)
Patient ID: Melvin [name], male   DOB: 24-Nov-1927, 82 y.o.   MRN: [id number]    HPI: Melvin [name] is a 82 y.o. male who presents for a 6 month follow-up cardiology evaluation.  [name] has a history of ventricular ectopy and has been on beta blocker therapy with ectopy suppression since the late 1980s . He has CAD and underwent PTCA of his LAD  in 1991. In October 1998 he underwent CABG surgery x5 by Dr. Roxan Hockey (LIMA to LAD, sequential vein to the PDA and PLA, vein to the OM 1, vein to the second diagonal vessel). He has chronic left bundle branch block, and also has been followed for aortic valve stenosis with aortic insufficiency which previously has been in the mild to moderate range. He has peripheral vascular disease and underwent rotational atherectomy of his left popliteal and tibial trunk and angioplasty of focal popliteal lesions. In November 2013 an echo showed an ejection fraction of 40-98% with diastolic dysfunction grade 1. He was felt to have mild to moderate aortic stenosis with mean gradient of 14, maximum gradient of 24, and valve area calculated at 1.2 cm with mild aortic insufficiency.  In December 2013  a CardioNet monitor showed frequent ventricular ectopy without any episodes of ventricular tachycardia but he did have multifocal ectopic complexes. He had been under significant stress in caring for his wife who has has significant health issues. On 06/10/2013 a followup echo Doppler study  showed an ejection fraction of 35-40%. He had abnormal septal motion consistent with dyssynergy due to his bundle branch block. The report was interpreted as mild aortic stenosis and he had a mean gradient of 17 and peak gradient of 29 with a valve area of 1.5 cm. He did have mild to moderate aortic insufficiency. There is mild-to-moderate tricuspid regurgitation left atrium was significantly dilated and he had mild/moderate tricuspid regurgitation.  A follow-up echo  on 11/15/2013 showed an ejection fraction of approximately 35- 40% which was unchanged from 06/2013.  There was mild LVH with mild focal basal hypertrophy of the septum.  Systolic function was moderately reduced.  He was felt to have moderate aortic stenosis with mild AR.  Mean gradient was 7 mm, and peak transvalvular aortic gradient was 31 mm.  However, because of his moderate LV dysfunction, aortic valve area was 1.14 cm, compatible with moderate gas.  There is also mild mitral regurgitation.  He ventricular dyssynergy due to his chronic left bundle branch block.  Estimated PA pressure was 37 mm.  A six-month follow-up echo Doppler study on 05/10/2014 continued to show an ejection fraction of 35-40% with diffuse hypokinesis and septal lateral dyssynchrony consistent with his left bundle branch block.  On this echo he was felt to have only mild aortic stenosis with a mean gradient of 15 mm and a valve area of 1.6 cm.  His aorta had mild dilatation.  PA pressure was normal.  There was moderate left atrial dilatation.  He underwent a lower Doppler study on 04/20/2014 which  showed an ABI of 0.621 on the right.  He has one-vessel runoff of the peroneal artery and the posterior and anterior tibial arteries are known to be occluded.  This was not significantly changed from his prior study.  Last year, he had experienced two  episode of chest pressure which radiated to his shoulders and arms.  He took nitroglycerin with relief.  He had another episode of chest pain several days later.  I scheduled him  for a nuclear perfusion study  on 10/05/2014 which revealed an ejection fraction at 37%.  The study was interpreted as high risk with new lateral wall ischemia.  In addition to his moderate LV dysfunction.  In addition, there is a fixed septal defect likely related to his left bundle branch block.  I performed a right and left heart cardiac catheterization on 11/01/2014.  He had moderate LV dysfunction with an  ejection fraction of 35-40% with anterolateral hypocontractility.  There was mild aortic valve stenosis with 2+ angiographic aortic insufficiency.  There was severe native CAD with total occlusion of the LAD after the septal perforating artery, subtotal occlusion of the circumflex at the ostium with total proximal occlusion, and total occlusion of the RCA beyond the ostium.  He had a patent LIMA graft supplying the LAD.  There was a patent vein graft supplying the diagonal vessel.  He had a 95% focal for a proximal stenosis and a vein graft sequentially supplying his 2 marginal branches of the circumflex vessel.  The vein graft supplying the mid PDA was patent but had 30% narrowing at the anastomosis in a large distal RCA system.  He was hydrated following the procedure, and the following day underwent successful intervention to the saphenous vein graft supplying the circumflex marginal system with the 99% stenosis being reduced to 0% and insertion of a 3.015 mm Resolute integrity DES stent postdilated to 3.25 mm.  When I saw him in follow-up of his catheterization, he was feeling significantly significantly improved.  His shortness of breath improved and he denied any exertional type chest pain. I was also concerned about progressive dermatologic lesions, particularly on his scalp.  I strongly urged that he follow-up with Dr. Nevada Crane.  We had seen remotely for dermatologic evaluation.  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  He continues to experience occasional episodes of palpitations and for the past 2 nights to an extra atenolol 50 mg with resolution of symptoms.    As I last saw him, he was seen by Rosaria Ferries last month.  In early April, she had changed his atenolol to metoprolol but he felt he did not feel as well with this and has reverted back to taking atenolol on a daily basis.  He had experienced some previous palpitations  and presyncope.  He underwent a follow-up echo Doppler study on 11/21/2015.  This showed an EF of 30-35% with moderate LVH.  There was diffuse hypokinesis with grade 1 diastolic dysfunction.  He was felt to have moderate aortic stenosis with a mean gradient of 22 mm and aortic valve area of 1.1 cm.  There was mild AR, mild MR, moderate left atrial enlargement, mild right atrial enlargement with mild right ventricular enlargement.  There was mild TR with moderately elevated pulmonary pressure at 46 mm.  I had seen him last week when he was in with his wife.  He mentioned his echo and I worked him in and seeing him in follow-up today.  He admits to being weak.  He still notes occasional palpitations.  He has been under increased stress, caring for his wife.  He states that he did not tolerate the metoprolol but feels like the palpitations are better controlled with atenolol.  He continues to have lower extremity edema.  He previously denies any episodes of recent presyncope or chest tightness.  Past Medical History  Diagnosis Date  . High  cholesterol   . Unspecified hereditary and idiopathic peripheral neuropathy 10/01/2012  . RLS (restless legs syndrome) 10/01/2012  . Hypertension   . PAD (peripheral artery disease) ([place])   . PVC (premature ventricular contraction) 04/26/1997    on beta blocker/pt had cardio net monitor 06/23/2012  . LBBB (left bundle branch block)   . Aortic stenosis   . Coronary artery disease   . Type II diabetes mellitus ([place])   . History of hiatal hernia   . GERD (gastroesophageal reflux disease)   . Arthritis     "just about all over my body"  . Osteoporosis   . Age-related macular degeneration, dry, both eyes     Past Surgical History  Procedure Laterality Date  . Hiatal hernia repair    . Appendectomy    . Doppler echocardiography  05/18/2012    EF 22-97%, LV systolic function mildly reduced; borderline LA enlargment; mild mitral annular calcif, mild-mod MR; mild  TR with normal RVSP; mild calcification of AV leaflets and mild-mod valvular AS with mild regurg      . Doppler echocardiography  08/19/2011    EF 50-55%, mod conc LVH; mild mitral annular calcif with mild MR; mild-mod TR; AV mildly sclerotic with mild valvular AS and mild regurg, mild aortic root dilatation  . Nm myocar perf ejection fraction  09/2011    lexiscan - no reversible ischmai, fixed anteroseptal defect r/t LBBB; EF 51%; septal hypokinesis; low risk but abnormal  . Lower extremity arterial doppler  02/2012    RLE - mild to mod arterial insuff; RSFA 50-69% diameter reduction; R pop 50-69% diameter reduction; posterior tibial (R) demonstrates occlusive disease; LSFA/prox pop 0-49% diameter reduction; L distal pop 50-69% diameter reduction; posterior tibial (L) demonstrates occlusive disease  . Lower extremity angiogram  02/05/2011    Diamondback orbital rotational atherectomy, percutaneous transluminal angioplasty of high-grade calcified popliteal & tibioperoneal stenosis (Dr. Adora Fridge)  . Coronary artery bypass graft  04/1997    CABGx5 - LIMA to LAD, SVG to PDA, SVG to PLA, VG to OM1, VG to 2nd diagonal (Dr. Roxan Hockey)  . Cardiac catheterization  04/26/1997    CAD - 50-60% prox to mid LAD with 70% stenosis in mid-distal, 70-80% 1st diag stenosis, 50-60% ostial stenosis in large 2nd diagonal; diffuse RCA stenosis with 40-50% osital narrowing with 70% prox stenosis (Dr. Corky Downs) >> CABG  . Cardiac catheterization  11/01/2014  . Tonsillectomy    . Hernia repair    . Inguinal hernia repair Bilateral   . Cataract extraction w/ intraocular lens  implant, bilateral Bilateral   . Left and right heart catheterization with coronary angiogram N/A 11/01/2014    Procedure: LEFT AND RIGHT HEART CATHETERIZATION WITH CORONARY ANGIOGRAM;  Surgeon: Troy Sine, MD; EF35-40%, mild AS w/ 2+ AI, LAD, CFX, RCA 100%, LIMA-LAD OK, SVG-D1 OK, SVG-PDA 30%, SVG-OM 95%  . Percutaneous coronary stent intervention  (pci-s) N/A 11/02/2014    Procedure: PERCUTANEOUS CORONARY STENT INTERVENTION (PCI-S);  Surgeon: Troy Sine, MD; 3.015 mm Resolute integrity DES to the SVG-OM     Allergies  Allergen Reactions  . Other     Patient is allergic to something but doesn't know the name.  . Pramipexole Swelling    Current Outpatient Prescriptions  Medication Sig Dispense Refill  . amLODipine (NORVASC) 10 MG tablet Take 0.5 tablets (5 mg total) by mouth daily. KEEP OV. 90 tablet 0  . aspirin 81 MG tablet Take 81 mg by mouth daily.    Marland Kitchen  atenolol (TENORMIN) 100 MG tablet Take 100 mg by mouth 2 (two) times daily.    Marland Kitchen atorvastatin (LIPITOR) 40 MG tablet Take 1 tablet (40 mg total) by mouth daily at 6 PM. KEEP OV. 90 tablet 0  . clopidogrel (PLAVIX) 75 MG tablet Take 1 tablet (75 mg total) by mouth daily. KEEP OV. 90 tablet 0  . gabapentin (NEURONTIN) 300 MG capsule Take 1 capsule (300 mg total) by mouth at bedtime. 90 capsule 3  . hydrALAZINE (APRESOLINE) 10 MG tablet Take 2 tablets (20 mg total) by mouth 3 (three) times daily. 270 tablet 3  . metformin (FORTAMET) 1000 MG (OSM) 24 hr tablet Take 1,000 mg by mouth 2 (two) times daily with a meal.    . Multiple Vitamins-Minerals (PRESERVISION AREDS 2 PO) Take 1 capsule by mouth 2 (two) times daily.     . multivitamin-iron-minerals-folic acid (CENTRUM) chewable tablet Chew 1 tablet by mouth daily.    . niacin (NIASPAN) 750 MG CR tablet Take 2 tablets (1,500 mg total) by mouth at bedtime. 180 tablet 0  . nitroGLYCERIN (NITROSTAT) 0.4 MG SL tablet Place 1 tablet (0.4 mg total) under the tongue every 5 (five) minutes as needed for chest pain. 25 tablet 3  . oxymetazoline (AFRIN) 0.05 % nasal spray Place 2 sprays into both nostrils 3 (three) times daily as needed for congestion (Allergies).    . polycarbophil (FIBERCON) 625 MG tablet Take 625 mg by mouth daily.    Marland Kitchen torsemide (DEMADEX) 20 MG tablet Take 2 tablets in the the morning and 1 tablet. 180 tablet 2  .  zoledronic acid (RECLAST) 5 MG/100ML SOLN Inject 100 mLs (5 mg total) into the vein once. 100 mL 0   No current facility-administered medications for this visit.    Socially he is married, retired, and has one child.  He does not routinely exercise. There is no tobacco or alcohol use.  ROS General: Negative; No fevers, chills, or night sweats;  HEENT: Negative; No changes in vision or hearing, sinus congestion, difficulty swallowing Pulmonary: Negative; No cough, wheezing, shortness of breath, hemoptysis Cardiovascular: See HPI;  GI: Negative; No nausea, vomiting, diarrhea, or abdominal pain GU: Negative; No dysuria, hematuria, or difficulty voiding Musculoskeletal: Negative; no myalgias, joint pain, or weakness Hematologic/Oncology: Negative; no easy bruising, bleeding Endocrine: Negative; no heat/cold intolerance; no diabetes Neuro: Negative; no changes in balance, headaches Skin: Positive for fungating scalp lesion Psychiatric: Negative; No behavioral problems, depression Sleep: Negative; No snoring, daytime sleepiness, hypersomnolence, bruxism, restless legs, hypnogognic hallucinations, no cataplexy Other comprehensive 14 point system review is negative.   PE BP 152/67 mmHg  Pulse 51  Ht '5\' 6"'$  (1.676 m)  Wt 176 lb 3.2 oz (79.924 kg)  BMI 28.45 kg/m2   Repeat blood pressure 138/74 without orthostatic change. Wt Readings from Last 3 Encounters:  11/29/15 176 lb 3.2 oz (79.924 kg)  10/30/15 175 lb (79.379 kg)  10/26/15 175 lb (79.379 kg)   General: Alert, oriented, no distress.  HEENT: Normocephalic, atraumatic. Pupils round and reactive; sclera anicteric; fungating type lesion on the top of his head which has grown in size and now was covered with a white cream to debulk the lesion. Nose without nasal septal hypertrophy; bandage due to recent biopsy which proved to be low level malignancy Mouth/Parynx benign; Mallinpatti scale  2 Neck: No JVD, no carotid bruits with  transmitted murmur Chest: No tenderness to palpation Lungs: clear to ausculatation and percussion; no wheezing or rales Heart: RRR, s1 s2  normal 2/6 , 2/6 harsh systolic murmur in the aortic area and 1/ 6 diastolic murmur compatible with AS/AR. No rubs thrills or heaves. Abdomen: soft, nontender; no hepatosplenomehaly, BS+; abdominal aorta nontender and not dilated by palpation. Back: No CVA Pulses 2+ Extremities: Bilateral 2+ pitting edema of his ankles;, Homan's sign negative  Neurologic: grossly nonfocal Psychologic: Normal affect and mood  ECG (independently read by me): Sinus bradycardia at 51 bpm.  Probable left bundle branch block.  LVH with repolarization changes.  Q wave in lead 3.  December 2016 ECG (independently read by me): Sinus bradycardia 53 bpm with left bundle branch block.  No ectopy.  Q wave in lead 3.  August 2016 ECG (independently read by me): Sinus bradycardia 54 bpm with mild sinus arrhythmia.  Left bundle branch block with repolarization changes.  March 2016 ECG (independently read by me): Sinus bradycardia 54 bpm.  Left bundle branch block with repolarization changes.  QTc interval 470 ms.  November 2015 ECG (independently read by me): Sinus bradycardia 55 bpm with left bundle branch block and repolarization changes  ECG (independently read by me): Sinus bradycardia 57 beats per minute.  Left bundle branch block with repolarization changes.  PR interval is now normal at 162 ms  Prior February 2015 ECG (independently read by me): Sinus bradycardia 58 beats per minute. Left bundle branch block. Borderline first-degree block with a PR interval 208 ms the  Prior ECG of 06/22/2013:  Sinus rhythm at 63 beats per minute with resolution of prior first degree heart block. Left bundle branch block with repolarization changes.  LABS:  BMP Latest Ref Rng 10/30/2015 06/26/2015 11/16/2014  Glucose 65 - 99 mg/dL 101(H) 80 90  BUN 7 - 25 mg/dL '14 16 21  '$ Creatinine 0.70 - 1.11  mg/dL 0.91 0.85 0.78  Sodium 135 - 146 mmol/L 137 136 133(L)  Potassium 3.5 - 5.3 mmol/L 5.1 4.8 4.2  Chloride 98 - 110 mmol/L 95(L) 95(L) 97  CO2 20 - 31 mmol/L '30 29 29  '$ Calcium 8.6 - 10.3 mg/dL 10.0 10.7(H) 10.3    Hepatic Function Latest Ref Rng 06/26/2015 10/26/2014 09/28/2014  Total Protein 6.0 - 8.3 g/dL 7.3 7.0 6.3  Albumin 3.5 - 5.2 g/dL 0.5(L) 4.6 4.1  AST 0 - 37 U/L '20 21 19  '$ ALT 0 - 53 U/L '15 16 10  '$ Alk Phosphatase 39 - 117 U/L 59 55 55  Total Bilirubin 0.2 - 1.2 mg/dL 0.5 0.6 0.5  Bilirubin, Direct 0.0 - 0.3 mg/dL - - -    CBC Latest Ref Rng 11/16/2014 11/03/2014 11/02/2014  WBC 4.0 - 10.5 K/uL 5.9 7.5 8.2  Hemoglobin 13.0 - 17.0 g/dL 11.8(L) 12.3(L) 12.6(L)  Hematocrit 39.0 - 52.0 % 34.8(L) 38.0(L) 38.3(L)  Platelets 150.0 - 400.0 K/uL 175.0 127(L) 131(L)   Lab Results  Component Value Date   TSH 3.170 09/28/2014    Lipid Panel     Component Value Date/Time   CHOL 135 09/28/2014 0933   TRIG 77 09/28/2014 0933   HDL 48 09/28/2014 0933   LDLCALC 72 09/28/2014 0933     RADIOLOGY: No results found.   ASSESSMENT AND PLAN:  Mr. Peace is an 82 year old white male who is 28 years following initial PTCA of his LAD and 18 years status post CABG surgery.   A  nuclear perfusion study in March 2013 revealed fairly normal perfusion with the exception of a mild anteroseptal defect most likely related to his left bundle branch block.  He developed  recurrent chest pain and increasing shortness of breath symptomatology in 2016 year and a nuclear study was changed and demonstrated new lateral wall ischemia.  He has mild-to-moderate aortic stenosis with reduced LV function with an ejection fraction of 35-40% which has remained stable over the past several years.  He has stable but chronic claudication symptoms and has documented one vessel runoff via the peroneal artery of his right lower extremity.  Cardiac catheterization was performed and was he found to have 95% stenosis in  the sequential vein graft supplying 2 marginal vessels which accounts for his lateral ischemia seen as most recent study.  He is felt significantly improved following successful intervention with insertion of a 3.015 mm Resolute DES stent postdilated to 3.25 mm.  Shortness of breath has improved.  Right heart catheterization demonstrated only very mild pulmonary hypertension with a PA pressure 32/17.  At catheterization, he had mild aortic stenosis with a peak aortic gradient of 7, and the mean aortic valve gradient of 10.  His aortic valve area is 1.9 cm.  Subsequent, he has had episodic palpitations and had had some mild dizziness.  He did not tolerate the switch to metoprolol.  ECG today shows sinus bradycardia on his current dose of atenolol 100 mg twice a day.  He has been taking amlodipine 10 mg and hydralazine 10 mg 3 times a day in addition to torsemide 20 mg twice a day.  He has 2+ peripheral edema bilaterally.  On exam today.  I am recommending he decrease his amlodipine to 5 mg.  I will titrate his hydralazine to 20 mg every 8 hours and as long his.  He continues to have significant edema.  He will increase the morning dose of torsemide to 40 mg and take 20 mg in the afternoon.  I reviewed his most recent echo Doppler dated with him in detail, which suggests at least moderate aortic stenosis.  We have discussed potential for possible aortic valve replacement in the future.  I will see him in 6 weeks for reevaluation and further recommendations will be made at that time.  Time spent: 25 minutes   Troy Sine, MD, [facility]  12/03/2015 2:01 PM

## 2015-12-07 ENCOUNTER — Ambulatory Visit: Payer: Medicare Other | Admitting: Physician Assistant

## 2015-12-11 ENCOUNTER — Other Ambulatory Visit: Payer: Self-pay | Admitting: Physician Assistant

## 2015-12-11 NOTE — Telephone Encounter (Signed)
Rx(s) sent to pharmacy electronically.  

## 2015-12-15 ENCOUNTER — Other Ambulatory Visit: Payer: Self-pay | Admitting: *Deleted

## 2015-12-15 MED ORDER — TORSEMIDE 20 MG PO TABS: ORAL_TABLET | ORAL | Status: DC

## 2015-12-15 NOTE — Telephone Encounter (Signed)
Rx request sent to pharmacy.  

## 2015-12-18 ENCOUNTER — Other Ambulatory Visit: Payer: Self-pay | Admitting: Physician Assistant

## 2016-01-03 ENCOUNTER — Other Ambulatory Visit: Payer: Self-pay | Admitting: *Deleted

## 2016-01-03 ENCOUNTER — Telehealth: Payer: Self-pay | Admitting: Cardiovascular Disease

## 2016-01-03 MED ORDER — HYDRALAZINE HCL 10 MG PO TABS: 20.0000 mg | ORAL_TABLET | Freq: Three times a day (TID) | ORAL | Status: AC

## 2016-01-03 NOTE — Telephone Encounter (Signed)
Pt was in in May and needed to see Tresa EndoKelly in 2 months-was told they schedule was not out yet and he would receive a call-he hasn't heard anything and now calling and he schedule is full as long as the schedule is out-offered a  PA, pt states that Dr. Tresa EndoKelly wanted to see him-pls advise

## 2016-01-03 NOTE — Telephone Encounter (Signed)
Scheduling concern only. Resolved by Billie Robinson.  

## 2016-01-05 ENCOUNTER — Other Ambulatory Visit: Payer: Self-pay | Admitting: *Deleted

## 2016-01-05 MED ORDER — ATENOLOL 100 MG PO TABS: 100.0000 mg | ORAL_TABLET | Freq: Two times a day (BID) | ORAL | Status: DC

## 2016-01-11 ENCOUNTER — Telehealth: Payer: Self-pay

## 2016-01-11 MED ORDER — ATENOLOL 50 MG PO TABS: 100.0000 mg | ORAL_TABLET | Freq: Two times a day (BID) | ORAL | Status: DC

## 2016-01-11 NOTE — Telephone Encounter (Signed)
Received fax from [facility] stating Atenolol 100mg  is out of stock, advised to change to Atenolol 50mg  due to availability.  New rx will be sent to pharmacy. Advised patient that the new directions on smaller will double the amount of tabs he will have to take. Spoke with patient voiced understanding and ok'd decision.

## 2016-01-20 ENCOUNTER — Other Ambulatory Visit: Payer: Self-pay | Admitting: Cardiovascular Disease

## 2016-01-22 NOTE — Telephone Encounter (Signed)
Rx(s) sent to pharmacy electronically.  

## 2016-01-30 ENCOUNTER — Ambulatory Visit (INDEPENDENT_AMBULATORY_CARE_PROVIDER_SITE_OTHER): Payer: Medicare Other | Admitting: Nurse Practitioner

## 2016-01-30 ENCOUNTER — Encounter: Payer: Self-pay | Admitting: Nurse Practitioner

## 2016-01-30 VITALS — BP 153/65 | HR 53 | Ht 66.0 in | Wt 175.6 lb

## 2016-01-30 DIAGNOSIS — I2510 Atherosclerotic heart disease of native coronary artery without angina pectoris: Secondary | ICD-10-CM

## 2016-01-30 DIAGNOSIS — I110 Hypertensive heart disease with heart failure: Secondary | ICD-10-CM | POA: Diagnosis not present

## 2016-01-30 DIAGNOSIS — I5022 Chronic systolic (congestive) heart failure: Secondary | ICD-10-CM

## 2016-01-30 DIAGNOSIS — I350 Nonrheumatic aortic (valve) stenosis: Secondary | ICD-10-CM

## 2016-01-30 DIAGNOSIS — R5383 Other fatigue: Secondary | ICD-10-CM

## 2016-01-30 DIAGNOSIS — E785 Hyperlipidemia, unspecified: Secondary | ICD-10-CM

## 2016-01-30 LAB — CBC
HEMATOCRIT: 36.9 % — AB (ref 38.5–50.0)
Hemoglobin: 12.1 g/dL — ABNORMAL LOW (ref 13.2–17.1)
MCH: 30.7 pg (ref 27.0–33.0)
MCHC: 32.8 g/dL (ref 32.0–36.0)
MCV: 93.7 fL (ref 80.0–100.0)
MPV: 9.1 fL (ref 7.5–12.5)
PLATELETS: 194 10*3/uL (ref 140–400)
RBC: 3.94 MIL/uL — ABNORMAL LOW (ref 4.20–5.80)
RDW: 13.8 % (ref 11.0–15.0)
WBC: 7 10*3/uL (ref 3.8–10.8)

## 2016-01-30 NOTE — Patient Instructions (Signed)
Ward Givens, NP, recommends that you continue on your current medications as directed. Please refer to the Current Medication list given to you today.  Your physician recommends that you return for lab work TODAY.  Thayer Ohm recommends that you schedule a follow-up appointment in 1 month with Dr Tresa Endo or a NP/PA.  If you need a refill on your cardiac medications before your next appointment, please call your pharmacy.

## 2016-01-30 NOTE — Progress Notes (Signed)
Office Visit    Patient Name: Melvin [name] Date of Encounter: 01/30/2016  Primary Care Provider:  Colette Ribas, MD Primary Cardiologist:  Bishop Limbo, MD   Chief Complaint    82 y/o ? with a h/o CAD, moderate AS, HTN, HL, DM, and PVC's with palpitations, who presents for f/u.  Past Medical History    Past Medical History:  Diagnosis Date  . Age-related macular degeneration, dry, both eyes   . Arthritis    "just about all over my body"  . Chronic combined systolic and diastolic CHF (congestive heart failure) (HCC)    a. 11/2015 Echo: EF 30-35%, diff HK, Gr1 DD.  Marland Kitchen Coronary artery disease    a. 1991 PTCA of LAD; b. 1998 CABG x 5 (LIMA->LAD, VG->RPDA->RPLA, VG->OM1, VG->D2; c. 09/2014 MV: lat ischemia, EF 37%; d. 10/2014 PCI: LAD 100, LCX 99ost/100p, RCA 100p, LIMA->LAD ok, VG->D2 ok, VG->OM1 95(3.0x15 Resolute Integrity DES), VG->PDA ok w/ 30% w/in VG->PL.  Marland Kitchen GERD (gastroesophageal reflux disease)   . High cholesterol   . History of hiatal hernia   . Hypertension   . Ischemic cardiomyopathy    a. 11/2015 Echo: EF 30-35%, diff HK, Gr1 DD.  Marland Kitchen LBBB (left bundle branch block)   . Moderate aortic stenosis    a. 11/2015 Echo: EF 30-35%, diff HK, Gr1 DD, mod AS, mild AI, mildly dil Ao root and Asc Ao, mild MR, mod dil LA, PASP .  . Osteoporosis   . PAD (peripheral artery disease) (HCC)    a. s/p prior rotational atherectomy of L pop and tib trunk and PTA of focal popliteal lesions; b. 03/2015 ABI: R - 0.88, L 0.96, LCFA 50-79%, RSFA 50-79% distal stenosis, 3 vessel runoff.  . Palpitations    a. 06/2012 Event monitor: freq ventricular ectopy w/o VT.  Marland Kitchen PVC (premature ventricular contraction) 04/26/1997   on beta blocker/pt had cardio net monitor 06/23/2012  . RLS (restless legs syndrome) 10/01/2012  . Type II diabetes mellitus (HCC)   . Unspecified hereditary and idiopathic peripheral neuropathy 10/01/2012   Past Surgical History:  Procedure Laterality Date  .  APPENDECTOMY    . CARDIAC CATHETERIZATION  04/26/1997   CAD - 50-60% prox to mid LAD with 70% stenosis in mid-distal, 70-80% 1st diag stenosis, 50-60% ostial stenosis in large 2nd diagonal; diffuse RCA stenosis with 40-50% osital narrowing with 70% prox stenosis (Dr. Bishop Limbo) >> CABG  . CARDIAC CATHETERIZATION  11/01/2014  . CATARACT EXTRACTION W/ INTRAOCULAR LENS  IMPLANT, BILATERAL Bilateral   . CORONARY ARTERY BYPASS GRAFT  04/1997   CABGx5 - LIMA to LAD, SVG to PDA, SVG to PLA, VG to OM1, VG to 2nd diagonal (Dr. Dorris Fetch)  . DOPPLER ECHOCARDIOGRAPHY  05/18/2012   EF 45-50%, LV systolic function mildly reduced; borderline LA enlargment; mild mitral annular calcif, mild-mod MR; mild TR with normal RVSP; mild calcification of AV leaflets and mild-mod valvular AS with mild regurg      . DOPPLER ECHOCARDIOGRAPHY  08/19/2011   EF 50-55%, mod conc LVH; mild mitral annular calcif with mild MR; mild-mod TR; AV mildly sclerotic with mild valvular AS and mild regurg, mild aortic root dilatation  . HERNIA REPAIR    . HIATAL HERNIA REPAIR    . INGUINAL HERNIA REPAIR Bilateral   . LEFT AND RIGHT HEART CATHETERIZATION WITH CORONARY ANGIOGRAM N/A 11/01/2014   Procedure: LEFT AND RIGHT HEART CATHETERIZATION WITH CORONARY ANGIOGRAM;  Surgeon: Lennette Bihari, MD; EF35-40%, mild AS w/ 2+  AI, LAD, CFX, RCA 100%, LIMA-LAD OK, SVG-D1 OK, SVG-PDA 30%, SVG-OM 95%  . LOWER EXTREMITY ANGIOGRAM  02/05/2011   Diamondback orbital rotational atherectomy, percutaneous transluminal angioplasty of high-grade calcified popliteal & tibioperoneal stenosis (Dr. Erlene Quan)  . LOWER EXTREMITY ARTERIAL DOPPLER  02/2012   RLE - mild to mod arterial insuff; RSFA 50-69% diameter reduction; R pop 50-69% diameter reduction; posterior tibial (R) demonstrates occlusive disease; LSFA/prox pop 0-49% diameter reduction; L distal pop 50-69% diameter reduction; posterior tibial (L) demonstrates occlusive disease  . NM MYOCAR PERF EJECTION  FRACTION  09/2011   lexiscan - no reversible ischmai, fixed anteroseptal defect r/t LBBB; EF 51%; septal hypokinesis; low risk but abnormal  . PERCUTANEOUS CORONARY STENT INTERVENTION (PCI-S) N/A 11/02/2014   Procedure: PERCUTANEOUS CORONARY STENT INTERVENTION (PCI-S);  Surgeon: Lennette Bihari, MD; 3.015 mm Resolute integrity DES to the SVG-OM   . TONSILLECTOMY      Allergies  Allergies  Allergen Reactions  . Other     Patient is allergic to something but doesn't know the name.  . Pramipexole Swelling    History of Present Illness    82 year old male with the above accomplished past medical history. His cardiac history dates back to the 1980s, when he developed ventricular ectopy and was placed on beta blocker therapy. In 1991, he underwent PTCA of his LAD with subsequent CABG 5 in October 1998. In March 2016, he was experiencing significant fatigue and dyspnea on exertion. He was not necessarily having chest pain. He had an abnormal stress test. Catheterization revealed severe disease in the vein graft to the obtuse marginal and otherwise severe occlusive native coronary artery disease and patent LIMA to the LAD, patent vein graft to diagonal, and patent sequential vein graft to the PDA and PL. The vein graft to the obtuse marginal system was successfully stented with a drug-eluting stent (3.0 x 15 mm resolute integrity). He did have improvement in overall symptoms, but has continued to have some degree of generalized weakness, dyspnea on exertion, as well as orthopnea ever since that episode in March 2016. He was seen in clinic in April 2017, and his atenolol was switched to metoprolol however he felt worse on metoprolol and was subsequently switched back to atenolol. Echocardiogram was performed in May 2017, revealing an EF of 30-35% with moderate LVH, diffuse hypokinesis, grade 1 diastolic dysfunction, and moderate aortic stenosis with mean gradient of 22 mmHg and an aortic valve area of 1.1  cm. He has a known prior history of mild to moderate aortic stenosis.  He was last seen in clinic by Dr. Tresa Endo on May 24, at which time he continued to report ongoing weakness, stable dyspnea on exertion, and lower extremity edema. In that setting, his amlodipine dose was reduced to 5 mg daily. Hydralazine was increased to 20 mg 3 times a day. Torsemide dose was increased to 40 mg in the morning and 20 mg in the afternoon. Since adjusting his medications, he has had improvement in lower extremity swelling. He says that he has chronic lower extremity edema and would never expect to have no edema at all. He still has mild edema above his sock line but overall, this is improved. He otherwise has not been feeling any differently, continuing to experience weakness/low energy, orthopnea requiring him to sleep in his recliner (present for at least 8 months), and dyspnea on exertion. He has not been having any chest pain. He is keen to say that he still feels better than how  he felt prior to his stent placement in 2016.  Home Medications    Prior to Admission medications   Medication Sig Start Date End Date Taking? Authorizing Provider  amLODipine (NORVASC) 10 MG tablet Take 0.5 tablets (5 mg total) by mouth daily. KEEP OV. 11/29/15  Yes Lennette Bihari, MD  aspirin 81 MG tablet Take 81 mg by mouth daily.   Yes Historical Provider, MD  atenolol (TENORMIN) 50 MG tablet Take 2 tablets (100 mg total) by mouth 2 (two) times daily. 01/11/16  Yes Lennette Bihari, MD  atorvastatin (LIPITOR) 40 MG tablet Take 1 tablet (40 mg total) by mouth daily at 6 PM. KEEP OV. 11/20/15  Yes Lennette Bihari, MD  clopidogrel (PLAVIX) 75 MG tablet Take 1 tablet (75 mg total) by mouth daily. KEEP OV. 11/20/15  Yes Lennette Bihari, MD  gabapentin (NEURONTIN) 300 MG capsule Take 1 capsule (300 mg total) by mouth at bedtime. 10/26/15  Yes Huston Foley, MD  hydrALAZINE (APRESOLINE) 10 MG tablet Take 2 tablets (20 mg total) by mouth every 8 (eight)  hours. 01/03/16  Yes Lennette Bihari, MD  metformin (FORTAMET) 1000 MG (OSM) 24 hr tablet Take 1,000 mg by mouth 2 (two) times daily with a meal.   Yes Historical Provider, MD  Multiple Vitamins-Minerals (PRESERVISION AREDS 2 PO) Take 1 capsule by mouth 2 (two) times daily.    Yes Historical Provider, MD  multivitamin-iron-minerals-folic acid (CENTRUM) chewable tablet Chew 1 tablet by mouth daily.   Yes Historical Provider, MD  niacin (NIASPAN) 750 MG CR tablet Take 2 tablets (1,500 mg total) by mouth at bedtime. 01/22/16  Yes Lennette Bihari, MD  nitroGLYCERIN (NITROSTAT) 0.4 MG SL tablet Place 1 tablet (0.4 mg total) under the tongue every 5 (five) minutes as needed for chest pain. 11/29/15  Yes Lennette Bihari, MD  oxymetazoline (AFRIN) 0.05 % nasal spray Place 2 sprays into both nostrils 3 (three) times daily as needed for congestion (Allergies).   Yes Historical Provider, MD  polycarbophil (FIBERCON) 625 MG tablet Take 625 mg by mouth daily.   Yes Historical Provider, MD  torsemide (DEMADEX) 20 MG tablet Take 2 tablets in the the morning and 1 tablet. 12/15/15  Yes Lennette Bihari, MD  zoledronic acid (RECLAST) 5 MG/100ML SOLN Inject 100 mLs (5 mg total) into the vein once. 07/01/11  Yes Reather Littler, MD    Review of Systems    As above, he continues to have generalized low energy state and weakness, orthopnea-sleeping in a recliner, and dyspnea exertion. His lower extremity edema has improved. He denies PND, dizziness, syncope, or early satiety.  All other systems reviewed and are otherwise negative except as noted above.  Physical Exam    VS:  BP (!) 153/65   Pulse (!) 53   Ht 5\' 6"  (1.676 m)   Wt 175 lb 9.6 oz (79.7 kg)   BMI 28.34 kg/m  , BMI Body mass index is 28.34 kg/m. GEN: Well nourished, well developed, in no acute distress.  HEENT: normal.  Neck: Supple, no JVD, carotid bruits, or masses. Cardiac: RRR, 3/6 systolic ejection murmur loudest at the right upper sternal border but heard  throughout. Audible S2. No rubs, or gallops. No clubbing, cyanosis, trace to 1+ bilateral lower extremity edema just above the sock line.  Radials/DP/PT 2+ and equal bilaterally.  Respiratory:  Respirations regular and unlabored, clear to auscultation bilaterally. GI: Soft, nontender, nondistended, BS + x 4. MS: no deformity or  atrophy. Skin: warm and dry, no rash. Neuro:  Strength and sensation are intact. Psych: Normal affect.  Accessory Clinical Findings    CBC, basic metabolic panel, TSH, and BNP are pending  Assessment & Plan    1.  Chronic combined systolic and diastolic congestive heart failure/ischemic cardiomyopathy: Patient has somewhat chronic dyspnea on exertion, dating back over a year. This was worse prior to stent placement in March 2016 and has been relatively stable since. Over the past 6-8 months, he's been experiencing orthopnea and as result has been sleeping in his recliner. Echocardiogram in May 2017 showed an EF of 30-35%, which was down slightly from prior reading in November 2015. Upon his last clinic visit in May, he had evidence of volume overload and his torsemide was increased to 40 mg in the morning and 20 mg in the evening. On this test, he has had improvement in his lower extremity edema. He continues to have dyspnea on exertion and also orthopnea. His weight has been stable at home and also relatively stable over the past 4 years. As he has been on a higher dose of diuretic therapy, I will follow-up a basic metabolic panel today. As he has ongoing fatigue, I will also evaluate the CBC, TSH, and brain natriuretic peptide to determine if he requires further diuresis and possibly the addition of spironolactone if appropriate. He otherwise remains on atenolol and hydralazine therapy. Next  2. Coronary artery disease: Status post remote CABG and more recently status post drug-eluting stent placement to the sequential vein graft to the obtuse marginals. That was performed  in March 2016. His current symptoms of fatigue and dyspnea/orthopnea have been relatively stable since that stent was placed. He was worse off prior to stenting. In that setting, it is not clear that he requires further ischemic evaluation at this time. He remains on aspirin, beta blocker, Plavix, statin therapy.  3. Moderate aortic stenosis: Recent echo showed moderate aortic stenosis with a mean gradient of 22 mmHg and an aortic valve area of 1.1 cm. Question role in current symptoms. With reduced LV function, question the possibility of low flow, low gradient severe AS. Could consider dobutamine echocardiogram to evaluate gradient with referral to TAVR clinic in the future. Await laboratory studies as above to look for other causes for his symptoms first.  4. Fatigue: In the setting of 1, 2, and 3. Follow-up CBC and TSH today.  5. Hypertensive heart disease: Blood pressure is elevated at 153/65 today. He says he checks it at home and is typically about 1:30 to 140 systolic. In that setting, and with a prior history of presyncope, I'm reluctant to adjust his blood pressure medications further.  6. HL:  LDL 72 in March.  Cont statin therapy.  7.  Disposition: Follow-up lab work today. Follow-up in clinic in one month or sooner if necessary.  Nicolasa Ducking, NP 01/30/2016, 5:14 PM

## 2016-01-31 LAB — TSH: TSH: 3.28 m[IU]/L (ref 0.40–4.50)

## 2016-01-31 LAB — BASIC METABOLIC PANEL
BUN: 20 mg/dL (ref 7–25)
CO2: 31 mmol/L (ref 20–31)
Calcium: 9.9 mg/dL (ref 8.6–10.3)
Chloride: 96 mmol/L — ABNORMAL LOW (ref 98–110)
Creat: 0.82 mg/dL (ref 0.70–1.11)
Glucose, Bld: 97 mg/dL (ref 65–99)
POTASSIUM: 4.8 mmol/L (ref 3.5–5.3)
SODIUM: 137 mmol/L (ref 135–146)

## 2016-01-31 LAB — BRAIN NATRIURETIC PEPTIDE: Brain Natriuretic Peptide: 299.6 pg/mL — ABNORMAL HIGH

## 2016-02-02 ENCOUNTER — Telehealth: Payer: Self-pay | Admitting: Cardiovascular Disease

## 2016-02-02 NOTE — Telephone Encounter (Signed)
New Message  Pt states he received a call from cardiology office, but could not make out with the VM was stating. Please call back to discuss

## 2016-02-02 NOTE — Telephone Encounter (Signed)
Returned called to patient. Notified him of lab results. Routed to PCP per request

## 2016-02-04 ENCOUNTER — Other Ambulatory Visit: Payer: Self-pay | Admitting: Cardiovascular Disease

## 2016-02-09 ENCOUNTER — Telehealth: Payer: Self-pay | Admitting: Nurse Practitioner

## 2016-02-09 ENCOUNTER — Other Ambulatory Visit: Payer: Self-pay | Admitting: Nurse Practitioner

## 2016-02-09 DIAGNOSIS — I350 Nonrheumatic aortic (valve) stenosis: Secondary | ICD-10-CM

## 2016-02-09 NOTE — Telephone Encounter (Signed)
     I called Mr. Lupercio this afternoon to discuss his recent lab findings, symptoms, and aortic stenosis.  After his last visit, at which time he complained of doe, fatigue, and orthopnea, his labs were not particularly revealing as to the etiology of his symptoms.  His BNP was only mildly elevated while his TSH was normal and he was not particularly anemic.  Since that visit, he has continued to have DOE and orthopnea.  His wt is stable and he denies edema.  I recently discussed his case with Dr. Tresa Endo and we mutually agreed that a dobutamine stress echo to assess for worsening of his AS gradient would be prudent in the setting of his LV dysfxn.  I explained this to Mr. Barz this evening and he is willing to proceed with dobutamine stress echo.  I advised that I will reach out to Dr. Landry Dyke nurse to help [place] in getting this scheduled @ [facility], hopefully within the next week or so and that he will be hearing from [place].    Caller verbalized understanding and was grateful for the call back.  Nicolasa Ducking, NP 02/09/2016, 5:24 PM

## 2016-02-12 NOTE — Telephone Encounter (Signed)
I discussed with Ward Givenshris Berge and agree with plan

## 2016-02-22 ENCOUNTER — Ambulatory Visit (INDEPENDENT_AMBULATORY_CARE_PROVIDER_SITE_OTHER): Payer: Medicare Other | Admitting: Ophthalmology

## 2016-02-22 DIAGNOSIS — H43813 Vitreous degeneration, bilateral: Secondary | ICD-10-CM

## 2016-02-22 DIAGNOSIS — H35373 Puckering of macula, bilateral: Secondary | ICD-10-CM

## 2016-02-22 DIAGNOSIS — E11319 Type 2 diabetes mellitus with unspecified diabetic retinopathy without macular edema: Secondary | ICD-10-CM

## 2016-02-22 DIAGNOSIS — E113293 Type 2 diabetes mellitus with mild nonproliferative diabetic retinopathy without macular edema, bilateral: Secondary | ICD-10-CM

## 2016-02-22 DIAGNOSIS — H26491 Other secondary cataract, right eye: Secondary | ICD-10-CM

## 2016-02-22 DIAGNOSIS — H35033 Hypertensive retinopathy, bilateral: Secondary | ICD-10-CM

## 2016-02-22 DIAGNOSIS — H353131 Nonexudative age-related macular degeneration, bilateral, early dry stage: Secondary | ICD-10-CM | POA: Diagnosis not present

## 2016-02-22 DIAGNOSIS — I10 Essential (primary) hypertension: Secondary | ICD-10-CM

## 2016-02-27 ENCOUNTER — Other Ambulatory Visit: Payer: Self-pay | Admitting: Pharmacist Clinician (PhC)/ Clinical Pharmacy Specialist

## 2016-02-27 MED ORDER — METOPROLOL TARTRATE 100 MG PO TABS: 100.0000 mg | ORAL_TABLET | Freq: Two times a day (BID) | ORAL | 1 refills | Status: DC

## 2016-02-27 NOTE — Telephone Encounter (Signed)
Atenolol on back order [place].  Current dose is 100 mg bid.  Will switch to metoprolol tartrate 100 mg bid.    Spoke with patient, he is aware of change.  He is to call with questions or concerns.

## 2016-03-05 ENCOUNTER — Ambulatory Visit (INDEPENDENT_AMBULATORY_CARE_PROVIDER_SITE_OTHER): Payer: Medicare Other | Admitting: Nurse Practitioner

## 2016-03-05 ENCOUNTER — Encounter: Payer: Self-pay | Admitting: Nurse Practitioner

## 2016-03-05 VITALS — BP 159/67 | HR 53 | Ht 66.0 in | Wt 173.2 lb

## 2016-03-05 DIAGNOSIS — I110 Hypertensive heart disease with heart failure: Secondary | ICD-10-CM

## 2016-03-05 DIAGNOSIS — I25810 Atherosclerosis of coronary artery bypass graft(s) without angina pectoris: Secondary | ICD-10-CM

## 2016-03-05 DIAGNOSIS — I5042 Chronic combined systolic (congestive) and diastolic (congestive) heart failure: Secondary | ICD-10-CM | POA: Diagnosis not present

## 2016-03-05 DIAGNOSIS — I350 Nonrheumatic aortic (valve) stenosis: Secondary | ICD-10-CM | POA: Diagnosis not present

## 2016-03-05 DIAGNOSIS — E785 Hyperlipidemia, unspecified: Secondary | ICD-10-CM

## 2016-03-05 DIAGNOSIS — I2510 Atherosclerotic heart disease of native coronary artery without angina pectoris: Secondary | ICD-10-CM

## 2016-03-05 NOTE — Patient Instructions (Signed)
Medication Instructions: Your physician recommends that you continue on your current medications as directed. Please refer to the Current Medication list given to you today.  Labwork: None ordered  Testing/Procedures: None ordered  Follow-Up: Your physician recommends that you schedule a follow-up appointment in: 1 MONTH AFTER ECHO IS COMPLETE.    Any Other Special Instructions Will Be Listed Below (If Applicable).  E[facility] has been ordered but not scheduled. [facility]   If you need a refill on your cardiac medications before your next appointment, please call your pharmacy.

## 2016-03-05 NOTE — Progress Notes (Signed)
Office Visit    Patient Name: Melvin [name] Date of Encounter: 03/05/2016  Primary Care Provider:  Colette Ribas, MD Primary Cardiologist:  Bishop Limbo, MD   Chief Complaint    82 y/o ? with a h/o CAD, moderate AS, HTN, HL, DM, and PVC's with palpitations, who presents for f/u.  Past Medical History    Past Medical History:  Diagnosis Date  . Age-related macular degeneration, dry, both eyes   . Arthritis    "just about all over my body"  . Chronic combined systolic and diastolic CHF (congestive heart failure) (HCC)    a. 11/2015 Echo: EF 30-35%, diff HK, Gr1 DD.  Marland Kitchen Coronary artery disease    a. 1991 PTCA of LAD; b. 1998 CABG x 5 (LIMA->LAD, VG->RPDA->RPLA, VG->OM1, VG->D2; c. 09/2014 MV: lat ischemia, EF 37%; d. 10/2014 PCI: LAD 100, LCX 99ost/100p, RCA 100p, LIMA->LAD ok, VG->D2 ok, VG->OM1 95(3.0x15 Resolute Integrity DES), VG->PDA ok w/ 30% w/in VG->PL.  Marland Kitchen GERD (gastroesophageal reflux disease)   . High cholesterol   . History of hiatal hernia   . Hypertension   . Ischemic cardiomyopathy    a. 11/2015 Echo: EF 30-35%, diff HK, Gr1 DD.  Marland Kitchen LBBB (left bundle branch block)   . Moderate aortic stenosis    a. 11/2015 Echo: EF 30-35%, diff HK, Gr1 DD, mod AS, mild AI, mildly dil Ao root and Asc Ao, mild MR, mod dil LA, PASP .  . Osteoporosis   . PAD (peripheral artery disease) (HCC)    a. s/p prior rotational atherectomy of L pop and tib trunk and PTA of focal popliteal lesions; b. 03/2015 ABI: R - 0.88, L 0.96, LCFA 50-79%, RSFA 50-79% distal stenosis, 3 vessel runoff.  . Palpitations    a. 06/2012 Event monitor: freq ventricular ectopy w/o VT.  Marland Kitchen PVC (premature ventricular contraction) 04/26/1997   on beta blocker/pt had cardio net monitor 06/23/2012  . RLS (restless legs syndrome) 10/01/2012  . Type II diabetes mellitus (HCC)   . Unspecified hereditary and idiopathic peripheral neuropathy 10/01/2012   Past Surgical History:  Procedure Laterality Date  .  APPENDECTOMY    . CARDIAC CATHETERIZATION  04/26/1997   CAD - 50-60% prox to mid LAD with 70% stenosis in mid-distal, 70-80% 1st diag stenosis, 50-60% ostial stenosis in large 2nd diagonal; diffuse RCA stenosis with 40-50% osital narrowing with 70% prox stenosis (Dr. Bishop Limbo) >> CABG  . CARDIAC CATHETERIZATION  11/01/2014  . CATARACT EXTRACTION W/ INTRAOCULAR LENS  IMPLANT, BILATERAL Bilateral   . CORONARY ARTERY BYPASS GRAFT  04/1997   CABGx5 - LIMA to LAD, SVG to PDA, SVG to PLA, VG to OM1, VG to 2nd diagonal (Dr. Dorris Fetch)  . DOPPLER ECHOCARDIOGRAPHY  05/18/2012   EF 45-50%, LV systolic function mildly reduced; borderline LA enlargment; mild mitral annular calcif, mild-mod MR; mild TR with normal RVSP; mild calcification of AV leaflets and mild-mod valvular AS with mild regurg      . DOPPLER ECHOCARDIOGRAPHY  08/19/2011   EF 50-55%, mod conc LVH; mild mitral annular calcif with mild MR; mild-mod TR; AV mildly sclerotic with mild valvular AS and mild regurg, mild aortic root dilatation  . HERNIA REPAIR    . HIATAL HERNIA REPAIR    . INGUINAL HERNIA REPAIR Bilateral   . LEFT AND RIGHT HEART CATHETERIZATION WITH CORONARY ANGIOGRAM N/A 11/01/2014   Procedure: LEFT AND RIGHT HEART CATHETERIZATION WITH CORONARY ANGIOGRAM;  Surgeon: Lennette Bihari, MD; EF35-40%, mild AS w/ 2+  AI, LAD, CFX, RCA 100%, LIMA-LAD OK, SVG-D1 OK, SVG-PDA 30%, SVG-OM 95%  . LOWER EXTREMITY ANGIOGRAM  02/05/2011   Diamondback orbital rotational atherectomy, percutaneous transluminal angioplasty of high-grade calcified popliteal & tibioperoneal stenosis (Dr. Erlene QuanJ. Berry)  . LOWER EXTREMITY ARTERIAL DOPPLER  02/2012   RLE - mild to mod arterial insuff; RSFA 50-69% diameter reduction; R pop 50-69% diameter reduction; posterior tibial (R) demonstrates occlusive disease; LSFA/prox pop 0-49% diameter reduction; L distal pop 50-69% diameter reduction; posterior tibial (L) demonstrates occlusive disease  . NM MYOCAR PERF EJECTION  FRACTION  09/2011   lexiscan - no reversible ischmai, fixed anteroseptal defect r/t LBBB; EF 51%; septal hypokinesis; low risk but abnormal  . PERCUTANEOUS CORONARY STENT INTERVENTION (PCI-S) N/A 11/02/2014   Procedure: PERCUTANEOUS CORONARY STENT INTERVENTION (PCI-S);  Surgeon: Lennette Biharihomas A Kelly, MD; 3.015 mm Resolute integrity DES to the SVG-OM   . TONSILLECTOMY      Allergies  Allergies  Allergen Reactions  . Other     Patient is allergic to something but doesn't know the name.  . Pramipexole Swelling    History of Present Illness    82 year old male with the above complex past medical history. His cardiac history dates back to the 1980s, when he developed ventricular ectopy and was placed on beta blocker therapy. In 1991, he underwent PTCA of his LAD with subsequent CABG 5 in October 1998. In March 2016, he was experiencing significant fatigue and dyspnea on exertion. He was not necessarily having chest pain. He had an abnormal stress test. Catheterization revealed severe disease in the vein graft to the obtuse marginal and otherwise severe occlusive native coronary artery disease and patent LIMA to the LAD, patent vein graft to diagonal, and patent sequential vein graft to the PDA and PL. The vein graft to the obtuse marginal system was successfully stented with a drug-eluting stent (3.0 x 15 mm resolute integrity). He did have improvement in overall symptoms, but has continued to have some degree of generalized weakness, dyspnea on exertion, as well as orthopnea ever since that episode in March 2016. He was seen in clinic in April 2017, and his atenolol was switched to metoprolol however he felt worse on metoprolol and was subsequently switched back to atenolol. Echocardiogram was performed in May 2017, revealing an EF of 30-35% with moderate LVH, diffuse hypokinesis, grade 1 diastolic dysfunction, and moderate aortic stenosis with mean gradient of 22 mmHg and an aortic valve area of 1.1 cm. He  has a known prior history of mild to moderate aortic stenosis.  When he was seen by Dr. Tresa EndoKelly in May, his medications were adjusted and torsemide increased 2/2 lower ext edema.  I saw him back 7/25 @ which time he had improved edema but ongoing fatigue, orthopnea, and DOE.  I checked labs that day, revealing nl bmet, mild, stable anemia, nl TSH, and only mildly elevated BNP @ 299.6.  After speaking with Dr. Tresa EndoKelly, I spoke to Mr. Medico on the phone in an effort to get him scheduled for a dobutamine echo to assess for low output/nl gradient AS.  This has yet to be performed.  Since his last visit, he has continued to have dyspnea on exertion as well as orthopnea. His weight is down a few pounds and he notes less edema. He has not been having any chest pain. Overall, he says his symptoms have been stable.  Home Medications    Prior to Admission medications   Medication Sig Start Date End Date Taking?  Authorizing Provider  amLODipine (NORVASC) 10 MG tablet Take 0.5 tablets (5 mg total) by mouth daily. KEEP OV. 11/29/15   Lennette Bihari, MD  amLODipine (NORVASC) 10 MG tablet TAKE 1 TABLET DAILY (KEEP OFFICE VISIT) 02/05/16   Lennette Bihari, MD  aspirin 81 MG tablet Take 81 mg by mouth daily.    Historical Provider, MD  atorvastatin (LIPITOR) 40 MG tablet TAKE 1 TABLET DAILY AT 6 P.M. (KEEP OFFICE VISIT) 02/05/16   Lennette Bihari, MD  clopidogrel (PLAVIX) 75 MG tablet TAKE 1 TABLET DAILY (KEEP OFFICE VISIT) 02/05/16   Lennette Bihari, MD  gabapentin (NEURONTIN) 300 MG capsule Take 1 capsule (300 mg total) by mouth at bedtime. 10/26/15   Huston Foley, MD  hydrALAZINE (APRESOLINE) 10 MG tablet Take 2 tablets (20 mg total) by mouth every 8 (eight) hours. 01/03/16   Lennette Bihari, MD  metformin (FORTAMET) 1000 MG (OSM) 24 hr tablet Take 1,000 mg by mouth 2 (two) times daily with a meal.    Historical Provider, MD  metoprolol (LOPRESSOR) 100 MG tablet Take 1 tablet (100 mg total) by mouth 2 (two) times daily.  02/27/16 05/27/16  Lennette Bihari, MD  Multiple Vitamins-Minerals (PRESERVISION AREDS 2 PO) Take 1 capsule by mouth 2 (two) times daily.     Historical Provider, MD  multivitamin-iron-minerals-folic acid (CENTRUM) chewable tablet Chew 1 tablet by mouth daily.    Historical Provider, MD  niacin (NIASPAN) 750 MG CR tablet Take 2 tablets (1,500 mg total) by mouth at bedtime. 01/22/16   Lennette Bihari, MD  nitroGLYCERIN (NITROSTAT) 0.4 MG SL tablet Place 1 tablet (0.4 mg total) under the tongue every 5 (five) minutes as needed for chest pain. 11/29/15   Lennette Bihari, MD  oxymetazoline (AFRIN) 0.05 % nasal spray Place 2 sprays into both nostrils 3 (three) times daily as needed for congestion (Allergies).    Historical Provider, MD  polycarbophil (FIBERCON) 625 MG tablet Take 625 mg by mouth daily.    Historical Provider, MD  torsemide (DEMADEX) 20 MG tablet Take 2 tablets in the the morning and 1 tablet. 12/15/15   Lennette Bihari, MD  zoledronic acid (RECLAST) 5 MG/100ML SOLN Inject 100 mLs (5 mg total) into the vein once. 07/01/11   Reather Littler, MD    Review of Systems    He has chronic, stable dyspnea on exertion and orthopnea. He has occasional lower extremity edema but says overall this is improved. Overall, symptoms have been stable. He denies chest pain, dizziness, syncope, or early satiety. All other systems reviewed and are otherwise negative except as noted above.  Physical Exam    VS:  BP (!) 159/67   Pulse (!) 53   Ht 5\' 6"  (1.676 m)   Wt 173 lb 3.2 oz (78.6 kg)   BMI 27.96 kg/m  , BMI Body mass index is 27.96 kg/m. GEN: Well nourished, well developed, in no acute distress.  HEENT: normal.  Neck: Supple, no JVD, radiated AS murmur noted, no masses. Cardiac: RRR, 3/6 harsh systolic ejection murmur heard throughout but loudest at the upper sternal border with radiation to the neck. no murmurs, rubs, or gallops. No clubbing, cyanosis, trace bilateral ankle edema.  Radials/DP/PT 1 + and  equal bilaterally.  Respiratory:  Respirations regular and unlabored, clear to auscultation bilaterally. GI: Soft, nontender, nondistended, BS + x 4. MS: no deformity or atrophy. Skin: warm and dry, no rash. Neuro:  Strength and sensation are intact. Psych: Normal  affect.  Accessory Clinical Findings    Previous labs reviewed with patient and family. Dobutamine stress echo pending.  Assessment & Plan    1.  Chronic combined systolic and diastolic congestive heart failure/ischemic cardiomyopathy: Patient has chronic dyspnea on exertion dating back over a year. This has been stable. He also has been expressing chronic orthopnea over the past 8+ months. Echocardiogram in May. As 17 showed an EF of 30-35%, which was down slightly from prior reading in November 2015. I last saw him in July, at which time symptoms are stable though persistent. Lab work was relatively unrevealing. I had ordered a dobutamine stress echo to assess for low flow, low gradient severe aortic stenosis, however this is yet to be performed. We will get this scheduled today. In the meantime, he remains on beta blocker, hydralazine, and torsemide.  2. Moderate aortic stenosis: Echo earlier this year showed moderate aortic stenosis with a mean gradient of 22 mmHg and an aortic valve area of 1.17 m. With LV dysfunction, I suspect low flow, low gradient severe AS may be at play.  I have previously discussed this with Dr. Tresa Endo and as above, have ordered a dobutamine stress echo. We will get this scheduled. Pending that result, he may require referral to TAVR clinic.  3. Coronary artery disease: Status post remote CABG and more recently drug-eluting stent placement to the sequential vein graft to the obtuse marginals. He has not been having any chest pain and symptoms of otherwise been stable, including his dyspnea. He remains on aspirin, beta blocker, Plavix, and statin therapy.  4. Hypertensive heart disease: Blood pressure is  elevated today however he did not take one of his blood pressure medicines. He did take it while he is here in the office. He will follow-up at home.  5. Hyperlipidemia: LDL 72 in March 2017. Continue statin therapy.  6. Disposition: Follow-up to between stress echo to reevaluate aortic stenosis. Pending that result, he may need referral to TAVR clinic.   Nicolasa Ducking, NP 03/05/2016, 2:12 PM

## 2016-03-20 ENCOUNTER — Other Ambulatory Visit (HOSPITAL_COMMUNITY): Payer: Medicare Other

## 2016-03-20 ENCOUNTER — Ambulatory Visit (HOSPITAL_BASED_OUTPATIENT_CLINIC_OR_DEPARTMENT_OTHER): Payer: Medicare Other

## 2016-03-20 ENCOUNTER — Ambulatory Visit (HOSPITAL_COMMUNITY): Payer: Medicare Other | Attending: Cardiovascular Disease

## 2016-03-20 DIAGNOSIS — I350 Nonrheumatic aortic (valve) stenosis: Secondary | ICD-10-CM

## 2016-03-27 ENCOUNTER — Encounter: Payer: Self-pay | Admitting: Cardiovascular Disease

## 2016-03-27 ENCOUNTER — Ambulatory Visit (INDEPENDENT_AMBULATORY_CARE_PROVIDER_SITE_OTHER): Payer: Medicare Other | Admitting: Cardiovascular Disease

## 2016-03-27 ENCOUNTER — Encounter: Payer: Self-pay | Admitting: *Deleted

## 2016-03-27 VITALS — BP 120/90 | HR 64 | Ht 66.0 in | Wt 173.0 lb

## 2016-03-27 DIAGNOSIS — I25810 Atherosclerosis of coronary artery bypass graft(s) without angina pectoris: Secondary | ICD-10-CM

## 2016-03-27 DIAGNOSIS — I350 Nonrheumatic aortic (valve) stenosis: Secondary | ICD-10-CM | POA: Diagnosis not present

## 2016-03-27 DIAGNOSIS — I2510 Atherosclerotic heart disease of native coronary artery without angina pectoris: Secondary | ICD-10-CM

## 2016-03-27 LAB — CBC WITH DIFFERENTIAL/PLATELET
BASOS ABS: 73 {cells}/uL (ref 0–200)
Basophils Relative: 1 %
Eosinophils Absolute: 146 cells/uL (ref 15–500)
Eosinophils Relative: 2 %
HEMATOCRIT: 38.8 % (ref 38.5–50.0)
Hemoglobin: 12.8 g/dL — ABNORMAL LOW (ref 13.2–17.1)
LYMPHS PCT: 27 %
Lymphs Abs: 1971 cells/uL (ref 850–3900)
MCH: 30.8 pg (ref 27.0–33.0)
MCHC: 33 g/dL (ref 32.0–36.0)
MCV: 93.3 fL (ref 80.0–100.0)
MONO ABS: 876 {cells}/uL (ref 200–950)
MPV: 10 fL (ref 7.5–12.5)
Monocytes Relative: 12 %
NEUTROS PCT: 58 %
Neutro Abs: 4234 cells/uL (ref 1500–7800)
Platelets: 195 10*3/uL (ref 140–400)
RBC: 4.16 MIL/uL — AB (ref 4.20–5.80)
RDW: 14 % (ref 11.0–15.0)
WBC: 7.3 10*3/uL (ref 3.8–10.8)

## 2016-03-27 LAB — PROTIME-INR
INR: 1.1
Prothrombin Time: 11.3 s (ref 9.0–11.5)

## 2016-03-27 NOTE — Patient Instructions (Signed)
Medication Instructions:  Your physician recommends that you continue on your current medications as directed. Please refer to the Current Medication list given to you today.   Labwork: Lab work to be done today--BMP, CBC, PT  Testing/Procedures: Your physician has requested that you have a cardiac catheterization. Cardiac catheterization is used to diagnose and/or treat various heart conditions. Doctors may recommend this procedure for a number of different reasons. The most common reason is to evaluate chest pain. Chest pain can be a symptom of coronary artery disease (CAD), and cardiac catheterization can show whether plaque is narrowing or blocking your heart's arteries. This procedure is also used to evaluate the valves, as well as measure the blood flow and oxygen levels in different parts of your heart. For further information please visit [email or web address] Please follow instruction sheet, as given. Scheduled for September 29,2017    Follow-Up: To be determined after catheterization  Any Other Special Instructions Will Be Listed Below (If Applicable).     If you need a refill on your cardiac medications before your next appointment, please call your pharmacy.

## 2016-03-27 NOTE — Progress Notes (Signed)
 Chief Complaint  Patient presents with  . Shortness of Breath    History of Present Illness: 82 yo male with history of HTN, HLD, Diabetes, CAD s/p CABG and prior PCI, ischemic cardiomyopathy, diabetes, PAD with prior lower extremity interventions, LBBB and PVCS and known aortic stenosis who is referred to valve clinic today for further discussion regarding his aortic stenosis. He is followed in our office by Dr. Kelly. He has a long history of CAD dating back to 1991 when he had PTCA of his LAD. He had 5V CABG in 1998. In March of 2016 he had worsened fatigue and dyspnea and repeat cardiac cath showed patent LIMA to LAD, patent SVG to Diagonal, patent sequential SVG to PDA and PLA with severe disease in the SVG to OM. A drug eluting stent was placed in the SVG to the obtuse marginal. Echo May 2017 with LVEF=30-35% with moderate LVH, diffuse hypokinesis of the LV with moderate aortic stenosis with mean gradient of 22 mmHg and AVA 1.1cm2. There was concern for low output aortic stenosis. Dobutamine stress echo 03/20/16 with elevation of velocity across the aortic valve with infusion of dobutamine (3.86 m/sec with mean gradient 31 mm Hg, peak gradient 60 mmHg, AVA 1.0cm2). These findings likely represent severe aortic stenosis. He has known PAD with prior atherectomy of the left popliteal artery. His chronic systolic CHF has been managed with torsemide.   He tells me today that he has been having progressively worsening dyspnea, fatigue and chest pressure with exertion over the last 3-4 months. He is very active but lately has been unable to complete even simple chores around the house without becoming tired and dyspneic. His weight has been stable. His lower extremity edema is stable. He lives with his wife. His daughter and wife are here today. His daughter lives in [place], [place].   Primary Care Physician: [name], [name], MD   Past Medical History:  Diagnosis Date  . Age-related macular  degeneration, dry, both eyes   . Arthritis    "just about all over my body"  . Chronic combined systolic and diastolic CHF (congestive heart failure) (HCC)    a. 11/2015 Echo: EF 30-35%, diff HK, Gr1 DD.  . Coronary artery disease    a. 1991 PTCA of LAD; b. 1998 CABG x 5 (LIMA->LAD, VG->RPDA->RPLA, VG->OM1, VG->D2; c. 09/2014 MV: lat ischemia, EF 37%; d. 10/2014 PCI: LAD 100, LCX 99ost/100p, RCA 100p, LIMA->LAD ok, VG->D2 ok, VG->OM1 95(3.0x15 Resolute Integrity DES), VG->PDA ok w/ 30% w/in VG->PL.  . GERD (gastroesophageal reflux disease)   . High cholesterol   . History of hiatal hernia   . Hypertension   . Ischemic cardiomyopathy    a. 11/2015 Echo: EF 30-35%, diff HK, Gr1 DD.  . LBBB (left bundle branch block)   . Moderate aortic stenosis    a. 11/2015 Echo: EF 30-35%, diff HK, Gr1 DD, mod AS, mild AI, mildly dil Ao root and Asc Ao, mild MR, mod dil LA, PASP 46mmHg.  . Osteoporosis   . PAD (peripheral artery disease) (HCC)    a. s/p prior rotational atherectomy of L pop and tib trunk and PTA of focal popliteal lesions; b. 03/2015 ABI: R - 0.88, L 0.96, LCFA 50-79%, RSFA 50-79% distal stenosis, 3 vessel runoff.  . Palpitations    a. 06/2012 Event monitor: freq ventricular ectopy w/o VT.  . PVC (premature ventricular contraction) 04/26/1997   on beta blocker/pt had cardio net monitor 06/23/2012  . RLS (restless legs syndrome)   10/01/2012  . Type II diabetes mellitus (HCC)   . Unspecified hereditary and idiopathic peripheral neuropathy 10/01/2012    Past Surgical History:  Procedure Laterality Date  . APPENDECTOMY    . CARDIAC CATHETERIZATION  04/26/1997   CAD - 50-60% prox to mid LAD with 70% stenosis in mid-distal, 70-80% 1st diag stenosis, 50-60% ostial stenosis in large 2nd diagonal; diffuse RCA stenosis with 40-50% osital narrowing with 70% prox stenosis (Dr. T. Kelly) >> CABG  . CARDIAC CATHETERIZATION  11/01/2014  . CATARACT EXTRACTION W/ INTRAOCULAR LENS  IMPLANT, BILATERAL Bilateral    . CORONARY ARTERY BYPASS GRAFT  04/1997   CABGx5 - LIMA to LAD, SVG to PDA, SVG to PLA, VG to OM1, VG to 2nd diagonal (Dr. Hendrickson)  . DOPPLER ECHOCARDIOGRAPHY  05/18/2012   EF 45-50%, LV systolic function mildly reduced; borderline LA enlargment; mild mitral annular calcif, mild-mod MR; mild TR with normal RVSP; mild calcification of AV leaflets and mild-mod valvular AS with mild regurg      . DOPPLER ECHOCARDIOGRAPHY  08/19/2011   EF 50-55%, mod conc LVH; mild mitral annular calcif with mild MR; mild-mod TR; AV mildly sclerotic with mild valvular AS and mild regurg, mild aortic root dilatation  . HERNIA REPAIR    . HIATAL HERNIA REPAIR    . INGUINAL HERNIA REPAIR Bilateral   . LEFT AND RIGHT HEART CATHETERIZATION WITH CORONARY ANGIOGRAM N/A 11/01/2014   Procedure: LEFT AND RIGHT HEART CATHETERIZATION WITH CORONARY ANGIOGRAM;  Surgeon: Thomas A Kelly, MD; EF35-40%, mild AS w/ 2+ AI, LAD, CFX, RCA 100%, LIMA-LAD OK, SVG-D1 OK, SVG-PDA 30%, SVG-OM 95%  . LOWER EXTREMITY ANGIOGRAM  02/05/2011   Diamondback orbital rotational atherectomy, percutaneous transluminal angioplasty of high-grade calcified popliteal & tibioperoneal stenosis (Dr. J. Berry)  . LOWER EXTREMITY ARTERIAL DOPPLER  02/2012   RLE - mild to mod arterial insuff; RSFA 50-69% diameter reduction; R pop 50-69% diameter reduction; posterior tibial (R) demonstrates occlusive disease; LSFA/prox pop 0-49% diameter reduction; L distal pop 50-69% diameter reduction; posterior tibial (L) demonstrates occlusive disease  . NM MYOCAR PERF EJECTION FRACTION  09/2011   lexiscan - no reversible ischmai, fixed anteroseptal defect r/t LBBB; EF 51%; septal hypokinesis; low risk but abnormal  . PERCUTANEOUS CORONARY STENT INTERVENTION (PCI-S) N/A 11/02/2014   Procedure: PERCUTANEOUS CORONARY STENT INTERVENTION (PCI-S);  Surgeon: Thomas A Kelly, MD; 3.015 mm Resolute integrity DES to the SVG-OM   . TONSILLECTOMY      Current Outpatient Prescriptions    Medication Sig Dispense Refill  . amLODipine (NORVASC) 10 MG tablet Take 0.5 tablets (5 mg total) by mouth daily. KEEP OV. 90 tablet 0  . aspirin 81 MG tablet Take 81 mg by mouth daily.    . atorvastatin (LIPITOR) 40 MG tablet TAKE 1 TABLET DAILY AT 6 P.M. (KEEP OFFICE VISIT) 90 tablet 2  . clopidogrel (PLAVIX) 75 MG tablet TAKE 1 TABLET DAILY (KEEP OFFICE VISIT) 90 tablet 2  . gabapentin (NEURONTIN) 300 MG capsule Take 1 capsule (300 mg total) by mouth at bedtime. 90 capsule 3  . hydrALAZINE (APRESOLINE) 10 MG tablet Take 2 tablets (20 mg total) by mouth every 8 (eight) hours. 270 tablet 1  . metformin (FORTAMET) 1000 MG (OSM) 24 hr tablet Take 1,000 mg by mouth 2 (two) times daily with a meal.    . metoprolol (LOPRESSOR) 100 MG tablet Take 1 tablet (100 mg total) by mouth 2 (two) times daily. 180 tablet 1  . Multiple Vitamins-Minerals (PRESERVISION AREDS 2 PO) Take   1 capsule by mouth 2 (two) times daily.     . multivitamin-iron-minerals-folic acid (CENTRUM) chewable tablet Chew 1 tablet by mouth daily.    . niacin (NIASPAN) 750 MG CR tablet Take 2 tablets (1,500 mg total) by mouth at bedtime. 180 tablet 1  . nitroGLYCERIN (NITROSTAT) 0.4 MG SL tablet Place 1 tablet (0.4 mg total) under the tongue every 5 (five) minutes as needed for chest pain. 25 tablet 3  . oxymetazoline (AFRIN) 0.05 % nasal spray Place 2 sprays into both nostrils 3 (three) times daily as needed for congestion (Allergies).    . polycarbophil (FIBERCON) 625 MG tablet Take 625 mg by mouth daily.    . torsemide (DEMADEX) 20 MG tablet Take 2 tablets in the the morning and 1 tablet. 180 tablet 1  . zoledronic acid (RECLAST) 5 MG/100ML SOLN Inject 100 mLs (5 mg total) into the vein once. 100 mL 0   No current facility-administered medications for this visit.     Allergies  Allergen Reactions  . Other     Patient is allergic to something but doesn't know the name.  . Pramipexole Swelling    Social History   Social  History  . Marital status: Married    Spouse name: Sarah  . Number of children: 1  . Years of education: College   Occupational History  .  Retired   Social History Main Topics  . Smoking status: Never Smoker  . Smokeless tobacco: Never Used  . Alcohol use No  . Drug use: No  . Sexual activity: No   Other Topics Concern  . Not on file   Social History Narrative   Pt lives at home with his spouse.   Has been married 56yrs.   Caffeine Use:rarely, is right handed    Family History  Problem Relation Age of Onset  . CAD Mother   . Heart attack Father     Review of Systems:  As stated in the HPI and otherwise negative.   BP 120/90 (BP Location: Left Arm, Patient Position: Sitting, Cuff Size: Normal)   Pulse 64   Ht 5' 6" (1.676 m)   Wt 78.5 kg (173 lb)   BMI 27.92 kg/m   Physical Examination: General: Elderly white male in NAD.  HEENT: OP clear, mucus membranes moist  SKIN: warm, dry. No rashes. Neuro: No focal deficits  Musculoskeletal: Muscle strength 5/5 all ext  Psychiatric: Mood and affect normal  Neck: No JVD, no carotid bruits, no thyromegaly, no lymphadenopathy.  Lungs:Clear bilaterally, no wheezes, rhonci, crackles Cardiovascular: Regular rate and rhythm. Harsh systolic murmur noted. No gallops or rubs. Abdomen:Soft. Bowel sounds present. Non-tender.  Extremities: No lower extremity edema. Pulses are 1 + in the bilateral PT.  Echo 11/21/15: Left ventricle: The cavity size was normal. Wall thickness was   increased in a pattern of moderate LVH. Systolic function was   moderately to severely reduced. The estimated ejection fraction   was in the range of 30% to 35%. Diffuse hypokinesis. Doppler   parameters are consistent with abnormal left ventricular   relaxation (grade 1 diastolic dysfunction). Doppler parameters   are consistent with high ventricular filling pressure. - Aortic valve: Valve mobility was restricted. There was moderate   stenosis. There  was mild regurgitation. - Aortic root: The aortic root was mildly dilated. - Ascending aorta: The ascending aorta was mildly dilated. - Mitral valve: There was mild regurgitation. - Left atrium: The atrium was moderately dilated. - Right ventricle: The   cavity size was mildly dilated. - Right atrium: The atrium was mildly dilated. - Pulmonary arteries: Systolic pressure was moderately increased.   PA peak pressure: 46 mm Hg (S).  Echo 03/20/16: Impressions: Stage: Peak Vel Mean Gradient Peak Gradient AVA     Baseline 3.4 m/sec 25 mmHg 46 mmHg 1.02 cm2   Low Dose 3.56 m/sec 28 mmHg 51 mmHg .92 cm2   Mid Dose 3.74 m/sec 31 mmHg 56 mmHg 1.08 cm2   High Dose 3.86 m/sec 31 mmHg 60 mmHg 1.04 cm2     SV increased by 22% with Dobutamine infusion indictating some   contractile reserve   Overall findings consistent wtih moderate to severe fixed AS  Impressions:  - Stage: Peak Vel Mean Gradient Peak Gradient AVA     Baseline 3.4 m/sec 25 mmHg 46 mmHg 1.02 cm2   Low Dose 3.56 m/sec 28 mmHg 51 mmHg .92 cm2   Mid Dose 3.74 m/sec 31 mmHg 56 mmHg 1.08 cm2   High Dose 3.86 m/sec 31 mmHg 60 mmHg 1.04 cm2     SV increased by 22% with Dobutamine infusion indictating some   contractile reserve   Overall findings consistent wtih moderate to severe fixed AS  EKG:  EKG is not ordered today. The ekg from May 2017 with LBBB, sinus bradycardia.   Recent Labs: 06/26/2015: ALT 15 01/30/2016: Brain Natriuretic Peptide 299.6; BUN 20; Creat 0.82; Hemoglobin 12.1; Platelets 194; Potassium 4.8; Sodium 137; TSH 3.28   Lipid Panel    Component Value Date/Time   CHOL 135 09/28/2014 0933   TRIG 77 09/28/2014 0933   HDL 48 09/28/2014 0933   LDLCALC 72 09/28/2014 0933     Wt Readings from Last 3 Encounters:  03/27/16 78.5 kg (173 lb)  03/05/16 78.6 kg (173 lb 3.2 oz)  01/30/16 79.7 kg (175 lb 9.6 oz)     Other studies Reviewed: Additional studies/ records that were reviewed today include: . Review  of the above records demonstrates:    STS Risk Score: Risk of Mortality: 5.237%  Morbidity or Mortality: 26.362%  Long Length of Stay: 12.606%  Short Length of Stay: 17.258%  Permanent Stroke: 2.648%  Prolonged Ventilation: 14.865%  DSW Infection: 0.245%  Renal Failure: 6.852%  Reoperation: 11.875%    Assessment and Plan:   1. Severe aortic valve stenosis: He has stage D symptomatic aortic valve stenosis. The findings on the dobutamine stress echo are consistent with low output aortic stenosis. I have personally reviewed the echo images. The aortic valve is thickened, calcified with reduced leaflet excursion. By echo, there appears to be at least moderately severe AS.  I think at least some of his symptoms are related to his AS. He also has mild to moderate aortic valve insufficiency. He would benefit from AVR. Given advanced age and prior open heart surgery, I do not think he is a good candidate for conventional AVR by surgical approach. I think he may be a good candidate for TAVR. He is a very functional 82 yo patient. I have reviewed the TAVR procedure in detail today with the patient and his family. He would like to proceed with planning for TAVR. I will arrange a right and left heart catheterization at [facility] with Dr. Kelly who has followed his CAD for the last 26 years. If he has obstructive disease in the grafts, it would be best to treat this first and delay TAVR. If his CAD is stable, will proceed with planning for TAVR. Risks and benefits of   cath procedure and TAVR reviewed with the patient. After the cath, if appropriate, he will be referred to see one of the CT surgeons on our TAVR team. He will then have Cardiac CT and CTA of the chest/abd/pelvis, PFTs and carotid dopplers.     Current medicines are reviewed at length with the patient today.  The patient does not have concerns regarding medicines.  The following changes have been made:  no change  Labs/ tests ordered today include:    Orders Placed This Encounter  Procedures  . Basic Metabolic Panel (BMET)  . CBC w/Diff  . INR/PT  right and left heart cath   Disposition:   FU with TAVR team post cath    Signed, Christopher McAlhany, MD 03/27/2016 5:02 PM    [facility] [address], , [place]  [address] Phone: [phone]; Fax: [phone]    

## 2016-03-28 LAB — BASIC METABOLIC PANEL
BUN: 20 mg/dL (ref 7–25)
CALCIUM: 10.3 mg/dL (ref 8.6–10.3)
CO2: 29 mmol/L (ref 20–31)
Chloride: 97 mmol/L — ABNORMAL LOW (ref 98–110)
Creat: 0.86 mg/dL (ref 0.70–1.11)
Glucose, Bld: 113 mg/dL — ABNORMAL HIGH (ref 65–99)
Potassium: 4.8 mmol/L (ref 3.5–5.3)
SODIUM: 138 mmol/L (ref 135–146)

## 2016-04-05 ENCOUNTER — Encounter (HOSPITAL_COMMUNITY)
Admission: RE | Disposition: A | Discharge status: Home / Self Care | Payer: Self-pay | Source: Ambulatory Visit | Attending: Cardiovascular Disease

## 2016-04-05 ENCOUNTER — Encounter (HOSPITAL_COMMUNITY): Payer: Self-pay | Admitting: *Deleted

## 2016-04-05 ENCOUNTER — Ambulatory Visit (HOSPITAL_COMMUNITY)
Admission: RE | Admit: 2016-04-05 | Discharge: 2016-04-05 | Disposition: A | Discharge status: Home / Self Care | Payer: Medicare Other | Source: Ambulatory Visit | Attending: Cardiovascular Disease | Admitting: Cardiovascular Disease

## 2016-04-05 DIAGNOSIS — Z7902 Long term (current) use of antithrombotics/antiplatelets: Secondary | ICD-10-CM | POA: Insufficient documentation

## 2016-04-05 DIAGNOSIS — I2510 Atherosclerotic heart disease of native coronary artery without angina pectoris: Secondary | ICD-10-CM | POA: Insufficient documentation

## 2016-04-05 DIAGNOSIS — G2581 Restless legs syndrome: Secondary | ICD-10-CM | POA: Insufficient documentation

## 2016-04-05 DIAGNOSIS — I350 Nonrheumatic aortic (valve) stenosis: Secondary | ICD-10-CM

## 2016-04-05 DIAGNOSIS — E1151 Type 2 diabetes mellitus with diabetic peripheral angiopathy without gangrene: Secondary | ICD-10-CM | POA: Insufficient documentation

## 2016-04-05 DIAGNOSIS — Z7984 Long term (current) use of oral hypoglycemic drugs: Secondary | ICD-10-CM | POA: Insufficient documentation

## 2016-04-05 DIAGNOSIS — M810 Age-related osteoporosis without current pathological fracture: Secondary | ICD-10-CM | POA: Insufficient documentation

## 2016-04-05 DIAGNOSIS — Z951 Presence of aortocoronary bypass graft: Secondary | ICD-10-CM | POA: Insufficient documentation

## 2016-04-05 DIAGNOSIS — E1142 Type 2 diabetes mellitus with diabetic polyneuropathy: Secondary | ICD-10-CM | POA: Diagnosis not present

## 2016-04-05 DIAGNOSIS — R0602 Shortness of breath: Secondary | ICD-10-CM | POA: Diagnosis present

## 2016-04-05 DIAGNOSIS — E7800 Pure hypercholesterolemia, unspecified: Secondary | ICD-10-CM | POA: Insufficient documentation

## 2016-04-05 DIAGNOSIS — E785 Hyperlipidemia, unspecified: Secondary | ICD-10-CM | POA: Insufficient documentation

## 2016-04-05 DIAGNOSIS — Z7982 Long term (current) use of aspirin: Secondary | ICD-10-CM | POA: Diagnosis not present

## 2016-04-05 DIAGNOSIS — Z7983 Long term (current) use of bisphosphonates: Secondary | ICD-10-CM | POA: Insufficient documentation

## 2016-04-05 DIAGNOSIS — I255 Ischemic cardiomyopathy: Secondary | ICD-10-CM | POA: Insufficient documentation

## 2016-04-05 DIAGNOSIS — Z955 Presence of coronary angioplasty implant and graft: Secondary | ICD-10-CM | POA: Diagnosis not present

## 2016-04-05 DIAGNOSIS — I110 Hypertensive heart disease with heart failure: Secondary | ICD-10-CM | POA: Insufficient documentation

## 2016-04-05 DIAGNOSIS — I5022 Chronic systolic (congestive) heart failure: Secondary | ICD-10-CM | POA: Diagnosis not present

## 2016-04-05 DIAGNOSIS — I2582 Chronic total occlusion of coronary artery: Secondary | ICD-10-CM | POA: Diagnosis not present

## 2016-04-05 HISTORY — PX: CARDIAC CATHETERIZATION: SHX172

## 2016-04-05 LAB — POCT I-STAT 3, ART BLOOD GAS (G3+)
Acid-Base Excess: 3 mmol/L — ABNORMAL HIGH (ref 0.0–2.0)
BICARBONATE: 29.5 mmol/L — AB (ref 20.0–28.0)
O2 Saturation: 98 %
PH ART: 7.374 (ref 7.350–7.450)
PO2 ART: 103 mmHg (ref 83.0–108.0)
TCO2: 31 mmol/L (ref 0–100)
pCO2 arterial: 50.6 mmHg — ABNORMAL HIGH (ref 32.0–48.0)

## 2016-04-05 LAB — POCT I-STAT 3, VENOUS BLOOD GAS (G3P V)
ACID-BASE EXCESS: 3 mmol/L — AB (ref 0.0–2.0)
Bicarbonate: 29.6 mmol/L — ABNORMAL HIGH (ref 20.0–28.0)
O2 SAT: 76 %
PCO2 VEN: 53.2 mmHg (ref 44.0–60.0)
PO2 VEN: 43 mmHg (ref 32.0–45.0)
TCO2: 31 mmol/L (ref 0–100)
pH, Ven: 7.353 (ref 7.250–7.430)

## 2016-04-05 LAB — GLUCOSE, CAPILLARY: GLUCOSE-CAPILLARY: 138 mg/dL — AB (ref 65–99)

## 2016-04-05 SURGERY — RIGHT/LEFT HEART CATH AND CORONARY/GRAFT ANGIOGRAPHY
Anesthesia: LOCAL

## 2016-04-05 MED ORDER — HEPARIN (PORCINE) IN NACL 2-0.9 UNIT/ML-% IJ SOLN
INTRAMUSCULAR | Status: AC
  Filled 2016-04-05: qty 1000

## 2016-04-05 MED ORDER — ASPIRIN 81 MG PO CHEW
CHEWABLE_TABLET | ORAL | Status: AC
  Administered 2016-04-05: 81 mg via ORAL
  Filled 2016-04-05: qty 1

## 2016-04-05 MED ORDER — SODIUM CHLORIDE 0.9 % IV SOLN: 250.0000 mL | INTRAVENOUS | Status: DC | PRN

## 2016-04-05 MED ORDER — LIDOCAINE HCL (PF) 1 % IJ SOLN
INTRAMUSCULAR | Status: DC | PRN
  Administered 2016-04-05: 20 mL via INTRADERMAL

## 2016-04-05 MED ORDER — IOPAMIDOL (ISOVUE-370) INJECTION 76%
INTRAVENOUS | Status: AC
  Filled 2016-04-05: qty 125

## 2016-04-05 MED ORDER — FENTANYL CITRATE (PF) 100 MCG/2ML IJ SOLN
INTRAMUSCULAR | Status: AC
  Filled 2016-04-05: qty 2

## 2016-04-05 MED ORDER — MIDAZOLAM HCL 2 MG/2ML IJ SOLN
INTRAMUSCULAR | Status: DC | PRN
  Administered 2016-04-05: 1 mg via INTRAVENOUS

## 2016-04-05 MED ORDER — HEPARIN (PORCINE) IN NACL 2-0.9 UNIT/ML-% IJ SOLN
INTRAMUSCULAR | Status: DC | PRN
  Administered 2016-04-05: 1500 mL via INTRA_ARTERIAL

## 2016-04-05 MED ORDER — LIDOCAINE HCL (PF) 1 % IJ SOLN
INTRAMUSCULAR | Status: AC
  Filled 2016-04-05: qty 30

## 2016-04-05 MED ORDER — SODIUM CHLORIDE 0.9 % IV SOLN
INTRAVENOUS | Status: DC
  Administered 2016-04-05: 08:00:00 via INTRAVENOUS

## 2016-04-05 MED ORDER — MIDAZOLAM HCL 2 MG/2ML IJ SOLN
INTRAMUSCULAR | Status: AC
  Filled 2016-04-05: qty 2

## 2016-04-05 MED ORDER — HEPARIN (PORCINE) IN NACL 2-0.9 UNIT/ML-% IJ SOLN
INTRAMUSCULAR | Status: AC
  Filled 2016-04-05: qty 500

## 2016-04-05 MED ORDER — FENTANYL CITRATE (PF) 100 MCG/2ML IJ SOLN
INTRAMUSCULAR | Status: DC | PRN
  Administered 2016-04-05: 25 ug via INTRAVENOUS

## 2016-04-05 MED ORDER — SODIUM CHLORIDE 0.9% FLUSH: 3.0000 mL | Freq: Two times a day (BID) | INTRAVENOUS | Status: DC

## 2016-04-05 MED ORDER — ASPIRIN 81 MG PO CHEW
81.0000 mg | CHEWABLE_TABLET | ORAL | Status: AC
Start: 2016-04-06 — End: 2016-04-05
  Administered 2016-04-05: 81 mg via ORAL

## 2016-04-05 MED ORDER — IOPAMIDOL (ISOVUE-370) INJECTION 76%
INTRAVENOUS | Status: AC
  Filled 2016-04-05: qty 50

## 2016-04-05 MED ORDER — SODIUM CHLORIDE 0.9% FLUSH: 3.0000 mL | INTRAVENOUS | Status: DC | PRN

## 2016-04-05 MED ORDER — SODIUM CHLORIDE 0.9 % IV SOLN: INTRAVENOUS | Status: DC

## 2016-04-05 SURGICAL SUPPLY — 19 items
CATH IMPULSE 5F ANG/FL3.5 (CATHETERS) ×1 IMPLANT
CATH INFINITI 5 FR IM (CATHETERS) ×1 IMPLANT
CATH INFINITI 5FR JL5 (CATHETERS) ×1 IMPLANT
CATH SWAN GANZ 7F STRAIGHT (CATHETERS) ×1 IMPLANT
CATH SWAN GANZ 7FR S TIP (CATHETERS) ×1 IMPLANT
GUIDEWIRE ANGLED .035X150CM (WIRE) ×1 IMPLANT
KIT HEART LEFT (KITS) ×2 IMPLANT
PACK CARDIAC CATHETERIZATION (CUSTOM PROCEDURE TRAY) ×2 IMPLANT
SHEATH PINNACLE 5F 10CM (SHEATH) ×1 IMPLANT
SHEATH PINNACLE 7F 10CM (SHEATH) ×1 IMPLANT
SYR MEDRAD MARK V 150ML (SYRINGE) ×2 IMPLANT
TRANSDUCER W/STOPCOCK (MISCELLANEOUS) ×4 IMPLANT
TUBING ART PRESS 72  MALE/FEM (TUBING) ×1
TUBING ART PRESS 72 MALE/FEM (TUBING) IMPLANT
TUBING CIL FLEX 10 FLL-RA (TUBING) ×2 IMPLANT
WIRE EMERALD 3MM-J .025X260CM (WIRE) ×1 IMPLANT
WIRE EMERALD 3MM-J .035X150CM (WIRE) ×1 IMPLANT
WIRE EMERALD ST .035X260CM (WIRE) ×1 IMPLANT
WIRE J 3MM .035X260CM (WIRE) ×1 IMPLANT

## 2016-04-05 NOTE — Progress Notes (Signed)
Assumed care of pt from Debbie Hedge, RN. Assessment unchanged. 

## 2016-04-05 NOTE — Progress Notes (Signed)
Site area: rt groin fa and fv sheaths Site Prior to Removal:  Level 0 Pressure Applied For:  20 minutes Manual:   yes Patient Status During Pull:  stable Post Pull Site:  Level  0 Post Pull Instructions Given:  yes Post Pull Pulses Present: yes Dressing Applied:  tegaderm Bedrest begins @  1115 Comments:

## 2016-04-05 NOTE — H&P (View-Only) (Signed)
Chief Complaint  Patient presents with  . Shortness of Breath    History of Present Illness: 82 yo male with history of HTN, HLD, Diabetes, CAD s/p CABG and prior PCI, ischemic cardiomyopathy, diabetes, PAD with prior lower extremity interventions, LBBB and PVCS and known aortic stenosis who is referred to valve clinic today for further discussion regarding his aortic stenosis. He is followed in our office by Dr. Tresa Endo. He has a long history of CAD dating back to 35 when he had PTCA of his LAD. He had 5V CABG in 1998. In March of 2016 he had worsened fatigue and dyspnea and repeat cardiac cath showed patent LIMA to LAD, patent SVG to Diagonal, patent sequential SVG to PDA and PLA with severe disease in the SVG to OM. A drug eluting stent was placed in the SVG to the obtuse marginal. Echo May 2017 with LVEF=30-35% with moderate LVH, diffuse hypokinesis of the LV with moderate aortic stenosis with mean gradient of 22 mmHg and AVA 1.1cm2. There was concern for low output aortic stenosis. Dobutamine stress echo 03/20/16 with elevation of velocity across the aortic valve with infusion of dobutamine (3.86 m/sec with mean gradient 31 mm Hg, peak gradient 60 mmHg, AVA 1.0cm2). These findings likely represent severe aortic stenosis. He has known PAD with prior atherectomy of the left popliteal artery. His chronic systolic CHF has been managed with torsemide.   He tells me today that he has been having progressively worsening dyspnea, fatigue and chest pressure with exertion over the last 3-4 months. He is very active but lately has been unable to complete even simple chores around the house without becoming tired and dyspneic. His weight has been stable. His lower extremity edema is stable. He lives with his wife. His daughter and wife are here today. His daughter lives in [place], [place].   Primary Care Physician: Colette Ribas, MD   Past Medical History:  Diagnosis Date  . Age-related macular  degeneration, dry, both eyes   . Arthritis    "just about all over my body"  . Chronic combined systolic and diastolic CHF (congestive heart failure) (HCC)    a. 11/2015 Echo: EF 30-35%, diff HK, Gr1 DD.  Marland Kitchen Coronary artery disease    a. 1991 PTCA of LAD; b. 1998 CABG x 5 (LIMA->LAD, VG->RPDA->RPLA, VG->OM1, VG->D2; c. 09/2014 MV: lat ischemia, EF 37%; d. 10/2014 PCI: LAD 100, LCX 99ost/100p, RCA 100p, LIMA->LAD ok, VG->D2 ok, VG->OM1 95(3.0x15 Resolute Integrity DES), VG->PDA ok w/ 30% w/in VG->PL.  Marland Kitchen GERD (gastroesophageal reflux disease)   . High cholesterol   . History of hiatal hernia   . Hypertension   . Ischemic cardiomyopathy    a. 11/2015 Echo: EF 30-35%, diff HK, Gr1 DD.  Marland Kitchen LBBB (left bundle branch block)   . Moderate aortic stenosis    a. 11/2015 Echo: EF 30-35%, diff HK, Gr1 DD, mod AS, mild AI, mildly dil Ao root and Asc Ao, mild MR, mod dil LA, PASP .  . Osteoporosis   . PAD (peripheral artery disease) (HCC)    a. s/p prior rotational atherectomy of L pop and tib trunk and PTA of focal popliteal lesions; b. 03/2015 ABI: R - 0.88, L 0.96, LCFA 50-79%, RSFA 50-79% distal stenosis, 3 vessel runoff.  . Palpitations    a. 06/2012 Event monitor: freq ventricular ectopy w/o VT.  Marland Kitchen PVC (premature ventricular contraction) 04/26/1997   on beta blocker/pt had cardio net monitor 06/23/2012  . RLS (restless legs syndrome)  10/01/2012  . Type II diabetes mellitus (HCC)   . Unspecified hereditary and idiopathic peripheral neuropathy 10/01/2012    Past Surgical History:  Procedure Laterality Date  . APPENDECTOMY    . CARDIAC CATHETERIZATION  04/26/1997   CAD - 50-60% prox to mid LAD with 70% stenosis in mid-distal, 70-80% 1st diag stenosis, 50-60% ostial stenosis in large 2nd diagonal; diffuse RCA stenosis with 40-50% osital narrowing with 70% prox stenosis (Dr. Bishop Limbo) >> CABG  . CARDIAC CATHETERIZATION  11/01/2014  . CATARACT EXTRACTION W/ INTRAOCULAR LENS  IMPLANT, BILATERAL Bilateral    . CORONARY ARTERY BYPASS GRAFT  04/1997   CABGx5 - LIMA to LAD, SVG to PDA, SVG to PLA, VG to OM1, VG to 2nd diagonal (Dr. Dorris Fetch)  . DOPPLER ECHOCARDIOGRAPHY  05/18/2012   EF 45-50%, LV systolic function mildly reduced; borderline LA enlargment; mild mitral annular calcif, mild-mod MR; mild TR with normal RVSP; mild calcification of AV leaflets and mild-mod valvular AS with mild regurg      . DOPPLER ECHOCARDIOGRAPHY  08/19/2011   EF 50-55%, mod conc LVH; mild mitral annular calcif with mild MR; mild-mod TR; AV mildly sclerotic with mild valvular AS and mild regurg, mild aortic root dilatation  . HERNIA REPAIR    . HIATAL HERNIA REPAIR    . INGUINAL HERNIA REPAIR Bilateral   . LEFT AND RIGHT HEART CATHETERIZATION WITH CORONARY ANGIOGRAM N/A 11/01/2014   Procedure: LEFT AND RIGHT HEART CATHETERIZATION WITH CORONARY ANGIOGRAM;  Surgeon: Lennette Bihari, MD; EF35-40%, mild AS w/ 2+ AI, LAD, CFX, RCA 100%, LIMA-LAD OK, SVG-D1 OK, SVG-PDA 30%, SVG-OM 95%  . LOWER EXTREMITY ANGIOGRAM  02/05/2011   Diamondback orbital rotational atherectomy, percutaneous transluminal angioplasty of high-grade calcified popliteal & tibioperoneal stenosis (Dr. Erlene Quan)  . LOWER EXTREMITY ARTERIAL DOPPLER  02/2012   RLE - mild to mod arterial insuff; RSFA 50-69% diameter reduction; R pop 50-69% diameter reduction; posterior tibial (R) demonstrates occlusive disease; LSFA/prox pop 0-49% diameter reduction; L distal pop 50-69% diameter reduction; posterior tibial (L) demonstrates occlusive disease  . NM MYOCAR PERF EJECTION FRACTION  09/2011   lexiscan - no reversible ischmai, fixed anteroseptal defect r/t LBBB; EF 51%; septal hypokinesis; low risk but abnormal  . PERCUTANEOUS CORONARY STENT INTERVENTION (PCI-S) N/A 11/02/2014   Procedure: PERCUTANEOUS CORONARY STENT INTERVENTION (PCI-S);  Surgeon: Lennette Bihari, MD; 3.015 mm Resolute integrity DES to the SVG-OM   . TONSILLECTOMY      Current Outpatient Prescriptions    Medication Sig Dispense Refill  . amLODipine (NORVASC) 10 MG tablet Take 0.5 tablets (5 mg total) by mouth daily. KEEP OV. 90 tablet 0  . aspirin 81 MG tablet Take 81 mg by mouth daily.    Marland Kitchen atorvastatin (LIPITOR) 40 MG tablet TAKE 1 TABLET DAILY AT 6 P.M. (KEEP OFFICE VISIT) 90 tablet 2  . clopidogrel (PLAVIX) 75 MG tablet TAKE 1 TABLET DAILY (KEEP OFFICE VISIT) 90 tablet 2  . gabapentin (NEURONTIN) 300 MG capsule Take 1 capsule (300 mg total) by mouth at bedtime. 90 capsule 3  . hydrALAZINE (APRESOLINE) 10 MG tablet Take 2 tablets (20 mg total) by mouth every 8 (eight) hours. 270 tablet 1  . metformin (FORTAMET) 1000 MG (OSM) 24 hr tablet Take 1,000 mg by mouth 2 (two) times daily with a meal.    . metoprolol (LOPRESSOR) 100 MG tablet Take 1 tablet (100 mg total) by mouth 2 (two) times daily. 180 tablet 1  . Multiple Vitamins-Minerals (PRESERVISION AREDS 2 PO) Take  1 capsule by mouth 2 (two) times daily.     . multivitamin-iron-minerals-folic acid (CENTRUM) chewable tablet Chew 1 tablet by mouth daily.    . niacin (NIASPAN) 750 MG CR tablet Take 2 tablets (1,500 mg total) by mouth at bedtime. 180 tablet 1  . nitroGLYCERIN (NITROSTAT) 0.4 MG SL tablet Place 1 tablet (0.4 mg total) under the tongue every 5 (five) minutes as needed for chest pain. 25 tablet 3  . oxymetazoline (AFRIN) 0.05 % nasal spray Place 2 sprays into both nostrils 3 (three) times daily as needed for congestion (Allergies).    . polycarbophil (FIBERCON) 625 MG tablet Take 625 mg by mouth daily.    Marland Kitchen torsemide (DEMADEX) 20 MG tablet Take 2 tablets in the the morning and 1 tablet. 180 tablet 1  . zoledronic acid (RECLAST) 5 MG/100ML SOLN Inject 100 mLs (5 mg total) into the vein once. 100 mL 0   No current facility-administered medications for this visit.     Allergies  Allergen Reactions  . Other     Patient is allergic to something but doesn't know the name.  . Pramipexole Swelling    Social History   Social  History  . Marital status: Married    Spouse name: Maralyn Sago  . Number of children: 1  . Years of education: College   Occupational History  .  Retired   Social History Main Topics  . Smoking status: Never Smoker  . Smokeless tobacco: Never Used  . Alcohol use No  . Drug use: No  . Sexual activity: No   Other Topics Concern  . Not on file   Social History Narrative   Pt lives at home with his spouse.   Has been married 52yrs.   Caffeine WJX:BJYNWG, is right handed    Family History  Problem Relation Age of Onset  . CAD Mother   . Heart attack Father     Review of Systems:  As stated in the HPI and otherwise negative.   BP 120/90 (BP Location: Left Arm, Patient Position: Sitting, Cuff Size: Normal)   Pulse 64   Ht 5\' 6"  (1.676 m)   Wt 78.5 kg (173 lb)   BMI 27.92 kg/m   Physical Examination: General: Elderly white male in NAD.  HEENT: OP clear, mucus membranes moist  SKIN: warm, dry. No rashes. Neuro: No focal deficits  Musculoskeletal: Muscle strength 5/5 all ext  Psychiatric: Mood and affect normal  Neck: No JVD, no carotid bruits, no thyromegaly, no lymphadenopathy.  Lungs:Clear bilaterally, no wheezes, rhonci, crackles Cardiovascular: Regular rate and rhythm. Harsh systolic murmur noted. No gallops or rubs. Abdomen:Soft. Bowel sounds present. Non-tender.  Extremities: No lower extremity edema. Pulses are 1 + in the bilateral PT.  Echo 11/21/15: Left ventricle: The cavity size was normal. Wall thickness was   increased in a pattern of moderate LVH. Systolic function was   moderately to severely reduced. The estimated ejection fraction   was in the range of 30% to 35%. Diffuse hypokinesis. Doppler   parameters are consistent with abnormal left ventricular   relaxation (grade 1 diastolic dysfunction). Doppler parameters   are consistent with high ventricular filling pressure. - Aortic valve: Valve mobility was restricted. There was moderate   stenosis. There  was mild regurgitation. - Aortic root: The aortic root was mildly dilated. - Ascending aorta: The ascending aorta was mildly dilated. - Mitral valve: There was mild regurgitation. - Left atrium: The atrium was moderately dilated. - Right ventricle: The  cavity size was mildly dilated. - Right atrium: The atrium was mildly dilated. - Pulmonary arteries: Systolic pressure was moderately increased.   PA peak pressure: 46 mm Hg (S).  Echo 03/20/16: Impressions: Stage: Peak Vel Mean Gradient Peak Gradient AVA     Baseline 3.4 m/sec 25 mmHg 46 mmHg 1.02 cm2   Low Dose 3.56 m/sec 28 mmHg 51 mmHg .92 cm2   Mid Dose 3.74 m/sec 31 mmHg 56 mmHg 1.08 cm2   High Dose 3.86 m/sec 31 mmHg 60 mmHg 1.04 cm2     SV increased by 22% with Dobutamine infusion indictating some   contractile reserve   Overall findings consistent wtih moderate to severe fixed AS  Impressions:  - Stage: Peak Vel Mean Gradient Peak Gradient AVA     Baseline 3.4 m/sec 25 mmHg 46 mmHg 1.02 cm2   Low Dose 3.56 m/sec 28 mmHg 51 mmHg .92 cm2   Mid Dose 3.74 m/sec 31 mmHg 56 mmHg 1.08 cm2   High Dose 3.86 m/sec 31 mmHg 60 mmHg 1.04 cm2     SV increased by 22% with Dobutamine infusion indictating some   contractile reserve   Overall findings consistent wtih moderate to severe fixed AS  EKG:  EKG is not ordered today. The ekg from May 2017 with LBBB, sinus bradycardia.   Recent Labs: 06/26/2015: ALT 15 01/30/2016: Brain Natriuretic Peptide 299.6; BUN 20; Creat 0.82; Hemoglobin 12.1; Platelets 194; Potassium 4.8; Sodium 137; TSH 3.28   Lipid Panel    Component Value Date/Time   CHOL 135 09/28/2014 0933   TRIG 77 09/28/2014 0933   HDL 48 09/28/2014 0933   LDLCALC 72 09/28/2014 0933     Wt Readings from Last 3 Encounters:  03/27/16 78.5 kg (173 lb)  03/05/16 78.6 kg (173 lb 3.2 oz)  01/30/16 79.7 kg (175 lb 9.6 oz)     Other studies Reviewed: Additional studies/ records that were reviewed today include: . Review  of the above records demonstrates:    STS Risk Score: Risk of Mortality: 5.237%  Morbidity or Mortality: 26.362%  Long Length of Stay: 12.606%  Short Length of Stay: 17.258%  Permanent Stroke: 2.648%  Prolonged Ventilation: 14.865%  DSW Infection: 0.245%  Renal Failure: 6.852%  Reoperation: 11.875%    Assessment and Plan:   1. Severe aortic valve stenosis: He has stage D symptomatic aortic valve stenosis. The findings on the dobutamine stress echo are consistent with low output aortic stenosis. I have personally reviewed the echo images. The aortic valve is thickened, calcified with reduced leaflet excursion. By echo, there appears to be at least moderately severe AS.  I think at least some of his symptoms are related to his AS. He also has mild to moderate aortic valve insufficiency. He would benefit from AVR. Given advanced age and prior open heart surgery, I do not think he is a good candidate for conventional AVR by surgical approach. I think he may be a good candidate for TAVR. He is a very functional 82 yo patient. I have reviewed the TAVR procedure in detail today with the patient and his family. He would like to proceed with planning for TAVR. I will [facility] with Dr. Tresa EndoKelly who has followed his CAD for the last 26 years. If he has obstructive disease in the grafts, it would be best to treat this first and delay TAVR. If his CAD is stable, will proceed with planning for TAVR. Risks and benefits of  cath procedure and TAVR reviewed with the patient. After the cath, if appropriate, he will be referred to see one of the CT surgeons on our TAVR team. He will then have Cardiac CT and CTA of the chest/abd/pelvis, PFTs and carotid dopplers.     Current medicines are reviewed at length with the patient today.  The patient does not have concerns regarding medicines.  The following changes have been made:  no change  Labs/ tests ordered today include:    Orders Placed This Encounter  Procedures  . Basic Metabolic Panel (BMET)  . CBC w/Diff  . INR/PT  right and left heart cath   Disposition:   FU with TAVR team post cath    Signed, Verne Carrow, MD 03/27/2016 5:02 PM    [facility] [address] [place], [place], [place]  [address] Phone: [phone]; Fax: [phone]

## 2016-04-05 NOTE — Discharge Instructions (Signed)
Angiogram, Care After These instructions give you information about caring for yourself after your procedure. Your doctor may also give you more specific instructions. Call your doctor if you have any problems or questions after your procedure.  HOME CARE:          Increase your fluid intake to help rid the body of the dye  Take medicines only as told by your doctor.  Follow your doctor's instructions about:  Care of the area where the tube was inserted.  Bandage (dressing) changes and removal.  You remove bandage and shower 24  hours after the procedure or as told by your doctor. Do not scrub area and pat dry.  Do not take baths, swim, or use a hot tub until your doctor approves.  Every day, check the area where the tube was inserted. Watch for:  If you have bleeding from the area where the catheter was inserted, hold pressure for 15-20 minutes and call 911  Redness, swelling, or pain.  Fluid, blood, or pus.  Do not apply powder or lotion to the site.  Do not lift anything that is heavier than 10 lb (4.5 kg) for 5 days or as told by your doctor.  Ask your doctor when you can:  Return to work or school.  Do physical activities or play sports.  Have sex.  Do not drive or operate heavy machinery for 24 hours or as told by your doctor.  Have someone with you for the first 24 hours after the procedure.  Keep all follow-up visits as told by your doctor. This is important. GET HELP IF:  You have a fever.   You have chills.   BYou have redness, swelling, or pain in the area where the tube was inserted.  You have fluid or pus coming from the area. GET HELP RIGHT AWAY IF:   You have a lot of pain in the area where the tube was inserted.  The area where the tube was inserted is bleeding, and the bleeding does not stop after 30 minutes of holding steady pressure on the area.  The area near or just beyond the insertion site becomes pale, cool, tingly, or numb.   This  information is not intended to replace advice given to you by your health care provider. Make sure you discuss any questions you have with your health care provider.   Document Released: 09/20/2008 Document Revised: 07/15/2014 Document Reviewed: 11/25/2012 Elsevier Interactive Patient Education [employer]2016 [employer]vier Inc.

## 2016-04-05 NOTE — Interval H&P Note (Signed)
Cath Lab Visit (complete for each Cath Lab visit)  Clinical Evaluation Leading to the Procedure:   ACS: No.  Non-ACS:    Anginal Classification: CCS III  Anti-ischemic medical therapy: Maximal Therapy (2 or more classes of medications)  Non-Invasive Test Results: No non-invasive testing performed  Prior CABG: Previous CABG      History and Physical Interval Note:  04/05/2016 9:04 AM  Melvin [name]  has presented today for surgery, with the diagnosis of aortic stenosis, CAD  The various methods of treatment have been discussed with the patient and family. After consideration of risks, benefits and other options for treatment, the patient has consented to  Procedure(s): Right/Left Heart Cath and Coronary/Graft Angiography (N/A) as a surgical intervention .  The patient's history has been reviewed, patient examined, no change in status, stable for surgery.  I have reviewed the patient's chart and labs.  Questions were answered to the patient's satisfaction.     Nicki Guadalajarahomas Kelly

## 2016-04-10 ENCOUNTER — Encounter: Payer: Self-pay | Admitting: Surgery

## 2016-04-10 ENCOUNTER — Institutional Professional Consult (permissible substitution) (INDEPENDENT_AMBULATORY_CARE_PROVIDER_SITE_OTHER): Payer: Medicare Other | Admitting: Surgery

## 2016-04-10 ENCOUNTER — Other Ambulatory Visit: Payer: Self-pay | Admitting: *Deleted

## 2016-04-10 VITALS — BP 143/66 | HR 62 | Resp 16 | Ht 66.0 in | Wt 173.0 lb

## 2016-04-10 DIAGNOSIS — I350 Nonrheumatic aortic (valve) stenosis: Secondary | ICD-10-CM

## 2016-04-10 DIAGNOSIS — I2510 Atherosclerotic heart disease of native coronary artery without angina pectoris: Secondary | ICD-10-CM | POA: Diagnosis not present

## 2016-04-12 ENCOUNTER — Encounter: Payer: Self-pay | Admitting: Surgery

## 2016-04-12 NOTE — Progress Notes (Signed)
Patient ID: Melvin [name], male   DOB: September 02, 1927, 82 y.o.   MRN: [id number]   [facility]  MULTIDISCIPLINARY HEART VALVE CLINIC  CARDIOTHORACIC SURGERY CONSULTATION REPORT  Referring Provider is Lennette Bihari, MD PCP is Colette Ribas, MD  Chief Complaint  Patient presents with  . Aortic Stenosis    Surgical eval for TAVR, cardiac cath 04/05/16, stress ECHO 03/20/16,    HPI:  The patient is an 82 year old gentleman with a history of hypertension, hyperlipidemia, diabetes, PVD and coronary artery disease, s/p CABG x 5 in 1998 by Dr. Dorris Fetch. He has been followed by Dr. Tresa Endo. In March 2016 he presented with worsening fatigue and shortness of breath and cath showed a high grade stenosis in the SVG to the OM which was treated with a DES. He has know ischemic cardiomyopathy with an LVEF of 30-35% by echo in 11/2015. He was also noted to have moderate AS at that time with a mean gradient of 22 mm Hg and an AVA of 1.1 cm2. He recently presented with progressively worsening shortness of breath and fatigue as well as some chest pressure over the past 3-4 months. He says that he is not doing as much activity as he had been due to getting symptoms with mild exertion. He lives at home with his wife and can do easy things around the house and gets out to the grocery store since his wife can't do that. He had a dobutamine stress test on 03/20/2016 and at high dose the peak velocity went up to 3.86 with a mean gradient of 31 mm Hg and an AVA of 1.04 cm2. His SV increased by 22% with dobutamine. He underwent cath on 04/05/2016 showing severe native multi-vessel CAD with patent grafts and no areas of significant ischemia. There was 3+ AI. Right heart pressures were normal and CO was 5.9 L/min. The AV mean gradient was measured at 17.6 mm Hg.    Past Medical History:  Diagnosis Date  . Age-related macular degeneration, dry, both eyes   . Arthritis    "just about all over my body"    . Chronic combined systolic and diastolic CHF (congestive heart failure) (HCC)    a. 11/2015 Echo: EF 30-35%, diff HK, Gr1 DD.  Marland Kitchen Coronary artery disease    a. 1991 PTCA of LAD; b. 1998 CABG x 5 (LIMA->LAD, VG->RPDA->RPLA, VG->OM1, VG->D2; c. 09/2014 MV: lat ischemia, EF 37%; d. 10/2014 PCI: LAD 100, LCX 99ost/100p, RCA 100p, LIMA->LAD ok, VG->D2 ok, VG->OM1 95(3.0x15 Resolute Integrity DES), VG->PDA ok w/ 30% w/in VG->PL.  Marland Kitchen GERD (gastroesophageal reflux disease)   . High cholesterol   . History of hiatal hernia   . Hypertension   . Ischemic cardiomyopathy    a. 11/2015 Echo: EF 30-35%, diff HK, Gr1 DD.  Marland Kitchen LBBB (left bundle branch block)   . Moderate aortic stenosis    a. 11/2015 Echo: EF 30-35%, diff HK, Gr1 DD, mod AS, mild AI, mildly dil Ao root and Asc Ao, mild MR, mod dil LA, PASP .  . Osteoporosis   . PAD (peripheral artery disease) (HCC)    a. s/p prior rotational atherectomy of L pop and tib trunk and PTA of focal popliteal lesions; b. 03/2015 ABI: R - 0.88, L 0.96, LCFA 50-79%, RSFA 50-79% distal stenosis, 3 vessel runoff.  . Palpitations    a. 06/2012 Event monitor: freq ventricular ectopy w/o VT.  Marland Kitchen PVC (premature ventricular contraction) 04/26/1997   on beta  blocker/pt had cardio net monitor 06/23/2012  . RLS (restless legs syndrome) 10/01/2012  . Type II diabetes mellitus (HCC)   . Unspecified hereditary and idiopathic peripheral neuropathy 10/01/2012    Past Surgical History:  Procedure Laterality Date  . APPENDECTOMY    . CARDIAC CATHETERIZATION  04/26/1997   CAD - 50-60% prox to mid LAD with 70% stenosis in mid-distal, 70-80% 1st diag stenosis, 50-60% ostial stenosis in large 2nd diagonal; diffuse RCA stenosis with 40-50% osital narrowing with 70% prox stenosis (Dr. Bishop Limbo) >> CABG  . CARDIAC CATHETERIZATION  11/01/2014  . CARDIAC CATHETERIZATION N/A 04/05/2016   Procedure: Right/Left Heart Cath and Coronary/Graft Angiography;  Surgeon: Lennette Bihari, MD;  Location:  [facility] INVASIVE CV LAB;  Service: Cardiovascular;  Laterality: N/A;  . CATARACT EXTRACTION W/ INTRAOCULAR LENS  IMPLANT, BILATERAL Bilateral   . CORONARY ARTERY BYPASS GRAFT  04/1997   CABGx5 - LIMA to LAD, SVG to PDA, SVG to PLA, VG to OM1, VG to 2nd diagonal (Dr. Dorris Fetch)  . DOPPLER ECHOCARDIOGRAPHY  05/18/2012   EF 45-50%, LV systolic function mildly reduced; borderline LA enlargment; mild mitral annular calcif, mild-mod MR; mild TR with normal RVSP; mild calcification of AV leaflets and mild-mod valvular AS with mild regurg      . DOPPLER ECHOCARDIOGRAPHY  08/19/2011   EF 50-55%, mod conc LVH; mild mitral annular calcif with mild MR; mild-mod TR; AV mildly sclerotic with mild valvular AS and mild regurg, mild aortic root dilatation  . HERNIA REPAIR    . HIATAL HERNIA REPAIR    . INGUINAL HERNIA REPAIR Bilateral   . LEFT AND RIGHT HEART CATHETERIZATION WITH CORONARY ANGIOGRAM N/A 11/01/2014   Procedure: LEFT AND RIGHT HEART CATHETERIZATION WITH CORONARY ANGIOGRAM;  Surgeon: Lennette Bihari, MD; EF35-40%, mild AS w/ 2+ AI, LAD, CFX, RCA 100%, LIMA-LAD OK, SVG-D1 OK, SVG-PDA 30%, SVG-OM 95%  . LOWER EXTREMITY ANGIOGRAM  02/05/2011   Diamondback orbital rotational atherectomy, percutaneous transluminal angioplasty of high-grade calcified popliteal & tibioperoneal stenosis (Dr. Erlene Quan)  . LOWER EXTREMITY ARTERIAL DOPPLER  02/2012   RLE - mild to mod arterial insuff; RSFA 50-69% diameter reduction; R pop 50-69% diameter reduction; posterior tibial (R) demonstrates occlusive disease; LSFA/prox pop 0-49% diameter reduction; L distal pop 50-69% diameter reduction; posterior tibial (L) demonstrates occlusive disease  . NM MYOCAR PERF EJECTION FRACTION  09/2011   lexiscan - no reversible ischmai, fixed anteroseptal defect r/t LBBB; EF 51%; septal hypokinesis; low risk but abnormal  . PERCUTANEOUS CORONARY STENT INTERVENTION (PCI-S) N/A 11/02/2014   Procedure: PERCUTANEOUS CORONARY STENT INTERVENTION (PCI-S);   Surgeon: Lennette Bihari, MD; 3.015 mm Resolute integrity DES to the SVG-OM   . TONSILLECTOMY      Family History  Problem Relation Age of Onset  . CAD Mother   . Heart attack Father     Social History   Social History  . Marital status: Married    Spouse name: Maralyn Sago  . Number of children: 1  . Years of education: College   Occupational History  .  Retired   Social History Main Topics  . Smoking status: Never Smoker  . Smokeless tobacco: Never Used  . Alcohol use No  . Drug use: No  . Sexual activity: No   Other Topics Concern  . Not on file   Social History Narrative   Pt lives at home with his spouse.   Has been married 81yrs.   Caffeine HKV:[id number], is right handed    Current  Outpatient Prescriptions  Medication Sig Dispense Refill  . amLODipine (NORVASC) 10 MG tablet Take 0.5 tablets (5 mg total) by mouth daily. KEEP OV. 90 tablet 0  . aspirin 81 MG tablet Take 81 mg by mouth every evening.     Marland Kitchen. atorvastatin (LIPITOR) 40 MG tablet TAKE 1 TABLET DAILY AT 6 P.M. (KEEP OFFICE VISIT) 90 tablet 2  . cetirizine (ZYRTEC) 10 MG tablet Take 10 mg by mouth daily.    . clopidogrel (PLAVIX) 75 MG tablet TAKE 1 TABLET DAILY (KEEP OFFICE VISIT) 90 tablet 2  . gabapentin (NEURONTIN) 300 MG capsule Take 1 capsule (300 mg total) by mouth at bedtime. 90 capsule 3  . hydrALAZINE (APRESOLINE) 10 MG tablet Take 2 tablets (20 mg total) by mouth every 8 (eight) hours. 270 tablet 1  . metformin (FORTAMET) 1000 MG (OSM) 24 hr tablet Take 1,000 mg by mouth 2 (two) times daily with a meal.    . metoprolol (LOPRESSOR) 100 MG tablet Take 1 tablet (100 mg total) by mouth 2 (two) times daily. 180 tablet 1  . Multiple Vitamins-Minerals (PRESERVISION AREDS 2 PO) Take 1 capsule by mouth 2 (two) times daily.     . multivitamin-iron-minerals-folic acid (CENTRUM) chewable tablet Chew 1 tablet by mouth daily.    . niacin (NIASPAN) 750 MG CR tablet Take 2 tablets (1,500 mg total) by mouth at bedtime.  180 tablet 1  . nitroGLYCERIN (NITROSTAT) 0.4 MG SL tablet Place 1 tablet (0.4 mg total) under the tongue every 5 (five) minutes as needed for chest pain. 25 tablet 3  . oxymetazoline (AFRIN) 0.05 % nasal spray Place 2 sprays into both nostrils 3 (three) times daily as needed for congestion (Allergies).    . polycarbophil (FIBERCON) 625 MG tablet Take 1,250 mg by mouth daily.     Marland Kitchen. torsemide (DEMADEX) 20 MG tablet Take 2 tablets in the the morning and 1 tablet. 180 tablet 1  . zoledronic acid (RECLAST) 5 MG/100ML SOLN Inject 100 mLs (5 mg total) into the vein once. 100 mL 0   No current facility-administered medications for this visit.     Allergies  Allergen Reactions  . Other     Patient is allergic to something but doesn't know the name.  . Pramipexole Swelling      Review of Systems:   General:  normal appetite, decreased energy, no weight gain, no weight loss, no fever  Cardiac:  has chest pain with exertion, no chest pain at rest, moderate SOB with mild exertion, occasional resting SOB, no PND, no orthopnea, has palpitations, no arrhythmia, no atrial fibrillation, some LE edema, some dizzy spells, no syncope  Respiratory:  exertional shortness of breath, no home oxygen, no productive cough, no dry cough, no bronchitis, no wheezing, no hemoptysis, no asthma, no pain with inspiration or cough, no sleep apnea, no CPAP at night  GI:   no difficulty swallowing, no reflux, no frequent heartburn, no hiatal hernia, no abdominal pain, no constipation, no diarrhea, no hematochezia, no hematemesis, no melena  GU:   no dysuria,  no frequency, no urinary tract infection, no hematuria, no enlarged prostate, no kidney stones, no kidney disease  Vascular:  no pain suggestive of claudication, no pain in feet, no leg cramps, no varicose veins, no DVT, no non-healing foot ulcer  Neuro:   no stroke, no TIA's, no seizures, no headaches, no temporary blindness one eye,  no slurred speech, no peripheral  neuropathy, no chronic pain, no instability of gait, no  memory/cognitive dysfunction  Musculoskeletal: has arthritis, no joint swelling, no myalgias, no difficulty walking, no mobility   Skin:   no rash, no itching, no skin infections, no pressure sores or ulcerations  Psych:   no anxiety, no depression, no nervousness, no unusual recent stress  Eyes:   no blurry vision, no floaters, no recent vision changes,  wears glasses or contacts  ENT:   has hearing loss, no loose or painful teeth, no dentures, last saw dentist Jan 2017  Hematologic:  no easy bruising, no abnormal bleeding, no clotting disorder, no frequent epistaxis  Endocrine:  has diabetes, does check CBG's at home          Physical Exam:   BP (!) 143/66   Pulse 62   Resp 16   Ht 5\' 6"  (1.676 m)   Wt 173 lb (78.5 kg)   SpO2 96% Comment: RA  BMI 27.92 kg/m   General:  Elderly,  but  well-appearing and looks his age  HEENT:  Unremarkable , NCAT, PERLA, EOMI, oropharynx clear  Neck:   no JVD, no bruits, no adenopathy or thyromegaly  Chest:   clear to auscultation, symmetrical breath sounds, no wheezes, no rhonchi   CV:   RRR, grade III/VI crescendo/decrescendo murmur heard best at RSB,  no diastolic murmur  Abdomen:  soft, non-tender, no masses or organomegaly  Extremities:  warm, well-perfused, pulses not palpable, no LE edema  Rectal/GU  Deferred  Neuro:   Grossly non-focal and symmetrical throughout  Skin:   Clean and dry, no rashes, no breakdown   Diagnostic Tests:   [facility]                        [address]                        [place], [place] [address]                            [phone]  ------------------------------------------------------------------- Transthoracic Echocardiography  Patient:    Iziah, Cates MR #:       [id number] Study Date: 11/21/2015 Gender:     M Age:        87 Height:     167.6 cm Weight:     79.4 kg BSA:        1.94 m^2 Pt. Status: Room:    ATTENDING    Olga Millers  ORDERING     Barrett, Joline Salt  REFERRING    Barrett, Joline Salt  SONOGRAPHER  Dewitt Hoes, RDCS  PERFORMING   [facility]  cc:  ------------------------------------------------------------------- LV EF: 30% -   35%  ------------------------------------------------------------------- Indications:      Aortic stenosis (I35.0).  ------------------------------------------------------------------- History:   PMH:   Dyspnea.  Coronary artery disease.  Aortic valve disease.  Risk factors:  LBBB. Palpitation. Tachycardia. Hypertension. Diabetes mellitus. Dyslipidemia.  ------------------------------------------------------------------- Study Conclusions  - Left ventricle: The cavity size was normal. Wall thickness was   increased in a pattern of moderate LVH. Systolic function was   moderately to severely reduced. The estimated ejection fraction   was in the range of 30% to 35%. Diffuse hypokinesis. Doppler   parameters are consistent with abnormal left ventricular   relaxation (grade 1 diastolic dysfunction). Doppler parameters   are consistent with high ventricular filling pressure. - Aortic valve: Valve mobility was restricted. There was  moderate   stenosis. There was mild regurgitation. - Aortic root: The aortic root was mildly dilated. - Ascending aorta: The ascending aorta was mildly dilated. - Mitral valve: There was mild regurgitation. - Left atrium: The atrium was moderately dilated. - Right ventricle: The cavity size was mildly dilated. - Right atrium: The atrium was mildly dilated. - Pulmonary arteries: Systolic pressure was moderately increased.   PA peak pressure: 46 mm Hg (S).  Impressions:  - Moderate to severe global reduction in LV function; moderate LVH;   grade 1 diastolic dysfunction with elevated LV filling pressure;   moderate LAE; calcified aortic valve with moderate AS (mean   gradient of 22 mmHg; AVA 1.1 cm2) and  mild AI; mild MR; moderate   LAE; mild RAE and RVE; mild TR with moderately elevated pulmonary   pressure.  ------------------------------------------------------------------- Labs, prior tests, procedures, and surgery: Transthoracic echocardiography (05/10/2014).    The aortic valve showed mild stenosis. The aortic valve showed mild regurgitation. EF was 40%. Aortic valve: peak gradient of 32 mm Hg and mean gradient of 15 mm Hg.  Transthoracic echocardiography.  M-mode, complete 2D, spectral Doppler, and color Doppler.  Birthdate:  Patient birthdate: Apr 07, 1928.  Age:  Patient is 82 yr old.  Sex:  Gender: male. BMI: 28.3 kg/m^2.  Blood pressure:     132/70  Patient status: Outpatient.  Study date:  Study date: 11/21/2015. Study time: 12:46 PM.  Location:  [facility]  -------------------------------------------------------------------  ------------------------------------------------------------------- Left ventricle:  The cavity size was normal. Wall thickness was increased in a pattern of moderate LVH. Systolic function was moderately to severely reduced. The estimated ejection fraction was in the range of 30% to 35%. Diffuse hypokinesis. Doppler parameters are consistent with abnormal left ventricular relaxation (grade 1 diastolic dysfunction). Doppler parameters are consistent with high ventricular filling pressure.  ------------------------------------------------------------------- Aortic valve:   Trileaflet; moderately calcified leaflets. Valve mobility was restricted.  Doppler:   There was moderate stenosis. There was mild regurgitation.    VTI ratio of LVOT to aortic valve: 0.27. Valve area (VTI): 1.12 cm^2. Indexed valve area (VTI): 0.58 cm^2/m^2. Peak velocity ratio of LVOT to aortic valve: 0.27. Valve area (Vmax): 1.11 cm^2. Indexed valve area (Vmax): 0.57 cm^2/m^2. Mean velocity ratio of LVOT to aortic valve: 0.26. Valve area (Vmean): 1.07 cm^2. Indexed  valve area (Vmean): 0.55 cm^2/m^2. Mean gradient (S): 22 mm Hg. Peak gradient (S): 40 mm Hg.  ------------------------------------------------------------------- Aorta:  Aortic root: The aortic root was mildly dilated. Ascending aorta: The ascending aorta was mildly dilated.  ------------------------------------------------------------------- Mitral valve:   Structurally normal valve.   Mobility was not restricted.  Doppler:  Transvalvular velocity was within the normal range. There was no evidence for stenosis. There was mild regurgitation.    Peak gradient (D): 3 mm Hg.  ------------------------------------------------------------------- Left atrium:  The atrium was moderately dilated.  ------------------------------------------------------------------- Right ventricle:  The cavity size was mildly dilated. Systolic function was normal.  ------------------------------------------------------------------- Pulmonic valve:    Doppler:  Transvalvular velocity was within the normal range. There was no evidence for stenosis.  ------------------------------------------------------------------- Tricuspid valve:   Structurally normal valve.    Doppler: Transvalvular velocity was within the normal range. There was mild regurgitation.  ------------------------------------------------------------------- Pulmonary artery:   Systolic pressure was moderately increased.  ------------------------------------------------------------------- Right atrium:  The atrium was mildly dilated.  ------------------------------------------------------------------- Pericardium:  There was no pericardial effusion.  ------------------------------------------------------------------- Systemic veins: Inferior vena cava: The vessel was normal in size.  ------------------------------------------------------------------- Measurements  Left ventricle                            Value           Reference  LV ID, ED, PLAX chordal           (L)     39.1  mm       43 - 52  LV ID, ES, PLAX chordal                   33.6  mm       23 - 38  LV fx shortening, PLAX chordal    (L)     14    %        >=29  LV PW thickness, ED                       12.9  mm       ---------  IVS/LV PW ratio, ED                       1.11           <=1.3  Stroke volume, 2D                         97    ml       ---------  Stroke volume/bsa, 2D                     50    ml/m^2   ---------  LV e&', lateral                            5.57  cm/s     ---------  LV E/e&', lateral                          14.97          ---------  LV e&', medial                             2.68  cm/s     ---------  LV E/e&', medial                           31.12          ---------  LV e&', average                            4.13  cm/s     ---------  LV E/e&', average                          20.22          ---------    Ventricular septum                        Value          Reference  IVS thickness, ED                         14.3  mm       ---------  LVOT                                      Value          Reference  LVOT ID, S                                23    mm       ---------  LVOT area                                 4.15  cm^2     ---------  LVOT peak velocity, S                     84.3  cm/s     ---------  LVOT mean velocity, S                     56.4  cm/s     ---------  LVOT VTI, S                               23.4  cm       ---------    Aortic valve                              Value          Reference  Aortic valve peak velocity, S             315   cm/s     ---------  Aortic valve mean velocity, S             218   cm/s     ---------  Aortic valve VTI, S                       86.4  cm       ---------  Aortic mean gradient, S                   22    mm Hg    ---------  Aortic peak gradient, S                   40    mm Hg    ---------  VTI ratio, LVOT/AV                        0.27           ---------   Aortic valve area, VTI                    1.12  cm^2     ---------  Aortic valve area/bsa, VTI                0.58  cm^2/m^2 ---------  Velocity ratio, peak, LVOT/AV             0.27           ---------  Aortic valve area, peak velocity          1.11  cm^2     ---------  Aortic valve area/bsa, peak               0.57  cm^2/m^2 ---------  velocity  Velocity ratio, mean, LVOT/AV             0.26           ---------  Aortic valve area, mean velocity          1.07  cm^2     ---------  Aortic valve area/bsa, mean               0.55  cm^2/m^2 ---------  velocity  Aortic regurg pressure half-time          581   ms       ---------    Aorta                                     Value          Reference  Aortic root ID, ED                        40    mm       ---------  Ascending aorta ID, A-P, S                39    mm       ---------    Left atrium                               Value          Reference  LA ID, A-P, ES                            42    mm       ---------  LA ID/bsa, A-P                            2.16  cm/m^2   <=2.2  LA volume, S                              81.4  ml       ---------  LA volume/bsa, S                          41.9  ml/m^2   ---------  LA volume, ES, 1-p A4C                    54.7  ml       ---------  LA volume/bsa, ES, 1-p A4C                28.2  ml/m^2   ---------  LA volume, ES, 1-p A2C                    104   ml       ---------  LA volume/bsa, ES, 1-p A2C                53.5  ml/m^2   ---------    Mitral valve  Value          Reference  Mitral E-wave peak velocity               83.4  cm/s     ---------  Mitral A-wave peak velocity               115   cm/s     ---------  Mitral deceleration time          (H)     380   ms       150 - 230  Mitral peak gradient, D                   3     mm Hg    ---------  Mitral E/A ratio, peak                    0.7            ---------    Pulmonary arteries                        Value           Reference  PA pressure, S, DP                (H)     46    mm Hg    <=30    Tricuspid valve                           Value          Reference  Tricuspid regurg peak velocity            309   cm/s     ---------  Tricuspid peak RV-RA gradient             38    mm Hg    ---------    Systemic veins                            Value          Reference  Estimated CVP                             8     mm Hg    ---------    Right ventricle                           Value          Reference  RV pressure, S, DP                (H)     46    mm Hg    <=30  RV s&', lateral, S                         7.08  cm/s     ---------  Legend: (L)  and  (H)  mark values outside specified reference range.  ------------------------------------------------------------------- Prepared and Electronically Authenticated by  Olga Millers 2017-05-16T16:13:27                           [facility]  [address]                        [place], [place] [address]                            [phone]  ------------------------------------------------------------------- Stress Echocardiography  Patient:    Orry, Sigl MR #:       [id number] Study Date: 03/20/2016 Gender:     M Age:        74 Height:     167.6 cm Weight:     78.6 kg BSA:        1.93 m^2 Pt. Status: Room:   ATTENDING    Charlton Haws, M.D.  REFERRING    [facility] [id number]  SONOGRAPHER  Randa Evens, Will  ORDERING     Nicolasa Ducking, MD  REFERRING    Nicolasa Ducking, MD  PERFORMING   [facility]  cc:  -------------------------------------------------------------------  ------------------------------------------------------------------- Indications:      (I35.0).  ------------------------------------------------------------------- History:   PMH:  Aortic stenosis.  ------------------------------------------------------------------- Study Conclusions  - Impressions:  Stage: Peak Vel Mean Gradient Peak Gradient AVA     Baseline 3.4 m/sec 25 mmHg 46 mmHg 1.02 cm2   Low Dose 3.56 m/sec 28 mmHg 51 mmHg .92 cm2   Mid Dose 3.74 m/sec 31 mmHg 56 mmHg 1.08 cm2   High Dose 3.86 m/sec 31 mmHg 60 mmHg 1.04 cm2     SV increased by 22% with Dobutamine infusion indictating some   contractile reserve   Overall findings consistent wtih moderate to severe fixed AS  Impressions:  - Stage: Peak Vel Mean Gradient Peak Gradient AVA     Baseline 3.4 m/sec 25 mmHg 46 mmHg 1.02 cm2   Low Dose 3.56 m/sec 28 mmHg 51 mmHg .92 cm2   Mid Dose 3.74 m/sec 31 mmHg 56 mmHg 1.08 cm2   High Dose 3.86 m/sec 31 mmHg 60 mmHg 1.04 cm2     SV increased by 22% with Dobutamine infusion indictating some   contractile reserve   Overall findings consistent wtih moderate to severe fixed AS  ------------------------------------------------------------------- Study data:   Study status:  Routine.  Consent:  The risks, benefits, and alternatives to the procedure were explained to the patient and informed consent was obtained.  Procedure:  The patient reported no pain pre or post test. Initial setup. The patient was brought to the laboratory. A baseline ECG was recorded. Surface ECG leads and automatic cuff blood pressure measurements were monitored.  Dobutamine stress test. Stress testing was performed, with dobutamine infusion from 5 to 20 mcg/kg/min. The infusion was terminated due to maximal dose administration. Transthoracic stress echocardiography for assessment of valvular function. Images were captured at baseline, low dose, peak dose, and recovery.  Study completion:  The patient tolerated the procedure well. There were no complications.          Dobutamine. Stress echocardiography. 2D.  Birthdate:  Patient birthdate: [birth year].  Age:  Patient is 82 yr old.  Sex:  Gender: male.    BMI: 28 kg/m^2.  Blood pressure:     142/64  Patient status:  Outpatient.  Study date:  Study  date: 03/20/2016. Study time: 03:13 PM.  -------------------------------------------------------------------  ------------------------------------------------------------------- Aortic valve:   Doppler:     VTI ratio of LVOT to aortic valve: 0.22. Valve area (VTI): 0.99 cm^2. Indexed valve area (VTI): 0.51 cm^2/m^2. Peak velocity ratio of LVOT to  aortic valve: 0.23. Valve area (Vmax): 1.04 cm^2. Indexed valve area (Vmax): 0.54 cm^2/m^2.  Mean gradient (S): 28 mm Hg. Peak gradient (S): 51 mm Hg.   ------------------------------------------------------------------- Stress protocol:  +----------------------+--+----------+-------------------+--------+ Stage                 HRBP (mmHg) Rhythm             Symptoms +----------------------+--+----------+-------------------+--------+ Baseline              54142/64    Occasional PVC&'s   None                             (90)                                  +----------------------+--+----------+-------------------+--------+ Dobutamine 5 ug/kg/min51124/52    ---------------------------                         (76)                                  +----------------------+--+----------+-------------------+--------+ Dobutamine 10         52132/44    --------------------------- ug/kg/min               (73)                                  +----------------------+--+----------+-------------------+--------+ Dobutamine 20         56136/40    Ventricular        -------- ug/kg/min               (72)      couplets                    +----------------------+--+----------+-------------------+--------+ Immediate post stress 57134/42    Occasional PVC&'s   --------                         (73)                                  +----------------------+--+----------+-------------------+--------+ Recovery; 1 min        58------------------------------------- +----------------------+--+----------+-------------------+--------+ Recovery; 2 min       58----------Ventricular        --------                                   couplets                    +----------------------+--+----------+-------------------+--------+ Recovery; 3 min       60142/48    Ventricular        --------                         (79)      couplets                    +----------------------+--+----------+-------------------+--------+ Recovery; 4 min       60------------------------------------- +----------------------+--+----------+-------------------+--------+ Recovery; 5 min       55----------Occasional  PVC&'s   -------- +----------------------+--+----------+-------------------+--------+ Recovery; 6 min       54144/60    Occasional PVC&'s   --------                         (88)                                  +----------------------+--+----------+-------------------+--------+ Recovery; 7 min       54----------Occasional PVC&'s   -------- +----------------------+--+----------+-------------------+--------+  ------------------------------------------------------------------- Stress results:   Maximal heart rate during stress was 60 bpm (45% of maximal predicted heart rate). The maximal predicted heart rate was 134 bpm.The target heart rate was achieved. The heart rate response to stress was blunted. There was a normal resting blood pressure. Abnormal blood pressure response to dobutamine. The rate-pressure product for the peak heart rate and blood pressure was 8520 mm Hg/min.  The patient experienced no chest pain during stress.  ------------------------------------------------------------------- Stress ECG:  SR nonspecific ST changes Non diagnostic  ------------------------------------------------------------------- Measurements   Left ventricle                            Value  Stroke volume, 2D                        93    ml  Stroke volume/bsa, 2D                    48    ml/m^2    LVOT                                     Value  LVOT ID, S                               24    mm  LVOT area                                4.52  cm^2  LVOT peak velocity, S                    82.7  cm/s  LVOT VTI, S                              20.5  cm  LVOT peak gradient, S                    3     mm Hg    Aortic valve                             Value  Aortic valve peak velocity, S            358   cm/s  Aortic valve mean velocity, S            249   cm/s  Aortic valve VTI, S                      93.7  cm  Aortic mean  gradient, S                  28    mm Hg  Aortic peak gradient, S                  51    mm Hg  VTI ratio, LVOT/AV                       0.22  Aortic valve area, VTI                   0.99  cm^2  Aortic valve area/bsa, VTI               0.51  cm^2/m^2  Velocity ratio, peak, LVOT/AV            0.23  Aortic valve area, peak velocity         1.04  cm^2  Aortic valve area/bsa, peak velocity     0.54  cm^2/m^2  Legend: (L)  and  (H)  mark values outside specified reference range.  ------------------------------------------------------------------- Prepared and Electronically Authenticated by  Charlton Haws, M.D. 2017-09-13T16:21:46  Physicians   Panel Physicians Referring Physician Case Authorizing Physician  Lennette Bihari, MD (Primary)    Procedures   Right/Left Heart Cath and Coronary/Graft Angiography  Conclusion     Ost 2nd Diag to 2nd Diag lesion, 90 %stenosed.  Mid LAD to Dist LAD lesion, 100 %stenosed.  Ost Cx lesion, 100 %stenosed.  Ost RCA lesion, 100 %stenosed.  SVG.  SVG.  SVG.  Origin to Prox Graft lesion, 20 %stenosed.  LIMA.  There is moderate (3+) aortic regurgitation.   Severe multivessel native CAD with 90% stenosis in the second diagonal branch of the LAD and total occlusion of the LAD after the  proximal septal perforating artery; total occlusion of the left circumflex vessel ostially and total occlusion of the RCA ostium.  Patent LIMA graft which supplies the mid LAD.  There is a smooth 10% ostial narrowing.  The subclavian vessel takes off much more rightward from the flattened aortic arch.  Patent SVG which supplies the circumflex marginal vessel.  A stent is in the proximal portion of the graft which is patent and has smooth inferior intimal hyperplasia of less than 20%.  Patent SVG which supplies the second diagonal vessel of the LAD.  Patent SVG which supplies the mid PDA.  A sequential limb was not visualized, but the distal RCA gave rise to 3 additional branches beyond the PDA takeoff.  Supravalvular aortography mentioning a dilated aortic root with markedly reduced aortic valve excursion, and at least 2+ angiographic aortic insufficiency.  Significant aortic valve stenosis which has been documented on dobutamine echo as low gradient severe  AS.  Mild pulmonary hypertension.  RECOMMENDATION: The present catheterization continues to demonstrate patency to the vein graft conduits as well as the left internal mammary artery with severe native CAD.  The patient has experienced a definite decline in functional capacity and on recent echo Doppler study EF has declined to 35%.  I have recommended that he follow-up with Dr. Sanjuana Kava to proceed with TAVR and additional imaging requirements prior to final recommendations.   Indications   Coronary artery disease involving coronary bypass graft of native heart with other forms of angina pectoris (HCC) [I25.708 (ICD-10-CM)]  Aortic stenosis [I35.0 (ICD-10-CM)]  Cardiomyopathy (HCC) [I42.9 (ICD-10-CM)]  Procedural Details/Technique   Technical Details Mr. Matteo Banke is an 82 year old Caucasian  male who has a history of HTN, HLD, DM, CAD s/p CABG and prior PCI, ischemic cardiomyopathy, diabetes, PAD with prior lower  extremity interventions, LBBB, PVCS and progressive AV stenosis. He underwent PTCA of his LAD in 1991 and in 1998 underwent 5V CABG. In March of 2016 he developed progressive fatigue and dyspnea and repeat cardiac cath showed patent LIMA to LAD, patent SVG to diagonal, patent sequential SVG to PDA and PLA with severe disease in the SVG to OM. A Resolute DES stent was placed in the SVG to the obtuse marginal. He has developed progressive aortic valve stenosis. His echo in May 2017 showed LVEF=30-35% with moderate LVH, diffuse hypokinesis of the LV with moderate aortic stenosis with mean gradient of 22 mmHg and AVA 1.1cm2. There was concern for low output aortic stenosis. A dobutamine stress echo on 03/20/16 demonstrated elevation of velocity across the aortic valve with infusion of dobutamine (3.86 m/sec with mean gradient 31 mm Hg, peak gradient 60 mmHg, AVA 1.0cm2). He was evaluated by Dr. Sanjuana Kava for consideration for TAVR. He presents for right and left heart catheterization prior to final recommendations.  The patient presented to the catheterization laboratory in the fasting state. His right femoral artery and right femoral vein were punctured anteriorly and a arterial sheath and venous sheaths were inserted. A Swan-Ganz catheter was inserted into the venous sheath and was advanced to the RA and RV position. However, this catheter even with wire assistance was unable to enter the RVOT, and PA and ultimately was removed. Diagnostic catheterization was then performed. The the arterial sheath and 5 French Judkins 5 affect and JR4 right catheters were used for angiography into the native coronary arteries as well as into all 3 vein grafts. A LIMA catheter was necessary for selective angiography into the left internal mammary artery. Of note, due to the elongated aortic arch, the subclavian takeoff was much more rightward than normally. An S shaped Swan catheter was then inserted and with a Glidewire support.  Ultimately, the catheter was able to be passed into the pulmonary artery and wedge positions. Oxygen saturation was obtained in the pulmonary artery and aorta. Cardiac output determinations were done both by the thermodilution and Fick methods. A pigtail catheter was then inserted and advanced to the central aorta. Central AO and PA pressures were recorded. Supravalvular aortography was performed. An attempt was made to cross the aortic valve, although the wire was crossed. The pigtail did not cross the system. Pullback. The pigtail catheter was then removed and a right catheter was inserted and with a long straight wire the aortic valve was crossed and the RC catheter was placed into the ventricle. Since the patient had a recent echo Doppler study showing reduced LV function. Left ventriculography was not performed, but I LV to AO pullback was done. During the pullback, the patient had his typical PVCs which made the pressure determinations difficult to be calculated by the software. All catheters were removed from the patient. The patient tolerated the procedure well and returned to his room in stable condition with plans for discharge later today.   Estimated blood loss <50 mL.  During this procedure the patient was administered the following to achieve and maintain moderate conscious sedation: Versed 1 mg, Fentanyl 25 mcg, while the patient's heart rate, blood pressure, and oxygen saturation were continuously monitored. The period of conscious sedation was 75 minutes, of which I was present face-to-face 100% of this time.    Coronary Findings   Dominance:  Right  Left Main  The left main was long vessel which essentially gave rise to the LAD and 2 proximal diagonal vessels. The circumflex was occluded at its origin.  Left Anterior Descending  The LAD gave rise to 2 proximal diagonal vessels. The second diagonal vessel had proximal 90% stenosis. He septal perforating artery arose after the diagonal  vessel and the LAD was occluded beyond the septal perforating artery.  Mid LAD to Dist LAD lesion, 100% stenosed.  Second Diagonal [employer] 2nd Diag to 2nd Diag lesion, 90% stenosed.  Left Circumflex  Left circumflex vessel was occluded at its origen.  Ost Cx lesion, 100% stenosed.  Right Coronary Artery  RCA was occluded at its origin.  Ost RCA lesion, 100% stenosed.  Graft Angiography  saphenous Graft to RPDA  SVG.  saphenous Graft to 2nd Diag  SVG.  saphenous Graft to 1st Mrg  SVG.  Origin to Prox Graft lesion, 20% stenosed. The lesion was previously treated.  Free LIMA Graft to Dist LAD  LIMA.  Right Heart   Right Heart Pressures RA: a 2; V 5; mean 3 RV: 37/5 PA: 37/10 PW: a 12 ; v 13; mean 9  AO: 139/44 PA: 30/12  LV 165/10 PW: a 13; v 22; mean 11  The highest LV pressure recorded was 176/9. On pullback, there was ectopy and several consecutive beats could not be accurately calibrated which may contribute to erroneous data.  LV:163/18 AO: 152/40  Cardiac output by the Fick method was 6.6, and by the thermodilution was 5.9 L/m Cardiac index by the Fick method was 3.5 and by the thermodilution method was 3.1 L/m/m. The pullback pressures may not be completely accurate in the software calculated mean pressure was 17.6 with a 1.3 cm AVA    Left Heart   Left Ventricle LVEDP 10 mm Hg    Aorta Aortic Root: The aortic root displays dilation. There is moderate (3+) aortic regurgitation.   Aortic Arch: Supravalvular aortography demonstrated a dilated aortic root with significantly reduced aortic valve excursion at least moderate aortic insufficiency.    Coronary Diagrams   Diagnostic Diagram     Implants     No implant documentation for this case.  PACS Images   Show images for Cardiac catheterization   Link to Procedure Log   Procedure Log    Hemo Data   Flowsheet Row Most Recent Value  Fick Cardiac Output 6.56 L/min  Fick Cardiac Output Index  3.47 (L/min)/BSA  Thermal Cardiac Output 5.86 L/min  Thermal Cardiac Output Index 3.1 (L/min)/BSA  Aortic Mean Gradient 17.6 mmHg  Aortic Peak Gradient 13 mmHg  Aortic Valve Area 1.31  Aortic Value Area Index 0.69 cm2/BSA  RA A Wave 2 mmHg  RA V Wave 5 mmHg  RA Mean 3 mmHg  RV Systolic Pressure 40 mmHg  RV Diastolic Pressure 0 mmHg  RV EDP 6 mmHg  PA Systolic Pressure 30 mmHg  PA Diastolic Pressure 12 mmHg  PA Mean 20 mmHg  PW A Wave 13 mmHg  PW V Wave 22 mmHg  PW Mean 11 mmHg  AO Systolic Pressure 139 mmHg  AO Diastolic Pressure 44 mmHg  AO Mean 72 mmHg  LV Systolic Pressure 176 mmHg  LV Diastolic Pressure 3 mmHg  LV EDP 9 mmHg  Arterial Occlusion Pressure Extended Systolic Pressure 152 mmHg  Arterial Occlusion Pressure Extended Diastolic Pressure 40 mmHg  Arterial Occlusion Pressure Extended Mean Pressure 69 mmHg  Left Ventricular Apex Extended Systolic Pressure  163 mmHg  Left Ventricular Apex Extended Diastolic Pressure 2 mmHg  Left Ventricular Apex Extended EDP Pressure 18 mmHg  QP/QS 1  TPVR Index 6.78 HRUI  TSVR Index 27.74 HRUI  PVR SVR Ratio 0.14  TPVR/TSVR Ratio 0.24   RISK SCORES About the STS Risk Calculator Procedure: AV Replacement  Risk of Mortality: 6.61%  Morbidity or Mortality: 28.343%  Long Length of Stay: 13.354%  Short Length of Stay: 16.166%  Permanent Stroke: 2.64%  Prolonged Ventilation: 17.143%  DSW Infection: 0.246%  Renal Failure: 8.314%  Reoperation: 10.739%   Impression:  This 81 year old gentleman has stage D severe, symptomatic aortic stenosis with NYHA class III symptoms of exertional fatigue and shortness of breath with some chest discomfort and dizziness with position changes. I have personally reviewed and interpreted his echo, dobutamine stress echo, and cath  He has a severely calcified trileaflet aortic valve with restricted mobility. His mean AV gradient on his dobutamine stress was 25 mm Hg, increasing to 31 mm Hg at high  dose. He had a significant contractile reserve. His peak velocity ratio was 0.23 and indexed valve area 0.54 cm2/m2, consistent with severe AS. He also has 3+ AI which is likely contributing to his symptoms. His cardiac cath shows patent grafts with no significant areas of coronary ischemia. His LVEF is moderately depressed at 30-35% and I think that the combination of depressed EF with severe AS and moderate AI is responsible for his symptoms. I think AVR is indicated in this patient. The patient and his wife were counseled at length regarding treatment alternatives for management of severe symptomatic aortic stenosis. Alternative approaches such as conventional aortic valve replacement, transcatheter aortic valve replacement, and palliative medical therapy were compared and contrasted at length. The risks associated with conventional surgical aortic valve replacement were been discussed in detail, as were expectations for post-operative convalescence. Long-term prognosis with medical therapy was discussed. I think he would be at high risk for open surgical AVR at his advanced age with prior CABG. TAVR is a much better option for him.   I discussed transcatheter aortic valve replacement, what types of management strategies would be attempted intraoperatively in the event of life-threatening complications, including whether or not the patient would be considered a candidate for the use of cardiopulmonary bypass and/or conversion to open sternotomy for attempted surgical intervention.  The patient has been advised of a variety of complications that might develop including but not limited to risks of death, stroke, paravalvular leak, aortic dissection or other major vascular complications, aortic annulus rupture, device embolization, cardiac rupture or perforation, mitral regurgitation, acute myocardial infarction, arrhythmia, heart block or bradycardia requiring permanent pacemaker placement, congestive heart  failure, respiratory failure, renal failure, pneumonia, infection, other late complications related to structural valve deterioration or migration, or other complications that might ultimately cause a temporary or permanent loss of functional independence or other long term morbidity. He would like to proceed with further workup for TAVR.  Plan:  He will be scheduled for a gated cardiac CT, CTA of the chest, abdomen and pelvis, PT evaluation and PFT's. He will then return to see Dr. Dorris Fetch or Dr. Cornelius Moras for a second surgical evaluation before deciding about proceeding with TAVR.  I spent 60 minutes performing this consultation and > 50% of this time was spent face to face counseling and coordinating the care of this patient's severe aortic stenosis.    Alleen Borne, MD 04/10/2016

## 2016-04-15 ENCOUNTER — Ambulatory Visit: Admission: RE | Admit: 2016-04-15 | Payer: Medicare Other | Source: Ambulatory Visit

## 2016-04-15 ENCOUNTER — Ambulatory Visit (INDEPENDENT_AMBULATORY_CARE_PROVIDER_SITE_OTHER)
Admission: RE | Admit: 2016-04-15 | Discharge: 2016-04-15 | Disposition: A | Discharge status: Home / Self Care | Payer: Medicare Other | Source: Ambulatory Visit | Attending: Cardiovascular Disease | Admitting: Cardiovascular Disease

## 2016-04-15 ENCOUNTER — Ambulatory Visit (HOSPITAL_COMMUNITY)
Admission: RE | Admit: 2016-04-15 | Discharge: 2016-04-15 | Disposition: A | Discharge status: Home / Self Care | Payer: Medicare Other | Source: Ambulatory Visit | Attending: Surgery | Admitting: Surgery

## 2016-04-15 ENCOUNTER — Other Ambulatory Visit: Payer: Self-pay | Admitting: *Deleted

## 2016-04-15 ENCOUNTER — Encounter: Payer: Self-pay | Admitting: Physical Therapy

## 2016-04-15 ENCOUNTER — Ambulatory Visit
Admission: RE | Admit: 2016-04-15 | Discharge: 2016-04-15 | Disposition: A | Discharge status: Home / Self Care | Payer: Medicare Other | Source: Ambulatory Visit | Attending: Cardiovascular Disease | Admitting: Cardiovascular Disease

## 2016-04-15 ENCOUNTER — Ambulatory Visit: Payer: Medicare Other | Attending: Surgery | Admitting: Physical Therapy

## 2016-04-15 DIAGNOSIS — I350 Nonrheumatic aortic (valve) stenosis: Secondary | ICD-10-CM

## 2016-04-15 DIAGNOSIS — Z7902 Long term (current) use of antithrombotics/antiplatelets: Secondary | ICD-10-CM | POA: Insufficient documentation

## 2016-04-15 DIAGNOSIS — Z79899 Other long term (current) drug therapy: Secondary | ICD-10-CM | POA: Insufficient documentation

## 2016-04-15 DIAGNOSIS — R293 Abnormal posture: Secondary | ICD-10-CM | POA: Diagnosis present

## 2016-04-15 DIAGNOSIS — Z7982 Long term (current) use of aspirin: Secondary | ICD-10-CM | POA: Diagnosis not present

## 2016-04-15 DIAGNOSIS — E119 Type 2 diabetes mellitus without complications: Secondary | ICD-10-CM | POA: Insufficient documentation

## 2016-04-15 DIAGNOSIS — K219 Gastro-esophageal reflux disease without esophagitis: Secondary | ICD-10-CM | POA: Insufficient documentation

## 2016-04-15 DIAGNOSIS — M6281 Muscle weakness (generalized): Secondary | ICD-10-CM | POA: Diagnosis present

## 2016-04-15 DIAGNOSIS — I6523 Occlusion and stenosis of bilateral carotid arteries: Secondary | ICD-10-CM | POA: Diagnosis not present

## 2016-04-15 DIAGNOSIS — E7800 Pure hypercholesterolemia, unspecified: Secondary | ICD-10-CM | POA: Insufficient documentation

## 2016-04-15 DIAGNOSIS — I2510 Atherosclerotic heart disease of native coronary artery without angina pectoris: Secondary | ICD-10-CM | POA: Diagnosis not present

## 2016-04-15 DIAGNOSIS — I10 Essential (primary) hypertension: Secondary | ICD-10-CM | POA: Diagnosis not present

## 2016-04-15 DIAGNOSIS — Z7984 Long term (current) use of oral hypoglycemic drugs: Secondary | ICD-10-CM | POA: Insufficient documentation

## 2016-04-15 DIAGNOSIS — R2689 Other abnormalities of gait and mobility: Secondary | ICD-10-CM | POA: Insufficient documentation

## 2016-04-15 DIAGNOSIS — G2581 Restless legs syndrome: Secondary | ICD-10-CM | POA: Diagnosis not present

## 2016-04-15 LAB — PULMONARY FUNCTION TEST
DL/VA % PRED: 93 %
DL/VA: 4.04 ml/min/mmHg/L
DLCO UNC % PRED: 50 %
DLCO UNC: 13.68 ml/min/mmHg
FEF 25-75 POST: 1.56 L/s
FEF 25-75 PRE: 1.95 L/s
FEF2575-%Change-Post: -20 %
FEF2575-%PRED-PRE: 150 %
FEF2575-%Pred-Post: 120 %
FEV1-%Change-Post: -6 %
FEV1-%PRED-POST: 73 %
FEV1-%Pred-Pre: 78 %
FEV1-Post: 1.55 L
FEV1-Pre: 1.65 L
FEV1FVC-%Change-Post: 5 %
FEV1FVC-%Pred-Pre: 119 %
FEV6-%CHANGE-POST: -11 %
FEV6-%PRED-POST: 61 %
FEV6-%PRED-PRE: 69 %
FEV6-PRE: 1.97 L
FEV6-Post: 1.75 L
FEV6FVC-%PRED-PRE: 108 %
FEV6FVC-%Pred-Post: 108 %
FVC-%Change-Post: -11 %
FVC-%Pred-Post: 56 %
FVC-%Pred-Pre: 63 %
FVC-Post: 1.75 L
FVC-Pre: 1.97 L
POST FEV1/FVC RATIO: 89 %
POST FEV6/FVC RATIO: 100 %
PRE FEV1/FVC RATIO: 84 %
Pre FEV6/FVC Ratio: 100 %
RV % pred: 88 %
RV: 2.26 L
TLC % PRED: 61 %
TLC: 3.86 L

## 2016-04-15 LAB — VAS US CAROTID
LCCADDIAS: 16 cm/s
LCCADSYS: 105 cm/s
LEFT ECA DIAS: -12 cm/s
LICADDIAS: -15 cm/s
LICADSYS: -71 cm/s
LICAPDIAS: 38 cm/s
LICAPSYS: 145 cm/s
Left CCA prox dias: 10 cm/s
Left CCA prox sys: 76 cm/s
RCCAPSYS: -78 cm/s
RIGHT ECA DIAS: -7 cm/s
RIGHT VERTEBRAL DIAS: 14 cm/s
Right CCA prox dias: -7 cm/s
Right cca dist sys: -65 cm/s

## 2016-04-15 MED ORDER — ALBUTEROL SULFATE (2.5 MG/3ML) 0.083% IN NEBU
2.5000 mg | INHALATION_SOLUTION | Freq: Once | RESPIRATORY_TRACT | Status: AC
  Administered 2016-04-15: 2.5 mg via RESPIRATORY_TRACT

## 2016-04-15 MED ORDER — IOPAMIDOL (ISOVUE-370) INJECTION 76%
80.0000 mL | Freq: Once | INTRAVENOUS | Status: AC | PRN
  Administered 2016-04-15: 150 mL via INTRAVENOUS

## 2016-04-15 NOTE — Progress Notes (Signed)
*  PRELIMINARY RESULTS* Vascular Ultrasound Carotid Duplex (Doppler) has been completed.  Preliminary findings: Right 1-39% ICA stenosis. Right vertebral artery demonstrates antegrade flow. Left 40-59% ICA stenosis. Left >50% ECA stenosis. Left vertebral artery demonstrates retrograde flow, suggestive of subclavian steal.    Farrel DemarkJill Eunice, RDMS, RVT   04/15/2016, 12:21 PM

## 2016-04-15 NOTE — Therapy (Signed)
[facility] [address] [place], [place], [address] Phone: [phone]   Fax:  [phone]  Physical Therapy Evaluation  Patient Details  Name: Melvin LappingBenjamin W Munter MRN: [id number] Date of Birth: 80-Mar-1929 Referring Provider: Dr. Evelene CroonBryan Bartle  Encounter Date: 04/15/2016      PT End of Session - 04/15/16 0929    Visit Number 1   PT Start Time [id number]   PT Stop Time 1005   PT Time Calculation (min) 40 min      Past Medical History:  Diagnosis Date  . Age-related macular degeneration, dry, both eyes   . Arthritis    "just about all over my body"  . Chronic combined systolic and diastolic CHF (congestive heart failure) (HCC)    a. 11/2015 Echo: EF 30-35%, diff HK, Gr1 DD.  Marland Kitchen. Coronary artery disease    a. 1991 PTCA of LAD; b. 1998 CABG x 5 (LIMA->LAD, VG->RPDA->RPLA, VG->OM1, VG->D2; c. 09/2014 MV: lat ischemia, EF 37%; d. 10/2014 PCI: LAD 100, LCX 99ost/100p, RCA 100p, LIMA->LAD ok, VG->D2 ok, VG->OM1 95(3.0x15 Resolute Integrity DES), VG->PDA ok w/ 30% w/in VG->PL.  Marland Kitchen. GERD (gastroesophageal reflux disease)   . High cholesterol   . History of hiatal hernia   . Hypertension   . Ischemic cardiomyopathy    a. 11/2015 Echo: EF 30-35%, diff HK, Gr1 DD.  Marland Kitchen. LBBB (left bundle branch block)   . Moderate aortic stenosis    a. 11/2015 Echo: EF 30-35%, diff HK, Gr1 DD, mod AS, mild AI, mildly dil Ao root and Asc Ao, mild MR, mod dil LA, PASP 46mmHg.  . Osteoporosis   . PAD (peripheral artery disease) (HCC)    a. s/p prior rotational atherectomy of L pop and tib trunk and PTA of focal popliteal lesions; b. 03/2015 ABI: R - 0.88, L 0.96, LCFA 50-79%, RSFA 50-79% distal stenosis, 3 vessel runoff.  . Palpitations    a. 06/2012 Event monitor: freq ventricular ectopy w/o VT.  Marland Kitchen. PVC (premature ventricular contraction) 04/26/1997   on beta blocker/pt had cardio net monitor 06/23/2012  . RLS (restless legs syndrome) 10/01/2012  . Type II diabetes  mellitus (HCC)   . Unspecified hereditary and idiopathic peripheral neuropathy 10/01/2012    Past Surgical History:  Procedure Laterality Date  . APPENDECTOMY    . CARDIAC CATHETERIZATION  04/26/1997   CAD - 50-60% prox to mid LAD with 70% stenosis in mid-distal, 70-80% 1st diag stenosis, 50-60% ostial stenosis in large 2nd diagonal; diffuse RCA stenosis with 40-50% osital narrowing with 70% prox stenosis (Dr. Bishop Limbo. Kelly) >> CABG  . CARDIAC CATHETERIZATION  11/01/2014  . CARDIAC CATHETERIZATION N/A 04/05/2016   Procedure: Right/Left Heart Cath and Coronary/Graft Angiography;  Surgeon: Lennette Biharihomas A Kelly, MD;  [facility];  Service: Cardiovascular;  Laterality: N/A;  . CATARACT EXTRACTION W/ INTRAOCULAR LENS  IMPLANT, BILATERAL Bilateral   . CORONARY ARTERY BYPASS GRAFT  04/1997   CABGx5 - LIMA to LAD, SVG to PDA, SVG to PLA, VG to OM1, VG to 2nd diagonal (Dr. Dorris FetchHendrickson)  . DOPPLER ECHOCARDIOGRAPHY  05/18/2012   EF 45-50%, LV systolic function mildly reduced; borderline LA enlargment; mild mitral annular calcif, mild-mod MR; mild TR with normal RVSP; mild calcification of AV leaflets and mild-mod valvular AS with mild regurg      . DOPPLER ECHOCARDIOGRAPHY  08/19/2011   EF 50-55%, mod conc LVH; mild mitral annular calcif with mild MR; mild-mod TR; AV mildly sclerotic with mild valvular AS and mild  regurg, mild aortic root dilatation  . HERNIA REPAIR    . HIATAL HERNIA REPAIR    . INGUINAL HERNIA REPAIR Bilateral   . LEFT AND RIGHT HEART CATHETERIZATION WITH CORONARY ANGIOGRAM N/A 11/01/2014   Procedure: LEFT AND RIGHT HEART CATHETERIZATION WITH CORONARY ANGIOGRAM;  Surgeon: Lennette Bihari, MD; EF35-40%, mild AS w/ 2+ AI, LAD, CFX, RCA 100%, LIMA-LAD OK, SVG-D1 OK, SVG-PDA 30%, SVG-OM 95%  . LOWER EXTREMITY ANGIOGRAM  02/05/2011   Diamondback orbital rotational atherectomy, percutaneous transluminal angioplasty of high-grade calcified popliteal & tibioperoneal stenosis (Dr. Erlene Quan)   . LOWER EXTREMITY ARTERIAL DOPPLER  02/2012   RLE - mild to mod arterial insuff; RSFA 50-69% diameter reduction; R pop 50-69% diameter reduction; posterior tibial (R) demonstrates occlusive disease; LSFA/prox pop 0-49% diameter reduction; L distal pop 50-69% diameter reduction; posterior tibial (L) demonstrates occlusive disease  . NM MYOCAR PERF EJECTION FRACTION  09/2011   lexiscan - no reversible ischmai, fixed anteroseptal defect r/t LBBB; EF 51%; septal hypokinesis; low risk but abnormal  . PERCUTANEOUS CORONARY STENT INTERVENTION (PCI-S) N/A 11/02/2014   Procedure: PERCUTANEOUS CORONARY STENT INTERVENTION (PCI-S);  Surgeon: Lennette Bihari, MD; 3.015 mm Resolute integrity DES to the SVG-OM   . TONSILLECTOMY      There were no vitals filed for this visit.       Subjective Assessment - 04/15/16 0933    Subjective Pt reports recent worsening of shortness of breath and fatigue and onset of chest pressure over past 3-4 months. Pt also reports onset of shortness of breath even at rest at times.    Patient Stated Goals get back to doing the things he needs to do without shortness of breath   Currently in Pain? No/denies            [facility] PT Assessment - 04/15/16 0001      Assessment   Medical Diagnosis severe aortic stenosis   Referring Provider Dr. Evelene Croon   Onset Date/Surgical Date 12/15/15  approximate     Precautions   Precautions None;Other (comment)  no treadmill walking     Restrictions   Weight Bearing Restrictions No     Balance Screen   Has the patient fallen in the past 6 months No   Has the patient had a decrease in activity level because of a fear of falling?  No   Is the patient reluctant to leave their home because of a fear of falling?  No     Home Environment   Living Environment Private residence   Living Arrangements Alone   Type of Home House   Home Access Stairs to enter   Entrance Stairs-Number of Steps 14   Entrance Stairs-Rails Left   Home  Layout Two level  split level     Prior Function   Level of Independence Independent with community mobility without device     Observation/Other Assessments-Edema    Edema --  moderate lower extremity edema noted     Posture/Postural Control   Posture/Postural Control Postural limitations   Postural Limitations Forward head;Rounded Shoulders;Posterior pelvic tilt     ROM / Strength   AROM / PROM / Strength AROM;Strength     AROM   Overall AROM Comments grossly WFL, shoulder flexion limited by about 25%     Strength   Overall Strength Comments grossly 4-/5 throughout UE and lower extremities   Strength Assessment Site Hand   Right/Left hand Right;Left   Right Hand Grip (lbs) 55  R  hand dominant   Left Hand Grip (lbs) 27  reports pain due to arthritis     Ambulation/Gait   Gait Pattern Trunk flexed;Decreased step length - right;Decreased step length - left  L>R          [facility] Pre-Surgical Assessment - 04/15/16 0001    5 Meter Walk Test- trial 1 8 sec   5 Meter Walk Test- trial 2 8 sec.    5 Meter Walk Test- trial 3 7 sec.  > 6 sec indicates slow speed   5 meter walk test average 7.67 sec   4 Stage Balance Test tolerated for:  10 sec.   4 Stage Balance Test Position 2   comment Inability to hold position 3 x 10 seconds indicates increased fall risk   Comment unable to arise without UE support from standard height chair   ADL/IADL Independent with: Bathing;Dressing;Meal prep;Finances   ADL/IADL Needs Assistance with: [place] work   ADL/IADL Fraility Index Vulnerable   Other comment Pt reports he is mostly responsible for housework and groceries due to his wife's inability to do it.    6 Minute Walk- Baseline yes   BP (mmHg) 143/57   HR (bpm) 50   02 Sat (%RA) 95 %   Modified Borg Scale for Dyspnea 0- Nothing at all   Perceived Rate of Exertion (Borg) 6-   6 Minute Walk Post Test yes   BP (mmHg) 157/69   HR (bpm) 62   02 Sat (%RA) 95 %   Modified Borg Scale for  Dyspnea 2- Mild shortness of breath   Perceived Rate of Exertion (Borg) 7- Very, very light   Aerobic Endurance Distance Walked 700   Endurance additional comments Pt did not require any rest breaks during the 6 minute walk. Pt did walk at slow speed throughout and demonstrates mild unsteadiness but no LOB.                           PT Education - 04/15/16 1010    Education provided Yes   Education Details fall risk   Person(s) Educated Patient   Methods Explanation   Comprehension Verbalized understanding                    Plan - 04/15/16 1010    Clinical Impression Statement Pt is an 82 yo male presenting to OP PT for evaluation prior to possible TAVR surgery due to severe aortic stenosis. Pt reports initial onset of shortness of breath and fatigue approximately 1 year ago with a worsening progression of shortness of breath, fatigue and chest pressure 3-4 months ago. Symptoms are limiting him from performing household responsibilities timely. Pt's wife has her own physical limitations and pt is responsible for groceries and many of the meals. Pt presents with fair ROM and strength, fair to poor balance and is at high fall risk per 4 stage balance test, slow walking speed and fair aerobic endurance per 6 minute walk test. Pt ambulated 700 feet in 6 minute walk test - 51% the normed distance for his age/gender. Pt reported 2/10 shortness of breath on modified scale for dyspnea.    PT Frequency One time visit   Consulted and Agree with Plan of Care Patient      Patient demonstrated the following deficits and impairments:     Visit Diagnosis: Muscle weakness (generalized) - Plan: PT plan of care cert/re-cert  Abnormal posture - Plan: PT plan of  care cert/re-cert  Other abnormalities of gait and mobility - Plan: PT plan of care cert/re-cert      G-Codes - May 02, 2016 1015    Functional Assessment Tool Used 6 minute walk - 700'    Functional Limitation  Mobility: Walking and moving around   Mobility: Walking and Moving Around Current Status [phone]) At least 40 percent but less than 60 percent impaired, limited or restricted   Mobility: Walking and Moving Around Goal Status [phone]) At least 40 percent but less than 60 percent impaired, limited or restricted   Mobility: Walking and Moving Around Discharge Status [phone]) At least 40 percent but less than 60 percent impaired, limited or restricted       Problem List Patient Active Problem List   Diagnosis Date Noted  . Palpitations 06/17/2015  . Skin lesion 02/11/2015  . Unstable angina pectoris (HCC) 11/03/2014  . CAD in native artery 09/28/2014  . SOB (shortness of breath) 04/26/2014  . Tachycardia 04/26/2014  . Non-insulin dependent type 2 diabetes mellitus (HCC) 07/02/2013  . Osteoporosis, unspecified 06/24/2013  . Edema, lower extremity 06/22/2013  . LBBB (left bundle branch block) 12/01/2012  . Heart palpitations 12/01/2012  . Aortic stenosis 12/01/2012  . Unspecified hereditary and idiopathic peripheral neuropathy 10/01/2012  . RLS (restless legs syndrome) 10/01/2012  . Hypertension 11/26/2011  . High cholesterol 11/26/2011  . Liver lesion 11/26/2011    Ngina Royer, PT 05/02/2016, 10:16 AM  [facility] [address] [place], [place], [address] Phone: [phone]   Fax:  [phone]  Name: BURNICE OESTREICHER MRN: [id number] Date of Birth: Nov 01, 1927

## 2016-04-17 ENCOUNTER — Institutional Professional Consult (permissible substitution) (INDEPENDENT_AMBULATORY_CARE_PROVIDER_SITE_OTHER): Payer: Medicare Other | Admitting: Thoracic Surgery (Cardiothoracic Vascular Surgery)

## 2016-04-17 ENCOUNTER — Encounter: Payer: Self-pay | Admitting: Thoracic Surgery (Cardiothoracic Vascular Surgery)

## 2016-04-17 ENCOUNTER — Other Ambulatory Visit: Payer: Self-pay | Admitting: *Deleted

## 2016-04-17 VITALS — BP 136/69 | HR 55 | Resp 16 | Ht 66.0 in | Wt 170.0 lb

## 2016-04-17 DIAGNOSIS — I2510 Atherosclerotic heart disease of native coronary artery without angina pectoris: Secondary | ICD-10-CM | POA: Diagnosis not present

## 2016-04-17 DIAGNOSIS — I350 Nonrheumatic aortic (valve) stenosis: Secondary | ICD-10-CM | POA: Diagnosis not present

## 2016-04-17 NOTE — Progress Notes (Signed)
[facility]  MULTIDISCIPLINARY HEART VALVE CLINIC  CARDIOTHORACIC SURGERY CONSULTATION REPORT  Referring Provider is Lennette Bihari, MD PCP is Colette Ribas, MD  Chief Complaint  Patient presents with  . 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 The patient's cardiac history dates back to 1991 when he underwent PTCA of his left anterior descending coronary artery. In 1998 he underwent coronary artery bypass grafting 5 by Dr. Dorris Fetch. He did well for many years but eventually developed worsening symptoms of exertional shortness of breath and fatigue. Symptoms have gradually progressed over several years and have become much more significant over the past 6 months. Catheterization in 2016 revealed patent left internal mammary artery to the distal left anterior descending coronary artery, patent saphenous vein graft to the diagonal branch, patent sequential saphenous vein graft to the posterior descending coronary artery and the right posterolateral branch, and severe disease in the patent saphenous vein graft to the obtuse marginal branch of the left circumflex coronary artery. He underwent PCI and stenting of the vein graft to the obtuse marginal branch using a drug eluding stent. Follow-up echocardiogram performed in May 2017 revealed significant left ventricular systolic dysfunction with ejection fraction 30-35% with moderate left  ventricular hypertrophy and moderate aortic stenosis with mean transvalvular gradient estimated 22 mmHg.  Because of concerns of the possibility of severe low ejection fraction low gradient aortic stenosis the patient underwent dobutamine stress echocardiography on 03/20/2016. This confirmed the presence of severe aortic stenosis with elevation of the peak velocity across the aortic valve to 3.9 m/s during dobutamine infusion. He was referred to the multidisciplinary heart valve team and evaluated by Dr. Clifton James on 03/27/2016.  Left and right heart catheterization was performed by Dr. Tresa Endo on 03/31/2016. This confirmed the presence of severe multivessel native coronary artery disease but continued patency of all of the previously placed bypass grafts from the patient's original coronary artery bypass surgery. The patient was noted to have at least moderate aortic insufficiency, and attempts to quantitate the severity of aortic stenosis at catheterization were felt to be inaccurate.  Pulmonary artery pressures were only mildly elevated. The patient was referred for surgical consultation and has been evaluated previously by Dr. Laneta Simmers. The patient was felt to be relatively high risk for conventional surgical aortic valve replacement and has subsequently undergone CT angiography to further characterize the feasibility of transcatheter aortic valve replacement. The patient has been referred for a second surgical opinion.  The patient is married and lives locally in [place] with his wife.  He has been retired since 1994. He states that over the past several years he has become increasingly sedentary because of worsening symptoms of exertional shortness of breath and fatigue. He now gets short of breath and tired with low level activity. He denies any problems with resting shortness of breath but he can no longer lay flat in bed and sleeps comfortably because of orthopnea. He has significant bilateral lower  extremity edema and has been dependent on oral diuretics  for several years.  He occasionally gets dizzy when he stands up or bends over at the waist but he has not had any syncopal episodes. He describes some symptoms of chest discomfort with activity but none at rest. His mobility is limited primarily by fatigue and shortness of breath. He remains functionally independent, drives an automobile, and tends to his primary daily needs. However, he has had to give up doing most of the chores he used to do around the house including yard work and other chores that he previously enjoyed.  Past Medical History:  Diagnosis Date  . Age-related macular degeneration, dry, both eyes   . Arthritis    "just about all over my body"  . Chronic combined systolic and diastolic CHF (congestive heart failure) (HCC)    a. 11/2015 Echo: EF 30-35%, diff HK, Gr1 DD.  Marland Kitchen Coronary artery disease    a. 1991 PTCA of LAD; b. 1998 CABG x 5 (LIMA->LAD, VG->RPDA->RPLA, VG->OM1, VG->D2; c. 09/2014 MV: lat ischemia, EF 37%; d. 10/2014 PCI: LAD 100, LCX 99ost/100p, RCA 100p, LIMA->LAD ok, VG->D2 ok, VG->OM1 95(3.0x15 Resolute Integrity DES), VG->PDA ok w/ 30% w/in VG->PL.  Marland Kitchen GERD (gastroesophageal reflux disease)   . High cholesterol   . History of hiatal hernia   . Hypertension   . Ischemic cardiomyopathy    a. 11/2015 Echo: EF 30-35%, diff HK, Gr1 DD.  Marland Kitchen LBBB (left bundle branch block)   . Moderate aortic stenosis    a. 11/2015 Echo: EF 30-35%, diff HK, Gr1 DD, mod AS, mild AI, mildly dil Ao root and Asc Ao, mild MR, mod dil LA, PASP .  . Osteoporosis   . PAD (peripheral artery disease) (HCC)    a. s/p prior rotational atherectomy of L pop and tib trunk and PTA of focal popliteal lesions; b. 03/2015 ABI: R - 0.88, L 0.96, LCFA 50-79%, RSFA 50-79% distal stenosis, 3 vessel runoff.  . Palpitations    a. 06/2012 Event monitor: freq ventricular ectopy w/o VT.  Marland Kitchen PVC (premature ventricular contraction) 04/26/1997   on beta  blocker/pt had cardio net monitor 06/23/2012  . RLS (restless legs syndrome) 10/01/2012  . Type II diabetes mellitus (HCC)   . Unspecified hereditary and idiopathic peripheral neuropathy 10/01/2012    Past Surgical History:  Procedure Laterality Date  . APPENDECTOMY    . CARDIAC CATHETERIZATION  04/26/1997   CAD - 50-60% prox to mid LAD with 70% stenosis in mid-distal, 70-80% 1st diag stenosis, 50-60% ostial stenosis in large 2nd diagonal; diffuse RCA stenosis with 40-50% osital narrowing with 70% prox stenosis (Dr. Bishop Limbo) >> CABG  . CARDIAC CATHETERIZATION  11/01/2014  . CARDIAC CATHETERIZATION N/A 04/05/2016   Procedure: Right/Left Heart Cath and Coronary/Graft Angiography;  Surgeon: Lennette Bihari, MD;  Location: [facility] INVASIVE CV LAB;  Service: Cardiovascular;  Laterality: N/A;  . CATARACT EXTRACTION W/ INTRAOCULAR LENS  IMPLANT, BILATERAL Bilateral   . CORONARY ARTERY BYPASS GRAFT  04/1997   CABGx5 - LIMA to LAD, SVG to PDA, SVG to PLA, VG to OM1, VG to 2nd diagonal (Dr. Dorris Fetch)  . DOPPLER ECHOCARDIOGRAPHY  05/18/2012   EF 45-50%, LV systolic function mildly reduced; borderline LA enlargment; mild mitral annular calcif, mild-mod MR; mild TR with normal RVSP; mild calcification of AV leaflets and mild-mod valvular AS with mild regurg      . DOPPLER ECHOCARDIOGRAPHY  08/19/2011   EF 50-55%, mod conc LVH; mild mitral annular calcif with mild MR; mild-mod TR; AV mildly sclerotic with mild  valvular AS and mild regurg, mild aortic root dilatation  . HERNIA REPAIR    . HIATAL HERNIA REPAIR    . INGUINAL HERNIA REPAIR Bilateral   . LEFT AND RIGHT HEART CATHETERIZATION WITH CORONARY ANGIOGRAM N/A 11/01/2014   Procedure: LEFT AND RIGHT HEART CATHETERIZATION WITH CORONARY ANGIOGRAM;  Surgeon: Lennette Bihari, MD; EF35-40%, mild AS w/ 2+ AI, LAD, CFX, RCA 100%, LIMA-LAD OK, SVG-D1 OK, SVG-PDA 30%, SVG-OM 95%  . LOWER EXTREMITY ANGIOGRAM  02/05/2011   Diamondback orbital rotational atherectomy,  percutaneous transluminal angioplasty of high-grade calcified popliteal & tibioperoneal stenosis (Dr. Erlene Quan)  . LOWER EXTREMITY ARTERIAL DOPPLER  02/2012   RLE - mild to mod arterial insuff; RSFA 50-69% diameter reduction; R pop 50-69% diameter reduction; posterior tibial (R) demonstrates occlusive disease; LSFA/prox pop 0-49% diameter reduction; L distal pop 50-69% diameter reduction; posterior tibial (L) demonstrates occlusive disease  . NM MYOCAR PERF EJECTION FRACTION  09/2011   lexiscan - no reversible ischmai, fixed anteroseptal defect r/t LBBB; EF 51%; septal hypokinesis; low risk but abnormal  . PERCUTANEOUS CORONARY STENT INTERVENTION (PCI-S) N/A 11/02/2014   Procedure: PERCUTANEOUS CORONARY STENT INTERVENTION (PCI-S);  Surgeon: Lennette Bihari, MD; 3.015 mm Resolute integrity DES to the SVG-OM   . TONSILLECTOMY      Family History  Problem Relation Age of Onset  . CAD Mother   . Heart attack Father     Social History   Social History  . Marital status: Married    Spouse name: Maralyn Sago  . Number of children: 1  . Years of education: College   Occupational History  .  Retired   Social History Main Topics  . Smoking status: Never Smoker  . Smokeless tobacco: Never Used  . Alcohol use No  . Drug use: No  . Sexual activity: No   Other Topics Concern  . Not on file   Social History Narrative   Pt lives at home with his spouse.   Has been married 28yrs.   Caffeine ZOX:WRUEAV, is right handed    Current Outpatient Prescriptions  Medication Sig Dispense Refill  . amLODipine (NORVASC) 10 MG tablet Take 0.5 tablets (5 mg total) by mouth daily. KEEP OV. 90 tablet 0  . aspirin 81 MG tablet Take 81 mg by mouth every evening.     Marland Kitchen atorvastatin (LIPITOR) 40 MG tablet TAKE 1 TABLET DAILY AT 6 P.M. (KEEP OFFICE VISIT) 90 tablet 2  . cetirizine (ZYRTEC) 10 MG tablet Take 10 mg by mouth daily.    . clopidogrel (PLAVIX) 75 MG tablet TAKE 1 TABLET DAILY (KEEP OFFICE VISIT) 90  tablet 2  . gabapentin (NEURONTIN) 300 MG capsule Take 1 capsule (300 mg total) by mouth at bedtime. 90 capsule 3  . hydrALAZINE (APRESOLINE) 10 MG tablet Take 2 tablets (20 mg total) by mouth every 8 (eight) hours. 270 tablet 1  . metformin (FORTAMET) 1000 MG (OSM) 24 hr tablet Take 1,000 mg by mouth 2 (two) times daily with a meal.    . metoprolol (LOPRESSOR) 100 MG tablet Take 1 tablet (100 mg total) by mouth 2 (two) times daily. 180 tablet 1  . Multiple Vitamins-Minerals (PRESERVISION AREDS 2 PO) Take 1 capsule by mouth 2 (two) times daily.     . multivitamin-iron-minerals-folic acid (CENTRUM) chewable tablet Chew 1 tablet by mouth daily.    . niacin (NIASPAN) 750 MG CR tablet Take 2 tablets (1,500 mg total) by mouth at bedtime. 180 tablet 1  . nitroGLYCERIN (NITROSTAT) 0.4  MG SL tablet Place 1 tablet (0.4 mg total) under the tongue every 5 (five) minutes as needed for chest pain. 25 tablet 3  . oxymetazoline (AFRIN) 0.05 % nasal spray Place 2 sprays into both nostrils 3 (three) times daily as needed for congestion (Allergies).    . polycarbophil (FIBERCON) 625 MG tablet Take 1,250 mg by mouth daily.     Marland Kitchen torsemide (DEMADEX) 20 MG tablet Take 2 tablets in the the morning and 1 tablet. 180 tablet 1  . zoledronic acid (RECLAST) 5 MG/100ML SOLN Inject 100 mLs (5 mg total) into the vein once. 100 mL 0   No current facility-administered medications for this visit.     Allergies  Allergen Reactions  . Other     Patient is allergic to something but doesn't know the name.  . Pramipexole Swelling      Review of Systems:   General:  normal appetite, decreased energy, no weight gain, no weight loss, no fever  Cardiac:  + chest pain with exertion, no chest pain at rest, + SOB with exertion, occasional resting SOB, no PND, + orthopnea, + palpitations, no arrhythmia, no atrial fibrillation, + LE edema, + dizzy spells, no syncope  Respiratory:  + shortness of breath, no home oxygen, no productive  cough, + dry cough, no bronchitis, no wheezing, no hemoptysis, + asthma, no pain with inspiration or cough, + sleep apnea, no CPAP at night  GI:   no difficulty swallowing, no reflux, no frequent heartburn, no hiatal hernia, no abdominal pain, no constipation, no diarrhea, no hematochezia, no hematemesis, no melena  GU:   no dysuria,  no frequency, no urinary tract infection, no hematuria, no enlarged prostate, no kidney stones, no kidney disease  Vascular:  no pain suggestive of claudication, no pain in feet, no leg cramps, no varicose veins, no DVT, no non-healing foot ulcer  Neuro:   no stroke, no TIA's, no seizures, no headaches, no temporary blindness one eye,  no slurred speech, no peripheral neuropathy, no chronic pain, no instability of gait, no memory/cognitive dysfunction  Musculoskeletal: mild arthritis, no joint swelling, no myalgias, no difficulty walking, normal mobility   Skin:   no rash, no itching, no skin infections, no pressure sores or ulcerations  Psych:   no anxiety, no depression, no nervousness, no unusual recent stress  Eyes:   no blurry vision, no floaters, no recent vision changes, does not wears glasses or contacts  ENT:   + hearing loss, no loose or painful teeth, no dentures, last saw dentist January 2017  Hematologic:  no easy bruising, no abnormal bleeding, no clotting disorder, no frequent epistaxis  Endocrine:  + diabetes, does check CBG's at home           Physical Exam:   BP 136/69 (BP Location: Right Arm, Patient Position: Sitting, Cuff Size: Normal)   Pulse (!) 55   Resp 16   Ht 5\' 6"  (1.676 m)   Wt 170 lb (77.1 kg)   SpO2 98% Comment: on RA  BMI 27.44 kg/m   General:  Elderly and frail-appearing  HEENT:  Unremarkable   Neck:   no JVD, no bruits, no adenopathy   Chest:   clear to auscultation, symmetrical breath sounds, no wheezes, no rhonchi   CV:   RRR, grade III/VI crescendo/decrescendo murmur heard best at RUSB, + no diastolic  murmur  Abdomen:  soft, non-tender, no masses   Extremities:  warm, well-perfused, pulses diminished, 3+ bilateral LE edema  Rectal/GU  Deferred  Neuro:   Grossly non-focal and symmetrical throughout  Skin:   Clean and dry, no rashes, no breakdown   Diagnostic Tests:  Transthoracic Echocardiography  Patient: Shermaine, Brigham MR #: [id number] Study Date: 11/21/2015 Gender: M Age: 43 Height: 167.6 cm Weight: 79.4 kg BSA: 1.94 m^2 Pt. Status: Room:  ATTENDING Olga Millers ORDERING Barrett, Joline Salt REFERRING Barrett, Joline Salt SONOGRAPHER Dewitt Hoes, RDCS PERFORMING [facility]  cc:  ------------------------------------------------------------------- LV EF: 30% - 35%  ------------------------------------------------------------------- Indications: Aortic stenosis (I35.0).  ------------------------------------------------------------------- History: PMH: Dyspnea. Coronary artery disease. Aortic valve disease. Risk factors: LBBB. Palpitation. Tachycardia. Hypertension. Diabetes mellitus. Dyslipidemia.  ------------------------------------------------------------------- Study Conclusions  - Left ventricle: The cavity size was normal. Wall thickness was increased in a pattern of moderate LVH. Systolic function was moderately to severely reduced. The estimated ejection fraction was in the range of 30% to 35%. Diffuse hypokinesis. Doppler parameters are consistent with abnormal left ventricular relaxation (grade 1 diastolic dysfunction). Doppler parameters are consistent with high ventricular filling pressure. - Aortic valve: Valve mobility was restricted. There was moderate stenosis. There was mild regurgitation. - Aortic root: The aortic root was mildly dilated. - Ascending aorta: The ascending aorta was mildly dilated. - Mitral valve: There was mild regurgitation. -  Left atrium: The atrium was moderately dilated. - Right ventricle: The cavity size was mildly dilated. - Right atrium: The atrium was mildly dilated. - Pulmonary arteries: Systolic pressure was moderately increased. PA peak pressure: 46 mm Hg (S).  Impressions:  - Moderate to severe global reduction in LV function; moderate LVH; grade 1 diastolic dysfunction with elevated LV filling pressure; moderate LAE; calcified aortic valve with moderate AS (mean gradient of 22 mmHg; AVA 1.1 cm2) and mild AI; mild MR; moderate LAE; mild RAE and RVE; mild TR with moderately elevated pulmonary pressure.  ------------------------------------------------------------------- Labs, prior tests, procedures, and surgery: Transthoracic echocardiography (05/10/2014). The aortic valve showed mild stenosis. The aortic valve showed mild regurgitation. EF was 40%. Aortic valve: peak gradient of 32 mm Hg and mean gradient of 15 mm Hg.  Transthoracic echocardiography. M-mode, complete 2D, spectral Doppler, and color Doppler. Birthdate: Patient birthdate: 05-Jun-1928. Age: Patient is 82 yr old. Sex: Gender: male. BMI: 28.3 kg/m^2. Blood pressure: 132/70 Patient status: Outpatient. Study date: Study date: 11/21/2015. Study time: 12:46 PM. Location: [facility]  -------------------------------------------------------------------  ------------------------------------------------------------------- Left ventricle: The cavity size was normal. Wall thickness was increased in a pattern of moderate LVH. Systolic function was moderately to severely reduced. The estimated ejection fraction was in the range of 30% to 35%. Diffuse hypokinesis. Doppler parameters are consistent with abnormal left ventricular relaxation (grade 1 diastolic dysfunction). Doppler parameters are consistent with high ventricular filling  pressure.  ------------------------------------------------------------------- Aortic valve: Trileaflet; moderately calcified leaflets. Valve mobility was restricted. Doppler: There was moderate stenosis. There was mild regurgitation. VTI ratio of LVOT to aortic valve: 0.27. Valve area (VTI): 1.12 cm^2. Indexed valve area (VTI): 0.58 cm^2/m^2. Peak velocity ratio of LVOT to aortic valve: 0.27. Valve area (Vmax): 1.11 cm^2. Indexed valve area (Vmax): 0.57 cm^2/m^2. Mean velocity ratio of LVOT to aortic valve: 0.26. Valve area (Vmean): 1.07 cm^2. Indexed valve area (Vmean): 0.55 cm^2/m^2. Mean gradient (S): 22 mm Hg. Peak gradient (S): 40 mm Hg.  ------------------------------------------------------------------- Aorta: Aortic root: The aortic root was mildly dilated. Ascending aorta: The ascending aorta was mildly dilated.  ------------------------------------------------------------------- Mitral valve: Structurally normal valve. Mobility was not restricted. Doppler: Transvalvular velocity was within the normal range. There was no evidence  for stenosis. There was mild regurgitation. Peak gradient (D): 3 mm Hg.  ------------------------------------------------------------------- Left atrium: The atrium was moderately dilated.  ------------------------------------------------------------------- Right ventricle: The cavity size was mildly dilated. Systolic function was normal.  ------------------------------------------------------------------- Pulmonic valve: Doppler: Transvalvular velocity was within the normal range. There was no evidence for stenosis.  ------------------------------------------------------------------- Tricuspid valve: Structurally normal valve. Doppler: Transvalvular velocity was within the normal range. There was mild regurgitation.  ------------------------------------------------------------------- Pulmonary artery:  Systolic pressure was moderately increased.  ------------------------------------------------------------------- Right atrium: The atrium was mildly dilated.  ------------------------------------------------------------------- Pericardium: There was no pericardial effusion.  ------------------------------------------------------------------- Systemic veins: Inferior vena cava: The vessel was normal in size.  ------------------------------------------------------------------- Measurements  Left ventricle Value Reference LV ID, ED, PLAX chordal (L) 39.1 mm 43 - 52 LV ID, ES, PLAX chordal 33.6 mm 23 - 38 LV fx shortening, PLAX chordal (L) 14 % >=29 LV PW thickness, ED 12.9 mm --------- IVS/LV PW ratio, ED 1.11 <=1.3 Stroke volume, 2D 97 ml --------- Stroke volume/bsa, 2D 50 ml/m^2 --------- LV e&', lateral 5.57 cm/s --------- LV E/e&', lateral 14.97 --------- LV e&', medial 2.68 cm/s --------- LV E/e&', medial 31.12 --------- LV e&', average 4.13 cm/s --------- LV E/e&', average 20.22 ---------  Ventricular septum Value Reference IVS thickness, ED 14.3 mm ---------  LVOT Value Reference LVOT ID, S 23 mm --------- LVOT area 4.15 cm^2 --------- LVOT peak velocity, S 84.3 cm/s --------- LVOT mean velocity, S 56.4  cm/s --------- LVOT VTI, S 23.4 cm ---------  Aortic valve Value Reference Aortic valve peak velocity, S 315 cm/s --------- Aortic valve mean velocity, S 218 cm/s --------- Aortic valve VTI, S 86.4 cm --------- Aortic mean gradient, S 22 mm Hg --------- Aortic peak gradient, S 40 mm Hg --------- VTI ratio, LVOT/AV 0.27 --------- Aortic valve area, VTI 1.12 cm^2 --------- Aortic valve area/bsa, VTI 0.58 cm^2/m^2 --------- Velocity ratio, peak, LVOT/AV 0.27 --------- Aortic valve area, peak velocity 1.11 cm^2 --------- Aortic valve area/bsa, peak 0.57 cm^2/m^2 --------- velocity Velocity ratio, mean, LVOT/AV 0.26 --------- Aortic valve area, mean velocity 1.07 cm^2 --------- Aortic valve area/bsa, mean 0.55 cm^2/m^2 --------- velocity Aortic regurg pressure half-time 581 ms ---------  Aorta Value Reference Aortic root ID, ED 40 mm --------- Ascending aorta ID, A-P, S 39 mm ---------  Left atrium Value Reference LA ID, A-P, ES 42 mm --------- LA ID/bsa, A-P 2.16 cm/m^2 <=2.2 LA volume, S 81.4 ml --------- LA volume/bsa, S 41.9 ml/m^2 --------- LA volume, ES, 1-p A4C 54.7 ml --------- LA volume/bsa, ES, 1-p A4C 28.2 ml/m^2 --------- LA volume, ES, 1-p A2C  104 ml --------- LA volume/bsa, ES, 1-p A2C 53.5 ml/m^2 ---------  Mitral valve Value Reference Mitral E-wave peak velocity 83.4 cm/s --------- Mitral A-wave peak velocity 115 cm/s --------- Mitral deceleration time (H) 380 ms 150 - 230 Mitral peak gradient, D 3 mm Hg --------- Mitral E/A ratio, peak 0.7 ---------  Pulmonary arteries Value Reference PA pressure, S, DP (H) 46 mm Hg <=30  Tricuspid valve Value Reference Tricuspid regurg peak velocity 309 cm/s --------- Tricuspid peak RV-RA gradient 38 mm Hg ---------  Systemic veins Value Reference Estimated CVP 8 mm Hg ---------  Right ventricle Value Reference RV pressure, S, DP (H) 46 mm Hg <=30 RV s&', lateral, S 7.08 cm/s ---------  Legend: (L) and (H) mark values outside specified reference range.  ------------------------------------------------------------------- Prepared and Electronically Authenticated by  Olga Millers 2017-05-16T16:13:27   [facility] [address] [place], [place] [address] [phone]  ------------------------------------------------------------------- Stress Echocardiography  Patient: Madox, Corkins MR #: [id number] Study Date: 03/20/2016 Gender: M Age: 61 Height: 167.6 cm Weight: 78.6 kg BSA: 1.93 m^2 Pt.  Status: Room:  ATTENDING Charlton Haws, M.D. REFERRING [place] Found [id number] SONOGRAPHER Randa Evens, Will ORDERING Nicolasa Ducking, MD REFERRING Nicolasa Ducking, MD PERFORMING [facility] Out[facility]  cc:  -------------------------------------------------------------------  ------------------------------------------------------------------- Indications: (I35.0).  ------------------------------------------------------------------- History: PMH: Aortic stenosis.  ------------------------------------------------------------------- Study Conclusions  - Impressions: Stage: Peak Vel Mean Gradient Peak Gradient AVA  Baseline 3.4 m/sec 25 mmHg 46 mmHg 1.02 cm2 Low Dose 3.56 m/sec 28 mmHg 51 mmHg .92 cm2 Mid Dose 3.74 m/sec 31 mmHg 56 mmHg 1.08 cm2 High Dose 3.86 m/sec 31 mmHg 60 mmHg 1.04 cm2  SV increased by 22% with Dobutamine infusion indictating some contractile reserve Overall findings consistent wtih moderate to severe fixed AS  Impressions:  - Stage: Peak Vel Mean Gradient Peak Gradient AVA  Baseline 3.4 m/sec 25 mmHg 46 mmHg 1.02 cm2 Low Dose 3.56 m/sec 28 mmHg 51 mmHg .92 cm2 Mid Dose 3.74 m/sec 31 mmHg 56 mmHg 1.08 cm2 High Dose 3.86 m/sec 31 mmHg 60 mmHg 1.04 cm2  SV increased by 22% with Dobutamine infusion indictating some contractile reserve Overall findings consistent wtih moderate to severe fixed AS  ------------------------------------------------------------------- Study data: Study status: Routine. Consent: The risks, benefits, and alternatives to the procedure were explained to the patient and informed consent was obtained. Procedure: The patient reported no pain pre or post test. Initial setup. The patient was brought to the laboratory. A baseline ECG was recorded. Surface ECG leads and automatic cuff blood pressure measurements were monitored. Dobutamine stress test. Stress  testing was performed, with dobutamine infusion from 5 to 20 mcg/kg/min. The infusion was terminated due to maximal dose administration. Transthoracic stress echocardiography for assessment of valvular function. Images were captured at baseline, low dose, peak dose, and recovery. Study completion: The patient tolerated the procedure well. There were no complications. Dobutamine. Stress echocardiography. 2D. Birthdate: Patient birthdate: 01-29-1928. Age: Patient is 81 yr old. Sex: Gender: male. BMI: 28 kg/m^2. Blood pressure: 142/64 Patient status: Outpatient. Study date: Study date: 03/20/2016. Study time: 03:13 PM.  -------------------------------------------------------------------  ------------------------------------------------------------------- Aortic valve: Doppler: VTI ratio of LVOT to aortic valve: 0.22. Valve area (VTI): 0.99 cm^2. Indexed valve area (VTI): 0.51 cm^2/m^2. Peak velocity ratio of LVOT to aortic valve: 0.23. Valve area (Vmax): 1.04 cm^2. Indexed valve area (Vmax): 0.54 cm^2/m^2. Mean gradient (S): 28 mm Hg. Peak gradient (S): 51 mm Hg.  ------------------------------------------------------------------- Stress protocol:  +----------------------+--+----------+-------------------+--------+ Stage HRBP (mmHg) Rhythm Symptoms +----------------------+--+----------+-------------------+--------+ Baseline 54142/64 Occasional PVC&'s None    (90)    +----------------------+--+----------+-------------------+--------+ Dobutamine 5 ug/kg/min51124/52 ---------------------------   (76)    +----------------------+--+----------+-------------------+--------+ Dobutamine 10 52132/44 --------------------------- ug/kg/min   (73)    +----------------------+--+----------+-------------------+--------+ Dobutamine 20 56136/40 Ventricular -------- ug/kg/min  (72) couplets   +----------------------+--+----------+-------------------+--------+ Immediate post stress 57134/42 Occasional PVC&'s --------   (73)    +----------------------+--+----------+-------------------+--------+ Recovery; 1 min 58------------------------------------- +----------------------+--+----------+-------------------+--------+ Recovery; 2 min 58----------Ventricular --------    couplets   +----------------------+--+----------+-------------------+--------+ Recovery; 3 min 60142/48 Ventricular --------   (79) couplets   +----------------------+--+----------+-------------------+--------+ Recovery; 4 min 60------------------------------------- +----------------------+--+----------+-------------------+--------+ Recovery; 5 min 55----------Occasional PVC&'s -------- +----------------------+--+----------+-------------------+--------+ Recovery; 6 min 54144/60 Occasional PVC&'s --------   (88)    +----------------------+--+----------+-------------------+--------+ Recovery; 7 min 54----------Occasional PVC&'s -------- +----------------------+--+----------+-------------------+--------+  ------------------------------------------------------------------- Stress results: Maximal heart rate during stress was 60 bpm (45% of maximal predicted heart rate). The maximal predicted heart rate was 134 bpm.The target heart rate was  achieved. The heart rate response to stress was blunted.  There was a normal resting blood pressure. Abnormal blood pressure response to dobutamine. The rate-pressure product for the peak heart rate and blood pressure was 8520 mm Hg/min. The patient experienced no chest pain during stress.  ------------------------------------------------------------------- Stress ECG: SR nonspecific ST changes Non diagnostic  ------------------------------------------------------------------- Measurements  Left ventricle Value Stroke volume, 2D 93 ml Stroke volume/bsa, 2D 48 ml/m^2  LVOT Value LVOT ID, S 24 mm LVOT area 4.52 cm^2 LVOT peak velocity, S 82.7 cm/s LVOT VTI, S 20.5 cm LVOT peak gradient, S 3 mm Hg  Aortic valve Value Aortic valve peak velocity, S 358 cm/s Aortic valve mean velocity, S 249 cm/s Aortic valve VTI, S 93.7 cm Aortic mean gradient, S 28 mm Hg Aortic peak gradient, S 51 mm Hg VTI ratio, LVOT/AV 0.22 Aortic valve area, VTI 0.99 cm^2 Aortic valve area/bsa, VTI 0.51 cm^2/m^2 Velocity ratio, peak, LVOT/AV 0.23 Aortic valve area, peak velocity 1.04 cm^2 Aortic valve area/bsa, peak velocity 0.54 cm^2/m^2  Legend: (L) and (H) mark values outside specified reference range.  ------------------------------------------------------------------- Prepared and Electronically Authenticated by  Charlton Haws, M.D. 2017-09-13T16:21:46  Physicians   Panel Physicians Referring Physician Case Authorizing  Physician  Lennette Bihari, MD (Primary)    Procedures   Right/Left Heart Cath and Coronary/Graft Angiography  Conclusion     Ost 2nd Diag to 2nd Diag lesion, 90 %stenosed.  Mid LAD to Dist LAD lesion, 100 %stenosed.  Ost Cx lesion, 100 %stenosed.  Ost RCA lesion, 100 %stenosed.  SVG.  SVG.  SVG.  Origin to Prox Graft lesion, 20 %stenosed.  LIMA.  There is moderate (3+) aortic regurgitation.  Severe multivessel native CAD with 90% stenosis in the second diagonal branch of the LAD and total occlusion of the LAD after the proximal septal perforating artery; total occlusion of the left circumflex vessel ostially and total occlusion of the RCA ostium.  Patent LIMA graft which supplies the mid LAD. There is a smooth 10% ostial narrowing. The subclavian vessel takes off much more rightward from the flattened aortic arch.  Patent SVG which supplies the circumflex marginal vessel. A stent is in the proximal portion of the graft which is patent and has smooth inferior intimal hyperplasia of less than 20%.  Patent SVG which supplies the second diagonal vessel of the LAD.  Patent SVG which supplies the mid PDA. A sequential limb was not visualized, but the distal RCA gave rise to 3 additional branches beyond the PDA takeoff.  Supravalvular aortography mentioning a dilated aortic root with markedly reduced aortic valve excursion, and at least 2+ angiographic aortic insufficiency.  Significant aortic valve stenosis which has been documented on dobutamine echo as low gradient severe AS.  Mild pulmonary hypertension.  RECOMMENDATION: The present catheterization continues to demonstrate patency to the vein graft conduits as well as the left internal mammary artery with severe native CAD. The patient has experienced a definite decline in functional capacity and on recent echo Doppler study EF has declined to 35%. I have recommended that he follow-up with Dr. Sanjuana Kava  to proceed with TAVR and additional imaging requirements prior to final recommendations.   Indications   Coronary artery disease involving coronary bypass graft of native heart with other forms of angina pectoris (HCC) [I25.708 (ICD-10-CM)]  Aortic stenosis [I35.0 (ICD-10-CM)]  Cardiomyopathy (HCC) [I42.9 (ICD-10-CM)]  Procedural Details/Technique   Technical Details Mr. Vinal Rosengrant is an 82 year old Caucasian male who has a history of HTN, HLD, DM, CAD s/p CABG and prior PCI, ischemic cardiomyopathy, diabetes, PAD  with prior lower extremity interventions, LBBB, PVCS and progressive AV stenosis. He underwent PTCA of his LAD in 1991 and in 1998 underwent 5V CABG. In March of 2016 he developed progressive fatigue and dyspnea and repeat cardiac cath showed patent LIMA to LAD, patent SVG to diagonal, patent sequential SVG to PDA and PLA with severe disease in the SVG to OM. A Resolute DES stent was placed in the SVG to the obtuse marginal. He has developed progressive aortic valve stenosis. His echo in May 2017 showed LVEF=30-35% with moderate LVH, diffuse hypokinesis of the LV with moderate aortic stenosis with mean gradient of 22 mmHg and AVA 1.1cm2. There was concern for low output aortic stenosis. A dobutamine stress echo on 03/20/16 demonstrated elevation of velocity across the aortic valve with infusion of dobutamine (3.86 m/sec with mean gradient 31 mm Hg, peak gradient 60 mmHg, AVA 1.0cm2). He was evaluated by Dr. Sanjuana Kava for consideration for TAVR. He presents for right and left heart catheterization prior to final recommendations.  The patient presented to the catheterization laboratory in the fasting state. His right femoral artery and right femoral vein were punctured anteriorly and a arterial sheath and venous sheaths were inserted. A Swan-Ganz catheter was inserted into the venous sheath and was advanced to the RA and RV position. However, this catheter even with wire assistance was  unable to enter the RVOT, and PA and ultimately was removed. Diagnostic catheterization was then performed. The the arterial sheath and 5 French Judkins 5 affect and JR4 right catheters were used for angiography into the native coronary arteries as well as into all 3 vein grafts. A LIMA catheter was necessary for selective angiography into the left internal mammary artery. Of note, due to the elongated aortic arch, the subclavian takeoff was much more rightward than normally. An S shaped Swan catheter was then inserted and with a Glidewire support. Ultimately, the catheter was able to be passed into the pulmonary artery and wedge positions. Oxygen saturation was obtained in the pulmonary artery and aorta. Cardiac output determinations were done both by the thermodilution and Fick methods. A pigtail catheter was then inserted and advanced to the central aorta. Central AO and PA pressures were recorded. Supravalvular aortography was performed. An attempt was made to cross the aortic valve, although the wire was crossed. The pigtail did not cross the system. Pullback. The pigtail catheter was then removed and a right catheter was inserted and with a long straight wire the aortic valve was crossed and the RC catheter was placed into the ventricle. Since the patient had a recent echo Doppler study showing reduced LV function. Left ventriculography was not performed, but I LV to AO pullback was done. During the pullback, the patient had his typical PVCs which made the pressure determinations difficult to be calculated by the software. All catheters were removed from the patient. The patient tolerated the procedure well and returned to his room in stable condition with plans for discharge later today.   Estimated blood loss <50 mL.  During this procedure the patient was administered the following to achieve and maintain moderate conscious sedation: Versed 1 mg, Fentanyl 25 mcg, while the patient's heart rate, blood  pressure, and oxygen saturation were continuously monitored. The period of conscious sedation was 75 minutes, of which I was present face-to-face 100% of this time.    Coronary Findings   Dominance: Right  Left Main  The left main was long vessel which essentially gave rise to the LAD and  2 proximal diagonal vessels. The circumflex was occluded at its origin.  Left Anterior Descending  The LAD gave rise to 2 proximal diagonal vessels. The second diagonal vessel had proximal 90% stenosis. He septal perforating artery arose after the diagonal vessel and the LAD was occluded beyond the septal perforating artery.  Mid LAD to Dist LAD lesion, 100% stenosed.  Second Diagonal [employer] 2nd Diag to 2nd Diag lesion, 90% stenosed.  Left Circumflex  Left circumflex vessel was occluded at its origen.  Ost Cx lesion, 100% stenosed.  Right Coronary Artery  RCA was occluded at its origin.  Ost RCA lesion, 100% stenosed.  Graft Angiography  saphenous Graft to RPDA  SVG.  saphenous Graft to 2nd Diag  SVG.  saphenous Graft to 1st Mrg  SVG.  Origin to Prox Graft lesion, 20% stenosed. The lesion was previously treated.  Free LIMA Graft to Dist LAD  LIMA.  Right Heart   Right Heart Pressures RA: a 2; V 5; mean 3 RV: 37/5 PA: 37/10 PW: a 12 ; v 13; mean 9  AO: 139/44 PA: 30/12  LV 165/10 PW: a 13; v 22; mean 11  The highest LV pressure recorded was 176/9. On pullback, there was ectopy and several consecutive beats could not be accurately calibrated which may contribute to erroneous data.  LV:163/18 AO: 152/40  Cardiac output by the Fick method was 6.6, and by the thermodilution was 5.9 L/m Cardiac index by the Fick method was 3.5 and by the thermodilution method was 3.1 L/m/m. The pullback pressures may not be completely accurate in the software calculated mean pressure was 17.6 with a 1.3 cm AVA    Left Heart   Left Ventricle LVEDP 10 mm Hg    Aorta Aortic Root: The aortic  root displays dilation. There is moderate (3+) aortic regurgitation.   Aortic Arch: Supravalvular aortography demonstrated a dilated aortic root with significantly reduced aortic valve excursion at least moderate aortic insufficiency.    Coronary Diagrams   Diagnostic Diagram     Implants        No implant documentation for this case.  PACS Images   Show images for Cardiac catheterization   Link to Procedure Log   Procedure Log    Hemo Data   Flowsheet Row Most Recent Value  Fick Cardiac Output 6.56 L/min  Fick Cardiac Output Index 3.47 (L/min)/BSA  Thermal Cardiac Output 5.86 L/min  Thermal Cardiac Output Index 3.1 (L/min)/BSA  Aortic Mean Gradient 17.6 mmHg  Aortic Peak Gradient 13 mmHg  Aortic Valve Area 1.31  Aortic Value Area Index 0.69 cm2/BSA  RA A Wave 2 mmHg  RA V Wave 5 mmHg  RA Mean 3 mmHg  RV Systolic Pressure 40 mmHg  RV Diastolic Pressure 0 mmHg  RV EDP 6 mmHg  PA Systolic Pressure 30 mmHg  PA Diastolic Pressure 12 mmHg  PA Mean 20 mmHg  PW A Wave 13 mmHg  PW V Wave 22 mmHg  PW Mean 11 mmHg  AO Systolic Pressure 139 mmHg  AO Diastolic Pressure 44 mmHg  AO Mean 72 mmHg  LV Systolic Pressure 176 mmHg  LV Diastolic Pressure 3 mmHg  LV EDP 9 mmHg  Arterial Occlusion Pressure Extended Systolic Pressure 152 mmHg  Arterial Occlusion Pressure Extended Diastolic Pressure 40 mmHg  Arterial Occlusion Pressure Extended Mean Pressure 69 mmHg  Left Ventricular Apex Extended Systolic Pressure 163 mmHg  Left Ventricular Apex Extended Diastolic Pressure 2 mmHg  Left Ventricular Apex Extended EDP  Pressure 18 mmHg  QP/QS 1  TPVR Index 6.78 HRUI  TSVR Index 27.74 HRUI  PVR SVR Ratio 0.14  TPVR/TSVR Ratio 0.24    Cardiac TAVR CT  TECHNIQUE: The patient was scanned on a Philips 256 scanner. A 120 kV retrospective scan was triggered in the descending thoracic aorta at 111 HU's. Gantry rotation speed was 270 msecs and collimation was .9 mm. No beta  blockade or nitro were given. The 3D data set was reconstructed in 5% intervals of the R-R cycle. Systolic and diastolic phases were analyzed on a dedicated work station using MPR, MIP and VRT modes. The patient received 80 cc of contrast.  FINDINGS: Aortic Valve: Severely thickened and calcified with severe asymmetric calcification under the left coronary cusp extending into the LVOT.  Aorta:  Normal size, no dissection, moderate diffuse calcifications.  Sinotubular Junction:  37 x 32 mm  Ascending Thoracic Aorta:  38 x 38 mm  Aortic Arch:  30 x 28 mm  Descending Thoracic Aorta:  28 x 26 mm  Sinus of Valsalva Measurements:  Non-coronary:  39 mm  Right -coronary:  37 mm  Left -coronary:  37 mm  Coronary Artery Height above Annulus:  Left Main:  14 mm  Right Coronary:  17 mm  Virtual Basal Annulus Measurements:  Maximum/Minimum Diameter:  30 x 26 mm  Perimeter:  86 mm  Area:  580 mm2  Optimum Fluoroscopic Angle for Delivery:  RAO 1 CRA 0  IMPRESSION: 1. Severely calcified and thickened tricuspid aortic valve with severe asymmetric calcification under the left coronary cusp extending into the LVOT and annular measurements suitable for delivery of 29 mmEdwards-SAPIEN 3 valve.  2.  Sufficient coronary to annulus distance.  3. Optimum Fluoroscopic Angle for Delivery:  RAO 1 CRA 0  4.  No thrombus in the left atrial appendage.  Tobias Alexander   Electronically Signed   By: Tobias Alexander   On: 04/15/2016 16:58   CT ANGIOGRAPHY CHEST, ABDOMEN AND PELVIS  TECHNIQUE: Multidetector CT imaging through the chest, abdomen and pelvis was performed using the standard protocol during bolus administration of intravenous contrast. Multiplanar reconstructed images and MIPs were obtained and reviewed to evaluate the vascular anatomy.  CONTRAST:  150 mL of Isovue 370.  COMPARISON:  No priors.  FINDINGS: CTA CHEST  FINDINGS  Cardiovascular: Heart size is enlarged with left ventricular dilatation. There is no significant pericardial fluid, thickening or pericardial calcification. There is aortic atherosclerosis, as well as atherosclerosis of the great vessels of the mediastinum and the coronary arteries, including calcified atherosclerotic plaque in the left main, left anterior descending, left circumflex and right coronary arteries. Status post median sternotomy for CABG, including LIMA to the LAD. Thickening calcification of the aortic valve. Calcification of the anterior leaflet of the mitral valve.  Mediastinum/Lymph Nodes: No pathologically enlarged mediastinal or hilar lymph nodes. Esophagus is unremarkable in appearance. No axillary lymphadenopathy.  Lungs/Pleura: Incomplete visualization of the lung apices. Within the visualized portions of the lungs there are no suspicious appearing pulmonary nodules or masses, there is no acute consolidative airspace disease, no pleural effusions and no pneumothorax. Areas of mild scarring are noted in the anterior aspect of the left upper lobe.  Musculoskeletal/Soft Tissues: Median sternotomy wires. There are no aggressive appearing lytic or blastic lesions noted in the visualized portions of the skeleton.  CTA ABDOMEN AND PELVIS FINDINGS  Hepatobiliary: 1.7 cm low-attenuation lesion in segment 7 of the liver with peripheral nodular enhancement, incompletely characterized, but likely  a cavernous hemangioma. Sub cm low-attenuation lesion in segment 8 too small to characterize, but likely a cyst. No intra or extrahepatic biliary ductal dilatation. Gallbladder is normal in appearance.  Pancreas: No pancreatic mass. No pancreatic ductal dilatation. No pancreatic or peripancreatic fluid or inflammatory changes.  Spleen: Unremarkable.  Adrenals/Urinary Tract: 2.1 cm simple cyst extending off the posterior aspect of the interpolar region of  the left kidney. Other sub cm low-attenuation lesions in both kidneys are too small to definitively characterize, but are favored to represent tiny cysts. There is also a 1.4 cm high attenuation (56 HU) lesion extending exophytically from the medial aspect of the upper pole of the right kidney (image 90 of series 4). Additionally, there are 2 intermediate attenuation lesions in the lower poles of the kidneys bilaterally measuring 1.7 cm on the right (image 113 of series 4) and 1.8 cm on the left (image 96 of series 4). Bilateral adrenal glands are normal in appearance. No hydroureteronephrosis. Urinary bladder is unremarkable in appearance.  Stomach/Bowel: Normal appearance of the stomach. No pathologic dilatation of small bowel or colon. Several scattered colonic diverticulae are noted, without surrounding inflammatory changes to suggest an acute diverticulitis at this time. The appendix is not confidently identified and may be surgically absent. Regardless, there are no inflammatory changes noted adjacent to the cecum to suggest the presence of an acute appendicitis at this time.  Vascular/Lymphatic: Aortic atherosclerosis, without evidence of aneurysm or dissection in the abdominal or pelvic vasculature. Vascular findings and measurements pertinent to potential TAVR procedure, as detailed below. Celiac axis, superior mesenteric artery and inferior mesenteric artery and their major branches are all widely patent, although there appears to be mild stenosis in the proximal superior mesenteric artery (less than 50% diameter stenosis). Single renal arteries bilaterally, both widely patent. No lymphadenopathy noted in the abdomen or pelvis.  Reproductive: Prostate gland and seminal vesicles are unremarkable in appearance.  Other: Epigastric ventral hernia containing only omental fat. No associated bowel incarceration or obstruction at this time. No significant volume of ascites.  No pneumoperitoneum.  Musculoskeletal: There are no aggressive appearing lytic or blastic lesions noted in the visualized portions of the skeleton.  VASCULAR MEASUREMENTS PERTINENT TO TAVR:  AORTA:  Minimal Aortic Diameter -  16 x 15 mm  Severity of Aortic Calcification -  moderate to severe  RIGHT PELVIS:  Right Common Iliac Artery -  Minimal Diameter - 9.9 x 7.3 mm  Tortuosity - mild  Calcification - moderate  Right External Iliac Artery -  Minimal Diameter - 8.9 x 4.8 mm  Tortuosity - mild  Calcification - mild  Right Common Femoral Artery -  Minimal Diameter - 7.5 x 6.2 mm  Tortuosity - mild  Calcification - moderate  LEFT PELVIS:  Left Common Iliac Artery -  Minimal Diameter - 9.9 x 7.8 mm  Tortuosity - mild  Calcification - mild  Left External Iliac Artery -  Minimal Diameter - 8.7 x 8.5 mm  Tortuosity - mild  Calcification - mild  Left Common Femoral Artery -  Minimal Diameter - 9.3 x 9.6 mm  Tortuosity - mild  Calcification - mild  Review of the MIP images confirms the above findings.  IMPRESSION: 1. Vascular findings and measurements pertinent to potential TAVR procedure, as detailed above. This patient appears to have suitable pelvic arterial access bilaterally. 2. Thickening calcification of the aortic valve, compatible with the reported clinical history of severe aortic stenosis. 3. Cardiomegaly with left ventricular dilatation. 4. Aortic  atherosclerosis, in addition to left main and 3 vessel coronary artery disease.Status post median sternotomy for CABG, including LIMA to the LAD. 5. Several indeterminate lesions in the kidneys bilaterally, favored to represent proteinaceous cysts. The lesion in the medial aspect of the upper pole of the right kidney and in the lower pole the right kidney both appear relatively stable in size compared to prior study 11/19/2011, making the likelihood of  aggressive lesions very low. By comparison, the lesion in the medial aspect of the lower pole of the left kidney is new compared to the prior examination and therefore more concerning. These are considered Bosniak class 62F lesions, and warrant follow-up evaluation with MRI of the abdomen with and without IV gadolinium in 6 months to ensure the stability of these lesions and to provide better characterization. This recommendation follows ACR consensus guidelines: Management of the Incidental Renal Mass on CT: A White Paper of the ACR Incidental Findings Committee. J Am Coll Radiol 2017; article in press. 6. Additional incidental findings, as above.   Electronically Signed   By: Trudie Reed M.D.   On: 04/15/2016 17:03   STS Risk Calculator  Procedure    AVR + redo CABG  Risk of Mortality   10.2% Morbidity or Mortality  40.4% Prolonged LOS   21.0% Short LOS    11.4% Permanent Stroke   3.9% Prolonged Vent Support  27.0% DSW Infection    0.6% Renal Failure    13.5% Reoperation    15.0%    Impression:  Patient has stage D severe symptomatic aortic stenosis and moderate aortic insufficiency. He has a long history of coronary artery disease status post coronary artery bypass grafting in the remote past. He has ischemic cardiomyopathy with left ventricular ejection fraction estimated at only 30-35%. He presents with progressive symptoms of exertional shortness of breath and fatigue consistent with chronic combined systolic and diastolic congestive heart failure, [place] Heart Association functional class IIIB.  His functional status has been progressively declining for quite some time now with his primary limitation being that of symptoms of congestive heart failure. I have personally reviewed the patient's most recent transthoracic echocardiogram and dobutamine stress echocardiogram. The patient's aortic valve is trileaflet with moderate to severe calcification and restricted  leaflet mobility.  The patient has at least moderate aortic insufficiency which unquestionably contributes to his symptoms. Peak velocity across the aortic valve measured proximally 3.2 m/s at rest but increased significantly to 3.9 m/s with dobutamine, consistent with low gradient low ejection fraction severe aortic stenosis. The patient was noted to have significant contractile reserve. Recent diagnostic cardiac catheterization demonstrates severe native coronary artery disease but continued patency of all of the bypass grafts placed at the time of coronary artery bypass surgery in 1998. Risks associated with conventional surgical aortic valve replacement would unquestionably be quite high. The patient appears quite frail and under the circumstances I would not consider him a candidate for conventional surgery with or without redo coronary artery bypass grafting. Cardiac gated CT angiogram of the heart demonstrates anatomical characteristics consistent with severe aortic stenosis and findings suitable for transcatheter aortic valve replacement without any significant complicating features. The patient does have some calcification extending into the annulus which might slightly increased risk of paravalvular leak, but overall findings appear anatomically suitable.  CT angiogram of the aorta and iliac vessels demonstrates significant atherosclerotic disease but adequate pelvic vascular access to facilitate a transfemoral approach.   Plan:  The patient and his wife were counseled  at length regarding treatment alternatives for management of severe symptomatic aortic stenosis. Alternative approaches such as conventional aortic valve replacement, transcatheter aortic valve replacement, and palliative medical therapy were compared and contrasted at length.  The risks associated with conventional surgical aortic valve replacement were been discussed in detail, as were reasons why I would not consider the patient a  candidate for conventional surgery.  Expectations for the patient's postoperative convalescence following TAVR were discussed as well as risks associated with the procedure.  Long-term prognosis with medical therapy without intervention was discussed. This discussion was placed in the context of the patient's own specific clinical presentation and past medical history.  All of their questions been addressed.  The patient is interested in proceeding with transcatheter aortic valve replacement in the near future.    Following the decision to proceed with transcatheter aortic valve replacement, a discussion has been held regarding what types of management strategies would be attempted intraoperatively in the event of life-threatening complications, including whether or not the patient would be considered a candidate for the use of cardiopulmonary bypass and/or conversion to open sternotomy for attempted surgical intervention.  The patient has been advised of a variety of complications that might develop including but not limited to risks of death, stroke, paravalvular leak, aortic dissection or other major vascular complications, aortic annulus rupture, device embolization, cardiac rupture or perforation, mitral regurgitation, acute myocardial infarction, arrhythmia, heart block or bradycardia requiring permanent pacemaker placement, congestive heart failure, respiratory failure, renal failure, pneumonia, infection, other late complications related to structural valve deterioration or migration, or other complications that might ultimately cause a temporary or permanent loss of functional independence or other long term morbidity.  The patient provides full informed consent for the procedure as described and all questions were answered.  We tentatively plan to proceed with TAVR on 04/30/2016.  The patient has been instructed to stop taking Plavix 7 days prior to surgery.   I spent in excess of 90 minutes during  the conduct of this office consultation and >50% of this time involved direct face-to-face encounter with the patient for counseling and/or coordination of their care.    Salvatore Decent. Cornelius Moras, MD 04/17/2016 6:36 PM

## 2016-04-17 NOTE — Patient Instructions (Signed)
Patient has been instructed to stop taking Plavix 7 days prior to surgery (04/23/2016)  Patient should continue taking all other medications without change through the day before surgery.  Patient should have nothing to eat or drink after midnight the night before surgery.  On the morning of surgery patient should take only Norvasc, Hydralazine and Metoprolol with a sip of water.

## 2016-04-18 ENCOUNTER — Encounter: Payer: Medicare Other | Admitting: Thoracic Surgery (Cardiothoracic Vascular Surgery)

## 2016-04-26 ENCOUNTER — Encounter (HOSPITAL_COMMUNITY)
Admission: RE | Admit: 2016-04-26 | Discharge: 2016-04-26 | Disposition: A | Discharge status: Home / Self Care | Payer: Medicare Other | Source: Ambulatory Visit | Attending: Cardiovascular Disease | Admitting: Cardiovascular Disease

## 2016-04-26 ENCOUNTER — Encounter (HOSPITAL_COMMUNITY): Payer: Self-pay

## 2016-04-26 DIAGNOSIS — I10 Essential (primary) hypertension: Secondary | ICD-10-CM | POA: Insufficient documentation

## 2016-04-26 DIAGNOSIS — E7800 Pure hypercholesterolemia, unspecified: Secondary | ICD-10-CM | POA: Insufficient documentation

## 2016-04-26 DIAGNOSIS — Z01818 Encounter for other preprocedural examination: Secondary | ICD-10-CM | POA: Insufficient documentation

## 2016-04-26 DIAGNOSIS — I447 Left bundle-branch block, unspecified: Secondary | ICD-10-CM | POA: Insufficient documentation

## 2016-04-26 DIAGNOSIS — I350 Nonrheumatic aortic (valve) stenosis: Secondary | ICD-10-CM | POA: Insufficient documentation

## 2016-04-26 DIAGNOSIS — I2510 Atherosclerotic heart disease of native coronary artery without angina pectoris: Secondary | ICD-10-CM | POA: Diagnosis not present

## 2016-04-26 DIAGNOSIS — R0602 Shortness of breath: Secondary | ICD-10-CM | POA: Insufficient documentation

## 2016-04-26 DIAGNOSIS — E119 Type 2 diabetes mellitus without complications: Secondary | ICD-10-CM | POA: Insufficient documentation

## 2016-04-26 DIAGNOSIS — K769 Liver disease, unspecified: Secondary | ICD-10-CM | POA: Insufficient documentation

## 2016-04-26 DIAGNOSIS — R600 Localized edema: Secondary | ICD-10-CM | POA: Insufficient documentation

## 2016-04-26 DIAGNOSIS — Z01812 Encounter for preprocedural laboratory examination: Secondary | ICD-10-CM | POA: Insufficient documentation

## 2016-04-26 LAB — COMPREHENSIVE METABOLIC PANEL
ALT: 19 U/L (ref 17–63)
AST: 25 U/L (ref 15–41)
Albumin: 4.1 g/dL (ref 3.5–5.0)
Alkaline Phosphatase: 53 U/L (ref 38–126)
Anion gap: 8 (ref 5–15)
BUN: 15 mg/dL (ref 6–20)
CHLORIDE: 98 mmol/L — AB (ref 101–111)
CO2: 28 mmol/L (ref 22–32)
Calcium: 9.6 mg/dL (ref 8.9–10.3)
Creatinine, Ser: 0.8 mg/dL (ref 0.61–1.24)
Glucose, Bld: 101 mg/dL — ABNORMAL HIGH (ref 65–99)
POTASSIUM: 4.2 mmol/L (ref 3.5–5.1)
SODIUM: 134 mmol/L — AB (ref 135–145)
Total Bilirubin: 0.6 mg/dL (ref 0.3–1.2)
Total Protein: 6.4 g/dL — ABNORMAL LOW (ref 6.5–8.1)

## 2016-04-26 LAB — CBC
HCT: 38.2 % — ABNORMAL LOW (ref 39.0–52.0)
Hemoglobin: 12.3 g/dL — ABNORMAL LOW (ref 13.0–17.0)
MCH: 30.5 pg (ref 26.0–34.0)
MCHC: 32.2 g/dL (ref 30.0–36.0)
MCV: 94.8 fL (ref 78.0–100.0)
PLATELETS: 175 10*3/uL (ref 150–400)
RBC: 4.03 MIL/uL — AB (ref 4.22–5.81)
RDW: 14.2 % (ref 11.5–15.5)
WBC: 6.5 10*3/uL (ref 4.0–10.5)

## 2016-04-26 LAB — URINALYSIS, ROUTINE W REFLEX MICROSCOPIC
Bilirubin Urine: NEGATIVE
Glucose, UA: NEGATIVE mg/dL
Hgb urine dipstick: NEGATIVE
KETONES UR: NEGATIVE mg/dL
LEUKOCYTES UA: NEGATIVE
NITRITE: NEGATIVE
PROTEIN: NEGATIVE mg/dL
Specific Gravity, Urine: 1.008 (ref 1.005–1.030)
pH: 7 (ref 5.0–8.0)

## 2016-04-26 LAB — PROTIME-INR
INR: 1.11
PROTHROMBIN TIME: 14.3 s (ref 11.4–15.2)

## 2016-04-26 LAB — SURGICAL PCR SCREEN
MRSA, PCR: NEGATIVE
Staphylococcus aureus: NEGATIVE

## 2016-04-26 LAB — GLUCOSE, CAPILLARY: Glucose-Capillary: 93 mg/dL (ref 65–99)

## 2016-04-26 LAB — APTT: APTT: 31 s (ref 24–36)

## 2016-04-26 LAB — ABO/RH: ABO/RH(D): A POS

## 2016-04-26 NOTE — Progress Notes (Signed)
PCP - Assunta FoundJohn [name] Cardiologist - Kelley  Chest x-ray - 04/26/16 EKG - 04/05/16 Stress Test - 03/20/16 ECHO - 03/20/16 Cardiac Cath - 04/05/16  Patient was instructed to stop plavix on 04/22/16 Aspirin is to be continued until the day of surgery.  Patient verbalized understanding that he is not take his aspiring the morning of surgery.  Fasting blood sugar is 120-130s  Ryan from Dr. Sharee PimpleBartle's office stated that patient does not need dopplers  Sending to anesthesia for review of ekg that was abnormal   Patient denies shortness of breath, fever, cough and chest pain at PAT appointment

## 2016-04-26 NOTE — Pre-Procedure Instructions (Addendum)
Kellar Westberg Archambeau  04/26/2016      [id number] - [place], [place] - [address] AT [facility] [address] [place] [address] Phone: [phone] Fax: [phone]    Your procedure is scheduled on Ocotber 24  Report to [facility] Admitting at 0530 A.M.  Call this number if you have problems the morning of surgery:  [phone]   Remember:  Do not eat food or drink liquids after midnight.   Take these medicines the morning of surgery with A SIP OF WATER amLODipine (NORVASC), cetirizine (ZYRTEC), gabapentin (NEURONTIN), hydrALAZINE (APRESOLINE),    nitroGLYCERIN (NITROSTAT) if needed  7 days prior to surgery STOP taking any Plavix, NSAIDS, Aspirin, Aleve, Naproxen, Ibuprofen, Motrin, Advil, Goody's, BC's, all herbal medications, fish oil, and all vitamins    WHAT DO I DO ABOUT MY DIABETES MEDICATION?   Marland Kitchen Do not take oral diabetes medicines (pills) the morning of surgery. METFORMIN   How to Manage Your Diabetes Before and After Surgery  Why is it important to control my blood sugar before and after surgery? . Improving blood sugar levels before and after surgery helps healing and can limit problems. . A way of improving blood sugar control is eating a healthy diet by: o  Eating less sugar and carbohydrates o  Increasing activity/exercise o  Talking with your doctor about reaching your blood sugar goals . High blood sugars (greater than 180 mg/dL) can raise your risk of infections and slow your recovery, so you will need to focus on controlling your diabetes during the weeks before surgery. . Make sure that the doctor who takes care of your diabetes knows about your planned surgery including the date and location.  How do I manage my blood sugar before surgery? . Check your blood sugar at least 4 times a day, starting 2 days before surgery, to make sure that the level is not too high or low. o Check your blood sugar the morning of your  surgery when you wake up and every 2 hours until you get to the Short Stay unit. . If your blood sugar is less than 70 mg/dL, you will need to treat for low blood sugar: o Do not take insulin. o Treat a low blood sugar (less than 70 mg/dL) with  cup of clear juice (cranberry or apple), 4 glucose tablets, OR glucose gel. o Recheck blood sugar in 15 minutes after treatment (to make sure it is greater than 70 mg/dL). If your blood sugar is not greater than 70 mg/dL on recheck, call [phone] for further instructions. . Report your blood sugar to the short stay nurse when you get to Short Stay.  . If you are admitted to the hospital after surgery: o Your blood sugar will be checked by the staff and you will probably be given insulin after surgery (instead of oral diabetes medicines) to make sure you have good blood sugar levels. o The goal for blood sugar control after surgery is 80-180 mg/dL.    Do not wear jewelry.  Do not wear lotions, powders, or cologne, or deoderant.  Men may shave face and neck.  Do not bring valuables to the hospital.  [facility] is not responsible for any belongings or valuables.  Contacts, dentures or bridgework may not be worn into surgery.  Leave your suitcase in the car.  After surgery it may be brought to your room.  For patients admitted to the hospital, discharge time will be  determined by your treatment team.  Patients discharged the day of surgery will not be allowed to drive home.    Special instructions:   [place]- Preparing For Surgery  Before surgery, you can play an important role. Because skin is not sterile, your skin needs to be as free of germs as possible. You can reduce the number of germs on your skin by washing with CHG (chlorahexidine gluconate) Soap before surgery.  CHG is an antiseptic cleaner which kills germs and bonds with the skin to continue killing germs even after washing.  Please do not use if you have an allergy to CHG or  antibacterial soaps. If your skin becomes reddened/irritated stop using the CHG.  Do not shave (including legs and underarms) for at least 48 hours prior to first CHG shower. It is OK to shave your face.  Please follow these instructions carefully.   1. Shower the NIGHT BEFORE SURGERY and the MORNING OF SURGERY with CHG.   2. If you chose to wash your hair, wash your hair first as usual with your normal shampoo.  3. After you shampoo, rinse your hair and body thoroughly to remove the shampoo.  4. Use CHG as you would any other liquid soap. You can apply CHG directly to the skin and wash gently with a scrungie or a clean washcloth.   5. Apply the CHG Soap to your body ONLY FROM THE NECK DOWN.  Do not use on open wounds or open sores. Avoid contact with your eyes, ears, mouth and genitals (private parts). Wash genitals (private parts) with your normal soap.  6. Wash thoroughly, paying special attention to the area where your surgery will be performed.  7. Thoroughly rinse your body with warm water from the neck down.  8. DO NOT shower/wash with your normal soap after using and rinsing off the CHG Soap.  9. Pat yourself dry with a CLEAN TOWEL.   10. Wear CLEAN PAJAMAS   11. Place CLEAN SHEETS on your bed the night of your first shower and DO NOT SLEEP WITH PETS.    Day of Surgery: Do not apply any deodorants/lotions. Please wear clean clothes to the hospital/surgery center.      Please read over the following fact sheets that you were given.

## 2016-04-27 LAB — HEMOGLOBIN A1C
Hgb A1c MFr Bld: 7.1 % — ABNORMAL HIGH (ref 4.8–5.6)
MEAN PLASMA GLUCOSE: 157 mg/dL

## 2016-04-29 ENCOUNTER — Ambulatory Visit: Payer: Medicare Other | Admitting: Neurology

## 2016-04-29 MED ORDER — MAGNESIUM SULFATE 50 % IJ SOLN
40.0000 meq | INTRAMUSCULAR | Status: DC
  Filled 2016-04-29: qty 10

## 2016-04-29 MED ORDER — VANCOMYCIN HCL 10 G IV SOLR
1250.0000 mg | INTRAVENOUS | Status: AC
  Administered 2016-04-30: 1250 mg via INTRAVENOUS
  Filled 2016-04-29 (×2): qty 1250

## 2016-04-29 MED ORDER — DOPAMINE-DEXTROSE 3.2-5 MG/ML-% IV SOLN: 0.0000 ug/kg/min | INTRAVENOUS | Status: DC

## 2016-04-29 MED ORDER — TRANEXAMIC ACID 1000 MG/10ML IV SOLN
1.5000 mg/kg/h | INTRAVENOUS | Status: DC
  Filled 2016-04-29: qty 25

## 2016-04-29 MED ORDER — SODIUM CHLORIDE 0.9 % IV SOLN
INTRAVENOUS | Status: DC
  Filled 2016-04-29: qty 2.5

## 2016-04-29 MED ORDER — NOREPINEPHRINE BITARTRATE 1 MG/ML IV SOLN
0.0000 ug/min | INTRAVENOUS | Status: AC
  Administered 2016-04-30: 1 ug/min via INTRAVENOUS
  Filled 2016-04-29: qty 4

## 2016-04-29 MED ORDER — TRANEXAMIC ACID (OHS) PUMP PRIME SOLUTION
2.0000 mg/kg | INTRAVENOUS | Status: DC
  Filled 2016-04-29: qty 1.59

## 2016-04-29 MED ORDER — NITROGLYCERIN IN D5W 200-5 MCG/ML-% IV SOLN
2.0000 ug/min | INTRAVENOUS | Status: DC
  Filled 2016-04-29: qty 250

## 2016-04-29 MED ORDER — SODIUM CHLORIDE 0.9 % IV SOLN
INTRAVENOUS | Status: DC
  Filled 2016-04-29: qty 30

## 2016-04-29 MED ORDER — HEPARIN SODIUM (PORCINE) 1000 UNIT/ML IJ SOLN
INTRAMUSCULAR | Status: DC
  Filled 2016-04-29: qty 30

## 2016-04-29 MED ORDER — PLASMA-LYTE 148 IV SOLN
INTRAVENOUS | Status: DC
  Filled 2016-04-29: qty 2.5

## 2016-04-29 MED ORDER — DEXTROSE 5 % IV SOLN
750.0000 mg | INTRAVENOUS | Status: DC
  Filled 2016-04-29: qty 750

## 2016-04-29 MED ORDER — DEXMEDETOMIDINE HCL IN NACL 400 MCG/100ML IV SOLN
0.1000 ug/kg/h | INTRAVENOUS | Status: DC
  Filled 2016-04-29: qty 100

## 2016-04-29 MED ORDER — TRANEXAMIC ACID (OHS) BOLUS VIA INFUSION
15.0000 mg/kg | INTRAVENOUS | Status: DC
  Filled 2016-04-29: qty 1191

## 2016-04-29 MED ORDER — DEXMEDETOMIDINE HCL IN NACL 400 MCG/100ML IV SOLN
0.1000 ug/kg/h | INTRAVENOUS | Status: AC
  Administered 2016-04-30: .5 ug/kg/h via INTRAVENOUS
  Filled 2016-04-29: qty 100

## 2016-04-29 MED ORDER — PHENYLEPHRINE HCL 10 MG/ML IJ SOLN
30.0000 ug/min | INTRAMUSCULAR | Status: DC
  Filled 2016-04-29: qty 2

## 2016-04-29 MED ORDER — SODIUM CHLORIDE 0.9 % IV SOLN: INTRAVENOUS | Status: DC

## 2016-04-29 MED ORDER — POTASSIUM CHLORIDE 2 MEQ/ML IV SOLN
80.0000 meq | INTRAVENOUS | Status: DC
  Filled 2016-04-29: qty 40

## 2016-04-29 MED ORDER — DEXTROSE 5 % IV SOLN
1.5000 g | INTRAVENOUS | Status: AC
  Administered 2016-04-30: 1.5 g via INTRAVENOUS
  Filled 2016-04-29 (×2): qty 1.5

## 2016-04-29 MED ORDER — DOPAMINE-DEXTROSE 3.2-5 MG/ML-% IV SOLN
0.0000 ug/kg/min | INTRAVENOUS | Status: DC
  Filled 2016-04-29: qty 250

## 2016-04-29 MED ORDER — DEXTROSE 5 % IV SOLN
0.0000 ug/min | INTRAVENOUS | Status: DC
  Filled 2016-04-29: qty 4

## 2016-04-29 MED ORDER — EPINEPHRINE PF 1 MG/ML IJ SOLN
0.0000 ug/min | INTRAVENOUS | Status: DC
  Filled 2016-04-29: qty 4

## 2016-04-29 MED ORDER — VANCOMYCIN HCL 10 G IV SOLR
1250.0000 mg | INTRAVENOUS | Status: DC
  Filled 2016-04-29: qty 1250

## 2016-04-29 MED ORDER — PHENYLEPHRINE HCL 10 MG/ML IJ SOLN
30.0000 ug/min | INTRAVENOUS | Status: AC
  Administered 2016-04-30: 50 ug/min via INTRAVENOUS
  Filled 2016-04-29: qty 2

## 2016-04-29 MED ORDER — DEXTROSE 5 % IV SOLN: 1.5000 g | INTRAVENOUS | Status: DC

## 2016-04-29 NOTE — Anesthesia Preprocedure Evaluation (Addendum)
Anesthesia Evaluation  Patient identified by MRN, date of birth, ID band Patient awake    Reviewed: Allergy & Precautions, NPO status , Patient's Chart, lab work & pertinent test results  History of Anesthesia Complications Negative for: history of anesthetic complications  Airway Mallampati: II  TM Distance: >3 FB Neck ROM: Full    Dental  (+) Teeth Intact, Dental Advisory Given   Pulmonary neg pulmonary ROS,    breath sounds clear to auscultation       Cardiovascular hypertension, + angina + CAD and +CHF  + dysrhythmias + Valvular Problems/Murmurs  Rhythm:Regular Rate:Normal + Systolic murmurs Study Conclusions  - Left ventricle: The cavity size was normal. Wall thickness was   increased in a pattern of moderate LVH. Systolic function was   moderately to severely reduced. The estimated ejection fraction   was in the range of 30% to 35%. Diffuse hypokinesis. The present catheterization continues to demonstrate patency to the vein graft conduits as well as the left internal mammary artery with severe native CAD.  The patient has experienced a definite decline in functional capacity and on recent echo Doppler study EF has declined to 35%.  I have recommended that he follow-up with Dr. Sanjuana KavaMcAlhaney to proceed with TAVR and additional imaging requirements prior to final recommendations.    Neuro/Psych    GI/Hepatic Neg liver ROS, hiatal hernia, GERD  ,  Endo/Other  diabetes, Well Controlled  Renal/GU negative Renal ROS     Musculoskeletal   Abdominal   Peds  Hematology negative hematology ROS (+)   Anesthesia Other Findings   Reproductive/Obstetrics                           Anesthesia Physical Anesthesia Plan  ASA: IV  Anesthesia Plan: General   Post-op Pain Management:    Induction: Intravenous  Airway Management Planned: Oral ETT  Additional Equipment: Arterial line, TEE and PA  Cath  Intra-op Plan:   Post-operative Plan: Extubation in OR and Possible Post-op intubation/ventilation  Informed Consent: I have reviewed the patients History and Physical, chart, labs and discussed the procedure including the risks, benefits and alternatives for the proposed anesthesia with the patient or authorized representative who has indicated his/her understanding and acceptance.   Dental advisory given  Plan Discussed with: CRNA and Anesthesiologist  Anesthesia Plan Comments:       Anesthesia Quick Evaluation

## 2016-04-30 ENCOUNTER — Inpatient Hospital Stay (HOSPITAL_COMMUNITY): Payer: Medicare Other | Admitting: Vascular Surgery

## 2016-04-30 ENCOUNTER — Inpatient Hospital Stay (HOSPITAL_COMMUNITY): Payer: Medicare Other

## 2016-04-30 ENCOUNTER — Encounter (HOSPITAL_COMMUNITY): Payer: Self-pay | Admitting: *Deleted

## 2016-04-30 ENCOUNTER — Inpatient Hospital Stay (HOSPITAL_COMMUNITY): Payer: Medicare Other | Admitting: Certified Registered"

## 2016-04-30 ENCOUNTER — Encounter (HOSPITAL_COMMUNITY)
Admission: RE | Disposition: A | Discharge status: Home / Self Care | Payer: Self-pay | Source: Ambulatory Visit | Attending: Cardiovascular Disease

## 2016-04-30 ENCOUNTER — Inpatient Hospital Stay (HOSPITAL_COMMUNITY)
Admission: RE | Admit: 2016-04-30 | Discharge: 2016-05-03 | DRG: 267 | Disposition: A | Discharge status: Home / Self Care | Payer: Medicare Other | Source: Ambulatory Visit | Attending: Cardiovascular Disease | Admitting: Cardiovascular Disease

## 2016-04-30 DIAGNOSIS — Z888 Allergy status to other drugs, medicaments and biological substances status: Secondary | ICD-10-CM

## 2016-04-30 DIAGNOSIS — I255 Ischemic cardiomyopathy: Secondary | ICD-10-CM | POA: Diagnosis present

## 2016-04-30 DIAGNOSIS — E876 Hypokalemia: Secondary | ICD-10-CM | POA: Diagnosis not present

## 2016-04-30 DIAGNOSIS — I272 Pulmonary hypertension, unspecified: Secondary | ICD-10-CM | POA: Diagnosis present

## 2016-04-30 DIAGNOSIS — I5042 Chronic combined systolic (congestive) and diastolic (congestive) heart failure: Secondary | ICD-10-CM | POA: Diagnosis present

## 2016-04-30 DIAGNOSIS — I2582 Chronic total occlusion of coronary artery: Secondary | ICD-10-CM | POA: Diagnosis present

## 2016-04-30 DIAGNOSIS — I350 Nonrheumatic aortic (valve) stenosis: Secondary | ICD-10-CM

## 2016-04-30 DIAGNOSIS — G2581 Restless legs syndrome: Secondary | ICD-10-CM | POA: Diagnosis present

## 2016-04-30 DIAGNOSIS — G609 Hereditary and idiopathic neuropathy, unspecified: Secondary | ICD-10-CM | POA: Diagnosis present

## 2016-04-30 DIAGNOSIS — Z006 Encounter for examination for normal comparison and control in clinical research program: Secondary | ICD-10-CM

## 2016-04-30 DIAGNOSIS — I25118 Atherosclerotic heart disease of native coronary artery with other forms of angina pectoris: Secondary | ICD-10-CM | POA: Diagnosis present

## 2016-04-30 DIAGNOSIS — H3530 Unspecified macular degeneration: Secondary | ICD-10-CM | POA: Diagnosis present

## 2016-04-30 DIAGNOSIS — Z7984 Long term (current) use of oral hypoglycemic drugs: Secondary | ICD-10-CM | POA: Diagnosis not present

## 2016-04-30 DIAGNOSIS — I480 Paroxysmal atrial fibrillation: Secondary | ICD-10-CM | POA: Diagnosis not present

## 2016-04-30 DIAGNOSIS — Z79899 Other long term (current) drug therapy: Secondary | ICD-10-CM

## 2016-04-30 DIAGNOSIS — R001 Bradycardia, unspecified: Secondary | ICD-10-CM | POA: Diagnosis present

## 2016-04-30 DIAGNOSIS — M810 Age-related osteoporosis without current pathological fracture: Secondary | ICD-10-CM | POA: Diagnosis present

## 2016-04-30 DIAGNOSIS — Z952 Presence of prosthetic heart valve: Secondary | ICD-10-CM

## 2016-04-30 DIAGNOSIS — E1151 Type 2 diabetes mellitus with diabetic peripheral angiopathy without gangrene: Secondary | ICD-10-CM | POA: Diagnosis present

## 2016-04-30 DIAGNOSIS — K219 Gastro-esophageal reflux disease without esophagitis: Secondary | ICD-10-CM | POA: Diagnosis present

## 2016-04-30 DIAGNOSIS — I447 Left bundle-branch block, unspecified: Secondary | ICD-10-CM | POA: Diagnosis present

## 2016-04-30 DIAGNOSIS — E7800 Pure hypercholesterolemia, unspecified: Secondary | ICD-10-CM | POA: Diagnosis present

## 2016-04-30 DIAGNOSIS — I9789 Other postprocedural complications and disorders of the circulatory system, not elsewhere classified: Secondary | ICD-10-CM | POA: Diagnosis not present

## 2016-04-30 DIAGNOSIS — E785 Hyperlipidemia, unspecified: Secondary | ICD-10-CM | POA: Diagnosis present

## 2016-04-30 DIAGNOSIS — I110 Hypertensive heart disease with heart failure: Secondary | ICD-10-CM | POA: Diagnosis present

## 2016-04-30 DIAGNOSIS — Z955 Presence of coronary angioplasty implant and graft: Secondary | ICD-10-CM

## 2016-04-30 DIAGNOSIS — I700 Atherosclerosis of aorta: Secondary | ICD-10-CM | POA: Diagnosis present

## 2016-04-30 DIAGNOSIS — I080 Rheumatic disorders of both mitral and aortic valves: Secondary | ICD-10-CM | POA: Diagnosis present

## 2016-04-30 DIAGNOSIS — Z7902 Long term (current) use of antithrombotics/antiplatelets: Secondary | ICD-10-CM | POA: Diagnosis not present

## 2016-04-30 DIAGNOSIS — I351 Nonrheumatic aortic (valve) insufficiency: Secondary | ICD-10-CM | POA: Diagnosis present

## 2016-04-30 DIAGNOSIS — Z8249 Family history of ischemic heart disease and other diseases of the circulatory system: Secondary | ICD-10-CM

## 2016-04-30 DIAGNOSIS — Z961 Presence of intraocular lens: Secondary | ICD-10-CM | POA: Diagnosis present

## 2016-04-30 DIAGNOSIS — I10 Essential (primary) hypertension: Secondary | ICD-10-CM | POA: Diagnosis not present

## 2016-04-30 DIAGNOSIS — Z7982 Long term (current) use of aspirin: Secondary | ICD-10-CM

## 2016-04-30 DIAGNOSIS — Z954 Presence of other heart-valve replacement: Secondary | ICD-10-CM | POA: Diagnosis not present

## 2016-04-30 HISTORY — PX: TRANSCATHETER AORTIC VALVE REPLACEMENT, TRANSFEMORAL: SHX6400

## 2016-04-30 HISTORY — PX: TEE WITHOUT CARDIOVERSION: SHX5443

## 2016-04-30 LAB — POCT I-STAT, CHEM 8
BUN: 17 mg/dL (ref 6–20)
BUN: 16 mg/dL (ref 6–20)
BUN: 17 mg/dL (ref 6–20)
CHLORIDE: 97 mmol/L — AB (ref 101–111)
Calcium, Ion: 1.2 mmol/L (ref 1.15–1.40)
Calcium, Ion: 1.16 mmol/L (ref 1.15–1.40)
Calcium, Ion: 1.16 mmol/L (ref 1.15–1.40)
Chloride: 97 mmol/L — ABNORMAL LOW (ref 101–111)
Chloride: 97 mmol/L — ABNORMAL LOW (ref 101–111)
Creatinine, Ser: 0.5 mg/dL — ABNORMAL LOW (ref 0.61–1.24)
Creatinine, Ser: 0.7 mg/dL (ref 0.61–1.24)
Creatinine, Ser: 0.7 mg/dL (ref 0.61–1.24)
Glucose, Bld: 136 mg/dL — ABNORMAL HIGH (ref 65–99)
Glucose, Bld: 134 mg/dL — ABNORMAL HIGH (ref 65–99)
Glucose, Bld: 139 mg/dL — ABNORMAL HIGH (ref 65–99)
HCT: 30 % — ABNORMAL LOW (ref 39.0–52.0)
HCT: 28 % — ABNORMAL LOW (ref 39.0–52.0)
HCT: 28 % — ABNORMAL LOW (ref 39.0–52.0)
HEMOGLOBIN: 9.5 g/dL — AB (ref 13.0–17.0)
Hemoglobin: 10.2 g/dL — ABNORMAL LOW (ref 13.0–17.0)
Hemoglobin: 9.5 g/dL — ABNORMAL LOW (ref 13.0–17.0)
POTASSIUM: 3.4 mmol/L — AB (ref 3.5–5.1)
Potassium: 3.3 mmol/L — ABNORMAL LOW (ref 3.5–5.1)
Potassium: 3.6 mmol/L (ref 3.5–5.1)
SODIUM: 137 mmol/L (ref 135–145)
Sodium: 138 mmol/L (ref 135–145)
Sodium: 137 mmol/L (ref 135–145)
TCO2: 32 mmol/L (ref 0–100)
TCO2: 28 mmol/L (ref 0–100)
TCO2: 31 mmol/L (ref 0–100)

## 2016-04-30 LAB — POCT I-STAT 4, (NA,K, GLUC, HGB,HCT)
GLUCOSE: 142 mg/dL — AB (ref 65–99)
HEMATOCRIT: 31 % — AB (ref 39.0–52.0)
Hemoglobin: 10.5 g/dL — ABNORMAL LOW (ref 13.0–17.0)
Potassium: 3.7 mmol/L (ref 3.5–5.1)
Sodium: 139 mmol/L (ref 135–145)

## 2016-04-30 LAB — CBC
HEMATOCRIT: 33.1 % — AB (ref 39.0–52.0)
Hemoglobin: 10.8 g/dL — ABNORMAL LOW (ref 13.0–17.0)
MCH: 30.3 pg (ref 26.0–34.0)
MCHC: 32.6 g/dL (ref 30.0–36.0)
MCV: 93 fL (ref 78.0–100.0)
Platelets: 106 10*3/uL — ABNORMAL LOW (ref 150–400)
RBC: 3.56 MIL/uL — ABNORMAL LOW (ref 4.22–5.81)
RDW: 14 % (ref 11.5–15.5)
WBC: 3.2 10*3/uL — AB (ref 4.0–10.5)

## 2016-04-30 LAB — GLUCOSE, CAPILLARY
Glucose-Capillary: 131 mg/dL — ABNORMAL HIGH (ref 65–99)
Glucose-Capillary: 124 mg/dL — ABNORMAL HIGH (ref 65–99)
Glucose-Capillary: 162 mg/dL — ABNORMAL HIGH (ref 65–99)

## 2016-04-30 LAB — POCT I-STAT 3, ART BLOOD GAS (G3+)
ACID-BASE EXCESS: 4 mmol/L — AB (ref 0.0–2.0)
Acid-Base Excess: 6 mmol/L — ABNORMAL HIGH (ref 0.0–2.0)
Acid-Base Excess: 4 mmol/L — ABNORMAL HIGH (ref 0.0–2.0)
Bicarbonate: 31.2 mmol/L — ABNORMAL HIGH (ref 20.0–28.0)
Bicarbonate: 26.7 mmol/L (ref 20.0–28.0)
Bicarbonate: 29.3 mmol/L — ABNORMAL HIGH (ref 20.0–28.0)
O2 SAT: 95 %
O2 Saturation: 100 %
O2 Saturation: 100 %
PCO2 ART: 47 mmHg (ref 32.0–48.0)
PCO2 ART: 32.4 mmHg (ref 32.0–48.0)
PCO2 ART: 43.7 mmHg (ref 32.0–48.0)
PH ART: 7.431 (ref 7.350–7.450)
PH ART: 7.524 — AB (ref 7.350–7.450)
Patient temperature: 98.2
TCO2: 33 mmol/L (ref 0–100)
TCO2: 28 mmol/L (ref 0–100)
TCO2: 31 mmol/L (ref 0–100)
pH, Arterial: 7.434 (ref 7.350–7.450)
pO2, Arterial: 432 mmHg — ABNORMAL HIGH (ref 83.0–108.0)
pO2, Arterial: 178 mmHg — ABNORMAL HIGH (ref 83.0–108.0)
pO2, Arterial: 71 mmHg — ABNORMAL LOW (ref 83.0–108.0)

## 2016-04-30 LAB — PROTIME-INR
INR: 1.25
Prothrombin Time: 15.7 seconds — ABNORMAL HIGH (ref 11.4–15.2)

## 2016-04-30 LAB — APTT: APTT: 37 s — AB (ref 24–36)

## 2016-04-30 LAB — PREPARE RBC (CROSSMATCH)

## 2016-04-30 SURGERY — IMPLANTATION, AORTIC VALVE, TRANSCATHETER, FEMORAL APPROACH
Anesthesia: General

## 2016-04-30 MED ORDER — NITROGLYCERIN IN D5W 200-5 MCG/ML-% IV SOLN
0.0000 ug/min | INTRAVENOUS | Status: DC
  Administered 2016-04-30: 5 ug/min via INTRAVENOUS

## 2016-04-30 MED ORDER — METOPROLOL TARTRATE 25 MG/10 ML ORAL SUSPENSION: 12.5000 mg | Freq: Two times a day (BID) | ORAL | Status: DC

## 2016-04-30 MED ORDER — LACTATED RINGERS IV SOLN
INTRAVENOUS | Status: DC | PRN
  Administered 2016-04-30: 07:00:00 via INTRAVENOUS

## 2016-04-30 MED ORDER — PROTAMINE SULFATE 10 MG/ML IV SOLN
INTRAVENOUS | Status: AC
  Filled 2016-04-30: qty 25

## 2016-04-30 MED ORDER — METOPROLOL TARTRATE 12.5 MG HALF TABLET: 12.5000 mg | ORAL_TABLET | Freq: Two times a day (BID) | ORAL | Status: DC

## 2016-04-30 MED ORDER — FENTANYL CITRATE (PF) 250 MCG/5ML IJ SOLN
INTRAMUSCULAR | Status: DC | PRN
  Administered 2016-04-30: 100 ug via INTRAVENOUS

## 2016-04-30 MED ORDER — ROCURONIUM BROMIDE 10 MG/ML (PF) SYRINGE
PREFILLED_SYRINGE | INTRAVENOUS | Status: DC | PRN
  Administered 2016-04-30: 50 mg via INTRAVENOUS

## 2016-04-30 MED ORDER — MORPHINE SULFATE (PF) 2 MG/ML IV SOLN: 1.0000 mg | INTRAVENOUS | Status: AC | PRN

## 2016-04-30 MED ORDER — CHLORHEXIDINE GLUCONATE 0.12 % MT SOLN
15.0000 mL | OROMUCOSAL | Status: AC
  Administered 2016-04-30: 15 mL via OROMUCOSAL
  Filled 2016-04-30: qty 15

## 2016-04-30 MED ORDER — HEPARIN SODIUM (PORCINE) 1000 UNIT/ML IJ SOLN
INTRAMUSCULAR | Status: DC | PRN
  Administered 2016-04-30: 14000 [IU] via INTRAVENOUS

## 2016-04-30 MED ORDER — ACETAMINOPHEN 160 MG/5ML PO SOLN: 1000.0000 mg | Freq: Four times a day (QID) | ORAL | Status: DC

## 2016-04-30 MED ORDER — TORSEMIDE 20 MG PO TABS
20.0000 mg | ORAL_TABLET | Freq: Every day | ORAL | Status: DC
  Administered 2016-04-30 – 2016-05-01 (×2): 20 mg via ORAL
  Filled 2016-04-30 (×2): qty 1

## 2016-04-30 MED ORDER — PROPOFOL 10 MG/ML IV BOLUS
INTRAVENOUS | Status: DC | PRN
  Administered 2016-04-30: 110 mg via INTRAVENOUS

## 2016-04-30 MED ORDER — PHENYLEPHRINE HCL 10 MG/ML IJ SOLN
0.0000 ug/min | INTRAVENOUS | Status: DC
  Filled 2016-04-30: qty 2

## 2016-04-30 MED ORDER — PANTOPRAZOLE SODIUM 40 MG PO TBEC
40.0000 mg | DELAYED_RELEASE_TABLET | Freq: Every day | ORAL | Status: DC
  Administered 2016-05-02 – 2016-05-03 (×2): 40 mg via ORAL
  Filled 2016-04-30 (×2): qty 1

## 2016-04-30 MED ORDER — ROCURONIUM BROMIDE 10 MG/ML (PF) SYRINGE
PREFILLED_SYRINGE | INTRAVENOUS | Status: AC
  Filled 2016-04-30: qty 10

## 2016-04-30 MED ORDER — HEPARIN SODIUM (PORCINE) 5000 UNIT/ML IJ SOLN
INTRAMUSCULAR | Status: DC | PRN
  Administered 2016-04-30: 1500 mL

## 2016-04-30 MED ORDER — ONDANSETRON HCL 4 MG/2ML IJ SOLN
INTRAMUSCULAR | Status: AC
  Filled 2016-04-30: qty 2

## 2016-04-30 MED ORDER — ALBUMIN HUMAN 5 % IV SOLN
250.0000 mL | INTRAVENOUS | Status: DC | PRN
  Filled 2016-04-30: qty 250

## 2016-04-30 MED ORDER — SODIUM CHLORIDE 0.9 % IV SOLN: Freq: Once | INTRAVENOUS | Status: AC

## 2016-04-30 MED ORDER — ASPIRIN EC 81 MG PO TBEC
81.0000 mg | DELAYED_RELEASE_TABLET | Freq: Every evening | ORAL | Status: DC
  Administered 2016-05-01 – 2016-05-02 (×2): 81 mg via ORAL
  Filled 2016-04-30 (×2): qty 1

## 2016-04-30 MED ORDER — IODIXANOL 320 MG/ML IV SOLN
INTRAVENOUS | Status: DC | PRN
  Administered 2016-04-30: 94.9 mL via INTRAVENOUS

## 2016-04-30 MED ORDER — DEXMEDETOMIDINE HCL IN NACL 200 MCG/50ML IV SOLN
0.1000 ug/kg/h | INTRAVENOUS | Status: DC
  Filled 2016-04-30: qty 50

## 2016-04-30 MED ORDER — ACETAMINOPHEN 650 MG RE SUPP: 650.0000 mg | Freq: Once | RECTAL | Status: AC

## 2016-04-30 MED ORDER — ACETAMINOPHEN 160 MG/5ML PO SOLN
650.0000 mg | Freq: Once | ORAL | Status: AC
  Administered 2016-04-30: 650 mg
  Filled 2016-04-30: qty 20.3

## 2016-04-30 MED ORDER — HYDRALAZINE HCL 10 MG PO TABS
20.0000 mg | ORAL_TABLET | Freq: Three times a day (TID) | ORAL | Status: DC
  Administered 2016-04-30 – 2016-05-03 (×9): 20 mg via ORAL
  Filled 2016-04-30 (×9): qty 2

## 2016-04-30 MED ORDER — PROTAMINE SULFATE 10 MG/ML IV SOLN
INTRAVENOUS | Status: DC | PRN
  Administered 2016-04-30 (×4): 30 mg via INTRAVENOUS
  Administered 2016-04-30: 20 mg via INTRAVENOUS

## 2016-04-30 MED ORDER — SODIUM CHLORIDE 0.9 % IV SOLN
INTRAVENOUS | Status: AC
  Administered 2016-04-30: 11:00:00 via INTRAVENOUS

## 2016-04-30 MED ORDER — MIDAZOLAM HCL 2 MG/2ML IJ SOLN: 2.0000 mg | INTRAMUSCULAR | Status: DC | PRN

## 2016-04-30 MED ORDER — PROPOFOL 10 MG/ML IV BOLUS
INTRAVENOUS | Status: AC
Start: 2016-04-30 — End: 2016-04-30
  Filled 2016-04-30: qty 20

## 2016-04-30 MED ORDER — ACETAMINOPHEN 500 MG PO TABS
1000.0000 mg | ORAL_TABLET | Freq: Four times a day (QID) | ORAL | Status: DC
  Administered 2016-05-01 – 2016-05-03 (×7): 1000 mg via ORAL
  Filled 2016-04-30 (×7): qty 2

## 2016-04-30 MED ORDER — ONDANSETRON HCL 4 MG/2ML IJ SOLN: 4.0000 mg | Freq: Four times a day (QID) | INTRAMUSCULAR | Status: DC | PRN

## 2016-04-30 MED ORDER — AMLODIPINE BESYLATE 5 MG PO TABS: 5.0000 mg | ORAL_TABLET | Freq: Every day | ORAL | Status: DC

## 2016-04-30 MED ORDER — CHLORHEXIDINE GLUCONATE 0.12 % MT SOLN
15.0000 mL | Freq: Once | OROMUCOSAL | Status: AC
  Administered 2016-04-30: 15 mL via OROMUCOSAL
  Filled 2016-04-30: qty 15

## 2016-04-30 MED ORDER — FAMOTIDINE IN NACL 20-0.9 MG/50ML-% IV SOLN
20.0000 mg | Freq: Two times a day (BID) | INTRAVENOUS | Status: AC
Start: 2016-04-30 — End: 2016-05-01
  Administered 2016-04-30: 20 mg via INTRAVENOUS
  Filled 2016-04-30: qty 50

## 2016-04-30 MED ORDER — TRAMADOL HCL 50 MG PO TABS: 50.0000 mg | ORAL_TABLET | ORAL | Status: DC | PRN

## 2016-04-30 MED ORDER — LACTATED RINGERS IV SOLN: 500.0000 mL | Freq: Once | INTRAVENOUS | Status: DC | PRN

## 2016-04-30 MED ORDER — INSULIN REGULAR BOLUS VIA INFUSION
0.0000 [IU] | Freq: Three times a day (TID) | INTRAVENOUS | Status: DC
  Filled 2016-04-30: qty 10

## 2016-04-30 MED ORDER — LIDOCAINE 2% (20 MG/ML) 5 ML SYRINGE
INTRAMUSCULAR | Status: DC | PRN
  Administered 2016-04-30: 100 mg via INTRAVENOUS

## 2016-04-30 MED ORDER — VANCOMYCIN HCL IN DEXTROSE 1-5 GM/200ML-% IV SOLN
1000.0000 mg | Freq: Once | INTRAVENOUS | Status: AC
  Administered 2016-04-30: 1000 mg via INTRAVENOUS
  Filled 2016-04-30: qty 200

## 2016-04-30 MED ORDER — HEPARIN SODIUM (PORCINE) 1000 UNIT/ML IJ SOLN
INTRAMUSCULAR | Status: AC
  Filled 2016-04-30: qty 1

## 2016-04-30 MED ORDER — CLOPIDOGREL BISULFATE 75 MG PO TABS
75.0000 mg | ORAL_TABLET | Freq: Every day | ORAL | Status: DC
  Administered 2016-04-30 – 2016-05-03 (×4): 75 mg via ORAL
  Filled 2016-04-30 (×4): qty 1

## 2016-04-30 MED ORDER — SODIUM CHLORIDE 0.9 % IV SOLN
INTRAVENOUS | Status: DC
  Filled 2016-04-30: qty 2.5

## 2016-04-30 MED ORDER — CHLORHEXIDINE GLUCONATE CLOTH 2 % EX PADS: 6.0000 | MEDICATED_PAD | Freq: Once | CUTANEOUS | Status: DC

## 2016-04-30 MED ORDER — 0.9 % SODIUM CHLORIDE (POUR BTL) OPTIME
TOPICAL | Status: DC | PRN
  Administered 2016-04-30: 1000 mL

## 2016-04-30 MED ORDER — ATORVASTATIN CALCIUM 40 MG PO TABS
40.0000 mg | ORAL_TABLET | Freq: Every day | ORAL | Status: DC
  Administered 2016-04-30 – 2016-05-02 (×3): 40 mg via ORAL
  Filled 2016-04-30 (×3): qty 1

## 2016-04-30 MED ORDER — EPINEPHRINE PF 1 MG/10ML IJ SOSY
PREFILLED_SYRINGE | INTRAMUSCULAR | Status: AC
  Filled 2016-04-30: qty 10

## 2016-04-30 MED ORDER — SUGAMMADEX SODIUM 200 MG/2ML IV SOLN
INTRAVENOUS | Status: DC | PRN
  Administered 2016-04-30: 160 mg via INTRAVENOUS

## 2016-04-30 MED ORDER — LIDOCAINE 2% (20 MG/ML) 5 ML SYRINGE
INTRAMUSCULAR | Status: AC
  Filled 2016-04-30: qty 5

## 2016-04-30 MED ORDER — FENTANYL CITRATE (PF) 250 MCG/5ML IJ SOLN
INTRAMUSCULAR | Status: AC
  Filled 2016-04-30: qty 5

## 2016-04-30 MED ORDER — GABAPENTIN 300 MG PO CAPS
300.0000 mg | ORAL_CAPSULE | Freq: Every day | ORAL | Status: DC
Start: 2016-04-30 — End: 2016-05-03
  Administered 2016-04-30 – 2016-05-02 (×3): 300 mg via ORAL
  Filled 2016-04-30 (×3): qty 1

## 2016-04-30 MED ORDER — SUGAMMADEX SODIUM 200 MG/2ML IV SOLN
INTRAVENOUS | Status: AC
  Filled 2016-04-30: qty 2

## 2016-04-30 MED ORDER — ONDANSETRON HCL 4 MG/2ML IJ SOLN
INTRAMUSCULAR | Status: DC | PRN
  Administered 2016-04-30: 4 mg via INTRAVENOUS

## 2016-04-30 MED ORDER — CEFUROXIME SODIUM 1.5 G IJ SOLR
1.5000 g | Freq: Two times a day (BID) | INTRAMUSCULAR | Status: AC
  Administered 2016-04-30 – 2016-05-02 (×4): 1.5 g via INTRAVENOUS
  Filled 2016-04-30 (×5): qty 1.5

## 2016-04-30 MED ORDER — POTASSIUM CHLORIDE 10 MEQ/50ML IV SOLN
10.0000 meq | INTRAVENOUS | Status: AC
  Administered 2016-04-30 (×3): 10 meq via INTRAVENOUS
  Filled 2016-04-30: qty 50

## 2016-04-30 MED ORDER — METOPROLOL TARTRATE 5 MG/5ML IV SOLN: 2.5000 mg | INTRAVENOUS | Status: DC | PRN

## 2016-04-30 MED ORDER — MORPHINE SULFATE (PF) 2 MG/ML IV SOLN: 2.0000 mg | INTRAVENOUS | Status: DC | PRN

## 2016-04-30 MED ORDER — INSULIN ASPART 100 UNIT/ML ~~LOC~~ SOLN
0.0000 [IU] | SUBCUTANEOUS | Status: DC
  Administered 2016-04-30 (×2): 2 [IU] via SUBCUTANEOUS
  Administered 2016-04-30: 4 [IU] via SUBCUTANEOUS
  Administered 2016-05-01 (×2): 2 [IU] via SUBCUTANEOUS
  Administered 2016-05-01: 4 [IU] via SUBCUTANEOUS
  Administered 2016-05-01 – 2016-05-02 (×3): 2 [IU] via SUBCUTANEOUS

## 2016-04-30 MED ORDER — OXYCODONE HCL 5 MG PO TABS
5.0000 mg | ORAL_TABLET | ORAL | Status: DC | PRN
  Administered 2016-04-30: 5 mg via ORAL
  Filled 2016-04-30: qty 1

## 2016-04-30 MED ORDER — CHLORHEXIDINE GLUCONATE 4 % EX LIQD
30.0000 mL | CUTANEOUS | Status: DC
Start: 2016-04-30 — End: 2016-04-30

## 2016-04-30 MED FILL — Potassium Chloride Inj 2 mEq/ML: INTRAVENOUS | Qty: 40 | Status: AC

## 2016-04-30 MED FILL — Heparin Sodium (Porcine) Inj 1000 Unit/ML: INTRAMUSCULAR | Qty: 30 | Status: AC

## 2016-04-30 MED FILL — Magnesium Sulfate Inj 50%: INTRAMUSCULAR | Qty: 10 | Status: AC

## 2016-04-30 MED FILL — Electrolyte-R (PH 7.4) Solution: INTRAVENOUS | Qty: 3000 | Status: AC

## 2016-04-30 SURGICAL SUPPLY — 101 items
ADAPTER UNIV SWAN GANZ BIP (ADAPTER) ×1 IMPLANT
ADAPTER UNV SWAN GANZ BIP (ADAPTER) ×2
ADH SKN CLS APL DERMABOND .7 (GAUZE/BANDAGES/DRESSINGS) ×1
ADPR CATH UNV NS SG CATH (ADAPTER) ×1
BAG BANDED W/RUBBER/TAPE 36X54 (MISCELLANEOUS) ×3 IMPLANT
BAG DECANTER FOR FLEXI CONT (MISCELLANEOUS) IMPLANT
BAG EQP BAND 135X91 W/RBR TAPE (MISCELLANEOUS) ×1
BAG SNAP BAND KOVER 36X36 (MISCELLANEOUS) ×6 IMPLANT
BLADE OSCILLATING /SAGITTAL (BLADE) IMPLANT
BLADE STERNUM SYSTEM 6 (BLADE) ×1 IMPLANT
BLADE SURG ROTATE 9660 (MISCELLANEOUS) ×2 IMPLANT
CABLE PACING FASLOC BIEGE (MISCELLANEOUS) ×3 IMPLANT
CABLE PACING FASLOC BLUE (MISCELLANEOUS) ×1 IMPLANT
CANNULA FEM VENOUS REMOTE 22FR (CANNULA) IMPLANT
CANNULA OPTISITE PERFUSION 16F (CANNULA) IMPLANT
CANNULA OPTISITE PERFUSION 18F (CANNULA) IMPLANT
CATH DIAG EXPO 6F VENT PIG 145 (CATHETERS) ×6 IMPLANT
CATH EXPO 5FR AL1 (CATHETERS) ×5 IMPLANT
CATH S G BIP PACING (SET/KITS/TRAYS/PACK) ×6 IMPLANT
CLIP TI MEDIUM 24 (CLIP) ×1 IMPLANT
CLIP TI WIDE RED SMALL 24 (CLIP) ×1 IMPLANT
CONT SPEC 4OZ CLIKSEAL STRL BL (MISCELLANEOUS) ×18 IMPLANT
COVER BACK TABLE 24X17X13 BIG (DRAPES) ×3 IMPLANT
COVER DOME SNAP 22 D (MISCELLANEOUS) ×3 IMPLANT
COVER MAYO STAND STRL (DRAPES) ×3 IMPLANT
COVER TABLE BACK 60X90 (DRAPES) ×3 IMPLANT
CRADLE DONUT ADULT HEAD (MISCELLANEOUS) ×3 IMPLANT
DERMABOND ADVANCED (GAUZE/BANDAGES/DRESSINGS) ×2
DERMABOND ADVANCED .7 DNX12 (GAUZE/BANDAGES/DRESSINGS) ×1 IMPLANT
DEVICE CLOSURE PERCLS PRGLD 6F (VASCULAR PRODUCTS) IMPLANT
DRAPE INCISE IOBAN 66X45 STRL (DRAPES) IMPLANT
DRAPE SLUSH MACHINE 52X66 (DRAPES) ×1 IMPLANT
DRAPE SLUSH/WARMER DISC (DRAPES) ×2 IMPLANT
DRAPE TABLE COVER HEAVY DUTY (DRAPES) ×1 IMPLANT
DRSG TEGADERM 4X4.75 (GAUZE/BANDAGES/DRESSINGS) ×3 IMPLANT
ELECT REM PT RETURN 9FT ADLT (ELECTROSURGICAL) ×6
ELECTRODE REM PT RTRN 9FT ADLT (ELECTROSURGICAL) ×2 IMPLANT
FELT TEFLON 6X6 (MISCELLANEOUS) ×1 IMPLANT
FEMORAL VENOUS CANN RAP (CANNULA) IMPLANT
GAUZE SPONGE 4X4 12PLY STRL (GAUZE/BANDAGES/DRESSINGS) ×3 IMPLANT
GLOVE BIO SURGEON STRL SZ8 (GLOVE) ×2 IMPLANT
GLOVE BIOGEL PI IND STRL 6.5 (GLOVE) IMPLANT
GLOVE BIOGEL PI IND STRL 7.5 (GLOVE) IMPLANT
GLOVE BIOGEL PI INDICATOR 6.5 (GLOVE) ×12
GLOVE BIOGEL PI INDICATOR 7.5 (GLOVE) ×4
GLOVE EUDERMIC 7 POWDERFREE (GLOVE) ×4 IMPLANT
GLOVE ORTHO TXT STRL SZ7.5 (GLOVE) ×2 IMPLANT
GOWN STRL REUS W/ TWL LRG LVL3 (GOWN DISPOSABLE) ×3 IMPLANT
GOWN STRL REUS W/ TWL XL LVL3 (GOWN DISPOSABLE) ×6 IMPLANT
GOWN STRL REUS W/TWL LRG LVL3 (GOWN DISPOSABLE) ×9
GOWN STRL REUS W/TWL XL LVL3 (GOWN DISPOSABLE) ×18
GUIDEWIRE SAF TJ AMPL .035X180 (WIRE) ×3 IMPLANT
GUIDEWIRE SAFE TJ AMPLATZ EXST (WIRE) ×3 IMPLANT
GUIDEWIRE STRAIGHT .035 260CM (WIRE) ×3 IMPLANT
INSERT FOGARTY 61MM (MISCELLANEOUS) ×1 IMPLANT
INSERT FOGARTY SM (MISCELLANEOUS) ×2 IMPLANT
INSERT FOGARTY XLG (MISCELLANEOUS) IMPLANT
KIT BASIN OR (CUSTOM PROCEDURE TRAY) ×3 IMPLANT
KIT DILATOR VASC 18G NDL (KITS) IMPLANT
KIT HEART LEFT (KITS) ×3 IMPLANT
KIT ROOM TURNOVER OR (KITS) ×3 IMPLANT
KIT SUCTION CATH 14FR (SUCTIONS) ×2 IMPLANT
NDL PERC 18GX7CM (NEEDLE) ×1 IMPLANT
NEEDLE PERC 18GX7CM (NEEDLE) ×3 IMPLANT
NS IRRIG 1000ML POUR BTL (IV SOLUTION) ×13 IMPLANT
PACK AORTA (CUSTOM PROCEDURE TRAY) ×3 IMPLANT
PAD ARMBOARD 7.5X6 YLW CONV (MISCELLANEOUS) ×6 IMPLANT
PAD ELECT DEFIB RADIOL ZOLL (MISCELLANEOUS) ×3 IMPLANT
PERCLOSE PROGLIDE 6F (VASCULAR PRODUCTS) ×9
SET MICROPUNCTURE 5F STIFF (MISCELLANEOUS) ×3 IMPLANT
SHEATH AVANTI 11CM 8FR (MISCELLANEOUS) ×3 IMPLANT
SHEATH PINNACLE 6F 10CM (SHEATH) ×6 IMPLANT
SLEEVE REPOSITIONING LENGTH 30 (MISCELLANEOUS) ×3 IMPLANT
SPONGE LAP 4X18 X RAY DECT (DISPOSABLE) ×1 IMPLANT
STOPCOCK MORSE 400PSI 3WAY (MISCELLANEOUS) ×20 IMPLANT
SUT ETHIBOND X763 2 0 SH 1 (SUTURE) ×1 IMPLANT
SUT GORETEX CV 4 TH 22 36 (SUTURE) ×1 IMPLANT
SUT GORETEX CV4 TH-18 (SUTURE) ×3 IMPLANT
SUT GORETEX TH-18 36 INCH (SUTURE) ×2 IMPLANT
SUT PROLENE 3 0 SH1 36 (SUTURE) IMPLANT
SUT PROLENE 4 0 RB 1 (SUTURE)
SUT PROLENE 4-0 RB1 .5 CRCL 36 (SUTURE) ×1 IMPLANT
SUT PROLENE 5 0 C 1 36 (SUTURE) ×2 IMPLANT
SUT PROLENE 6 0 C 1 30 (SUTURE) ×2 IMPLANT
SUT SILK  1 MH (SUTURE) ×2
SUT SILK 1 MH (SUTURE) ×1 IMPLANT
SUT SILK 2 0 SH CR/8 (SUTURE) IMPLANT
SUT VIC AB 2-0 CT1 27 (SUTURE)
SUT VIC AB 2-0 CT1 TAPERPNT 27 (SUTURE) ×1 IMPLANT
SUT VIC AB 2-0 CTX 36 (SUTURE) IMPLANT
SUT VIC AB 3-0 SH 8-18 (SUTURE) ×2 IMPLANT
SYR 30ML LL (SYRINGE) ×6 IMPLANT
SYR 50ML LL SCALE MARK (SYRINGE) ×7 IMPLANT
SYRINGE 10CC LL (SYRINGE) ×9 IMPLANT
TOWEL OR 17X26 10 PK STRL BLUE (TOWEL DISPOSABLE) ×6 IMPLANT
TRANSDUCER W/STOPCOCK (MISCELLANEOUS) ×6 IMPLANT
TRAY FOLEY IC TEMP SENS 16FR (CATHETERS) ×3 IMPLANT
TUBING HIGH PRESSURE 120CM (CONNECTOR) ×5 IMPLANT
VALVE HEART TRANSCATH SZ3 29MM (Prosthesis & Implant Heart) ×2 IMPLANT
WIRE AMPLATZ SS-J .035X180CM (WIRE) ×3 IMPLANT
WIRE BENTSON .035X145CM (WIRE) ×3 IMPLANT

## 2016-04-30 NOTE — Anesthesia Procedure Notes (Signed)
Procedure Name: Intubation Date/Time: 04/30/2016 7:51 AM Performed by: De NurseENNIE, Shauntay Brunelli E Pre-anesthesia Checklist: Patient identified, Emergency Drugs available, Suction available and Patient being monitored Patient Re-evaluated:Patient Re-evaluated prior to inductionOxygen Delivery Method: Circle System Utilized Preoxygenation: Pre-oxygenation with 100% oxygen Intubation Type: IV induction Ventilation: Mask ventilation without difficulty Laryngoscope Size: Mac and 4 Grade View: Grade I Tube type: Oral Tube size: 8.0 mm Number of attempts: 1 Airway Equipment and Method: Stylet and Oral airway Placement Confirmation: ETT inserted through vocal cords under direct vision,  positive ETCO2 and breath sounds checked- equal and bilateral Secured at: 22 cm Tube secured with: Tape Dental Injury: Teeth and Oropharynx as per pre-operative assessment

## 2016-04-30 NOTE — Op Note (Signed)
[facility]   MULTIDISCIPLINARY HEART VALVE TEAM   TAVR OPERATIVE NOTE   Date of Procedure:  04/30/2016  Preoperative Diagnosis: Severe Aortic Stenosis   Postoperative Diagnosis: Same   Procedure:    Transcatheter Aortic Valve Replacement - Percutaneous Right Transfemoral Approach  Edwards Sapien 3 THV (size 29 mm, model # 9600TFX, serial # [id number])   Co-Surgeons:  Alleen Borne, MD and Verne Carrow, MD     Anesthesiologist:  Dr. Krista Blue  Echocardiographer:  Dr. Eden Emms  Pre-operative Echo Findings:   severe aortic stenosis   moderate left ventricular systolic dysfunction   Moderate AI  Post-operative Echo Findings:  no paravalvular leak  Moderate left ventricular systolic dysfunction    BRIEF CLINICAL NOTE AND INDICATIONS FOR SURGERY  The patient is an 82 year old gentleman with a history of hypertension, hyperlipidemia, diabetes, PVD and coronary artery disease, s/p CABG x 5 in 1998 by Dr. Dorris Fetch. He has been followed by Dr. Tresa Endo. In March 2016 he presented with worsening fatigue and shortness of breath and cath showed a high grade stenosis in the SVG to the OM which was treated with a DES. He has know ischemic cardiomyopathy with an LVEF of 30-35% by echo in 11/2015. He was also noted to have moderate AS at that time with a mean gradient of 22 mm Hg and an AVA of 1.1 cm2. He recently presented with progressively worsening shortness of breath and fatigue as well as some chest pressure over the past 3-4 months. He says that he is not doing as much activity as he had been due to getting symptoms with mild exertion. He lives at home with his wife and can do easy things around the house and gets out to the grocery store since his wife can't do that. He had a dobutamine stress test on 03/20/2016 and at high dose the peak velocity went up to 3.86 with a mean gradient of 31 mm Hg and an AVA of 1.04 cm2. His SV increased by 22% with dobutamine. He  underwent cath on 04/05/2016 showing severe native multi-vessel CAD with patent grafts and no areas of significant ischemia. There was 3+ AI. Right heart pressures were normal and CO was 5.9 L/min. The AV mean gradient was measured at 17.6 mm Hg.  This 82 year old gentleman has stage D severe, symptomatic aortic stenosis with NYHA class III symptoms of exertional fatigue and shortness of breath with some chest discomfort and dizziness with position changes. I have personally reviewed and interpreted his echo, dobutamine stress echo, and cath He has a severely calcified trileaflet aortic valve with restricted mobility. His mean AV gradient on his dobutamine stress was 25 mm Hg, increasing to 31 mm Hg at high dose. He had a significant contractile reserve. His peak velocity ratio was 0.23 and indexed valve area 0.54 cm2/m2, consistent with severe AS. He also has 3+ AI which is likely contributing to his symptoms. His cardiac cath shows patent grafts with no significant areas of coronary ischemia. His LVEF is moderately depressed at 30-35% and I think that the combination of depressed EF with severe AS and moderate AI is responsible for his symptoms. I think AVR is indicated in this patient. The patient and his wife were counseled at length regarding treatment alternatives for management of severe symptomatic aortic stenosis. Alternative approaches such as conventional aortic valve replacement, transcatheter aortic valve replacement, and palliative medical therapy were compared and contrasted at length. The risks associated with conventional surgical  aortic valve replacement were been discussed in detail, as were expectations for post-operative convalescence. Long-term prognosis with medical therapy was discussed. I think he would be at high risk for open surgical AVR at his advanced age with prior CABG. TAVR is a much better option for him. His gated cardiac CT shows a severely calcified and thickened trileaflet  aortic valve with severe asymmetric calcification under the left coronary cusp extending into the LVOT that could increase his risk of a paravalvular leak but the annular measurement is suitable for a Sapien 3 valve. His abdominal and pelvic CTA shows adequate pelvic vasculature for transfemoral access.  Following the decision to proceed with transcatheter aortic valve replacement, a discussion has been held regarding what types of management strategies would be attempted intraoperatively in the event of life-threatening complications, including whether or not the patient would be considered a candidate for the use of cardiopulmonary bypass and/or conversion to open sternotomy for attempted surgical intervention.  The patient has been advised of a variety of complications that might develop peculiar to this approach including but not limited to risks of death, stroke, paravalvular leak, aortic dissection or other major vascular complications, aortic annulus rupture, device embolization, cardiac rupture or perforation, acute myocardial infarction, arrhythmia, heart block or bradycardia requiring permanent pacemaker placement, congestive heart failure, respiratory failure, renal failure, pneumonia, infection, other late complications related to structural valve deterioration or migration, or other complications that might ultimately cause a temporary or permanent loss of functional independence or other long term morbidity.  The patient provides full informed consent for the procedure as described and all questions were answered preoperatively.    DETAILS OF THE OPERATIVE PROCEDURE  PREPARATION:    The patient is brought to the operating room on the above mentioned date and central monitoring was established by the anesthesia team including placement of Swan-Ganz catheter and radial arterial line. The patient is placed in the supine position on the operating table.  Intravenous antibiotics are administered.   General endotracheal anesthesia is induced uneventfully.  A Foley catheter is placed.  Baseline transesophageal echocardiogram was performed. The patient's chest, abdomen, both groins, and both lower extremities are prepared and draped in a sterile manner. A time out procedure is performed.   Peripheral and transfemoral access were performed by Dr. Clifton James and will be dictated in his note  BALLOON AORTIC VALVULOPLASTY:   Not performed   TRANSCATHETER HEART VALVE DEPLOYMENT:   An Edwards Sapien 3 transcatheter heart valve (size 29 mm, model #9600TFX, serial #[id number]) was prepared and crimped per manufacturer's guidelines, and the proper orientation of the valve is confirmed on the [employer] delivery system. The valve was advanced through the introducer sheath using normal technique until in an appropriate position in the abdominal aorta beyond the sheath tip. The balloon was then retracted and using the fine-tuning wheel was centered on the valve. The valve was then advanced across the aortic arch using appropriate flexion of the catheter. The valve was carefully positioned across the aortic valve annulus. The Commander catheter was retracted using normal technique. Once final position of the valve has been confirmed by angiographic assessment, the valve is deployed while temporarily holding ventilation and during rapid ventricular pacing to maintain systolic blood pressure < 50 mmHg and pulse pressure < 10 mmHg. The balloon inflation is held for >3 seconds after reaching full deployment volume. Once the balloon has fully deflated the balloon is retracted into the ascending aorta and valve function is assessed using echocardiography. There  is felt to be no paravalvular leak and no central aortic insufficiency.  The patient's hemodynamic recovery following valve deployment is good.  The deployment balloon and guidewire are both removed. Echo demostrated acceptable post-procedural gradients,  stable mitral valve function, and no aortic insufficiency.   PROCEDURE COMPLETION:   The sheath was removed and femoral artery closure performed by Dr Clifton JamesMcAlhany. Please see his separate report for details.  Protamine was administered once femoral arterial repair was complete. The temporary pacemaker, pigtail catheters and femoral sheaths were removed with manual pressure used for hemostasis.   The patient tolerated the procedure well and is transported to the surgical intensive care in stable condition. There were no immediate intraoperative complications. All sponge instrument and needle counts are verified correct at completion of the operation.   No blood products were administered during the operation.  The patient received a total of 94.9 mL of intravenous contrast during the procedure.   Alleen BorneBryan K Ladarion Munyon, MD 04/30/2016

## 2016-04-30 NOTE — Progress Notes (Signed)
Patient ID: Melvin [name], male   DOB: [birth year], 82 y.o.   MRN: [id number]   SICU Evening Rounds:   Hemodynamically stable  Alert and neuro intact  Urine output good  Groin access sites ok  CBC    Component Value Date/Time   WBC 3.2 (L) 04/30/2016 1015   RBC 3.56 (L) 04/30/2016 1015   HGB 10.5 (L) 04/30/2016 1036   HCT 31.0 (L) 04/30/2016 1036   PLT 106 (L) 04/30/2016 1015   MCV 93.0 04/30/2016 1015   MCH 30.3 04/30/2016 1015   MCHC 32.6 04/30/2016 1015   RDW 14.0 04/30/2016 1015   RDW 13.5 09/28/2014 0933   LYMPHSABS 1,971 03/27/2016 1509   LYMPHSABS 1.6 09/28/2014 0933   MONOABS 876 03/27/2016 1509   EOSABS 146 03/27/2016 1509   EOSABS 0.2 09/28/2014 0933   BASOSABS 73 03/27/2016 1509   BASOSABS 0.1 09/28/2014 0933     BMET    Component Value Date/Time   NA 139 04/30/2016 1036   NA 140 09/28/2014 0933   K 3.7 04/30/2016 1036   CL 97 (L) 04/30/2016 0929   CO2 28 04/26/2016 1331   GLUCOSE 142 (H) 04/30/2016 1036   BUN 16 04/30/2016 0929   BUN 17 09/28/2014 0933   CREATININE 0.70 04/30/2016 0929   CREATININE 0.86 03/27/2016 1509   CALCIUM 9.6 04/26/2016 1331   GFRNONAA >60 04/26/2016 1331   GFRAA >60 04/26/2016 1331     A/P:  Stable postop course. Continue current plans

## 2016-04-30 NOTE — CV Procedure (Signed)
[facility]  TAVR OPERATIVE NOTE   Date of Procedure:  04/30/2016  Preoperative Diagnosis: Severe Aortic Stenosis   Postoperative Diagnosis: Same   Procedure:    Transcatheter Aortic Valve Replacement - Transfemoral Approach  Edwards Sapien 3 THV (size 29 mm, model # [id number], serial # [id number])   Co-Surgeons:  Gaye Pollack, MD and Lauree Chandler, MD  Anesthesiologist:  Tobias Alexander  Echocardiographer:  Johnsie Cancel  Pre-operative Echo Findings:  Severe aortic stenosis  Moderate left ventricular systolic dysfunction  Moderate AI  Post-operative Echo Findings:  No paravalvular leak  Moderate left ventricular systolic dysfunction  BRIEF CLINICAL NOTE AND INDICATIONS FOR SURGERY  Patient is an 82 year old male with history of coronary artery disease status post coronary artery bypass grafting in the remote past, 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 The patient's cardiac history dates back to 1991 when he underwent PTCA of his left anterior descending coronary artery. In 1998 he underwent coronary artery bypass grafting 5 by Dr. Roxan Hockey. He did well for many years but eventually developed worsening symptoms of exertional shortness of breath and fatigue. Symptoms have gradually progressed over several years and have become much more significant over the past 6 months. Catheterization in 2016 revealed patent left internal mammary artery to the distal left anterior descending coronary artery, patent saphenous vein graft to the diagonal branch, patent sequential saphenous vein graft to the posterior descending coronary artery and the right  posterolateral branch, and severe disease in the patent saphenous vein graft to the obtuse marginal branch of the left circumflex coronary artery. He underwent PCI and stenting of the vein graft to the obtuse marginal branch using a drug eluding stent. Follow-up echocardiogram performed in May 2017 revealed significant left ventricular systolic dysfunction with ejection fraction 30-35% with moderate left ventricular hypertrophy and moderate aortic stenosis with mean transvalvular gradient estimated 22 mmHg.  Because of concerns of the possibility of severe low ejection fraction low gradient aortic stenosis the patient underwent dobutamine stress echocardiography on 03/20/2016. This confirmed the presence of severe aortic stenosis with elevation of the peak velocity across the aortic valve to 3.9 m/s during dobutamine infusion. He was referred to the multidisciplinary heart valve team and evaluated by Dr. Angelena Form on 03/27/2016.  Left and right heart catheterization was performed by Dr. Claiborne Billings on 03/31/2016. This confirmed the presence of severe multivessel native coronary artery disease but continued patency of all of the previously placed bypass grafts from the patient's original coronary artery bypass surgery. The patient was noted to have at least moderate aortic insufficiency, and attempts to quantitate the severity of aortic stenosis at catheterization were felt to be inaccurate.  Pulmonary artery pressures were only mildly elevated. The patient was referred for surgical consultation and has been evaluated previously by Dr. Cyndia Bent. The patient was felt to be relatively high risk for conventional surgical aortic valve replacement and has subsequently undergone CT angiography to further characterize the feasibility of transcatheter aortic valve replacement. He is here today for TAVR.   During the course of the patient's preoperative work up they have been evaluated comprehensively by a multidisciplinary team of  specialists coordinated through the [facility] in the [facility].  They have been  demonstrated to suffer from symptomatic severe aortic stenosis as noted above. The patient has been counseled extensively as to the relative risks and benefits of all options for the treatment of severe aortic stenosis including long term medical therapy, conventional surgery for aortic valve replacement, and transcatheter aortic valve replacement.  The patient has been independently evaluated by two cardiac surgeons including Dr Cornelius Moras and Dr. Laneta Simmers, and they are felt to be at high risk for conventional surgical aortic valve replacement.  Both surgeons indicated the patient would be a poor candidate for conventional surgery. Based upon review of all of the patient's preoperative diagnostic tests they are felt to be candidate for transcatheter aortic valve replacement using the transfemoral approach as an alternative to high risk conventional surgery.    Following the decision to proceed with transcatheter aortic valve replacement, a discussion has been held regarding what types of management strategies would be attempted intraoperatively in the event of life-threatening complications, including whether or not the patient would be considered a candidate for the use of cardiopulmonary bypass and/or conversion to open sternotomy for attempted surgical intervention.  The patient has been advised of a variety of complications that might develop peculiar to this approach including but not limited to risks of death, stroke, paravalvular leak, aortic dissection or other major vascular complications, aortic annulus rupture, device embolization, cardiac rupture or perforation, acute myocardial infarction, arrhythmia, heart block or bradycardia requiring permanent pacemaker placement, congestive heart failure, respiratory failure, renal failure, pneumonia, infection, other late complications  related to structural valve deterioration or migration, or other complications that might ultimately cause a temporary or permanent loss of functional independence or other long term morbidity.  The patient provides full informed consent for the procedure as described and all questions were answered preoperatively.   DETAILS OF THE OPERATIVE PROCEDURE  PREPARATION:   The patient is brought to the operating room on the above mentioned date and central monitoring was established by the anesthesia team including placement of Swan-Ganz catheter and radial arterial line. The patient is placed in the supine position on the operating table.  Intravenous antibiotics are administered. General endotracheal anesthesia is induced uneventfully. A Foley catheter is placed.  Baseline transesophageal echocardiogram was performed. The patient's chest, abdomen, both groins, and both lower extremities are prepared and draped in a sterile manner. A time out procedure is performed.  PERIPHERAL ACCESS:   Using the modified Seldinger technique, femoral arterial and venous access were obtained with placement of 6 Fr sheaths on the left side.  A pigtail diagnostic catheter was passed through the femoral arterial sheath under fluoroscopic guidance into the aortic root.  A temporary transvenous pacemaker catheter was passed through the femoral venous sheath under fluoroscopic guidance into the right ventricle.  The pacemaker was tested to ensure stable lead placement and pacemaker capture. Aortic root angiography was performed in order to determine the optimal angiographic angle for valve deployment.  TRANSFEMORAL ACCESS:  A micropuncture kit was used to gain access into the right femoral artery. Sheath location confirmed by angiography. Pre-closure with double ProGlide closure devices. The patient was heparinized systemically and ACT verified > 250 seconds.    A 16 Fr transfemoral E-sheath was introduced into the right  femoral artery after progressively dilating over an Amplatz superstiff wire. A Feldman catheter was used to direct a straight-tip exchange length wire across the native aortic valve into the left ventricle. This was exchanged out for a pigtail catheter and position was confirmed in the LV apex.  Simultaneous LV and Ao pressures were recorded.  The pigtail catheter was then exchanged for an Amplatz Extra-stiff wire in the LV apex.   TRANSCATHETER HEART VALVE DEPLOYMENT:  An Edwards Sapien 3 THV (size 29 mm) was prepared and crimped per manufacturer's guidelines, and the proper orientation of the valve is confirmed on the [employer] delivery system. The valve was advanced through the introducer sheath using normal technique until in an appropriate position in the abdominal aorta beyond the sheath tip. The balloon was then retracted and using the fine-tuning wheel was centered on the valve. The valve was then advanced across the aortic arch using appropriate flexion of the catheter. The valve was carefully positioned across the aortic valve annulus. The Commander catheter was retracted using normal technique. Once final position of the valve has been confirmed by angiographic assessment, the valve is deployed while temporarily holding ventilation and during rapid ventricular pacing to maintain systolic blood pressure < 50 mmHg and pulse pressure < 10 mmHg. The balloon inflation is held for >3 seconds after reaching full deployment volume. Once the balloon has fully deflated the balloon is retracted into the ascending aorta and valve function is assessed using TEE. There is felt to be no paravalvular leak and no central aortic insufficiency.  The patient's hemodynamic recovery following valve deployment is good.  The deployment balloon and guidewire are both removed. Echo demostrated acceptable post-procedural gradients, stable mitral valve function, and no AI.   PROCEDURE COMPLETION:  The sheath was then  removed and the closure devices were completed. Protamine was administered once femoral arterial repair was complete. The temporary pacemaker, pigtail catheters and femoral sheaths were removed with manual pressure used for hemostasis.   The patient tolerated the procedure well and is transported to the surgical intensive care in stable condition. There were no immediate intraoperative complications. All sponge instrument and needle counts are verified correct at completion of the operation.   No blood products were administered during the operation.  The patient received a total of 94.9 mL of intravenous contrast during the procedure.  Lauree Chandler MD 04/30/2016 9:52 AM

## 2016-04-30 NOTE — Progress Notes (Signed)
  Echocardiogram Echocardiogram Transesophageal has been performed.  Janalyn HarderWest, Darren Nodal R 04/30/2016, 10:59 AM

## 2016-04-30 NOTE — Anesthesia Procedure Notes (Signed)
Central Venous Catheter Insertion Performed by: anesthesiologist 04/30/2016 6:58 AM Patient location: Pre-op. Preanesthetic checklist: patient identified, IV checked, site marked, risks and benefits discussed, surgical consent, monitors and equipment checked, pre-op evaluation, timeout performed and anesthesia consent Position: Trendelenburg Lidocaine 1% used for infiltration Landmarks identified and Seldinger technique used Catheter size: 8 Fr Central line was placed.Double lumen Procedure performed using ultrasound guided technique. Attempts: 1 Following insertion, dressing applied, line sutured and Biopatch. Post procedure assessment: blood return through all ports, free fluid flow and no air. Patient tolerated the procedure well with no immediate complications.

## 2016-04-30 NOTE — Transfer of Care (Signed)
Immediate Anesthesia Transfer of Care Note  [facility]  Procedure(s) Performed: Procedure(s): TRANSCATHETER AORTIC VALVE REPLACEMENT, TRANSFEMORAL (N/A) TRANSESOPHAGEAL ECHOCARDIOGRAM (TEE) (N/A)  Patient Location: ICU  Anesthesia Type:General  Level of Consciousness: awake, alert  and oriented  Airway & Oxygen Therapy: Patient Spontanous Breathing and Patient connected to nasal cannula oxygen  Post-op Assessment: Report given to RN and Post -op Vital signs reviewed and stable  Post vital signs: Reviewed and stable  Last Vitals:  Vitals:   04/30/16 0620 04/30/16 0625  BP: (!) 171/57   Pulse: (!) 55   Temp:  36.5 C    Last Pain:  Vitals:   04/30/16 0625  TempSrc: Oral      Patients Stated Pain Goal: 3 (04/30/16 0620)  Complications: No apparent anesthesia complications

## 2016-04-30 NOTE — Anesthesia Postprocedure Evaluation (Signed)
Anesthesia Post Note  Patient: Earle GellBenjamin W Scardina  Procedure(s) Performed: Procedure(s) (LRB): TRANSCATHETER AORTIC VALVE REPLACEMENT, TRANSFEMORAL (N/A) TRANSESOPHAGEAL ECHOCARDIOGRAM (TEE) (N/A)  Patient location during evaluation: SICU Anesthesia Type: General Level of consciousness: sedated Pain management: pain level controlled Vital Signs Assessment: post-procedure vital signs reviewed and stable Respiratory status: patient remains intubated per anesthesia plan Cardiovascular status: stable Anesthetic complications: no    Last Vitals:  Vitals:   04/30/16 1100 04/30/16 1129  BP: (!) 98/51   Pulse: (!) 54   Resp: 18   Temp:  36.8 C    Last Pain:  Vitals:   04/30/16 1129  TempSrc: Oral  PainSc:                  Chinonso Linker DANIEL

## 2016-04-30 NOTE — Care Management Note (Signed)
Case Management Note  Patient Details  Name: Aubery LappingBenjamin W Bomberger MRN: [id number] Date of Birth: 1928/07/07  Subjective/Objective:     S/p TAVR               Action/Plan:   PTA from home with wife - c/o more sedentary lifestyles recently - CM will request PT/OT consult.  CM will continue to follow for discharge needs   Expected Discharge Date:                  Expected Discharge Plan:     In-House Referral:     Discharge planning Services  CM Consult  Post Acute Care Choice:    Choice offered to:     DME Arranged:    DME Agency:     HH Arranged:    HH Agency:     Status of Service:  In process, will continue to follow  If discussed at Long Length of Stay Meetings, dates discussed:    Additional Comments:  Cherylann ParrClaxton, Bassam Dresch S, RN 04/30/2016, 11:23 AM

## 2016-04-30 NOTE — H&P (Signed)
[address]       [name] [address]             [phone]      Cardiothoracic Surgery History and Physical   Referring Provider is Lennette Bihari, MD PCP is Colette Ribas, MD      Chief Complaint  Patient presents with  . Aortic Stenosis        HPI:  The patient is an 82 year old gentleman with a history of hypertension, hyperlipidemia, diabetes, PVD and coronary artery disease, s/p CABG x 5 in 1998 by Dr. Dorris Fetch. He has been followed by Dr. Tresa Endo. In March 2016 he presented with worsening fatigue and shortness of breath and cath showed a high grade stenosis in the SVG to the OM which was treated with a DES. He has know ischemic cardiomyopathy with an LVEF of 30-35% by echo in 11/2015. He was also noted to have moderate AS at that time with a mean gradient of 22 mm Hg and an AVA of 1.1 cm2. He recently presented with progressively worsening shortness of breath and fatigue as well as some chest pressure over the past 3-4 months. He says that he is not doing as much activity as he had been due to getting symptoms with mild exertion. He lives at home with his wife and can do easy things around the house and gets out to the grocery store since his wife can't do that. He had a dobutamine stress test on 03/20/2016 and at high dose the peak velocity went up to 3.86 with a mean gradient of 31 mm Hg and an AVA of 1.04 cm2. His SV increased by 22% with dobutamine. He underwent cath on 04/05/2016 showing severe native multi-vessel CAD with patent grafts and no areas of significant ischemia. There was 3+ AI. Right heart pressures were normal and CO was 5.9 L/min. The AV mean gradient was measured at 17.6 mm Hg.        Past Medical History:  Diagnosis Date  . Age-related macular degeneration, dry, both eyes   . Arthritis    "just about all over my body"  . Chronic combined systolic and diastolic CHF (congestive heart failure) (HCC)    a. 11/2015  Echo: EF 30-35%, diff HK, Gr1 DD.  Marland Kitchen Coronary artery disease    a. 1991 PTCA of LAD; b. 1998 CABG x 5 (LIMA->LAD, VG->RPDA->RPLA, VG->OM1, VG->D2; c. 09/2014 MV: lat ischemia, EF 37%; d. 10/2014 PCI: LAD 100, LCX 99ost/100p, RCA 100p, LIMA->LAD ok, VG->D2 ok, VG->OM1 95(3.0x15 Resolute Integrity DES), VG->PDA ok w/ 30% w/in VG->PL.  Marland Kitchen GERD (gastroesophageal reflux disease)   . High cholesterol   . History of hiatal hernia   . Hypertension   . Ischemic cardiomyopathy    a. 11/2015 Echo: EF 30-35%, diff HK, Gr1 DD.  Marland Kitchen LBBB (left bundle branch block)   . Moderate aortic stenosis    a. 11/2015 Echo: EF 30-35%, diff HK, Gr1 DD, mod AS, mild AI, mildly dil Ao root and Asc Ao, mild MR, mod dil LA, PASP .  . Osteoporosis   . PAD (peripheral artery disease) (HCC)    a. s/p prior rotational atherectomy of L pop and tib trunk and PTA of focal popliteal lesions; b. 03/2015 ABI: R - 0.88, L 0.96, LCFA 50-79%, RSFA 50-79% distal stenosis, 3 vessel runoff.  . Palpitations    a. 06/2012 Event monitor: freq ventricular ectopy w/o VT.  Marland Kitchen PVC (premature  ventricular contraction) 04/26/1997   on beta blocker/pt had cardio net monitor 06/23/2012  . RLS (restless legs syndrome) 10/01/2012  . Type II diabetes mellitus (HCC)   . Unspecified hereditary and idiopathic peripheral neuropathy 10/01/2012         Past Surgical History:  Procedure Laterality Date  . APPENDECTOMY    . CARDIAC CATHETERIZATION  04/26/1997   CAD - 50-60% prox to mid LAD with 70% stenosis in mid-distal, 70-80% 1st diag stenosis, 50-60% ostial stenosis in large 2nd diagonal; diffuse RCA stenosis with 40-50% osital narrowing with 70% prox stenosis (Dr. Bishop Limbo) >> CABG  . CARDIAC CATHETERIZATION  11/01/2014  . CARDIAC CATHETERIZATION N/A 04/05/2016   Procedure: Right/Left Heart Cath and Coronary/Graft Angiography;  Surgeon: Lennette Bihari, MD;  Location: [facility] INVASIVE CV LAB;  Service: Cardiovascular;  Laterality: N/A;   . CATARACT EXTRACTION W/ INTRAOCULAR LENS  IMPLANT, BILATERAL Bilateral   . CORONARY ARTERY BYPASS GRAFT  04/1997   CABGx5 - LIMA to LAD, SVG to PDA, SVG to PLA, VG to OM1, VG to 2nd diagonal (Dr. Dorris Fetch)  . DOPPLER ECHOCARDIOGRAPHY  05/18/2012   EF 45-50%, LV systolic function mildly reduced; borderline LA enlargment; mild mitral annular calcif, mild-mod MR; mild TR with normal RVSP; mild calcification of AV leaflets and mild-mod valvular AS with mild regurg      . DOPPLER ECHOCARDIOGRAPHY  08/19/2011   EF 50-55%, mod conc LVH; mild mitral annular calcif with mild MR; mild-mod TR; AV mildly sclerotic with mild valvular AS and mild regurg, mild aortic root dilatation  . HERNIA REPAIR    . HIATAL HERNIA REPAIR    . INGUINAL HERNIA REPAIR Bilateral   . LEFT AND RIGHT HEART CATHETERIZATION WITH CORONARY ANGIOGRAM N/A 11/01/2014   Procedure: LEFT AND RIGHT HEART CATHETERIZATION WITH CORONARY ANGIOGRAM;  Surgeon: Lennette Bihari, MD; EF35-40%, mild AS w/ 2+ AI, LAD, CFX, RCA 100%, LIMA-LAD OK, SVG-D1 OK, SVG-PDA 30%, SVG-OM 95%  . LOWER EXTREMITY ANGIOGRAM  02/05/2011   Diamondback orbital rotational atherectomy, percutaneous transluminal angioplasty of high-grade calcified popliteal & tibioperoneal stenosis (Dr. Erlene Quan)  . LOWER EXTREMITY ARTERIAL DOPPLER  02/2012   RLE - mild to mod arterial insuff; RSFA 50-69% diameter reduction; R pop 50-69% diameter reduction; posterior tibial (R) demonstrates occlusive disease; LSFA/prox pop 0-49% diameter reduction; L distal pop 50-69% diameter reduction; posterior tibial (L) demonstrates occlusive disease  . NM MYOCAR PERF EJECTION FRACTION  09/2011   lexiscan - no reversible ischmai, fixed anteroseptal defect r/t LBBB; EF 51%; septal hypokinesis; low risk but abnormal  . PERCUTANEOUS CORONARY STENT INTERVENTION (PCI-S) N/A 11/02/2014   Procedure: PERCUTANEOUS CORONARY STENT INTERVENTION (PCI-S);  Surgeon: Lennette Bihari, MD; 3.015 mm  Resolute integrity DES to the SVG-OM   . TONSILLECTOMY           Family History  Problem Relation Age of Onset  . CAD Mother   . Heart attack Father     Social History        Social History  . Marital status: Married    Spouse name: Maralyn Sago  . Number of children: 1  . Years of education: College       Occupational History  .  Retired       Social History Main Topics  . Smoking status: Never Smoker  . Smokeless tobacco: Never Used  . Alcohol use No  . Drug use: No  . Sexual activity: No       Other Topics Concern  .  Not on file      Social History Narrative   Pt lives at home with his spouse.   Has been married 42yrs.   Caffeine ZOX:WRUEAV, is right handed          Current Outpatient Prescriptions  Medication Sig Dispense Refill  . amLODipine (NORVASC) 10 MG tablet Take 0.5 tablets (5 mg total) by mouth daily. KEEP OV. 90 tablet 0  . aspirin 81 MG tablet Take 81 mg by mouth every evening.     Marland Kitchen atorvastatin (LIPITOR) 40 MG tablet TAKE 1 TABLET DAILY AT 6 P.M. (KEEP OFFICE VISIT) 90 tablet 2  . cetirizine (ZYRTEC) 10 MG tablet Take 10 mg by mouth daily.    . clopidogrel (PLAVIX) 75 MG tablet TAKE 1 TABLET DAILY (KEEP OFFICE VISIT) 90 tablet 2  . gabapentin (NEURONTIN) 300 MG capsule Take 1 capsule (300 mg total) by mouth at bedtime. 90 capsule 3  . hydrALAZINE (APRESOLINE) 10 MG tablet Take 2 tablets (20 mg total) by mouth every 8 (eight) hours. 270 tablet 1  . metformin (FORTAMET) 1000 MG (OSM) 24 hr tablet Take 1,000 mg by mouth 2 (two) times daily with a meal.    . metoprolol (LOPRESSOR) 100 MG tablet Take 1 tablet (100 mg total) by mouth 2 (two) times daily. 180 tablet 1  . Multiple Vitamins-Minerals (PRESERVISION AREDS 2 PO) Take 1 capsule by mouth 2 (two) times daily.     . multivitamin-iron-minerals-folic acid (CENTRUM) chewable tablet Chew 1 tablet by mouth daily.    . niacin (NIASPAN) 750 MG CR tablet Take 2 tablets (1,500  mg total) by mouth at bedtime. 180 tablet 1  . nitroGLYCERIN (NITROSTAT) 0.4 MG SL tablet Place 1 tablet (0.4 mg total) under the tongue every 5 (five) minutes as needed for chest pain. 25 tablet 3  . oxymetazoline (AFRIN) 0.05 % nasal spray Place 2 sprays into both nostrils 3 (three) times daily as needed for congestion (Allergies).    . polycarbophil (FIBERCON) 625 MG tablet Take 1,250 mg by mouth daily.     Marland Kitchen torsemide (DEMADEX) 20 MG tablet Take 2 tablets in the the morning and 1 tablet. 180 tablet 1  . zoledronic acid (RECLAST) 5 MG/100ML SOLN Inject 100 mLs (5 mg total) into the vein once. 100 mL 0   No current facility-administered medications for this visit.          Allergies  Allergen Reactions  . Other     Patient is allergic to something but doesn't know the name.  . Pramipexole Swelling      Review of Systems:              General:                      normal appetite, decreased energy, no weight gain, no weight loss, no fever             Cardiac:                       has chest pain with exertion, no chest pain at rest, moderate SOB with mild exertion, occasional resting SOB, no PND, no orthopnea, has palpitations, no arrhythmia, no atrial fibrillation, some LE edema, some dizzy spells, no syncope             Respiratory:                 exertional shortness of breath, no home  oxygen, no productive cough, no dry cough, no bronchitis, no wheezing, no hemoptysis, no asthma, no pain with inspiration or cough, no sleep apnea, no CPAP at night             GI:                               no difficulty swallowing, no reflux, no frequent heartburn, no hiatal hernia, no abdominal pain, no constipation, no diarrhea, no hematochezia, no hematemesis, no melena             GU:                              no dysuria,  no frequency, no urinary tract infection, no hematuria, no enlarged prostate, no kidney stones, no kidney disease             Vascular:                      no pain suggestive of claudication, no pain in feet, no leg cramps, no varicose veins, no DVT, no non-healing foot ulcer             Neuro:                         no stroke, no TIA's, no seizures, no headaches, no temporary blindness one eye,  no slurred speech, no peripheral neuropathy, no chronic pain, no instability of gait, no memory/cognitive dysfunction             Musculoskeletal:         has arthritis, no joint swelling, no myalgias, no difficulty walking, no mobility              Skin:                            no rash, no itching, no skin infections, no pressure sores or ulcerations             Psych:                         no anxiety, no depression, no nervousness, no unusual recent stress             Eyes:                           no blurry vision, no floaters, no recent vision changes,  wears glasses or contacts             ENT:                            has hearing loss, no loose or painful teeth, no dentures, last saw dentist Jan 2017             Hematologic:               no easy bruising, no abnormal bleeding, no clotting disorder, no frequent epistaxis             Endocrine:                   has diabetes, does check CBG's at home  Physical Exam:              BP (!) 143/66   Pulse 62   Resp 16   Ht 5\' 6"  (1.676 m)   Wt 173 lb (78.5 kg)   SpO2 96% Comment: RA  BMI 27.92 kg/m              General:                      Elderly,  but  well-appearing and looks his age             HEENT:                       Unremarkable , NCAT, PERLA, EOMI, oropharynx clear             Neck:                           no JVD, no bruits, no adenopathy or thyromegaly             Chest:                          clear to auscultation, symmetrical breath sounds, no wheezes, no rhonchi              CV:                              RRR, grade III/VI crescendo/decrescendo murmur heard best at RSB,  no diastolic murmur             Abdomen:                     soft, non-tender, no masses or organomegaly             Extremities:                 warm, well-perfused, pulses not palpable, no LE edema             Rectal/GU                   Deferred             Neuro:                         Grossly non-focal and symmetrical throughout             Skin:                            Clean and dry, no rashes, no breakdown   Diagnostic Tests:  [facility] [address] [place], [place] [address] [phone]  ------------------------------------------------------------------- Transthoracic Echocardiography  Patient: Melvin [name], Melvin [name] MR #: [id number] Study Date: 11/21/2015 Gender: M Age: 52 Height: 167.6 cm Weight: 79.4 kg BSA: 1.94 m^2 Pt. Status: Room:  ATTENDING Olga Millers ORDERING Barrett, Joline Salt REFERRING Barrett, Joline Salt SONOGRAPHER Dewitt Hoes, RDCS PERFORMING [facility] Outpatient  cc:  ------------------------------------------------------------------- LV EF: 30% - 35%  ------------------------------------------------------------------- Indications: Aortic stenosis (I35.0).  ------------------------------------------------------------------- History: PMH: Dyspnea. Coronary artery disease. Aortic valve disease. Risk factors: LBBB. Palpitation. Tachycardia. Hypertension. Diabetes mellitus. Dyslipidemia.  ------------------------------------------------------------------- Study Conclusions  - Left ventricle: The cavity size was normal. Wall  thickness was increased in a pattern of moderate LVH. Systolic function was moderately to severely reduced. The estimated ejection fraction was in the range of 30% to 35%. Diffuse hypokinesis. Doppler parameters are consistent with abnormal left ventricular relaxation (grade 1 diastolic  dysfunction). Doppler parameters are consistent with high ventricular filling pressure. - Aortic valve: Valve mobility was restricted. There was moderate stenosis. There was mild regurgitation. - Aortic root: The aortic root was mildly dilated. - Ascending aorta: The ascending aorta was mildly dilated. - Mitral valve: There was mild regurgitation. - Left atrium: The atrium was moderately dilated. - Right ventricle: The cavity size was mildly dilated. - Right atrium: The atrium was mildly dilated. - Pulmonary arteries: Systolic pressure was moderately increased. PA peak pressure: 46 mm Hg (S).  Impressions:  - Moderate to severe global reduction in LV function; moderate LVH; grade 1 diastolic dysfunction with elevated LV filling pressure; moderate LAE; calcified aortic valve with moderate AS (mean gradient of 22 mmHg; AVA 1.1 cm2) and mild AI; mild MR; moderate LAE; mild RAE and RVE; mild TR with moderately elevated pulmonary pressure.  ------------------------------------------------------------------- Labs, prior tests, procedures, and surgery: Transthoracic echocardiography (05/10/2014). The aortic valve showed mild stenosis. The aortic valve showed mild regurgitation. EF was 40%. Aortic valve: peak gradient of 32 mm Hg and mean gradient of 15 mm Hg.  Transthoracic echocardiography. M-mode, complete 2D, spectral Doppler, and color Doppler. Birthdate: Patient birthdate: 15-Feb-1928. Age: Patient is 82 yr old. Sex: Gender: male. BMI: 28.3 kg/m^2. Blood pressure: 132/70 Patient status: Outpatient. Study date: Study date: 11/21/2015. Study time: 12:46 PM. Location: [facility]  -------------------------------------------------------------------  ------------------------------------------------------------------- Left ventricle: The cavity size was normal. Wall thickness was increased in a pattern of moderate LVH. Systolic function  was moderately to severely reduced. The estimated ejection fraction was in the range of 30% to 35%. Diffuse hypokinesis. Doppler parameters are consistent with abnormal left ventricular relaxation (grade 1 diastolic dysfunction). Doppler parameters are consistent with high ventricular filling pressure.  ------------------------------------------------------------------- Aortic valve: Trileaflet; moderately calcified leaflets. Valve mobility was restricted. Doppler: There was moderate stenosis. There was mild regurgitation. VTI ratio of LVOT to aortic valve: 0.27. Valve area (VTI): 1.12 cm^2. Indexed valve area (VTI): 0.58 cm^2/m^2. Peak velocity ratio of LVOT to aortic valve: 0.27. Valve area (Vmax): 1.11 cm^2. Indexed valve area (Vmax): 0.57 cm^2/m^2. Mean velocity ratio of LVOT to aortic valve: 0.26. Valve area (Vmean): 1.07 cm^2. Indexed valve area (Vmean): 0.55 cm^2/m^2. Mean gradient (S): 22 mm Hg. Peak gradient (S): 40 mm Hg.  ------------------------------------------------------------------- Aorta: Aortic root: The aortic root was mildly dilated. Ascending aorta: The ascending aorta was mildly dilated.  ------------------------------------------------------------------- Mitral valve: Structurally normal valve. Mobility was not restricted. Doppler: Transvalvular velocity was within the normal range. There was no evidence for stenosis. There was mild regurgitation. Peak gradient (D): 3 mm Hg.  ------------------------------------------------------------------- Left atrium: The atrium was moderately dilated.  ------------------------------------------------------------------- Right ventricle: The cavity size was mildly dilated. Systolic function was normal.  ------------------------------------------------------------------- Pulmonic valve: Doppler: Transvalvular velocity was within the normal range. There was no evidence for  stenosis.  ------------------------------------------------------------------- Tricuspid valve: Structurally normal valve. Doppler: Transvalvular velocity was within the normal range. There was mild regurgitation.  ------------------------------------------------------------------- Pulmonary artery: Systolic pressure was moderately increased.  ------------------------------------------------------------------- Right atrium: The atrium was mildly dilated.  ------------------------------------------------------------------- Pericardium: There was no pericardial effusion.  ------------------------------------------------------------------- Systemic veins: Inferior vena cava: The vessel was normal in size.  ------------------------------------------------------------------- Measurements  Left ventricle Value Reference LV ID, ED,  PLAX chordal (L) 39.1 mm 43 - 52 LV ID, ES, PLAX chordal 33.6 mm 23 - 38 LV fx shortening, PLAX chordal (L) 14 % >=29 LV PW thickness, ED 12.9 mm --------- IVS/LV PW ratio, ED 1.11 <=1.3 Stroke volume, 2D 97 ml --------- Stroke volume/bsa, 2D 50 ml/m^2 --------- LV e&', lateral 5.57 cm/s --------- LV E/e&', lateral 14.97 --------- LV e&', medial 2.68 cm/s --------- LV E/e&', medial 31.12 --------- LV e&', average 4.13 cm/s --------- LV E/e&', average 20.22 ---------  Ventricular septum Value Reference IVS thickness, ED 14.3 mm ---------  LVOT  Value Reference LVOT ID, S 23 mm --------- LVOT area 4.15 cm^2 --------- LVOT peak velocity, S 84.3 cm/s --------- LVOT mean velocity, S 56.4 cm/s --------- LVOT VTI, S 23.4 cm ---------  Aortic valve Value Reference Aortic valve peak velocity, S 315 cm/s --------- Aortic valve mean velocity, S 218 cm/s --------- Aortic valve VTI, S 86.4 cm --------- Aortic mean gradient, S 22 mm Hg --------- Aortic peak gradient, S 40 mm Hg --------- VTI ratio, LVOT/AV 0.27 --------- Aortic valve area, VTI 1.12 cm^2 --------- Aortic valve area/bsa, VTI 0.58 cm^2/m^2 --------- Velocity ratio, peak, LVOT/AV 0.27 --------- Aortic valve area, peak velocity 1.11 cm^2 --------- Aortic valve area/bsa, peak 0.57 cm^2/m^2 --------- velocity Velocity ratio, mean, LVOT/AV 0.26 --------- Aortic valve area, mean velocity 1.07 cm^2 --------- Aortic valve area/bsa, mean 0.55 cm^2/m^2 --------- velocity Aortic regurg pressure half-time 581 ms ---------  Aorta Value Reference Aortic root ID, ED 40 mm --------- Ascending aorta ID, A-P, S 39 mm ---------  Left atrium Value Reference LA ID, A-P, ES 42 mm --------- LA ID/bsa, A-P 2.16 cm/m^2  <=2.2 LA volume, S 81.4 ml --------- LA volume/bsa, S 41.9 ml/m^2 --------- LA volume, ES, 1-p A4C 54.7 ml --------- LA volume/bsa, ES, 1-p A4C 28.2 ml/m^2 --------- LA volume, ES, 1-p A2C 104 ml --------- LA volume/bsa, ES, 1-p A2C 53.5 ml/m^2 ---------  Mitral valve Value Reference Mitral E-wave peak velocity 83.4 cm/s --------- Mitral A-wave peak velocity 115 cm/s --------- Mitral deceleration time (H) 380 ms 150 - 230 Mitral peak gradient, D 3 mm Hg --------- Mitral E/A ratio, peak 0.7 ---------  Pulmonary arteries Value Reference PA pressure, S, DP (H) 46 mm Hg <=30  Tricuspid valve Value Reference Tricuspid regurg peak velocity 309 cm/s --------- Tricuspid peak RV-RA gradient 38 mm Hg ---------  Systemic veins Value Reference Estimated CVP 8 mm Hg ---------  Right ventricle Value Reference RV pressure, S, DP (H) 46 mm Hg <=30 RV s&', lateral, S 7.08 cm/s ---------  Legend: (L) and (H) mark values outside specified reference range.  ------------------------------------------------------------------- Prepared and Electronically Authenticated by  Olga Millers 2017-05-16T16:13:27   [facility] [address] [place], [place]  [address] [phone]  ------------------------------------------------------------------- Stress Echocardiography  Patient: Melvin [name], Melvin [name] MR #: [id number] Study Date: 03/20/2016 Gender: M Age: 89 Height: 167.6 cm Weight: 78.6 kg BSA: 1.93 m^2 Pt. Status: Room:  ATTENDING Charlton Haws, M.D. REFERRING [place] [id number] SONOGRAPHER Randa Evens, Will ORDERING Nicolasa Ducking, MD REFERRING Nicolasa Ducking, MD PERFORMING [facility] Outpatient  cc:  -------------------------------------------------------------------  ------------------------------------------------------------------- Indications: (I35.0).  ------------------------------------------------------------------- History: PMH: Aortic stenosis.  ------------------------------------------------------------------- Study Conclusions  - Impressions: Stage: Peak Vel Mean Gradient Peak Gradient AVA  Baseline 3.4 m/sec 25 mmHg 46 mmHg 1.02 cm2 Low Dose 3.56 m/sec 28 mmHg 51 mmHg .92 cm2 Mid Dose 3.74 m/sec 31 mmHg 56 mmHg 1.08 cm2 High Dose 3.86 m/sec 31 mmHg 60 mmHg 1.04 cm2  SV increased by  22% with Dobutamine infusion indictating some contractile reserve Overall findings consistent wtih moderate to severe fixed AS  Impressions:  - Stage: Peak Vel Mean Gradient Peak Gradient AVA  Baseline 3.4 m/sec 25 mmHg 46 mmHg 1.02 cm2 Low Dose 3.56 m/sec 28 mmHg 51 mmHg .92 cm2 Mid Dose 3.74 m/sec 31 mmHg 56 mmHg 1.08 cm2 High Dose 3.86 m/sec 31 mmHg 60 mmHg 1.04 cm2  SV increased by 22% with Dobutamine infusion indictating some contractile reserve Overall findings consistent wtih moderate to severe fixed AS  ------------------------------------------------------------------- Study data: Study status: Routine. Consent: The risks, benefits, and alternatives to the procedure were  explained to the patient and informed consent was obtained. Procedure: The patient reported no pain pre or post test. Initial setup. The patient was brought to the laboratory. A baseline ECG was recorded. Surface ECG leads and automatic cuff blood pressure measurements were monitored. Dobutamine stress test. Stress testing was performed, with dobutamine infusion from 5 to 20 mcg/kg/min. The infusion was terminated due to maximal dose administration. Transthoracic stress echocardiography for assessment of valvular function. Images were captured at baseline, low dose, peak dose, and recovery. Study completion: The patient tolerated the procedure well. There were no complications. Dobutamine. Stress echocardiography. 2D. Birthdate: Patient birthdate: 16-Feb-1928. Age: Patient is 82 yr old. Sex: Gender: male. BMI: 28 kg/m^2. Blood pressure: 142/64 Patient status: Outpatient. Study date: Study date: 03/20/2016. Study time: 03:13 PM.  -------------------------------------------------------------------  ------------------------------------------------------------------- Aortic valve: Doppler: VTI ratio of LVOT to aortic valve: 0.22. Valve area (VTI): 0.99 cm^2. Indexed valve area (VTI): 0.51 cm^2/m^2. Peak velocity ratio of LVOT to aortic valve: 0.23. Valve area (Vmax): 1.04 cm^2. Indexed valve area (Vmax): 0.54 cm^2/m^2. Mean gradient (S): 28 mm Hg. Peak gradient (S): 51 mm Hg.  ------------------------------------------------------------------- Stress protocol:  +----------------------+--+----------+-------------------+--------+ Stage HRBP (mmHg) Rhythm Symptoms +----------------------+--+----------+-------------------+--------+ Baseline 54142/64 Occasional PVC&'s None    (90)     +----------------------+--+----------+-------------------+--------+ Dobutamine 5 ug/kg/min51124/52 ---------------------------   (76)    +----------------------+--+----------+-------------------+--------+ Dobutamine 10 52132/44 --------------------------- ug/kg/min  (73)    +----------------------+--+----------+-------------------+--------+ Dobutamine 20 56136/40 Ventricular -------- ug/kg/min  (72) couplets   +----------------------+--+----------+-------------------+--------+ Immediate post stress 57134/42 Occasional PVC&'s --------   (73)    +----------------------+--+----------+-------------------+--------+ Recovery; 1 min 58------------------------------------- +----------------------+--+----------+-------------------+--------+ Recovery; 2 min 58----------Ventricular --------    couplets   +----------------------+--+----------+-------------------+--------+ Recovery; 3 min 60142/48 Ventricular --------   (79) couplets   +----------------------+--+----------+-------------------+--------+ Recovery; 4 min 60------------------------------------- +----------------------+--+----------+-------------------+--------+ Recovery; 5 min 55----------Occasional PVC&'s -------- +----------------------+--+----------+-------------------+--------+ Recovery; 6 min 54144/60 Occasional PVC&'s --------   (88)     +----------------------+--+----------+-------------------+--------+ Recovery; 7 min 54----------Occasional PVC&'s -------- +----------------------+--+----------+-------------------+--------+  ------------------------------------------------------------------- Stress results: Maximal heart rate during stress was 60 bpm (45% of maximal predicted heart rate). The maximal predicted heart rate was 134 bpm.The target heart rate was achieved. The heart rate response to stress was blunted. There was a normal resting blood pressure. Abnormal blood pressure response to dobutamine. The rate-pressure product for the peak heart rate and blood pressure was 8520 mm Hg/min. The patient experienced no chest pain during stress.  ------------------------------------------------------------------- Stress ECG: SR nonspecific ST changes Non diagnostic  ------------------------------------------------------------------- Measurements  Left ventricle Value Stroke volume, 2D 93 ml Stroke volume/bsa, 2D 48 ml/m^2  LVOT Value LVOT ID, S 24 mm LVOT area 4.52 cm^2 LVOT peak velocity, S 82.7 cm/s LVOT VTI, S 20.5 cm LVOT peak gradient, S 3 mm Hg  Aortic valve Value Aortic valve peak velocity, S 358 cm/s Aortic valve mean velocity, S 249 cm/s Aortic valve VTI, S 93.7 cm Aortic mean gradient, S 28 mm  Hg Aortic peak gradient, S 51 mm Hg VTI ratio, LVOT/AV 0.22 Aortic valve area, VTI 0.99 cm^2 Aortic valve area/bsa, VTI 0.51 cm^2/m^2 Velocity  ratio, peak, LVOT/AV 0.23 Aortic valve area, peak velocity 1.04 cm^2 Aortic valve area/bsa, peak velocity 0.54 cm^2/m^2  Legend: (L) and (H) mark values outside specified reference range.  ------------------------------------------------------------------- Prepared and Electronically Authenticated by  Charlton Haws, M.D. 2017-09-13T16:21:46  Physicians   Panel Physicians Referring Physician Case Authorizing Physician  Lennette Bihari, MD (Primary)    Procedures   Right/Left Heart Cath and Coronary/Graft Angiography  Conclusion     Ost 2nd Diag to 2nd Diag lesion, 90 %stenosed.  Mid LAD to Dist LAD lesion, 100 %stenosed.  Ost Cx lesion, 100 %stenosed.  Ost RCA lesion, 100 %stenosed.  SVG.  SVG.  SVG.  Origin to Prox Graft lesion, 20 %stenosed.  LIMA.  There is moderate (3+) aortic regurgitation.  Severe multivessel native CAD with 90% stenosis in the second diagonal branch of the LAD and total occlusion of the LAD after the proximal septal perforating artery; total occlusion of the left circumflex vessel ostially and total occlusion of the RCA ostium.  Patent LIMA graft which supplies the mid LAD. There is a smooth 10% ostial narrowing. The subclavian vessel takes off much more rightward from the flattened aortic arch.  Patent SVG which supplies the circumflex marginal vessel. A stent is in the proximal portion of the graft which is patent and has smooth inferior intimal hyperplasia of less than 20%.  Patent SVG which supplies the second diagonal vessel of the LAD.  Patent SVG which supplies the mid PDA. A sequential limb was not visualized, but the distal RCA gave rise to 3 additional branches beyond the PDA takeoff.  Supravalvular aortography mentioning a dilated aortic root with markedly reduced aortic valve excursion, and at least 2+ angiographic aortic insufficiency.  Significant aortic valve stenosis which  has been documented on dobutamine echo as low gradient severe AS.  Mild pulmonary hypertension.  RECOMMENDATION: The present catheterization continues to demonstrate patency to the vein graft conduits as well as the left internal mammary artery with severe native CAD. The patient has experienced a definite decline in functional capacity and on recent echo Doppler study EF has declined to 35%. I have recommended that he follow-up with Dr. Sanjuana Kava to proceed with TAVR and additional imaging requirements prior to final recommendations.   Indications   Coronary artery disease involving coronary bypass graft of native heart with other forms of angina pectoris (HCC) [I25.708 (ICD-10-CM)]  Aortic stenosis [I35.0 (ICD-10-CM)]  Cardiomyopathy (HCC) [I42.9 (ICD-10-CM)]  Procedural Details/Technique   Technical Details Melvin [name] is an 81 year old Caucasian male who has a history of HTN, HLD, DM, CAD s/p CABG and prior PCI, ischemic cardiomyopathy, diabetes, PAD with prior lower extremity interventions, LBBB, PVCS and progressive AV stenosis. He underwent PTCA of his LAD in 1991 and in 1998 underwent 5V CABG. In March of 2016 he developed progressive fatigue and dyspnea and repeat cardiac cath showed patent LIMA to LAD, patent SVG to diagonal, patent sequential SVG to PDA and PLA with severe disease in the SVG to OM. A Resolute DES stent was placed in the SVG to the obtuse marginal. He has developed progressive aortic valve stenosis. His echo in May 2017 showed LVEF=30-35% with moderate LVH, diffuse hypokinesis of the LV with moderate aortic stenosis with mean gradient of 22 mmHg and AVA 1.1cm2. There was concern for low output aortic stenosis. A dobutamine stress echo  on 03/20/16 demonstrated elevation of velocity across the aortic valve with infusion of dobutamine (3.86 m/sec with mean gradient 31 mm Hg, peak gradient 60 mmHg, AVA 1.0cm2). He was evaluated by Dr. Sanjuana Kava for consideration  for TAVR. He presents for right and left heart catheterization prior to final recommendations.  The patient presented to the catheterization laboratory in the fasting state. His right femoral artery and right femoral vein were punctured anteriorly and a arterial sheath and venous sheaths were inserted. A Swan-Ganz catheter was inserted into the venous sheath and was advanced to the RA and RV position. However, this catheter even with wire assistance was unable to enter the RVOT, and PA and ultimately was removed. Diagnostic catheterization was then performed. The the arterial sheath and 5 French Judkins 5 affect and JR4 right catheters were used for angiography into the native coronary arteries as well as into all 3 vein grafts. A LIMA catheter was necessary for selective angiography into the left internal mammary artery. Of note, due to the elongated aortic arch, the subclavian takeoff was much more rightward than normally. An S shaped Swan catheter was then inserted and with a Glidewire support. Ultimately, the catheter was able to be passed into the pulmonary artery and wedge positions. Oxygen saturation was obtained in the pulmonary artery and aorta. Cardiac output determinations were done both by the thermodilution and Fick methods. A pigtail catheter was then inserted and advanced to the central aorta. Central AO and PA pressures were recorded. Supravalvular aortography was performed. An attempt was made to cross the aortic valve, although the wire was crossed. The pigtail did not cross the system. Pullback. The pigtail catheter was then removed and a right catheter was inserted and with a long straight wire the aortic valve was crossed and the RC catheter was placed into the ventricle. Since the patient had a recent echo Doppler study showing reduced LV function. Left ventriculography was not performed, but I LV to AO pullback was done. During the pullback, the patient had his typical PVCs which made the  pressure determinations difficult to be calculated by the software. All catheters were removed from the patient. The patient tolerated the procedure well and returned to his room in stable condition with plans for discharge later today.   Estimated blood loss <50 mL.  During this procedure the patient was administered the following to achieve and maintain moderate conscious sedation: Versed 1 mg, Fentanyl 25 mcg, while the patient's heart rate, blood pressure, and oxygen saturation were continuously monitored. The period of conscious sedation was 75 minutes, of which I was present face-to-face 100% of this time.    Coronary Findings   Dominance: Right  Left Main  The left main was long vessel which essentially gave rise to the LAD and 2 proximal diagonal vessels. The circumflex was occluded at its origin.  Left Anterior Descending  The LAD gave rise to 2 proximal diagonal vessels. The second diagonal vessel had proximal 90% stenosis. He septal perforating artery arose after the diagonal vessel and the LAD was occluded beyond the septal perforating artery.  Mid LAD to Dist LAD lesion, 100% stenosed.  Second Diagonal [employer] 2nd Diag to 2nd Diag lesion, 90% stenosed.  Left Circumflex  Left circumflex vessel was occluded at its origen.  Ost Cx lesion, 100% stenosed.  Right Coronary Artery  RCA was occluded at its origin.  Ost RCA lesion, 100% stenosed.  Graft Angiography  saphenous Graft to RPDA  SVG.  saphenous Graft  to 2nd Diag  SVG.  saphenous Graft to 1st Mrg  SVG.  Origin to Prox Graft lesion, 20% stenosed. The lesion was previously treated.  Free LIMA Graft to Dist LAD  LIMA.  Right Heart   Right Heart Pressures RA: a 2; V 5; mean 3 RV: 37/5 PA: 37/10 PW: a 12 ; v 13; mean 9  AO: 139/44 PA: 30/12  LV 165/10 PW: a 13; v 22; mean 11  The highest LV pressure recorded was 176/9. On pullback, there was ectopy and several consecutive beats could not be accurately  calibrated which may contribute to erroneous data.  LV:163/18 AO: 152/40  Cardiac output by the Fick method was 6.6, and by the thermodilution was 5.9 L/m Cardiac index by the Fick method was 3.5 and by the thermodilution method was 3.1 L/m/m. The pullback pressures may not be completely accurate in the software calculated mean pressure was 17.6 with a 1.3 cm AVA    Left Heart   Left Ventricle LVEDP 10 mm Hg    Aorta Aortic Root: The aortic root displays dilation. There is moderate (3+) aortic regurgitation.   Aortic Arch: Supravalvular aortography demonstrated a dilated aortic root with significantly reduced aortic valve excursion at least moderate aortic insufficiency.    Coronary Diagrams   Diagnostic Diagram     Implants        No implant documentation for this case.  PACS Images   Show images for Cardiac catheterization   Link to Procedure Log   Procedure Log    Hemo Data   Flowsheet Row Most Recent Value  Fick Cardiac Output 6.56 L/min  Fick Cardiac Output Index 3.47 (L/min)/BSA  Thermal Cardiac Output 5.86 L/min  Thermal Cardiac Output Index 3.1 (L/min)/BSA  Aortic Mean Gradient 17.6 mmHg  Aortic Peak Gradient 13 mmHg  Aortic Valve Area 1.31  Aortic Value Area Index 0.69 cm2/BSA  RA A Wave 2 mmHg  RA V Wave 5 mmHg  RA Mean 3 mmHg  RV Systolic Pressure 40 mmHg  RV Diastolic Pressure 0 mmHg  RV EDP 6 mmHg  PA Systolic Pressure 30 mmHg  PA Diastolic Pressure 12 mmHg  PA Mean 20 mmHg  PW A Wave 13 mmHg  PW V Wave 22 mmHg  PW Mean 11 mmHg  AO Systolic Pressure 139 mmHg  AO Diastolic Pressure 44 mmHg  AO Mean 72 mmHg  LV Systolic Pressure 176 mmHg  LV Diastolic Pressure 3 mmHg  LV EDP 9 mmHg  Arterial Occlusion Pressure Extended Systolic Pressure 152 mmHg  Arterial Occlusion Pressure Extended Diastolic Pressure 40 mmHg  Arterial Occlusion Pressure Extended Mean Pressure 69 mmHg  Left Ventricular Apex Extended Systolic Pressure 163 mmHg  Left  Ventricular Apex Extended Diastolic Pressure 2 mmHg  Left Ventricular Apex Extended EDP Pressure 18 mmHg  QP/QS 1  TPVR Index 6.78 HRUI  TSVR Index 27.74 HRUI  PVR SVR Ratio 0.14  TPVR/TSVR Ratio 0.24    Addendum   ADDENDUM REPORT: 04/15/2016 16:58  CLINICAL DATA:  82 year old male with severe aortic stenosis.  EXAM: Cardiac TAVR CT  TECHNIQUE: The patient was scanned on a Philips 256 scanner. A 120 kV retrospective scan was triggered in the descending thoracic aorta at 111 HU's. Gantry rotation speed was 270 msecs and collimation was .9 mm. No beta blockade or nitro were given. The 3D data set was reconstructed in 5% intervals of the R-R cycle. Systolic and diastolic phases were analyzed on a dedicated work station using MPR, MIP and  VRT modes. The patient received 80 cc of contrast.  FINDINGS: Aortic Valve: Severely thickened and calcified with severe asymmetric calcification under the left coronary cusp extending into the LVOT.  Aorta:  Normal size, no dissection, moderate diffuse calcifications.  Sinotubular Junction:  37 x 32 mm  Ascending Thoracic Aorta:  38 x 38 mm  Aortic Arch:  30 x 28 mm  Descending Thoracic Aorta:  28 x 26 mm  Sinus of Valsalva Measurements:  Non-coronary:  39 mm  Right -coronary:  37 mm  Left -coronary:  37 mm  Coronary Artery Height above Annulus:  Left Main:  14 mm  Right Coronary:  17 mm  Virtual Basal Annulus Measurements:  Maximum/Minimum Diameter:  30 x 26 mm  Perimeter:  86 mm  Area:  580 mm2  Optimum Fluoroscopic Angle for Delivery:  RAO 1 CRA 0  IMPRESSION: 1. Severely calcified and thickened tricuspid aortic valve with severe asymmetric calcification under the left coronary cusp extending into the LVOT and annular measurements suitable for delivery of 29 mmEdwards-SAPIEN 3 valve.  2.  Sufficient coronary to annulus distance.  3. Optimum Fluoroscopic Angle for Delivery:  RAO 1 CRA  0  4.  No thrombus in the left atrial appendage.  Tobias Alexander   Electronically Signed   By: Tobias Alexander   On: 04/15/2016 16:58   Addended by Lars Masson, MD on 04/15/2016 5:00 PM    Study Result   EXAM: OVER-READ INTERPRETATION  CT CHEST  The following report is an over-read performed by radiologist Dr. Royal Piedra [facility] on 04/15/2016. This over-read does not include interpretation of cardiac or coronary anatomy or pathology. The coronary calcium score/coronary CTA interpretation by the cardiologist is attached.  COMPARISON:  None.  FINDINGS: Extracardiac findings will be dictated separately under accession number for contemporaneously obtained CTA of the chest, abdomen and pelvis 04/15/2016.  IMPRESSION: Please see separate dictation for contemporaneously obtained CTA of the chest, abdomen and pelvis 04/15/2016 for full description of extracardiac findings.  Electronically Signed: By: Trudie Reed M.D. On: 04/15/2016 15:49      CLINICAL DATA:  82 year old male with history of severe aortic stenosis. Preprocedural study prior to potential transcatheter aortic valve replacement (TAVR).  EXAM: CT ANGIOGRAPHY CHEST, ABDOMEN AND PELVIS  TECHNIQUE: Multidetector CT imaging through the chest, abdomen and pelvis was performed using the standard protocol during bolus administration of intravenous contrast. Multiplanar reconstructed images and MIPs were obtained and reviewed to evaluate the vascular anatomy.  CONTRAST:  150 mL of Isovue 370.  COMPARISON:  No priors.  FINDINGS: CTA CHEST FINDINGS  Cardiovascular: Heart size is enlarged with left ventricular dilatation. There is no significant pericardial fluid, thickening or pericardial calcification. There is aortic atherosclerosis, as well as atherosclerosis of the great vessels of the mediastinum and the coronary arteries, including calcified  atherosclerotic plaque in the left main, left anterior descending, left circumflex and right coronary arteries. Status post median sternotomy for CABG, including LIMA to the LAD. Thickening calcification of the aortic valve. Calcification of the anterior leaflet of the mitral valve.  Mediastinum/Lymph Nodes: No pathologically enlarged mediastinal or hilar lymph nodes. Esophagus is unremarkable in appearance. No axillary lymphadenopathy.  Lungs/Pleura: Incomplete visualization of the lung apices. Within the visualized portions of the lungs there are no suspicious appearing pulmonary nodules or masses, there is no acute consolidative airspace disease, no pleural effusions and no pneumothorax. Areas of mild scarring are noted in the anterior aspect of the left upper  lobe.  Musculoskeletal/Soft Tissues: Median sternotomy wires. There are no aggressive appearing lytic or blastic lesions noted in the visualized portions of the skeleton.  CTA ABDOMEN AND PELVIS FINDINGS  Hepatobiliary: 1.7 cm low-attenuation lesion in segment 7 of the liver with peripheral nodular enhancement, incompletely characterized, but likely a cavernous hemangioma. Sub cm low-attenuation lesion in segment 8 too small to characterize, but likely a cyst. No intra or extrahepatic biliary ductal dilatation. Gallbladder is normal in appearance.  Pancreas: No pancreatic mass. No pancreatic ductal dilatation. No pancreatic or peripancreatic fluid or inflammatory changes.  Spleen: Unremarkable.  Adrenals/Urinary Tract: 2.1 cm simple cyst extending off the posterior aspect of the interpolar region of the left kidney. Other sub cm low-attenuation lesions in both kidneys are too small to definitively characterize, but are favored to represent tiny cysts. There is also a 1.4 cm high attenuation (56 HU) lesion extending exophytically from the medial aspect of the upper pole of the right kidney (image 90 of series  4). Additionally, there are 2 intermediate attenuation lesions in the lower poles of the kidneys bilaterally measuring 1.7 cm on the right (image 113 of series 4) and 1.8 cm on the left (image 96 of series 4). Bilateral adrenal glands are normal in appearance. No hydroureteronephrosis. Urinary bladder is unremarkable in appearance.  Stomach/Bowel: Normal appearance of the stomach. No pathologic dilatation of small bowel or colon. Several scattered colonic diverticulae are noted, without surrounding inflammatory changes to suggest an acute diverticulitis at this time. The appendix is not confidently identified and may be surgically absent. Regardless, there are no inflammatory changes noted adjacent to the cecum to suggest the presence of an acute appendicitis at this time.  Vascular/Lymphatic: Aortic atherosclerosis, without evidence of aneurysm or dissection in the abdominal or pelvic vasculature. Vascular findings and measurements pertinent to potential TAVR procedure, as detailed below. Celiac axis, superior mesenteric artery and inferior mesenteric artery and their major branches are all widely patent, although there appears to be mild stenosis in the proximal superior mesenteric artery (less than 50% diameter stenosis). Single renal arteries bilaterally, both widely patent. No lymphadenopathy noted in the abdomen or pelvis.  Reproductive: Prostate gland and seminal vesicles are unremarkable in appearance.  Other: Epigastric ventral hernia containing only omental fat. No associated bowel incarceration or obstruction at this time. No significant volume of ascites. No pneumoperitoneum.  Musculoskeletal: There are no aggressive appearing lytic or blastic lesions noted in the visualized portions of the skeleton.  VASCULAR MEASUREMENTS PERTINENT TO TAVR:  AORTA:  Minimal Aortic Diameter -  16 x 15 mm  Severity of Aortic Calcification -  moderate to severe  RIGHT  PELVIS:  Right Common Iliac Artery -  Minimal Diameter - 9.9 x 7.3 mm  Tortuosity - mild  Calcification - moderate  Right External Iliac Artery -  Minimal Diameter - 8.9 x 4.8 mm  Tortuosity - mild  Calcification - mild  Right Common Femoral Artery -  Minimal Diameter - 7.5 x 6.2 mm  Tortuosity - mild  Calcification - moderate  LEFT PELVIS:  Left Common Iliac Artery -  Minimal Diameter - 9.9 x 7.8 mm  Tortuosity - mild  Calcification - mild  Left External Iliac Artery -  Minimal Diameter - 8.7 x 8.5 mm  Tortuosity - mild  Calcification - mild  Left Common Femoral Artery -  Minimal Diameter - 9.3 x 9.6 mm  Tortuosity - mild  Calcification - mild  Review of the MIP images confirms the above findings.  IMPRESSION: 1. Vascular findings and measurements pertinent to potential TAVR procedure, as detailed above. This patient appears to have suitable pelvic arterial access bilaterally. 2. Thickening calcification of the aortic valve, compatible with the reported clinical history of severe aortic stenosis. 3. Cardiomegaly with left ventricular dilatation. 4. Aortic atherosclerosis, in addition to left main and 3 vessel coronary artery disease.Status post median sternotomy for CABG, including LIMA to the LAD. 5. Several indeterminate lesions in the kidneys bilaterally, favored to represent proteinaceous cysts. The lesion in the medial aspect of the upper pole of the right kidney and in the lower pole the right kidney both appear relatively stable in size compared to prior study 11/19/2011, making the likelihood of aggressive lesions very low. By comparison, the lesion in the medial aspect of the lower pole of the left kidney is new compared to the prior examination and therefore more concerning. These are considered Bosniak class 1F lesions, and warrant follow-up evaluation with MRI of the abdomen with and without IV gadolinium  in 6 months to ensure the stability of these lesions and to provide better characterization. This recommendation follows ACR consensus guidelines: Management of the Incidental Renal Mass on CT: A White Paper of the ACR Incidental Findings Committee. J Am Coll Radiol 2017; article in press. 6. Additional incidental findings, as above.   Electronically Signed   By: Trudie Reed M.D.   On: 04/15/2016 17:03  RISK SCORES About the STS Risk Calculator Procedure: AV Replacement  Risk of Mortality: 6.61%  Morbidity or Mortality: 28.343%  Long Length of Stay: 13.354%  Short Length of Stay: 16.166%  Permanent Stroke: 2.64%  Prolonged Ventilation: 17.143%  DSW Infection: 0.246%  Renal Failure: 8.314%  Reoperation: 10.739%   Impression:  This 82 year old gentleman has stage D severe, symptomatic aortic stenosis with NYHA class III symptoms of exertional fatigue and shortness of breath with some chest discomfort and dizziness with position changes. I have personally reviewed and interpreted his echo, dobutamine stress echo, and cath  He has a severely calcified trileaflet aortic valve with restricted mobility. His mean AV gradient on his dobutamine stress was 25 mm Hg, increasing to 31 mm Hg at high dose. He had a significant contractile reserve. His peak velocity ratio was 0.23 and indexed valve area 0.54 cm2/m2, consistent with severe AS. He also has 3+ AI which is likely contributing to his symptoms. His cardiac cath shows patent grafts with no significant areas of coronary ischemia. His LVEF is moderately depressed at 30-35% and I think that the combination of depressed EF with severe AS and moderate AI is responsible for his symptoms. I think AVR is indicated in this patient. The patient and his wife were counseled at length regarding treatment alternatives for management of severe symptomatic aortic stenosis. Alternative approaches such as conventional aortic valve replacement,  transcatheter aortic valve replacement, and palliative medical therapy were compared and contrasted at length. The risks associated with conventional surgical aortic valve replacement were been discussed in detail, as were expectations for post-operative convalescence. Long-term prognosis with medical therapy was discussed. I think he would be at high risk for open surgical AVR at his advanced age with prior CABG. TAVR is a much better option for him. His gated cardiac CT shows a severely calcified and thickened trileaflet aortic valve with severe asymmetric calcification under the left coronary cusp extending into the LVOT that could increase his risk of a paravalvular leak but the annular measurement is suitable for a Sapien 3 valve.  His abdominal and pelvic CTA shows adequate pelvic vasculature for transfemoral access.  I discussed transcatheter aortic valve replacement, what types of management strategies would be attempted intraoperatively in the event of life-threatening complications, including whether or not the patient would be considered a candidate for the use of cardiopulmonary bypass and/or conversion to open sternotomy for attempted surgical intervention.  The patient has been advised of a variety of complications that might develop including but not limited to risks of death, stroke, paravalvular leak, aortic dissection or other major vascular complications, aortic annulus rupture, device embolization, cardiac rupture or perforation, mitral regurgitation, acute myocardial infarction, arrhythmia, heart block or bradycardia requiring permanent pacemaker placement, congestive heart failure, respiratory failure, renal failure, pneumonia, infection, other late complications related to structural valve deterioration or migration, or other complications that might ultimately cause a temporary or permanent loss of functional independence or other long term morbidity. He would like to proceed with  TAVR.  Plan:  Transfemoral TAVR  Alleen BorneBryan K. Bartle, MD

## 2016-04-30 NOTE — Progress Notes (Signed)
CT surgery p.m. Rounds  Patient resting comfortably No groin hematomas Stable hemodynamics

## 2016-04-30 NOTE — Interval H&P Note (Signed)
History and Physical Interval Note:  04/30/2016 5:50 AM  Earle GellBenjamin W Asch  has presented today for surgery, with the diagnosis of SEVERE AS  The various methods of treatment have been discussed with the patient and family. After consideration of risks, benefits and other options for treatment, the patient has consented to  Procedure(s): TRANSCATHETER AORTIC VALVE REPLACEMENT, TRANSFEMORAL (N/A) TRANSESOPHAGEAL ECHOCARDIOGRAM (TEE) (N/A) as a surgical intervention .  The patient's history has been reviewed, patient examined, no change in status, stable for surgery.  I have reviewed the patient's chart and labs.  Questions were answered to the patient's satisfaction.     Alleen BorneBryan K Rei Medlen

## 2016-05-01 ENCOUNTER — Encounter (HOSPITAL_COMMUNITY): Payer: Self-pay | Admitting: Cardiovascular Disease

## 2016-05-01 ENCOUNTER — Inpatient Hospital Stay (HOSPITAL_COMMUNITY): Payer: Medicare Other

## 2016-05-01 DIAGNOSIS — I350 Nonrheumatic aortic (valve) stenosis: Secondary | ICD-10-CM

## 2016-05-01 DIAGNOSIS — Z954 Presence of other heart-valve replacement: Secondary | ICD-10-CM

## 2016-05-01 DIAGNOSIS — I10 Essential (primary) hypertension: Secondary | ICD-10-CM

## 2016-05-01 LAB — GLUCOSE, CAPILLARY
GLUCOSE-CAPILLARY: 137 mg/dL — AB (ref 65–99)
GLUCOSE-CAPILLARY: 155 mg/dL — AB (ref 65–99)
GLUCOSE-CAPILLARY: 157 mg/dL — AB (ref 65–99)
GLUCOSE-CAPILLARY: 175 mg/dL — AB (ref 65–99)
GLUCOSE-CAPILLARY: 90 mg/dL (ref 65–99)
Glucose-Capillary: 105 mg/dL — ABNORMAL HIGH (ref 65–99)
Glucose-Capillary: 94 mg/dL (ref 65–99)

## 2016-05-01 LAB — CBC
HEMATOCRIT: 31.6 % — AB (ref 39.0–52.0)
HEMOGLOBIN: 10.4 g/dL — AB (ref 13.0–17.0)
MCH: 31 pg (ref 26.0–34.0)
MCHC: 32.9 g/dL (ref 30.0–36.0)
MCV: 94.3 fL (ref 78.0–100.0)
Platelets: 98 10*3/uL — ABNORMAL LOW (ref 150–400)
RBC: 3.35 MIL/uL — AB (ref 4.22–5.81)
RDW: 14.2 % (ref 11.5–15.5)
WBC: 7.4 10*3/uL (ref 4.0–10.5)

## 2016-05-01 LAB — ECHOCARDIOGRAM LIMITED: Weight: 2818.36 oz

## 2016-05-01 LAB — BASIC METABOLIC PANEL
Anion gap: 9 (ref 5–15)
BUN: 9 mg/dL (ref 6–20)
CHLORIDE: 101 mmol/L (ref 101–111)
CO2: 29 mmol/L (ref 22–32)
CREATININE: 0.79 mg/dL (ref 0.61–1.24)
Calcium: 8.7 mg/dL — ABNORMAL LOW (ref 8.9–10.3)
GFR calc Af Amer: >60 mL/min (ref >60)
GFR calc non Af Amer: >60 mL/min (ref >60)
GLUCOSE: 96 mg/dL (ref 65–99)
POTASSIUM: 3.8 mmol/L (ref 3.5–5.1)
SODIUM: 139 mmol/L (ref 135–145)

## 2016-05-01 LAB — MAGNESIUM: MAGNESIUM: 2 mg/dL (ref 1.7–2.4)

## 2016-05-01 MED ORDER — METOPROLOL TARTRATE 25 MG PO TABS
25.0000 mg | ORAL_TABLET | Freq: Two times a day (BID) | ORAL | Status: DC
  Administered 2016-05-01 (×2): 25 mg via ORAL
  Filled 2016-05-01 (×3): qty 1

## 2016-05-01 MED ORDER — AMLODIPINE BESYLATE 10 MG PO TABS
10.0000 mg | ORAL_TABLET | Freq: Every day | ORAL | Status: DC
  Administered 2016-05-01 – 2016-05-03 (×3): 10 mg via ORAL
  Filled 2016-05-01 (×3): qty 1

## 2016-05-01 MED ORDER — TORSEMIDE 20 MG PO TABS
40.0000 mg | ORAL_TABLET | Freq: Two times a day (BID) | ORAL | Status: DC
  Administered 2016-05-01 – 2016-05-02 (×3): 40 mg via ORAL
  Filled 2016-05-01 (×3): qty 2

## 2016-05-01 NOTE — Progress Notes (Signed)
CARDIAC REHAB PHASE I   PRE:  Rate/Rhythm: 68 afib  BP:  Supine:   Sitting: 152/79  Standing:    SaO2: 95%RA  MODE:  Ambulation: 220 ft   POST:  Rate/Rhythm: 80 afib  BP:  Supine:   Sitting: 147/38  Standing:    SaO2: 93%RA 1345-1423 Pt had spilled urinal so helped pt with clean gown. Pt walked 220 ft on RA with gait belt use, rolling walker and asst x 2. Gait fairly steady. Can be asst x1. Pt stated he has 14 stairs to get to main living area. He does 7 and then rests and does 7 more. Would recommend PT consult to evaluate home needs. Pt stated he may need tomorrow to get stronger and not be quite ready for discharge.   Luetta Nuttingharlene Loranzo Desha, RN BSN  05/01/2016 2:19 PM

## 2016-05-01 NOTE — Progress Notes (Signed)
1 Day Post-Op Procedure(s) (LRB): TRANSCATHETER AORTIC VALVE REPLACEMENT, TRANSFEMORAL (N/A) TRANSESOPHAGEAL ECHOCARDIOGRAM (TEE) (N/A) Subjective:  No complaints. Throat is better. Slept well  Objective: Vital signs in last 24 hours: Temp:  [97.8 F (36.6 C)-98.8 F (37.1 C)] 98.6 F (37 C) (10/25 0730) Pulse Rate:  [0-288] 68 (10/25 0600) Cardiac Rhythm: Sinus bradycardia (10/25 0400) Resp:  [11-23] 16 (10/25 0600) BP: (98-150)/(45-60) 142/50 (10/25 0600) SpO2:  [92 %-100 %] 97 % (10/25 0600) Arterial Line BP: (138-178)/(36-53) 169/47 (10/24 1530) Weight:  [79.9 kg (176 lb 2.4 oz)] 79.9 kg (176 lb 2.4 oz) (10/25 0500)  Hemodynamic parameters for last 24 hours:    Intake/Output from previous day: 10/24 0701 - 10/25 0700 In: 3276.4 [P.O.:840; I.V.:1986.4; IV Piggyback:450] Out: 2480 [Urine:2380; Blood:100] Intake/Output this shift: No intake/output data recorded.  General appearance: alert and cooperative Neurologic: intact Heart: regular rate and rhythm, S1, S2 normal, no murmur, click, rub or gallop Lungs: diminished breath sounds bibasilar Extremities: edema mild Wound: groin access sites ok  Lab Results:  Recent Labs  04/30/16 1015 04/30/16 1036 05/01/16 0345  WBC 3.2*  --  7.4  HGB 10.8* 10.5* 10.4*  HCT 33.1* 31.0* 31.6*  PLT 106*  --  98*   BMET:  Recent Labs  04/30/16 0929 04/30/16 1036 05/01/16 0345  NA 137 139 139  K 3.6 3.7 3.8  CL 97*  --  101  CO2  --   --  29  GLUCOSE 139* 142* 96  BUN 16  --  9  CREATININE 0.70  --  0.79  CALCIUM  --   --  8.7*    PT/INR:  Recent Labs  04/30/16 1015  LABPROT 15.7*  INR 1.25   ABG    Component Value Date/Time   PHART 7.434 04/30/2016 1039   HCO3 29.3 (H) 04/30/2016 1039   TCO2 31 04/30/2016 1039   O2SAT 95.0 04/30/2016 1039   CBG (last 3)   Recent Labs  05/01/16 0014 05/01/16 0335 05/01/16 0725  GLUCAP 94 90 105*   CXR: chronically elevated left hemidiaphragm, bibasilar  atelectasis.  Assessment/Plan: S/P Procedure(s) (LRB): TRANSCATHETER AORTIC VALVE REPLACEMENT, TRANSFEMORAL (N/A) TRANSESOPHAGEAL ECHOCARDIOGRAM (TEE) (N/A)  He is hemodynamically stable in sinus rhythm with chronic LBBB. HTN: resume prior meds per cardiology. ASA and Plavix for valve IS and ambulation 2D echo today.    LOS: 1 day    Alleen BorneBryan K Arth Nicastro 05/01/2016

## 2016-05-01 NOTE — Progress Notes (Signed)
     SUBJECTIVE: No chest pain. Mild dyspnea overnight.   Tele: sinus  BP (!) 150/55   Pulse 65   Temp 98.8 F (37.1 C) (Oral)   Resp 18   Wt 176 lb 2.4 oz (79.9 kg)   SpO2 97%   BMI 28.43 kg/m   Intake/Output Summary (Last 24 hours) at 05/01/16 0644 Last data filed at 05/01/16 0500  Gross per 24 hour  Intake          3204.43 ml  Output             2305 ml  Net           899.43 ml    PHYSICAL EXAM General: Well developed, well nourished, in no acute distress. Alert and oriented x 3.  Psych:  Good affect, responds appropriately Neck: No JVD. No masses noted.  Lungs: Clear bilaterally with no wheezes or rhonci noted.  Heart: RRR with soft systolic murmur noted. Abdomen: Bowel sounds are present. Soft, non-tender.  Extremities: No lower extremity edema. Bilateral groin sites ok without hematoma  LABS: Basic Metabolic Panel:  Recent Labs  04/54/0910/24/17 0929 04/30/16 1036 05/01/16 0345  NA 137 139 139  K 3.6 3.7 3.8  CL 97*  --  101  CO2  --   --  29  GLUCOSE 139* 142* 96  BUN 16  --  9  CREATININE 0.70  --  0.79  CALCIUM  --   --  8.7*  MG  --   --  2.0   CBC:  Recent Labs  04/30/16 1015 04/30/16 1036 05/01/16 0345  WBC 3.2*  --  7.4  HGB 10.8* 10.5* 10.4*  HCT 33.1* 31.0* 31.6*  MCV 93.0  --  94.3  PLT 106*  --  98*    Current Meds: . acetaminophen  1,000 mg Oral Q6H   Or  . acetaminophen (TYLENOL) oral liquid 160 mg/5 mL  1,000 mg Per Tube Q6H  . amLODipine  5 mg Oral Daily  . aspirin EC  81 mg Oral QPM  . atorvastatin  40 mg Oral q1800  . cefUROXime (ZINACEF)  IV  1.5 g Intravenous Q12H  . clopidogrel  75 mg Oral Daily  . famotidine (PEPCID) IV  20 mg Intravenous Q12H  . gabapentin  300 mg Oral QHS  . hydrALAZINE  20 mg Oral Q8H  . insulin aspart  0-24 Units Subcutaneous Q4H  . insulin regular  0-10 Units Intravenous TID WC  . metoprolol tartrate  12.5 mg Oral BID   Or  . metoprolol tartrate  12.5 mg Per Tube BID  . [START ON 05/02/2016]  pantoprazole  40 mg Oral Daily  . torsemide  20 mg Oral Daily     ASSESSMENT AND PLAN:  1. Severe aortic valve stenosis: POD #1 s/p TAVR. He is doing well this am. Will continue ASA and Plavix. Increase metoprolol to 25 mg po BID for better BP control (home dose is 100 mg BID but given bradycardia, he may not tolerate that dose). Will increase Norvasc to 10 mg daily.  Continue hydralazine. Resume home dose of Torsemide.   D/c foley catheter. Out of bed to chair. If stable, can transfer to telemetry unit  [facility] IncChristopher Zaydon [name]  10/25/20176:44 AM

## 2016-05-01 NOTE — Progress Notes (Signed)
  Echocardiogram 2D Echocardiogram limited has been performed.  Nolon RodBrown, Tony 05/01/2016, 11:26 AM

## 2016-05-02 ENCOUNTER — Other Ambulatory Visit: Payer: Self-pay | Admitting: *Deleted

## 2016-05-02 DIAGNOSIS — Z952 Presence of prosthetic heart valve: Secondary | ICD-10-CM

## 2016-05-02 DIAGNOSIS — I350 Nonrheumatic aortic (valve) stenosis: Secondary | ICD-10-CM

## 2016-05-02 LAB — TYPE AND SCREEN
ABO/RH(D): A POS
ANTIBODY SCREEN: NEGATIVE
UNIT DIVISION: 0
Unit division: 0

## 2016-05-02 LAB — POCT I-STAT, CHEM 8
BUN: 16 mg/dL (ref 6–20)
CALCIUM ION: 1.16 mmol/L (ref 1.15–1.40)
Chloride: 98 mmol/L — ABNORMAL LOW (ref 101–111)
Creatinine, Ser: 1.2 mg/dL (ref 0.61–1.24)
Glucose, Bld: 114 mg/dL — ABNORMAL HIGH (ref 65–99)
HEMATOCRIT: 37 % — AB (ref 39.0–52.0)
HEMOGLOBIN: 12.6 g/dL — AB (ref 13.0–17.0)
Potassium: 3.3 mmol/L — ABNORMAL LOW (ref 3.5–5.1)
SODIUM: 137 mmol/L (ref 135–145)
TCO2: 31 mmol/L (ref 0–100)

## 2016-05-02 LAB — GLUCOSE, CAPILLARY
GLUCOSE-CAPILLARY: 181 mg/dL — AB (ref 65–99)
Glucose-Capillary: 122 mg/dL — ABNORMAL HIGH (ref 65–99)
Glucose-Capillary: 139 mg/dL — ABNORMAL HIGH (ref 65–99)
Glucose-Capillary: 201 mg/dL — ABNORMAL HIGH (ref 65–99)

## 2016-05-02 LAB — POCT I-STAT 3, ART BLOOD GAS (G3+)
ACID-BASE EXCESS: 7 mmol/L — AB (ref 0.0–2.0)
BICARBONATE: 32.7 mmol/L — AB (ref 20.0–28.0)
O2 SAT: 100 %
PCO2 ART: 48 mmHg (ref 32.0–48.0)
PH ART: 7.441 (ref 7.350–7.450)
PO2 ART: 425 mmHg — AB (ref 83.0–108.0)
TCO2: 34 mmol/L (ref 0–100)

## 2016-05-02 MED ORDER — METOPROLOL TARTRATE 100 MG PO TABS
100.0000 mg | ORAL_TABLET | Freq: Two times a day (BID) | ORAL | Status: DC
  Administered 2016-05-02 – 2016-05-03 (×2): 100 mg via ORAL
  Filled 2016-05-02 (×2): qty 1

## 2016-05-02 MED ORDER — INSULIN ASPART 100 UNIT/ML ~~LOC~~ SOLN
0.0000 [IU] | Freq: Three times a day (TID) | SUBCUTANEOUS | Status: DC
  Administered 2016-05-02 – 2016-05-03 (×2): 2 [IU] via SUBCUTANEOUS

## 2016-05-02 MED ORDER — INSULIN ASPART 100 UNIT/ML ~~LOC~~ SOLN: 0.0000 [IU] | Freq: Three times a day (TID) | SUBCUTANEOUS | Status: DC

## 2016-05-02 MED ORDER — INSULIN ASPART 100 UNIT/ML ~~LOC~~ SOLN
0.0000 [IU] | SUBCUTANEOUS | Status: DC
  Administered 2016-05-02: 8 [IU] via SUBCUTANEOUS
  Administered 2016-05-02: 4 [IU] via SUBCUTANEOUS

## 2016-05-02 NOTE — Consult Note (Signed)
            [facility]  05/02/2016  Earle GellBenjamin W [facility] 10-20-1927 [id number]   Patient seen at the bedside identify possible discharge needs.  Patient states that he "felt very weak and can't do anything much" which had led to this admission. Plan for discharge is to go home per patient.  Patient confirms that primary care provider [name] Patient verbalized being independent with self care prior to admission and is able to drive. Transportation to doctors' appointments can be provided by himself or daughter if needed as stated by patient.  Patient states using [facility]([place]) to obtain medications that he immediately needs and [facility] (Mail delivery service) for regular prescriptions with no difficulty. He states doing his own medication management at home (containers lined up on a tray in the bathroom). Daughter Misty Stanley(Lisa T.) and wife Maralyn Sago(Sarah) will be the primary caregivers at home if needed.  Patient had voiced understanding to call primary care provider's office once discharged, for a post discharge follow-up appointment within a week or sooner if needs arise.  Patient letter provided as a reminder.  Patient denies current issues or concerns as well as denies any needs for disease management at this time.    For additional questions please contact:  Karin GoldenLorraine A. Timya Trimmer, BSN, RN-BC [facility] PR[facility] Navigator Cell: [phone]

## 2016-05-02 NOTE — Progress Notes (Signed)
SUBJECTIVE: No chest pain or dyspnea.   Tele: sinus with frequent PVCs.   BP (!) 161/94 (BP Location: Left Arm)   Pulse 64   Temp 98.6 F (37 C) (Oral)   Resp 20   Wt (!) 366 lb 13.5 oz (166.4 kg)   SpO2 96%   BMI 59.21 kg/m   Intake/Output Summary (Last 24 hours) at 05/02/16 0813 Last data filed at 05/02/16 0644  Gross per 24 hour  Intake            361.5 ml  Output             3516 ml  Net          -3154.5 ml    PHYSICAL EXAM General: Well developed, well nourished, in no acute distress. Alert and oriented x 3.  Psych:  Good affect, responds appropriately Neck: No JVD. No masses noted.  Lungs: Clear bilaterally with no wheezes or rhonci noted.  Heart: Irregular with soft systolic murmurs noted. Abdomen: Bowel sounds are present. Soft, non-tender.  Extremities: No lower extremity edema.   LABS: Basic Metabolic Panel:  Recent Labs  40/98/11 0929 04/30/16 1036 05/01/16 0345  NA 137 139 139  K 3.6 3.7 3.8  CL 97*  --  101  CO2  --   --  29  GLUCOSE 139* 142* 96  BUN 16  --  9  CREATININE 0.70  --  0.79  CALCIUM  --   --  8.7*  MG  --   --  2.0   CBC:  Recent Labs  04/30/16 1015 04/30/16 1036 05/01/16 0345  WBC 3.2*  --  7.4  HGB 10.8* 10.5* 10.4*  HCT 33.1* 31.0* 31.6*  MCV 93.0  --  94.3  PLT 106*  --  98*   Current Meds: . acetaminophen  1,000 mg Oral Q6H   Or  . acetaminophen (TYLENOL) oral liquid 160 mg/5 mL  1,000 mg Per Tube Q6H  . amLODipine  10 mg Oral Daily  . aspirin EC  81 mg Oral QPM  . atorvastatin  40 mg Oral q1800  . cefUROXime (ZINACEF)  IV  1.5 g Intravenous Q12H  . clopidogrel  75 mg Oral Daily  . gabapentin  300 mg Oral QHS  . hydrALAZINE  20 mg Oral Q8H  . insulin aspart  0-24 Units Subcutaneous Q4H  . metoprolol tartrate  25 mg Oral BID  . pantoprazole  40 mg Oral Daily  . torsemide  40 mg Oral BID   Echo 05/01/16: - Left ventricle: The cavity size was normal. There was moderate   concentric hypertrophy.  Systolic function was mildly reduced. The   estimated ejection fraction was in the range of 45% to 50%.   Images were inadequate for LV wall motion assessment. There was   an increased relative contribution of atrial contraction to   ventricular filling. Doppler parameters are consistent with   abnormal left ventricular relaxation (grade 1 diastolic   dysfunction). - Ventricular septum: Septal motion showed moderate paradox. These   changes are consistent with intraventricular conduction delay. - Aortic valve: S/P TAVR with Sapien 3 bioprosthesis with normal   function. There is no perivalvular leak. A bioprosthesis was   present. Mean gradient (S): 10 mm Hg. Valve area (VTI): 2.48   cm^2. Valve area (Vmax): 2.22 cm^2. Valve area (Vmean): 2.46   cm^2. - Mitral valve: There was mild regurgitation. - Pulmonic valve: There was trivial regurgitation.  ASSESSMENT AND PLAN:  1. Severe aortic valve stenosis: POD #2 s/p TAVR. He is doing well this am. Echo with normally functioning bioprosthetic aortic valve. Will continue ASA and Plavix.   2. HTN: BP still elevated. HR elevated with PVCs. Will increase metoprolol to 100 mg po BID (home dose). EKG in am.   Likely home tomorrow  Melvin CarrowChristopher Taytum [name]  10/26/20178:13 AM

## 2016-05-02 NOTE — Evaluation (Signed)
Physical Therapy Evaluation/ Discharge Patient Details Name: Melvin [name] MRN: [id number] DOB: [birth year] Today's Date: 05/02/2016   History of Present Illness  82 yo admitted for TAVR. PMHx: HTN, HLD, DM, PVD, CAD, CABG, macular degeneration  Clinical Impression  Pt very pleasant and moving well. Pt able to maintain balance and speed with gait without use of AD. Pt walked 500' without significant SOB and HR 115. Pt reports walking further currently then he did prior to Sx and that he is near is baseline although he feels a little weak despite 5/5 bil LE myotome testing. Encouraged ambulation at least 3x/day and continued cardiac rehab but do not feel further P.T. Warranted at this time with pt aware and in agreement. Will sign off.   BP 143/50 sats 90-92% on RA HR 109-115 with activity    Follow Up Recommendations No PT follow up    Equipment Recommendations  None recommended by PT    Recommendations for Other Services       Precautions / Restrictions Precautions Precautions: Fall      Mobility  Bed Mobility               General bed mobility comments: pt in chair on arrival  Transfers Overall transfer level: Modified independent                  Ambulation/Gait Ambulation/Gait assistance: Modified independent (Device/Increase time) Ambulation Distance (Feet): 500 Feet Assistive device: None Gait Pattern/deviations: Step-through pattern;Decreased stride length;Wide base of support   Gait velocity interpretation: at or above normal speed for age/gender General Gait Details: pt with slightly decreased dorsiflexion on RLE but able to maintain balance with head turns in hallway and steady speed  Stairs Stairs: Yes Stairs assistance: Modified independent (Device/Increase time) Stair Management: Alternating pattern;Forwards;One rail Left Number of Stairs: 14    Wheelchair Mobility    Modified Rankin (Stroke Patients Only)       Balance                                              Pertinent Vitals/Pain Pain Assessment: No/denies pain    Home Living Family/patient expects to be discharged to:: Private residence Living Arrangements: Spouse/significant other Available Help at Discharge: Family;Available 24 hours/day Type of Home: House Home Access: Stairs to enter Entrance Stairs-Rails: Left Entrance Stairs-Number of Steps: 14 Home Layout: Two level (split foyer) Home Equipment: Cane - single point;Shower seat      Prior Function Level of Independence: Independent               Hand Dominance        Extremity/Trunk Assessment   Upper Extremity Assessment: Overall WFL for tasks assessed           Lower Extremity Assessment: Overall WFL for tasks assessed (5/5 strength bil LE)      Cervical / Trunk Assessment: Kyphotic  Communication   Communication: HOH  Cognition Arousal/Alertness: Awake/alert Behavior During Therapy: WFL for tasks assessed/performed Overall Cognitive Status: Within Functional Limits for tasks assessed                      General Comments      Exercises     Assessment/Plan    PT Assessment Patent does not need any further PT services  PT Problem List  PT Treatment Interventions      PT Goals (Current goals can be found in the Care Plan section)  Acute Rehab PT Goals PT Goal Formulation: All assessment and education complete, DC therapy    Frequency     Barriers to discharge        Co-evaluation               End of Session Equipment Utilized During Treatment: Gait belt Activity Tolerance: Patient tolerated treatment well Patient left: in chair;with call bell/phone within reach;with family/visitor present Nurse Communication: Mobility status         Time: [id number] PT Time Calculation (min) (ACUTE ONLY): 20 min   Charges:   PT Evaluation $PT Eval Moderate Complexity: 1 Procedure     PT G CodesDelorse Lek:         Tabor, Kadi Hession Beth 05/02/2016, 2:35 PM Delaney MeigsMaija Tabor Levie Wages, PT [phone]

## 2016-05-02 NOTE — Progress Notes (Addendum)
[address]       [place] [id number]             [phone]      2 Days Post-Op Procedure(s) (LRB): TRANSCATHETER AORTIC VALVE REPLACEMENT, TRANSFEMORAL (N/A) TRANSESOPHAGEAL ECHOCARDIOGRAM (TEE) (N/A) Subjective: Feels well  Objective: Vital signs in last 24 hours: Temp:  [97.8 F (36.6 C)-99.2 F (37.3 C)] 98.6 F (37 C) (10/26 0340) Pulse Rate:  [62-79] 64 (10/26 0340) Cardiac Rhythm: Atrial fibrillation (10/26 0340) Resp:  [14-25] 20 (10/26 0340) BP: (128-168)/(48-94) 161/94 (10/26 0340) SpO2:  [90 %-97 %] 96 % (10/26 0340) Weight:  [366 lb 13.5 oz (166.4 kg)] 366 lb 13.5 oz (166.4 kg) (10/26 0340)  Hemodynamic parameters for last 24 hours:    Intake/Output from previous day: 10/25 0701 - 10/26 0700 In: 417.5 [P.O.:360; I.V.:7.5; IV Piggyback:50] Out: 3596 [Urine:3595; Stool:1] Intake/Output this shift: No intake/output data recorded.  General appearance: alert, cooperative and no distress Heart: irregularly irregular rhythm and 2/6 systolic murmur Lungs: clear to auscultation bilaterally Abdomen: benign Extremities: no edema Wound: groin sites  without hematoma  Lab Results:  Recent Labs  04/30/16 1015 04/30/16 1036 05/01/16 0345  WBC 3.2*  --  7.4  HGB 10.8* 10.5* 10.4*  HCT 33.1* 31.0* 31.6*  PLT 106*  --  98*   BMET:  Recent Labs  04/30/16 0929 04/30/16 1036 05/01/16 0345  NA 137 139 139  K 3.6 3.7 3.8  CL 97*  --  101  CO2  --   --  29  GLUCOSE 139* 142* 96  BUN 16  --  9  CREATININE 0.70  --  0.79  CALCIUM  --   --  8.7*    PT/INR:  Recent Labs  04/30/16 1015  LABPROT 15.7*  INR 1.25   ABG    Component Value Date/Time   PHART 7.434 04/30/2016 1039   HCO3 29.3 (H) 04/30/2016 1039   TCO2 31 04/30/2016 1039   O2SAT 95.0 04/30/2016 1039   CBG (last 3)   Recent Labs  05/01/16 2342 05/02/16 0339 05/02/16 0743  GLUCAP 155* 122* 139*    Meds Scheduled Meds: . acetaminophen  1,000 mg Oral Q6H     Or  . acetaminophen (TYLENOL) oral liquid 160 mg/5 mL  1,000 mg Per Tube Q6H  . amLODipine  10 mg Oral Daily  . aspirin EC  81 mg Oral QPM  . atorvastatin  40 mg Oral q1800  . cefUROXime (ZINACEF)  IV  1.5 g Intravenous Q12H  . clopidogrel  75 mg Oral Daily  . gabapentin  300 mg Oral QHS  . hydrALAZINE  20 mg Oral Q8H  . insulin aspart  0-24 Units Subcutaneous Q4H  . metoprolol tartrate  25 mg Oral BID  . pantoprazole  40 mg Oral Daily  . torsemide  40 mg Oral BID   Continuous Infusions: . dexmedetomidine    . phenylephrine (NEO-SYNEPHRINE) Adult infusion     PRN Meds:.lactated ringers, metoprolol, midazolam, morphine injection, ondansetron (ZOFRAN) IV, oxyCODONE, traMADol  Xrays Dg Chest Port 1 View  Result Date: 05/01/2016 CLINICAL DATA:  Status post aortic valve repair EXAM: PORTABLE CHEST 1 VIEW COMPARISON:  04/30/2016 FINDINGS: Cardiomegaly again noted. Aortic valve prosthesis is unchanged in position. Stable right IJ central line position. Central mild vascular congestion without convincing pulmonary edema. Persistent small left pleural effusion left basilar atelectasis or infiltrate. IMPRESSION: Aortic valve prosthesis is unchanged in position. Stable right IJ central line  position. Central mild vascular congestion without convincing pulmonary edema. Persistent small left pleural effusion left basilar atelectasis or infiltrate. Electronically Signed   By: Natasha Mead M.D.   On: 05/01/2016 09:09   Dg Chest Port 1 View  Result Date: 04/30/2016 CLINICAL DATA:  Postop T AVR EXAM: PORTABLE CHEST 1 VIEW COMPARISON:  04/26/2016 FINDINGS: Changes of aortic valve repair. Prior CABG. Right central line tip is in the SVC. No pneumothorax. There is cardiomegaly. Left lower lobe atelectasis or infiltrate. Possible small left effusion. No confluent opacity on the right. IMPRESSION: Right central line tip in the SVC.  No pneumothorax. Left lower lobe atelectasis or infiltrate. Cardiomegaly.  Electronically Signed   By: Charlett Nose M.D.   On: 04/30/2016 10:47    Assessment/Plan: S/P Procedure(s) (LRB): TRANSCATHETER AORTIC VALVE REPLACEMENT, TRANSFEMORAL (N/A) TRANSESOPHAGEAL ECHOCARDIOGRAM (TEE) (N/A)  1 doing well 2 LBBB- chronic-/ BP is elevated  cardiol is adjusting meds,  3 routine rehab 4 poss home in am   LOS: 2 days    GOLD,WAYNE E 05/02/2016  He is doing well overall. Echo yesterday looks good with no paravalvular leak and low mean gradient measured at 10. Continue mobilization.

## 2016-05-02 NOTE — Progress Notes (Signed)
CARDIAC REHAB PHASE I   PRE:  Rate/Rhythm: 88 afib  BP:  Supine: 151/48  Sitting:   Standing:    SaO2: 95%RA  MODE:  Ambulation: 440 ft   POST:  Rate/Rhythm: 98 afib  BP:  Supine:   Sitting: 147/60  Standing:    SaO2: 97%RA [phone] Requested PT consult as pt has 14 stairs to get to living space. He walked 440 ft on RA with rolling walker and gait belt use and minimal asst. Tolerated increase in distance well. To recliner with call bell.   Melvin Nuttingharlene Gurnoor Sloop, RN BSN  05/02/2016 9:41 AM

## 2016-05-03 DIAGNOSIS — E876 Hypokalemia: Secondary | ICD-10-CM

## 2016-05-03 LAB — GLUCOSE, CAPILLARY
GLUCOSE-CAPILLARY: 142 mg/dL — AB (ref 65–99)
Glucose-Capillary: 135 mg/dL — ABNORMAL HIGH (ref 65–99)

## 2016-05-03 LAB — BASIC METABOLIC PANEL
ANION GAP: 8 (ref 5–15)
BUN: 10 mg/dL (ref 6–20)
CO2: 30 mmol/L (ref 22–32)
Calcium: 8.8 mg/dL — ABNORMAL LOW (ref 8.9–10.3)
Chloride: 102 mmol/L (ref 101–111)
Creatinine, Ser: 0.89 mg/dL (ref 0.61–1.24)
GFR calc Af Amer: >60 mL/min (ref >60)
GLUCOSE: 132 mg/dL — AB (ref 65–99)
POTASSIUM: 3.1 mmol/L — AB (ref 3.5–5.1)
Sodium: 140 mmol/L (ref 135–145)

## 2016-05-03 MED ORDER — POTASSIUM CHLORIDE CRYS ER 20 MEQ PO TBCR: 20.0000 meq | EXTENDED_RELEASE_TABLET | Freq: Every day | ORAL | Status: DC

## 2016-05-03 MED ORDER — POTASSIUM CHLORIDE CRYS ER 20 MEQ PO TBCR
60.0000 meq | EXTENDED_RELEASE_TABLET | Freq: Once | ORAL | Status: AC
Start: 2016-05-03 — End: 2016-05-03
  Administered 2016-05-03: 60 meq via ORAL
  Filled 2016-05-03: qty 3

## 2016-05-03 MED ORDER — POTASSIUM CHLORIDE CRYS ER 20 MEQ PO TBCR: 20.0000 meq | EXTENDED_RELEASE_TABLET | Freq: Every day | ORAL | 12 refills | Status: AC

## 2016-05-03 MED ORDER — TORSEMIDE 20 MG PO TABS: 40.0000 mg | ORAL_TABLET | Freq: Two times a day (BID) | ORAL | 12 refills | Status: DC

## 2016-05-03 MED ORDER — AMLODIPINE BESYLATE 10 MG PO TABS
10.0000 mg | ORAL_TABLET | Freq: Every day | ORAL | 12 refills | Status: AC
Start: 2016-05-03 — End: ?

## 2016-05-03 MED FILL — Insulin Regular (Human) Inj 100 Unit/ML: INTRAMUSCULAR | Qty: 250 | Status: AC

## 2016-05-03 NOTE — Progress Notes (Signed)
[phone] Education completed with pt who voiced understanding. Encouraged IS and walking for ex. Discussed CRP 2 and will refer to [place]. Encouraged low sodium. Left diabetic diet for reference. A1C at 7.1. Luetta Nuttingharlene Olumide Dolinger RN BSN 05/03/2016 9:49 AM

## 2016-05-03 NOTE — Care Management Note (Signed)
Case Management Note Donn PieriniKristi Alee Gressman RN, BSN Unit 2W-Case Manager [phone]  Patient Details  Name: Melvin LappingBenjamin W [name] MRN: [id number] Date of Birth: [birth year]  Subjective/Objective:   Pt admitted s/p AVR                 Action/Plan: PTA pt lived at home with wife- PT eval - no recommendations for any d/c follow up- plan to return home with wife-   Expected Discharge Date:    05/03/16              Expected Discharge Plan:  Home/Self Care  In-House Referral:     Discharge planning Services  CM Consult  Post Acute Care Choice:  NA Choice offered to:  NA  DME Arranged:    DME Agency:     HH Arranged:    HH Agency:     Status of Service:  Completed, signed off  If discussed at [employer] Length of Stay Meetings, dates discussed:    Additional Comments:  Darrold SpanWebster, Alyas Creary Hall, RN 05/03/2016, 9:48 AM

## 2016-05-03 NOTE — Progress Notes (Addendum)
     SUBJECTIVE: No chest pain or sob  Tele: sinus, PVCs  BP (!) 150/74 (BP Location: Left Arm)   Pulse 66   Temp 98.4 F (36.9 C) (Oral)   Resp 18   Wt 162 lb 1.6 oz (73.5 kg)   SpO2 96%   BMI 26.16 kg/m   Intake/Output Summary (Last 24 hours) at 05/03/16 0656 Last data filed at 05/03/16 [id number]  Gross per 24 hour  Intake              960 ml  Output             3650 ml  Net            -2690 ml    PHYSICAL EXAM General: Well developed, well nourished, in no acute distress. Alert and oriented x 3.  Psych:  Good affect, responds appropriately Neck: No JVD. No masses noted.  Lungs: Clear bilaterally with no wheezes or rhonci noted.  Heart: RRR with ectopy, soft systolic murmur.  Abdomen: Bowel sounds are present. Soft, non-tender.  Extremities: No lower extremity edema. No hematoma bilateral groin access sites, soft.   LABS: Basic Metabolic Panel:  Recent Labs  96/10/5408/25/17 0345 05/03/16 0206  NA 139 140  K 3.8 3.1*  CL 101 102  CO2 29 30  GLUCOSE 96 132*  BUN 9 10  CREATININE 0.79 0.89  CALCIUM 8.7* 8.8*  MG 2.0  --    CBC:  Recent Labs  04/30/16 1015  04/30/16 1252 05/01/16 0345  WBC 3.2*  --   --  7.4  HGB 10.8*  < > 12.6* 10.4*  HCT 33.1*  < > 37.0* 31.6*  MCV 93.0  --   --  94.3  PLT 106*  --   --  98*  < > = values in this interval not displayed.  Current Meds: . acetaminophen  1,000 mg Oral Q6H   Or  . acetaminophen (TYLENOL) oral liquid 160 mg/5 mL  1,000 mg Per Tube Q6H  . amLODipine  10 mg Oral Daily  . aspirin EC  81 mg Oral QPM  . atorvastatin  40 mg Oral q1800  . clopidogrel  75 mg Oral Daily  . gabapentin  300 mg Oral QHS  . hydrALAZINE  20 mg Oral Q8H  . insulin aspart  0-24 Units Subcutaneous TID AC & HS  . metoprolol tartrate  100 mg Oral BID  . pantoprazole  40 mg Oral Daily  . torsemide  40 mg Oral BID     ASSESSMENT AND PLAN:  1. Severe aortic valve stenosis: POD #3 s/p TAVR. He is doing well this am. Echo with normally  functioning bioprosthetic aortic valve. Will continue ASA and Plavix.   2. HTN: BP is better controlled. Continue current therapy. We increased Norvasc to 10 mg daily.   3. Hypokalemia: Will replace this am. Start Kdur 20 meq daily. Repeat BMET one week.   4. PAF: Several episodes of atrial fib post op. It appears that he is in sinus this am. Given advanced age, will only start anti-coagulation if he has recurrence. Will check EKG this am before discharge.   Discharge home today. Follow up in our office in one week with office APP and f/u with me in one month with echo.    Melvin [name]  10/27/20176:56 AM

## 2016-05-03 NOTE — Discharge Summary (Signed)
Discharge Summary    Patient ID: Melvin [name],  MRN: [id number], DOB/AGE: 1927/09/16 82 y.o.  Admit date: 04/30/2016 Discharge date: 05/03/2016  Primary Care Provider: [facility] Primary Cardiologist: Dr. Tresa Endo  Discharge Diagnoses    Active Problems:   Severe aortic valve stenosis   Allergies Allergies  Allergen Reactions  . Other     Patient is allergic to something but doesn't know the name.  . Pramipexole Swelling    Diagnostic Studies/Procedures   Transthoracic Echocardiography 05/01/16 - post TAVR Study Conclusions  - Left ventricle: The cavity size was normal. There was moderate   concentric hypertrophy. Systolic function was mildly reduced. The   estimated ejection fraction was in the range of 45% to 50%.   Images were inadequate for LV wall motion assessment. There was   an increased relative contribution of atrial contraction to   ventricular filling. Doppler parameters are consistent with   abnormal left ventricular relaxation (grade 1 diastolic   dysfunction). - Ventricular septum: Septal motion showed moderate paradox. These   changes are consistent with intraventricular conduction delay. - Aortic valve: S/P TAVR with Sapien 3 bioprosthesis with normal   function. There is no perivalvular leak. A bioprosthesis was   present. Mean gradient (S): 10 mm Hg. Valve area (VTI): 2.48   cm^2. Valve area (Vmax): 2.22 cm^2. Valve area (Vmean): 2.46   cm^2. - Mitral valve: There was mild regurgitation. - Pulmonic valve: There was trivial regurgitation  _____________   History of Present Illness   82 yo male with history of HTN, HLD, Diabetes, CAD s/p CABG and prior PCI, ischemic cardiomyopathy, diabetes, PAD with prior lower extremity interventions, LBBB and PVCS and known aortic stenosis who was referred to valve clinic for further discussion regarding his aortic stenosis.   He is followed in our office by Dr. Tresa Endo. He has a long history  of CAD dating back to 60 when he had PTCA of his LAD. He had 5V CABG in 1998. In March of 2016 he had worsened fatigue and dyspnea and repeat cardiac cath showed patent LIMA to LAD, patent SVG to Diagonal, patent sequential SVG to PDA and PLA with severe disease in the SVG to OM. A drug eluting stent was placed in the SVG to the obtuse marginal.   Echo May 2017 with LVEF=30-35% with moderate LVH, diffuse hypokinesis of the LV with moderate aortic stenosis with mean gradient of 22 mmHg and AVA 1.1cm2. There was concern for low output aortic stenosis. Dobutamine stress echo 03/20/16 with elevation of velocity across the aortic valve with infusion of dobutamine (3.86 m/sec with mean gradient 31 mm Hg, peak gradient 60 mmHg, AVA 1.0cm2). These findings likely represent severe aortic stenosis. He has known PAD with prior atherectomy of the left popliteal artery. His chronic systolic CHF has been managed with torsemide.    It was felt that he would benefit from TAVR as he is very functional and is not a good surgical candidate.   Hospital Course  He was admitted for TAVR on 04/30/16. He was stable post procedure.   He will continue on ASA and Plavix. His BP was elevated so Norvasc was increased to 10mg  daily.   Post operatively, he had several episodes of PAF, given his advanced age anticoagulation will only be started if PAF reoccurs.   He will be set up for an Echo in one month. He was seen today by Dr. Clifton James and deemed suitable for discharge. He has  an appt. With Dr. Tresa Endo on 05/14/16 for follow up.  _____________  Discharge Vitals Blood pressure (!) 150/74, pulse 66, temperature 98.4 F (36.9 C), temperature source Oral, resp. rate 18, weight 162 lb 1.6 oz (73.5 kg), SpO2 96 %.  Filed Weights   05/01/16 0500 05/02/16 0340 05/03/16 0601  Weight: 176 lb 2.4 oz (79.9 kg) (!) 366 lb 13.5 oz (166.4 kg) 162 lb 1.6 oz (73.5 kg)    Labs & Radiologic Studies     CBC  Recent Labs  05/01/16 0345   WBC 7.4  HGB 10.4*  HCT 31.6*  MCV 94.3  PLT 98*   Basic Metabolic Panel  Recent Labs  05/01/16 0345 05/03/16 0206  NA 139 140  K 3.8 3.1*  CL 101 102  CO2 29 30  GLUCOSE 96 132*  BUN 9 10  CREATININE 0.79 0.89  CALCIUM 8.7* 8.8*  MG 2.0  --     Dg Chest 2 View  Result Date: 04/26/2016 CLINICAL DATA:  Preoperative evaluation for upcoming TAVR EXAM: CHEST  2 VIEW COMPARISON:  10/30/2015 FINDINGS: Elevation of left hemidiaphragm is again identified. Postsurgical changes are again seen. Cardiac shadow is again enlarged but stable. No focal infiltrate or sizable effusion is seen. No acute bony abnormality is seen. IMPRESSION: No active cardiopulmonary disease. Electronically Signed   By: Alcide Clever M.D.   On: 04/26/2016 15:17   Ct Coronary Morph W/cta Cor W/score W/ca W/cm &/or Wo/cm  Addendum Date: 04/15/2016   ADDENDUM REPORT: 04/15/2016 16:58 CLINICAL DATA:  82 year old male with severe aortic stenosis. EXAM: Cardiac TAVR CT TECHNIQUE: The patient was scanned on a Philips 256 scanner. A 120 kV retrospective scan was triggered in the descending thoracic aorta at 111 HU's. Gantry rotation speed was 270 msecs and collimation was .9 mm. No beta blockade or nitro were given. The 3D data set was reconstructed in 5% intervals of the R-R cycle. Systolic and diastolic phases were analyzed on a dedicated work station using MPR, MIP and VRT modes. The patient received 80 cc of contrast. FINDINGS: Aortic Valve: Severely thickened and calcified with severe asymmetric calcification under the left coronary cusp extending into the LVOT. Aorta:  Normal size, no dissection, moderate diffuse calcifications. Sinotubular Junction:  37 x 32 mm Ascending Thoracic Aorta:  38 x 38 mm Aortic Arch:  30 x 28 mm Descending Thoracic Aorta:  28 x 26 mm Sinus of Valsalva Measurements: Non-coronary:  39 mm Right -coronary:  37 mm Left -coronary:  37 mm Coronary Artery Height above Annulus: Left Main:  14 mm Right  Coronary:  17 mm Virtual Basal Annulus Measurements: Maximum/Minimum Diameter:  30 x 26 mm Perimeter:  86 mm Area:  580 mm2 Optimum Fluoroscopic Angle for Delivery:  RAO 1 CRA 0 IMPRESSION: 1. Severely calcified and thickened tricuspid aortic valve with severe asymmetric calcification under the left coronary cusp extending into the LVOT and annular measurements suitable for delivery of 29 mmEdwards-SAPIEN 3 valve. 2.  Sufficient coronary to annulus distance. 3. Optimum Fluoroscopic Angle for Delivery:  RAO 1 CRA 0 4.  No thrombus in the left atrial appendage. Tobias Alexander Electronically Signed   By: Tobias Alexander   On: 04/15/2016 16:58   Result Date: 04/15/2016 EXAM: OVER-READ INTERPRETATION  CT CHEST The following report is an over-read performed by radiologist Dr. Royal Piedra [facility] on 04/15/2016. This over-read does not include interpretation of cardiac or coronary anatomy or pathology. The coronary calcium score/coronary CTA interpretation by the  cardiologist is attached. COMPARISON:  None. FINDINGS: Extracardiac findings will be dictated separately under accession number for contemporaneously obtained CTA of the chest, abdomen and pelvis 04/15/2016. IMPRESSION: Please see separate dictation for contemporaneously obtained CTA of the chest, abdomen and pelvis 04/15/2016 for full description of extracardiac findings. Electronically Signed: By: Trudie Reed M.D. On: 04/15/2016 15:49   Dg Chest Port 1 View  Result Date: 05/01/2016 CLINICAL DATA:  Status post aortic valve repair EXAM: PORTABLE CHEST 1 VIEW COMPARISON:  04/30/2016 FINDINGS: Cardiomegaly again noted. Aortic valve prosthesis is unchanged in position. Stable right IJ central line position. Central mild vascular congestion without convincing pulmonary edema. Persistent small left pleural effusion left basilar atelectasis or infiltrate. IMPRESSION: Aortic valve prosthesis is unchanged in position. Stable right IJ  central line position. Central mild vascular congestion without convincing pulmonary edema. Persistent small left pleural effusion left basilar atelectasis or infiltrate. Electronically Signed   By: Natasha Mead M.D.   On: 05/01/2016 09:09   Dg Chest Port 1 View  Result Date: 04/30/2016 CLINICAL DATA:  Postop T AVR EXAM: PORTABLE CHEST 1 VIEW COMPARISON:  04/26/2016 FINDINGS: Changes of aortic valve repair. Prior CABG. Right central line tip is in the SVC. No pneumothorax. There is cardiomegaly. Left lower lobe atelectasis or infiltrate. Possible small left effusion. No confluent opacity on the right. IMPRESSION: Right central line tip in the SVC.  No pneumothorax. Left lower lobe atelectasis or infiltrate. Cardiomegaly. Electronically Signed   By: Charlett Nose M.D.   On: 04/30/2016 10:47   Ct Angio Chest Aorta W/cm &/or Wo/cm  Result Date: 04/15/2016 CLINICAL DATA:  82 year old male with history of severe aortic stenosis. Preprocedural study prior to potential transcatheter aortic valve replacement (TAVR). EXAM: CT ANGIOGRAPHY CHEST, ABDOMEN AND PELVIS TECHNIQUE: Multidetector CT imaging through the chest, abdomen and pelvis was performed using the standard protocol during bolus administration of intravenous contrast. Multiplanar reconstructed images and MIPs were obtained and reviewed to evaluate the vascular anatomy. CONTRAST:  150 mL of Isovue 370. COMPARISON:  No priors. FINDINGS: CTA CHEST FINDINGS Cardiovascular: Heart size is enlarged with left ventricular dilatation. There is no significant pericardial fluid, thickening or pericardial calcification. There is aortic atherosclerosis, as well as atherosclerosis of the great vessels of the mediastinum and the coronary arteries, including calcified atherosclerotic plaque in the left main, left anterior descending, left circumflex and right coronary arteries. Status post median sternotomy for CABG, including LIMA to the LAD. Thickening calcification of the  aortic valve. Calcification of the anterior leaflet of the mitral valve. Mediastinum/Lymph Nodes: No pathologically enlarged mediastinal or hilar lymph nodes. Esophagus is unremarkable in appearance. No axillary lymphadenopathy. Lungs/Pleura: Incomplete visualization of the lung apices. Within the visualized portions of the lungs there are no suspicious appearing pulmonary nodules or masses, there is no acute consolidative airspace disease, no pleural effusions and no pneumothorax. Areas of mild scarring are noted in the anterior aspect of the left upper lobe. Musculoskeletal/Soft Tissues: Median sternotomy wires. There are no aggressive appearing lytic or blastic lesions noted in the visualized portions of the skeleton. CTA ABDOMEN AND PELVIS FINDINGS Hepatobiliary: 1.7 cm low-attenuation lesion in segment 7 of the liver with peripheral nodular enhancement, incompletely characterized, but likely a cavernous hemangioma. Sub cm low-attenuation lesion in segment 8 too small to characterize, but likely a cyst. No intra or extrahepatic biliary ductal dilatation. Gallbladder is normal in appearance. Pancreas: No pancreatic mass. No pancreatic ductal dilatation. No pancreatic or peripancreatic fluid or inflammatory changes. Spleen: Unremarkable. Adrenals/Urinary  Tract: 2.1 cm simple cyst extending off the posterior aspect of the interpolar region of the left kidney. Other sub cm low-attenuation lesions in both kidneys are too small to definitively characterize, but are favored to represent tiny cysts. There is also a 1.4 cm high attenuation (56 HU) lesion extending exophytically from the medial aspect of the upper pole of the right kidney (image 90 of series 4). Additionally, there are 2 intermediate attenuation lesions in the lower poles of the kidneys bilaterally measuring 1.7 cm on the right (image 113 of series 4) and 1.8 cm on the left (image 96 of series 4). Bilateral adrenal glands are normal in appearance. No  hydroureteronephrosis. Urinary bladder is unremarkable in appearance. Stomach/Bowel: Normal appearance of the stomach. No pathologic dilatation of small bowel or colon. Several scattered colonic diverticulae are noted, without surrounding inflammatory changes to suggest an acute diverticulitis at this time. The appendix is not confidently identified and may be surgically absent. Regardless, there are no inflammatory changes noted adjacent to the cecum to suggest the presence of an acute appendicitis at this time. Vascular/Lymphatic: Aortic atherosclerosis, without evidence of aneurysm or dissection in the abdominal or pelvic vasculature. Vascular findings and measurements pertinent to potential TAVR procedure, as detailed below. Celiac axis, superior mesenteric artery and inferior mesenteric artery and their major branches are all widely patent, although there appears to be mild stenosis in the proximal superior mesenteric artery (less than 50% diameter stenosis). Single renal arteries bilaterally, both widely patent. No lymphadenopathy noted in the abdomen or pelvis. Reproductive: Prostate gland and seminal vesicles are unremarkable in appearance. Other: Epigastric ventral hernia containing only omental fat. No associated bowel incarceration or obstruction at this time. No significant volume of ascites. No pneumoperitoneum. Musculoskeletal: There are no aggressive appearing lytic or blastic lesions noted in the visualized portions of the skeleton. VASCULAR MEASUREMENTS PERTINENT TO TAVR: AORTA: Minimal Aortic Diameter -  16 x 15 mm Severity of Aortic Calcification -  moderate to severe RIGHT PELVIS: Right Common Iliac Artery - Minimal Diameter - 9.9 x 7.3 mm Tortuosity - mild Calcification - moderate Right External Iliac Artery - Minimal Diameter - 8.9 x 4.8 mm Tortuosity - mild Calcification - mild Right Common Femoral Artery - Minimal Diameter - 7.5 x 6.2 mm Tortuosity - mild Calcification - moderate LEFT PELVIS:  Left Common Iliac Artery - Minimal Diameter - 9.9 x 7.8 mm Tortuosity - mild Calcification - mild Left External Iliac Artery - Minimal Diameter - 8.7 x 8.5 mm Tortuosity - mild Calcification - mild Left Common Femoral Artery - Minimal Diameter - 9.3 x 9.6 mm Tortuosity - mild Calcification - mild Review of the MIP images confirms the above findings. IMPRESSION: 1. Vascular findings and measurements pertinent to potential TAVR procedure, as detailed above. This patient appears to have suitable pelvic arterial access bilaterally. 2. Thickening calcification of the aortic valve, compatible with the reported clinical history of severe aortic stenosis. 3. Cardiomegaly with left ventricular dilatation. 4. Aortic atherosclerosis, in addition to left main and 3 vessel coronary artery disease.Status post median sternotomy for CABG, including LIMA to the LAD. 5. Several indeterminate lesions in the kidneys bilaterally, favored to represent proteinaceous cysts. The lesion in the medial aspect of the upper pole of the right kidney and in the lower pole the right kidney both appear relatively stable in size compared to prior study 11/19/2011, making the likelihood of aggressive lesions very low. By comparison, the lesion in the medial aspect of the lower pole of  the left kidney is new compared to the prior examination and therefore more concerning. These are considered Bosniak class 70F lesions, and warrant follow-up evaluation with MRI of the abdomen with and without IV gadolinium in 6 months to ensure the stability of these lesions and to provide better characterization. This recommendation follows ACR consensus guidelines: Management of the Incidental Renal Mass on CT: A White Paper of the ACR Incidental Findings Committee. J Am Coll Radiol 2017; article in press. 6. Additional incidental findings, as above. Electronically Signed   By: Trudie Reed M.D.   On: 04/15/2016 17:03   Ct Angio Abd/pel W/ And/or W/o  Result  Date: 04/15/2016 CLINICAL DATA:  82 year old male with history of severe aortic stenosis. Preprocedural study prior to potential transcatheter aortic valve replacement (TAVR). EXAM: CT ANGIOGRAPHY CHEST, ABDOMEN AND PELVIS TECHNIQUE: Multidetector CT imaging through the chest, abdomen and pelvis was performed using the standard protocol during bolus administration of intravenous contrast. Multiplanar reconstructed images and MIPs were obtained and reviewed to evaluate the vascular anatomy. CONTRAST:  150 mL of Isovue 370. COMPARISON:  No priors. FINDINGS: CTA CHEST FINDINGS Cardiovascular: Heart size is enlarged with left ventricular dilatation. There is no significant pericardial fluid, thickening or pericardial calcification. There is aortic atherosclerosis, as well as atherosclerosis of the great vessels of the mediastinum and the coronary arteries, including calcified atherosclerotic plaque in the left main, left anterior descending, left circumflex and right coronary arteries. Status post median sternotomy for CABG, including LIMA to the LAD. Thickening calcification of the aortic valve. Calcification of the anterior leaflet of the mitral valve. Mediastinum/Lymph Nodes: No pathologically enlarged mediastinal or hilar lymph nodes. Esophagus is unremarkable in appearance. No axillary lymphadenopathy. Lungs/Pleura: Incomplete visualization of the lung apices. Within the visualized portions of the lungs there are no suspicious appearing pulmonary nodules or masses, there is no acute consolidative airspace disease, no pleural effusions and no pneumothorax. Areas of mild scarring are noted in the anterior aspect of the left upper lobe. Musculoskeletal/Soft Tissues: Median sternotomy wires. There are no aggressive appearing lytic or blastic lesions noted in the visualized portions of the skeleton. CTA ABDOMEN AND PELVIS FINDINGS Hepatobiliary: 1.7 cm low-attenuation lesion in segment 7 of the liver with peripheral  nodular enhancement, incompletely characterized, but likely a cavernous hemangioma. Sub cm low-attenuation lesion in segment 8 too small to characterize, but likely a cyst. No intra or extrahepatic biliary ductal dilatation. Gallbladder is normal in appearance. Pancreas: No pancreatic mass. No pancreatic ductal dilatation. No pancreatic or peripancreatic fluid or inflammatory changes. Spleen: Unremarkable. Adrenals/Urinary Tract: 2.1 cm simple cyst extending off the posterior aspect of the interpolar region of the left kidney. Other sub cm low-attenuation lesions in both kidneys are too small to definitively characterize, but are favored to represent tiny cysts. There is also a 1.4 cm high attenuation (56 HU) lesion extending exophytically from the medial aspect of the upper pole of the right kidney (image 90 of series 4). Additionally, there are 2 intermediate attenuation lesions in the lower poles of the kidneys bilaterally measuring 1.7 cm on the right (image 113 of series 4) and 1.8 cm on the left (image 96 of series 4). Bilateral adrenal glands are normal in appearance. No hydroureteronephrosis. Urinary bladder is unremarkable in appearance. Stomach/Bowel: Normal appearance of the stomach. No pathologic dilatation of small bowel or colon. Several scattered colonic diverticulae are noted, without surrounding inflammatory changes to suggest an acute diverticulitis at this time. The appendix is not confidently identified and may  be surgically absent. Regardless, there are no inflammatory changes noted adjacent to the cecum to suggest the presence of an acute appendicitis at this time. Vascular/Lymphatic: Aortic atherosclerosis, without evidence of aneurysm or dissection in the abdominal or pelvic vasculature. Vascular findings and measurements pertinent to potential TAVR procedure, as detailed below. Celiac axis, superior mesenteric artery and inferior mesenteric artery and their major branches are all widely  patent, although there appears to be mild stenosis in the proximal superior mesenteric artery (less than 50% diameter stenosis). Single renal arteries bilaterally, both widely patent. No lymphadenopathy noted in the abdomen or pelvis. Reproductive: Prostate gland and seminal vesicles are unremarkable in appearance. Other: Epigastric ventral hernia containing only omental fat. No associated bowel incarceration or obstruction at this time. No significant volume of ascites. No pneumoperitoneum. Musculoskeletal: There are no aggressive appearing lytic or blastic lesions noted in the visualized portions of the skeleton. VASCULAR MEASUREMENTS PERTINENT TO TAVR: AORTA: Minimal Aortic Diameter -  16 x 15 mm Severity of Aortic Calcification -  moderate to severe RIGHT PELVIS: Right Common Iliac Artery - Minimal Diameter - 9.9 x 7.3 mm Tortuosity - mild Calcification - moderate Right External Iliac Artery - Minimal Diameter - 8.9 x 4.8 mm Tortuosity - mild Calcification - mild Right Common Femoral Artery - Minimal Diameter - 7.5 x 6.2 mm Tortuosity - mild Calcification - moderate LEFT PELVIS: Left Common Iliac Artery - Minimal Diameter - 9.9 x 7.8 mm Tortuosity - mild Calcification - mild Left External Iliac Artery - Minimal Diameter - 8.7 x 8.5 mm Tortuosity - mild Calcification - mild Left Common Femoral Artery - Minimal Diameter - 9.3 x 9.6 mm Tortuosity - mild Calcification - mild Review of the MIP images confirms the above findings. IMPRESSION: 1. Vascular findings and measurements pertinent to potential TAVR procedure, as detailed above. This patient appears to have suitable pelvic arterial access bilaterally. 2. Thickening calcification of the aortic valve, compatible with the reported clinical history of severe aortic stenosis. 3. Cardiomegaly with left ventricular dilatation. 4. Aortic atherosclerosis, in addition to left main and 3 vessel coronary artery disease.Status post median sternotomy for CABG, including LIMA  to the LAD. 5. Several indeterminate lesions in the kidneys bilaterally, favored to represent proteinaceous cysts. The lesion in the medial aspect of the upper pole of the right kidney and in the lower pole the right kidney both appear relatively stable in size compared to prior study 11/19/2011, making the likelihood of aggressive lesions very low. By comparison, the lesion in the medial aspect of the lower pole of the left kidney is new compared to the prior examination and therefore more concerning. These are considered Bosniak class 29F lesions, and warrant follow-up evaluation with MRI of the abdomen with and without IV gadolinium in 6 months to ensure the stability of these lesions and to provide better characterization. This recommendation follows ACR consensus guidelines: Management of the Incidental Renal Mass on CT: A White Paper of the ACR Incidental Findings Committee. J Am Coll Radiol 2017; article in press. 6. Additional incidental findings, as above. Electronically Signed   By: Trudie Reed M.D.   On: 04/15/2016 17:03    Disposition   Pt is being discharged home today in good condition.  Follow-up Plans & Appointments     Discharge Instructions    Amb Referral to Cardiac Rehabilitation    Complete by:  As directed    Diagnosis:  Valve Replacement Comment - TAVR   Valve:  Aortic   Diet -  low sodium heart healthy    Complete by:  As directed    Increase activity slowly    Complete by:  As directed       Discharge Medications   Discharge Medication List as of 05/03/2016  8:56 AM    START taking these medications   Details  potassium chloride SA (K-DUR,KLOR-CON) 20 MEQ tablet Take 1 tablet (20 mEq total) by mouth daily., Starting Sat 05/04/2016, Normal      CONTINUE these medications which have CHANGED   Details  amLODipine (NORVASC) 10 MG tablet Take 1 tablet (10 mg total) by mouth daily., Starting Fri 05/03/2016, Normal    torsemide (DEMADEX) 20 MG tablet Take 2  tablets (40 mg total) by mouth 2 (two) times daily., Starting Fri 05/03/2016, Normal      CONTINUE these medications which have NOT CHANGED   Details  aspirin 81 MG tablet Take 81 mg by mouth every evening. , Historical Med    atorvastatin (LIPITOR) 40 MG tablet TAKE 1 TABLET DAILY AT 6 P.M. (KEEP OFFICE VISIT), Normal    cetirizine (ZYRTEC) 10 MG tablet Take 10 mg by mouth daily as needed for allergies. , Historical Med    clopidogrel (PLAVIX) 75 MG tablet TAKE 1 TABLET DAILY (KEEP OFFICE VISIT), Normal    gabapentin (NEURONTIN) 300 MG capsule Take 1 capsule (300 mg total) by mouth at bedtime., Starting Thu 10/26/2015, Normal    hydrALAZINE (APRESOLINE) 10 MG tablet Take 2 tablets (20 mg total) by mouth every 8 (eight) hours., Starting Wed 01/03/2016, Phone In    metformin (FORTAMET) 1000 MG (OSM) 24 hr tablet Take 1,000 mg by mouth 2 (two) times daily with a meal., Until Discontinued, Historical Med    metoprolol (LOPRESSOR) 100 MG tablet Take 1 tablet (100 mg total) by mouth 2 (two) times daily., Starting Tue 02/27/2016, Until Mon 05/27/2016, Normal    Multiple Vitamins-Minerals (PRESERVISION AREDS 2 PO) Take 1 capsule by mouth 2 (two) times daily. , Until Discontinued, Historical Med    multivitamin-iron-minerals-folic acid (CENTRUM) chewable tablet Chew 1 tablet by mouth daily., Until Discontinued, Historical Med    niacin (NIASPAN) 750 MG CR tablet Take 2 tablets (1,500 mg total) by mouth at bedtime., Starting Mon 01/22/2016, Normal    nitroGLYCERIN (NITROSTAT) 0.4 MG SL tablet Place 1 tablet (0.4 mg total) under the tongue every 5 (five) minutes as needed for chest pain., Starting Wed 11/29/2015, Normal    oxymetazoline (AFRIN) 0.05 % nasal spray Place 2 sprays into both nostrils 3 (three) times daily as needed for congestion (Allergies)., Until Discontinued, Historical Med    polycarbophil (FIBERCON) 625 MG tablet Take 1,250 mg by mouth daily. , Historical Med    zoledronic acid  (RECLAST) 5 MG/100ML SOLN Inject 100 mLs (5 mg total) into the vein once., Starting 07/01/2011, Print            Outstanding Labs/Studies     Duration of Discharge Encounter   Greater than 30 minutes including physician time.  Signed, Little Ishikawa NP 05/03/2016, 5:08 PM

## 2016-05-10 ENCOUNTER — Telehealth: Payer: Self-pay | Admitting: Cardiovascular Disease

## 2016-05-10 NOTE — Telephone Encounter (Signed)
Left msg for caller. Advised to call back Monday if this is non-urgent concern. If urgent concern for cardiology advice, recommended to return call and follow instructions for after hours pager service.

## 2016-05-10 NOTE — Telephone Encounter (Signed)
Pt daughter is calling because her father has been sick most of the day. (nauesa but not throwing up) Just concerned would like a call back

## 2016-05-11 ENCOUNTER — Inpatient Hospital Stay (HOSPITAL_COMMUNITY): Payer: Medicare Other

## 2016-05-11 ENCOUNTER — Emergency Department (HOSPITAL_COMMUNITY): Payer: Medicare Other

## 2016-05-11 ENCOUNTER — Inpatient Hospital Stay (HOSPITAL_COMMUNITY)
Admission: EM | Admit: 2016-05-11 | Discharge: 2016-05-14 | DRG: 309 | Disposition: A | Discharge status: Skilled Nursing Facility | Payer: Medicare Other | Attending: Internal Medicine | Admitting: Internal Medicine

## 2016-05-11 ENCOUNTER — Encounter (HOSPITAL_COMMUNITY)
Admission: EM | Disposition: A | Discharge status: Skilled Nursing Facility | Payer: Self-pay | Source: Home / Self Care | Attending: Internal Medicine

## 2016-05-11 ENCOUNTER — Encounter (HOSPITAL_COMMUNITY): Payer: Self-pay | Admitting: Emergency Medicine

## 2016-05-11 DIAGNOSIS — Z8249 Family history of ischemic heart disease and other diseases of the circulatory system: Secondary | ICD-10-CM | POA: Diagnosis not present

## 2016-05-11 DIAGNOSIS — I10 Essential (primary) hypertension: Secondary | ICD-10-CM | POA: Diagnosis not present

## 2016-05-11 DIAGNOSIS — Z7902 Long term (current) use of antithrombotics/antiplatelets: Secondary | ICD-10-CM

## 2016-05-11 DIAGNOSIS — E7800 Pure hypercholesterolemia, unspecified: Secondary | ICD-10-CM | POA: Diagnosis present

## 2016-05-11 DIAGNOSIS — I442 Atrioventricular block, complete: Principal | ICD-10-CM | POA: Diagnosis present

## 2016-05-11 DIAGNOSIS — I272 Pulmonary hypertension, unspecified: Secondary | ICD-10-CM | POA: Diagnosis present

## 2016-05-11 DIAGNOSIS — I110 Hypertensive heart disease with heart failure: Secondary | ICD-10-CM | POA: Diagnosis present

## 2016-05-11 DIAGNOSIS — I351 Nonrheumatic aortic (valve) insufficiency: Secondary | ICD-10-CM | POA: Diagnosis present

## 2016-05-11 DIAGNOSIS — I5042 Chronic combined systolic (congestive) and diastolic (congestive) heart failure: Secondary | ICD-10-CM | POA: Diagnosis present

## 2016-05-11 DIAGNOSIS — N179 Acute kidney failure, unspecified: Secondary | ICD-10-CM | POA: Diagnosis present

## 2016-05-11 DIAGNOSIS — H353130 Nonexudative age-related macular degeneration, bilateral, stage unspecified: Secondary | ICD-10-CM | POA: Diagnosis present

## 2016-05-11 DIAGNOSIS — I2510 Atherosclerotic heart disease of native coronary artery without angina pectoris: Secondary | ICD-10-CM | POA: Diagnosis present

## 2016-05-11 DIAGNOSIS — W1839XA Other fall on same level, initial encounter: Secondary | ICD-10-CM | POA: Diagnosis present

## 2016-05-11 DIAGNOSIS — E1151 Type 2 diabetes mellitus with diabetic peripheral angiopathy without gangrene: Secondary | ICD-10-CM | POA: Diagnosis present

## 2016-05-11 DIAGNOSIS — Z952 Presence of prosthetic heart valve: Secondary | ICD-10-CM

## 2016-05-11 DIAGNOSIS — R55 Syncope and collapse: Secondary | ICD-10-CM | POA: Diagnosis present

## 2016-05-11 DIAGNOSIS — G609 Hereditary and idiopathic neuropathy, unspecified: Secondary | ICD-10-CM | POA: Diagnosis present

## 2016-05-11 DIAGNOSIS — K219 Gastro-esophageal reflux disease without esophagitis: Secondary | ICD-10-CM | POA: Diagnosis present

## 2016-05-11 DIAGNOSIS — G2581 Restless legs syndrome: Secondary | ICD-10-CM | POA: Diagnosis present

## 2016-05-11 DIAGNOSIS — I255 Ischemic cardiomyopathy: Secondary | ICD-10-CM | POA: Diagnosis present

## 2016-05-11 DIAGNOSIS — I350 Nonrheumatic aortic (valve) stenosis: Secondary | ICD-10-CM | POA: Diagnosis present

## 2016-05-11 DIAGNOSIS — E871 Hypo-osmolality and hyponatremia: Secondary | ICD-10-CM | POA: Diagnosis present

## 2016-05-11 DIAGNOSIS — K449 Diaphragmatic hernia without obstruction or gangrene: Secondary | ICD-10-CM | POA: Diagnosis present

## 2016-05-11 DIAGNOSIS — Z955 Presence of coronary angioplasty implant and graft: Secondary | ICD-10-CM

## 2016-05-11 DIAGNOSIS — E785 Hyperlipidemia, unspecified: Secondary | ICD-10-CM | POA: Diagnosis present

## 2016-05-11 DIAGNOSIS — Z79899 Other long term (current) drug therapy: Secondary | ICD-10-CM

## 2016-05-11 DIAGNOSIS — R600 Localized edema: Secondary | ICD-10-CM | POA: Diagnosis present

## 2016-05-11 DIAGNOSIS — Z888 Allergy status to other drugs, medicaments and biological substances status: Secondary | ICD-10-CM

## 2016-05-11 DIAGNOSIS — I447 Left bundle-branch block, unspecified: Secondary | ICD-10-CM | POA: Diagnosis not present

## 2016-05-11 DIAGNOSIS — Z7982 Long term (current) use of aspirin: Secondary | ICD-10-CM

## 2016-05-11 DIAGNOSIS — Z951 Presence of aortocoronary bypass graft: Secondary | ICD-10-CM | POA: Diagnosis not present

## 2016-05-11 DIAGNOSIS — M810 Age-related osteoporosis without current pathological fracture: Secondary | ICD-10-CM | POA: Diagnosis present

## 2016-05-11 HISTORY — PX: CARDIAC CATHETERIZATION: SHX172

## 2016-05-11 LAB — CBC WITH DIFFERENTIAL/PLATELET
BASOS ABS: 0 10*3/uL (ref 0.0–0.1)
BASOS PCT: 0 %
EOS ABS: 0.1 10*3/uL (ref 0.0–0.7)
EOS PCT: 1 %
HCT: 34.6 % — ABNORMAL LOW (ref 39.0–52.0)
Hemoglobin: 11.8 g/dL — ABNORMAL LOW (ref 13.0–17.0)
LYMPHS PCT: 16 %
Lymphs Abs: 1.7 10*3/uL (ref 0.7–4.0)
MCH: 31.3 pg (ref 26.0–34.0)
MCHC: 34.1 g/dL (ref 30.0–36.0)
MCV: 91.8 fL (ref 78.0–100.0)
Monocytes Absolute: 0.9 10*3/uL (ref 0.1–1.0)
Monocytes Relative: 9 %
Neutro Abs: 7.7 10*3/uL (ref 1.7–7.7)
Neutrophils Relative %: 74 %
PLATELETS: 192 10*3/uL (ref 150–400)
RBC: 3.77 MIL/uL — AB (ref 4.22–5.81)
RDW: 14.1 % (ref 11.5–15.5)
WBC: 10.5 10*3/uL (ref 4.0–10.5)

## 2016-05-11 LAB — BASIC METABOLIC PANEL
ANION GAP: 10 (ref 5–15)
Anion gap: 8 (ref 5–15)
BUN: 28 mg/dL — ABNORMAL HIGH (ref 6–20)
BUN: 26 mg/dL — AB (ref 6–20)
CALCIUM: 9.9 mg/dL (ref 8.9–10.3)
CO2: 27 mmol/L (ref 22–32)
CO2: 27 mmol/L (ref 22–32)
CREATININE: 1.02 mg/dL (ref 0.61–1.24)
Calcium: 9 mg/dL (ref 8.9–10.3)
Chloride: 91 mmol/L — ABNORMAL LOW (ref 101–111)
Chloride: 96 mmol/L — ABNORMAL LOW (ref 101–111)
Creatinine, Ser: 1.19 mg/dL (ref 0.61–1.24)
GFR calc Af Amer: >60 mL/min (ref >60)
GFR, EST NON AFRICAN AMERICAN: 53 mL/min — AB (ref >60)
Glucose, Bld: 174 mg/dL — ABNORMAL HIGH (ref 65–99)
Glucose, Bld: 144 mg/dL — ABNORMAL HIGH (ref 65–99)
POTASSIUM: 4.9 mmol/L (ref 3.5–5.1)
Potassium: 4.5 mmol/L (ref 3.5–5.1)
SODIUM: 128 mmol/L — AB (ref 135–145)
SODIUM: 131 mmol/L — AB (ref 135–145)

## 2016-05-11 LAB — GLUCOSE, CAPILLARY
GLUCOSE-CAPILLARY: 135 mg/dL — AB (ref 65–99)
GLUCOSE-CAPILLARY: 145 mg/dL — AB (ref 65–99)
Glucose-Capillary: 164 mg/dL — ABNORMAL HIGH (ref 65–99)
Glucose-Capillary: 157 mg/dL — ABNORMAL HIGH (ref 65–99)

## 2016-05-11 LAB — CBC
HCT: 32.4 % — ABNORMAL LOW (ref 39.0–52.0)
Hemoglobin: 10.8 g/dL — ABNORMAL LOW (ref 13.0–17.0)
MCH: 30.3 pg (ref 26.0–34.0)
MCHC: 33.3 g/dL (ref 30.0–36.0)
MCV: 91 fL (ref 78.0–100.0)
PLATELETS: 170 10*3/uL (ref 150–400)
RBC: 3.56 MIL/uL — AB (ref 4.22–5.81)
RDW: 13.8 % (ref 11.5–15.5)
WBC: 11.4 10*3/uL — AB (ref 4.0–10.5)

## 2016-05-11 LAB — CREATININE, SERUM
CREATININE: 1.12 mg/dL (ref 0.61–1.24)
GFR calc non Af Amer: 57 mL/min — ABNORMAL LOW (ref >60)

## 2016-05-11 LAB — I-STAT TROPONIN, ED: TROPONIN I, POC: 0.07 ng/mL (ref 0.00–0.08)

## 2016-05-11 LAB — MAGNESIUM: MAGNESIUM: 2 mg/dL (ref 1.7–2.4)

## 2016-05-11 LAB — TROPONIN I: TROPONIN I: 0.06 ng/mL — AB (ref <0.03)

## 2016-05-11 SURGERY — TEMPORARY PACEMAKER
Anesthesia: LOCAL

## 2016-05-11 MED ORDER — DOPAMINE-DEXTROSE 3.2-5 MG/ML-% IV SOLN
3.0000 ug/kg/min | INTRAVENOUS | Status: DC
  Administered 2016-05-11: 3 ug/kg/min via INTRAVENOUS
  Filled 2016-05-11: qty 250

## 2016-05-11 MED ORDER — SODIUM CHLORIDE 0.9% FLUSH
10.0000 mL | Freq: Two times a day (BID) | INTRAVENOUS | Status: DC
  Administered 2016-05-11 – 2016-05-14 (×6): 10 mL

## 2016-05-11 MED ORDER — ASPIRIN 81 MG PO CHEW
81.0000 mg | CHEWABLE_TABLET | Freq: Every evening | ORAL | Status: DC
  Administered 2016-05-11 – 2016-05-13 (×3): 81 mg via ORAL
  Filled 2016-05-11 (×3): qty 1

## 2016-05-11 MED ORDER — SODIUM CHLORIDE 0.9% FLUSH: 10.0000 mL | INTRAVENOUS | Status: DC | PRN

## 2016-05-11 MED ORDER — ATORVASTATIN CALCIUM 40 MG PO TABS
40.0000 mg | ORAL_TABLET | Freq: Every day | ORAL | Status: DC
  Administered 2016-05-11 – 2016-05-13 (×3): 40 mg via ORAL
  Filled 2016-05-11 (×4): qty 1

## 2016-05-11 MED ORDER — EPINEPHRINE PF 1 MG/10ML IJ SOSY
PREFILLED_SYRINGE | INTRAMUSCULAR | Status: DC | PRN
  Administered 2016-05-11: 0.5 mg via INTRAVENOUS

## 2016-05-11 MED ORDER — HEPARIN SODIUM (PORCINE) 5000 UNIT/ML IJ SOLN
5000.0000 [IU] | Freq: Three times a day (TID) | INTRAMUSCULAR | Status: DC
  Administered 2016-05-11 – 2016-05-14 (×10): 5000 [IU] via SUBCUTANEOUS
  Filled 2016-05-11 (×9): qty 1

## 2016-05-11 MED ORDER — SALINE SPRAY 0.65 % NA SOLN
1.0000 | NASAL | Status: DC | PRN
  Filled 2016-05-11: qty 44

## 2016-05-11 MED ORDER — HEPARIN (PORCINE) IN NACL 2-0.9 UNIT/ML-% IJ SOLN
INTRAMUSCULAR | Status: AC
  Filled 2016-05-11: qty 500

## 2016-05-11 MED ORDER — SODIUM CHLORIDE 0.9% FLUSH
3.0000 mL | Freq: Two times a day (BID) | INTRAVENOUS | Status: DC
  Administered 2016-05-11: 10 mL via INTRAVENOUS

## 2016-05-11 MED ORDER — ONDANSETRON HCL 4 MG/2ML IJ SOLN
INTRAMUSCULAR | Status: DC | PRN
  Administered 2016-05-11: 4 mg via INTRAVENOUS

## 2016-05-11 MED ORDER — LIDOCAINE HCL (PF) 1 % IJ SOLN
INTRAMUSCULAR | Status: AC
  Filled 2016-05-11: qty 30

## 2016-05-11 MED ORDER — SODIUM CHLORIDE 0.9 % IV SOLN
INTRAVENOUS | Status: DC
  Administered 2016-05-11 (×2): via INTRAVENOUS

## 2016-05-11 MED ORDER — INSULIN ASPART 100 UNIT/ML ~~LOC~~ SOLN
0.0000 [IU] | Freq: Every day | SUBCUTANEOUS | Status: DC
  Administered 2016-05-13: 2 [IU] via SUBCUTANEOUS

## 2016-05-11 MED ORDER — INSULIN ASPART 100 UNIT/ML ~~LOC~~ SOLN
0.0000 [IU] | Freq: Three times a day (TID) | SUBCUTANEOUS | Status: DC
  Administered 2016-05-11 (×2): 2 [IU] via SUBCUTANEOUS
  Administered 2016-05-11 – 2016-05-12 (×3): 3 [IU] via SUBCUTANEOUS
  Administered 2016-05-12: 2 [IU] via SUBCUTANEOUS
  Administered 2016-05-13 (×2): 3 [IU] via SUBCUTANEOUS
  Administered 2016-05-13: 2 [IU] via SUBCUTANEOUS
  Administered 2016-05-14 (×2): 3 [IU] via SUBCUTANEOUS

## 2016-05-11 MED ORDER — ATROPINE SULFATE 0.4 MG/ML IJ SOLN
0.4000 mg | Freq: Once | INTRAMUSCULAR | Status: AC
  Administered 2016-05-11: 0.4 mg via INTRAVENOUS
  Filled 2016-05-11: qty 1

## 2016-05-11 MED ORDER — EPINEPHRINE PF 1 MG/10ML IJ SOSY
PREFILLED_SYRINGE | INTRAMUSCULAR | Status: AC
  Filled 2016-05-11: qty 10

## 2016-05-11 MED ORDER — CLOPIDOGREL BISULFATE 75 MG PO TABS
75.0000 mg | ORAL_TABLET | Freq: Every day | ORAL | Status: DC
  Administered 2016-05-11 – 2016-05-14 (×4): 75 mg via ORAL
  Filled 2016-05-11 (×4): qty 1

## 2016-05-11 MED ORDER — HEPARIN (PORCINE) IN NACL 2-0.9 UNIT/ML-% IJ SOLN
INTRAMUSCULAR | Status: DC | PRN
Start: 2016-05-11 — End: 2016-05-11
  Administered 2016-05-11: 500 mL via INTRA_ARTERIAL

## 2016-05-11 MED ORDER — ONDANSETRON HCL 4 MG/2ML IJ SOLN
INTRAMUSCULAR | Status: AC
  Filled 2016-05-11: qty 2

## 2016-05-11 MED ORDER — LIDOCAINE HCL (PF) 1 % IJ SOLN
INTRAMUSCULAR | Status: DC | PRN
  Administered 2016-05-11: 3 mL

## 2016-05-11 MED ORDER — GLUCAGON HCL RDNA (DIAGNOSTIC) 1 MG IJ SOLR
2.0000 mg | Freq: Once | INTRAMUSCULAR | Status: AC
Start: 2016-05-11 — End: 2016-05-11
  Administered 2016-05-11: 2 mg via INTRAVENOUS
  Filled 2016-05-11: qty 2

## 2016-05-11 SURGICAL SUPPLY — 8 items
CATH S G BIP PACING (SET/KITS/TRAYS/PACK) ×1 IMPLANT
COVER PRB 48X5XTLSCP FOLD TPE (BAG) IMPLANT
COVER PROBE 5X48 (BAG) ×2
PACK CARDIAC CATHETERIZATION (CUSTOM PROCEDURE TRAY) ×1 IMPLANT
SHEATH PINNACLE 6F 10CM (SHEATH) ×1 IMPLANT
SLEEVE REPOSITIONING LENGTH 30 (MISCELLANEOUS) ×1 IMPLANT
TRANSDUCER W/STOPCOCK (MISCELLANEOUS) IMPLANT
WIRE MICROINTRODUCER 60CM (WIRE) ×1 IMPLANT

## 2016-05-11 NOTE — ED Notes (Signed)
Attempted to call report,  [facility] states they have not received a page they were getting a patient. Writer ask to call back for report

## 2016-05-11 NOTE — CV Procedure (Signed)
Procedure performed: Ultrasound-guided access of right internal jugular vein, placement of a balloon tipped 5 French temporary transvenous pacemaker.  Indication: Patient with known coronary artery disease and CABG in the past, who has undergone recent TAVR , had been doing well at home when he had an episode of syncope, admitted to the hospital with complete heart block. Escape rhythm was unstable with a heart rate of 25 bpm. He had markedly prolonged QT interval due to underlying bundle branch block and has had frequent PVCs and hence felt that the rhythm was unstable that we should proceed with placement of a temporary pacemaker.  Technique: Under sterile precautions using ultrasound guidance, a 6 French sheath was introduced into the right internal jugular vein. A balloontipped temper pacemaker 5 [place] was introduced into the right atrium and right ventricle. Patient immediately went into asystole and 0.5 mg of epinephrine had to be administered.  Patient tolerated the procedure well. There was no immediate complication. Pacemaker capture was obtained at 0.6 MA however pacemaker position is not stable, fortunately patient also has underlying escape rhythm of 70 bpm. I was able to get capture however it was intermittent, left the temporary pacemaker at MA of 20 with a VVI mode at the rate of 50 bpm. I have discussed with Dr. Sharrell KuGreg Taylor regarding unstable position of the temporary pacemaker. I personally sutured the temper pacemaker in place and secured it.

## 2016-05-11 NOTE — H&P (Addendum)
History and Physical  Primary Cardiologist:Kelly, McAlhaney PCP: Colette Ribas, MD  Chief Complaint: Syncope  HPI:  82 YO male with HTN, HLD, DM, CAD s/p CABG and prior PCI with ICM, PAD, aortic stenosis s/p recent TAVR (Sapien 3, 29 mm).  Had some intermittent post op afib.  History of LBBB with PVCs.  Since discharge on 05/03/16 he has largely felt well other than some lingering cough/congestion.  No other lightheadedness or pre-syncope.  Acutely while brushing his teeth tonight he had LOC and fell scraping his left flank and nose.  No prodrome of any kind.  Takes 100 mg BID of metoprolol, he states that he has been taking this as directed without extra/deviation.    Was found to be in complete heart block by EMS.  In ED, atropine and glucagon given without improvement in HR.    On interview, denies chest pain, sob, worsening edema.  Denies current lightheadedness or pre-syncopal symptoms.  No visual disturbances, weakness.    Prior Studies ECHO 05/01/16 Study Conclusions  - Left ventricle: The cavity size was normal. There was moderate   concentric hypertrophy. Systolic function was mildly reduced. The   estimated ejection fraction was in the range of 45% to 50%.   Images were inadequate for LV wall motion assessment. There was   an increased relative contribution of atrial contraction to   ventricular filling. Doppler parameters are consistent with   abnormal left ventricular relaxation (grade 1 diastolic   dysfunction). - Ventricular septum: Septal motion showed moderate paradox. These   changes are consistent with intraventricular conduction delay. - Aortic valve: S/P TAVR with Sapien 3 bioprosthesis with normal   function. There is no perivalvular leak. A bioprosthesis was   present. Mean gradient (S): 10 mm Hg. Valve area (VTI): 2.48   cm^2. Valve area (Vmax): 2.22 cm^2. Valve area (Vmean): 2.46   cm^2. - Mitral valve: There was mild regurgitation. - Pulmonic  valve: There was trivial regurgitation.  Echo 11/21/15: Left ventricle: The cavity size was normal. Wall thickness was increased in a pattern of moderate LVH. Systolic function was moderately to severely reduced. The estimated ejection fraction was in the range of 30% to 35%. Diffuse hypokinesis. Doppler parameters are consistent with abnormal left ventricular relaxation (grade 1 diastolic dysfunction). Doppler parameters are consistent with high ventricular filling pressure. - Aortic valve: Valve mobility was restricted. There was moderate stenosis. There was mild regurgitation. - Aortic root: The aortic root was mildly dilated. - Ascending aorta: The ascending aorta was mildly dilated. - Mitral valve: There was mild regurgitation. - Left atrium: The atrium was moderately dilated. - Right ventricle: The cavity size was mildly dilated. - Right atrium: The atrium was mildly dilated. - Pulmonary arteries: Systolic pressure was moderately increased. PA peak pressure: 46 mm Hg (S).  DSE 03/20/16: Impressions: Stage: Peak Vel Mean Gradient Peak Gradient AVA  Baseline 3.4 m/sec 25 mmHg 46 mmHg 1.02 cm2 Low Dose 3.56 m/sec 28 mmHg 51 mmHg .92 cm2 Mid Dose 3.74 m/sec 31 mmHg 56 mmHg 1.08 cm2 High Dose 3.86 m/sec 31 mmHg 60 mmHg 1.04 cm2  SV increased by 22% with Dobutamine infusion indictating some contractile reserve Overall findings consistent wtih moderate to severe fixed AS  [facility] 04/05/16  Ost 2nd Diag to 2nd Diag lesion, 90 %stenosed.  Mid LAD to Dist LAD lesion, 100 %stenosed.  Ost Cx lesion, 100 %stenosed.  Ost RCA lesion, 100 %stenosed.  SVG.  SVG.  SVG.  Origin to Prox Graft  lesion, 20 %stenosed.  LIMA.  There is moderate (3+) aortic regurgitation.   Severe multivessel native CAD with 90% stenosis in the second diagonal branch of the LAD and total occlusion of the LAD after the proximal septal perforating artery; total occlusion  of the left circumflex vessel ostially and total occlusion of the RCA ostium.  Patent LIMA graft which supplies the mid LAD.  There is a smooth 10% ostial narrowing.  The subclavian vessel takes off much more rightward from the flattened aortic arch.  Patent SVG which supplies the circumflex marginal vessel.  A stent is in the proximal portion of the graft which is patent and has smooth inferior intimal hyperplasia of less than 20%.  Patent SVG which supplies the second diagonal vessel of the LAD.  Patent SVG which supplies the mid PDA.  A sequential limb was not visualized, but the distal RCA gave rise to 3 additional branches beyond the PDA takeoff.  Supravalvular aortography mentioning a dilated aortic root with markedly reduced aortic valve excursion, and at least 2+ angiographic aortic insufficiency.  Significant aortic valve stenosis which has been documented on dobutamine echo as low gradient severe  AS.  Mild pulmonary hypertension.  RECOMMENDATION: The present catheterization continues to demonstrate patency to the vein graft conduits as well as the left internal mammary artery with severe native CAD.  The patient has experienced a definite decline in functional capacity and on recent echo Doppler study EF has declined to 35%.  I have recommended that he follow-up with Dr. Sanjuana Kava to proceed with TAVR and additional imaging requirements prior to final recommendations.  Past Medical History:  Diagnosis Date  . Age-related macular degeneration, dry, both eyes   . Arthritis    "just about all over my body"  . Chronic combined systolic and diastolic CHF (congestive heart failure) (HCC)    a. 11/2015 Echo: EF 30-35%, diff HK, Gr1 DD.  Marland Kitchen Coronary artery disease    a. 1991 PTCA of LAD; b. 1998 CABG x 5 (LIMA->LAD, VG->RPDA->RPLA, VG->OM1, VG->D2; c. 09/2014 MV: lat ischemia, EF 37%; d. 10/2014 PCI: LAD 100, LCX 99ost/100p, RCA 100p, LIMA->LAD ok, VG->D2 ok, VG->OM1 95(3.0x15  Resolute Integrity DES), VG->PDA ok w/ 30% w/in VG->PL.  Marland Kitchen GERD (gastroesophageal reflux disease)   . High cholesterol   . History of hiatal hernia   . Hypertension   . Ischemic cardiomyopathy    a. 11/2015 Echo: EF 30-35%, diff HK, Gr1 DD.  Marland Kitchen LBBB (left bundle branch block)   . Moderate aortic stenosis    a. 11/2015 Echo: EF 30-35%, diff HK, Gr1 DD, mod AS, mild AI, mildly dil Ao root and Asc Ao, mild MR, mod dil LA, PASP .  . Osteoporosis   . PAD (peripheral artery disease) (HCC)    a. s/p prior rotational atherectomy of L pop and tib trunk and PTA of focal popliteal lesions; b. 03/2015 ABI: R - 0.88, L 0.96, LCFA 50-79%, RSFA 50-79% distal stenosis, 3 vessel runoff.  . Palpitations    a. 06/2012 Event monitor: freq ventricular ectopy w/o VT.  Marland Kitchen PVC (premature ventricular contraction) 04/26/1997   on beta blocker/pt had cardio net monitor 06/23/2012  . RLS (restless legs syndrome) 10/01/2012  . Type II diabetes mellitus (HCC)   . Unspecified hereditary and idiopathic peripheral neuropathy 10/01/2012    Past Surgical History:  Procedure Laterality Date  . APPENDECTOMY    . CARDIAC CATHETERIZATION  04/26/1997   CAD - 50-60% prox to mid LAD with 70% stenosis  in mid-distal, 70-80% 1st diag stenosis, 50-60% ostial stenosis in large 2nd diagonal; diffuse RCA stenosis with 40-50% osital narrowing with 70% prox stenosis (Dr. Bishop Limbo. Kelly) >> CABG  . CARDIAC CATHETERIZATION  11/01/2014  . CARDIAC CATHETERIZATION N/A 04/05/2016   Procedure: Right/Left Heart Cath and Coronary/Graft Angiography;  Surgeon: Lennette Biharihomas A Kelly, MD;  [facility];  Service: Cardiovascular;  Laterality: N/A;  . CATARACT EXTRACTION W/ INTRAOCULAR LENS  IMPLANT, BILATERAL Bilateral   . CORONARY ARTERY BYPASS GRAFT  04/1997   CABGx5 - LIMA to LAD, SVG to PDA, SVG to PLA, VG to OM1, VG to 2nd diagonal (Dr. Dorris FetchHendrickson)  . DOPPLER ECHOCARDIOGRAPHY  05/18/2012   EF 45-50%, LV systolic function mildly reduced;  borderline LA enlargment; mild mitral annular calcif, mild-mod MR; mild TR with normal RVSP; mild calcification of AV leaflets and mild-mod valvular AS with mild regurg      . DOPPLER ECHOCARDIOGRAPHY  08/19/2011   EF 50-55%, mod conc LVH; mild mitral annular calcif with mild MR; mild-mod TR; AV mildly sclerotic with mild valvular AS and mild regurg, mild aortic root dilatation  . HERNIA REPAIR    . HIATAL HERNIA REPAIR    . INGUINAL HERNIA REPAIR Bilateral   . LEFT AND RIGHT HEART CATHETERIZATION WITH CORONARY ANGIOGRAM N/A 11/01/2014   Procedure: LEFT AND RIGHT HEART CATHETERIZATION WITH CORONARY ANGIOGRAM;  Surgeon: Lennette Biharihomas A Kelly, MD; EF35-40%, mild AS w/ 2+ AI, LAD, CFX, RCA 100%, LIMA-LAD OK, SVG-D1 OK, SVG-PDA 30%, SVG-OM 95%  . LOWER EXTREMITY ANGIOGRAM  02/05/2011   Diamondback orbital rotational atherectomy, percutaneous transluminal angioplasty of high-grade calcified popliteal & tibioperoneal stenosis (Dr. Erlene QuanJ. Berry)  . LOWER EXTREMITY ARTERIAL DOPPLER  02/2012   RLE - mild to mod arterial insuff; RSFA 50-69% diameter reduction; R pop 50-69% diameter reduction; posterior tibial (R) demonstrates occlusive disease; LSFA/prox pop 0-49% diameter reduction; L distal pop 50-69% diameter reduction; posterior tibial (L) demonstrates occlusive disease  . NM MYOCAR PERF EJECTION FRACTION  09/2011   lexiscan - no reversible ischmai, fixed anteroseptal defect r/t LBBB; EF 51%; septal hypokinesis; low risk but abnormal  . PERCUTANEOUS CORONARY STENT INTERVENTION (PCI-S) N/A 11/02/2014   Procedure: PERCUTANEOUS CORONARY STENT INTERVENTION (PCI-S);  Surgeon: Lennette Biharihomas A Kelly, MD; 3.015 mm Resolute integrity DES to the SVG-OM   . TEE WITHOUT CARDIOVERSION N/A 04/30/2016   Procedure: TRANSESOPHAGEAL ECHOCARDIOGRAM (TEE);  Surgeon: Kathleene Hazelhristopher D McAlhany, MD;  [facility];  Service: Open Heart Surgery;  Laterality: N/A;  . TONSILLECTOMY    . TRANSCATHETER AORTIC VALVE REPLACEMENT, TRANSFEMORAL N/A  04/30/2016   Procedure: TRANSCATHETER AORTIC VALVE REPLACEMENT, TRANSFEMORAL;  Surgeon: Kathleene Hazelhristopher D McAlhany, MD;  [facility];  Service: Open Heart Surgery;  Laterality: N/A;    Family History  Problem Relation Age of Onset  . CAD Mother   . Heart attack Father    Social History:  reports that he has never smoked. He has never used smokeless tobacco. He reports that he does not drink alcohol or use drugs.  Allergies:  Allergies  Allergen Reactions  . Other     Patient is allergic to something but doesn't know the name.  . Pramipexole Swelling    No current facility-administered medications on file prior to encounter.    Current Outpatient Prescriptions on File Prior to Encounter  Medication Sig Dispense Refill  . amLODipine (NORVASC) 10 MG tablet Take 1 tablet (10 mg total) by mouth daily. 30 tablet 12  . aspirin 81 MG tablet Take 81  mg by mouth every evening.     Marland Kitchen. atorvastatin (LIPITOR) 40 MG tablet TAKE 1 TABLET DAILY AT 6 P.M. (KEEP OFFICE VISIT) 90 tablet 2  . cetirizine (ZYRTEC) 10 MG tablet Take 10 mg by mouth daily as needed for allergies.     Marland Kitchen. clopidogrel (PLAVIX) 75 MG tablet TAKE 1 TABLET DAILY (KEEP OFFICE VISIT) 90 tablet 2  . gabapentin (NEURONTIN) 300 MG capsule Take 1 capsule (300 mg total) by mouth at bedtime. 90 capsule 3  . hydrALAZINE (APRESOLINE) 10 MG tablet Take 2 tablets (20 mg total) by mouth every 8 (eight) hours. 270 tablet 1  . metformin (FORTAMET) 1000 MG (OSM) 24 hr tablet Take 1,000 mg by mouth 2 (two) times daily with a meal.    . metoprolol (LOPRESSOR) 100 MG tablet Take 1 tablet (100 mg total) by mouth 2 (two) times daily. 180 tablet 1  . Multiple Vitamins-Minerals (PRESERVISION AREDS 2 PO) Take 1 capsule by mouth 2 (two) times daily.     . multivitamin-iron-minerals-folic acid (CENTRUM) chewable tablet Chew 1 tablet by mouth daily.    . niacin (NIASPAN) 750 MG CR tablet Take 2 tablets (1,500 mg total) by mouth at bedtime. 180 tablet 1  .  nitroGLYCERIN (NITROSTAT) 0.4 MG SL tablet Place 1 tablet (0.4 mg total) under the tongue every 5 (five) minutes as needed for chest pain. 25 tablet 3  . oxymetazoline (AFRIN) 0.05 % nasal spray Place 2 sprays into both nostrils 3 (three) times daily as needed for congestion (Allergies).    . polycarbophil (FIBERCON) 625 MG tablet Take 1,250 mg by mouth daily.     . potassium chloride SA (K-DUR,KLOR-CON) 20 MEQ tablet Take 1 tablet (20 mEq total) by mouth daily. 30 tablet 12  . torsemide (DEMADEX) 20 MG tablet Take 2 tablets (40 mg total) by mouth 2 (two) times daily. 120 tablet 12  . zoledronic acid (RECLAST) 5 MG/100ML SOLN Inject 100 mLs (5 mg total) into the vein once. (Patient taking differently: Inject 5 mg into the vein once. Next dose due 07-2016) 100 mL 0    Results for orders placed or performed during the hospital encounter of 05/11/16 (from the past 48 hour(s))  I-stat troponin, ED     Status: None   Collection Time: 05/11/16  1:48 AM  Result Value Ref Range   Troponin i, poc 0.07 0.00 - 0.08 ng/mL   Comment 3            Comment: Due to the release kinetics of cTnI, a negative result within the first hours of the onset of symptoms does not rule out myocardial infarction with certainty. If myocardial infarction is still suspected, repeat the test at appropriate intervals.    No results found.  ECG/Tele: third degree HB rate 30 with RBBB-like morphology with deep TWI ant precordial leads  ROS: As above. Otherwise, review of systems is negative unless per above HPI  Vitals:   05/11/16 0155 05/11/16 0200 05/11/16 0213 05/11/16 0214  BP:    (!) 122/46  Pulse:  (!) 29 (!) 35   Resp:  12 14   Temp:      TempSrc:      SpO2:  97% 95%   Weight: 73.5 kg (162 lb)     Height: 5\' 6"  (1.676 m)      Wt Readings from Last 10 Encounters:  05/11/16 73.5 kg (162 lb)  05/03/16 73.5 kg (162 lb 1.6 oz)  04/26/16 79.4 kg (175 lb 1  oz)  04/17/16 77.1 kg (170 lb)  04/10/16 78.5 kg (173  lb)  04/05/16 78.5 kg (173 lb)  03/27/16 78.5 kg (173 lb)  03/05/16 78.6 kg (173 lb 3.2 oz)  01/30/16 79.7 kg (175 lb 9.6 oz)  11/29/15 79.9 kg (176 lb 3.2 oz)    PE:  General: No acute distress HEENT: abrasion nose and previous skin lesions on crown of head, EOMI, mucous membranes moist CV: marked bradycardia with mild SEM and possible S4; JVP at 10 at 45 degrees Respiratory: NL overall WOB with intermittent cough and RLL>LLL rhonci ABD: Non-distended and non-tender. No palpable organomegaly.  Extremities: 2+ radial pulses bilaterally,  Bilateral femoral access sites C/D/I. 2+ bilateral LE edema. Neuro/Psych: CN grossly intact, alert and oriented  Assessment/Plan HTN CAD s/p CABG and PCI HLD AS s/p Sapien 3, 29 mm valve PAD Syncope Mild trauma (left flank, nose) Mild AKI Mild hyponatremia  Complete heart block Given no response to atropine or glucagon, I favor this to be related to TAVR and patient will likely need pacemaker.  ED to finish trauma survey and imaging but assuming this will be relatively benign, patient to be admitted to CICU with possible need for transvenous pacing overnight given HR high 20s and BP 120s/40s.  Currently asymptomatic.    HTN - Acutely hold amlodipine, hydralazine, metoprolol  CAD s/p CABG and PCI - Continue ASA and plavix  HLD - Continue atorvastatin  AS s/p Sapien 3, 29 mm valve - No paravalvular leak on post echo.  Antiplatelets as above  Post op acute afib - likely will get dual chamber device to monitor for recurrence  PAD - antiplatelets as above  Syncope - Favor related to heart block as above  Mild trauma (left flank, nose) - Trauma survery pending, follow up pending results  Mild AKI &Mild hyponatremia - CTM  DM - SSI hold metformin  Full Code confirmed on admission   Greig Castilla Kyriakos Babler  MD 05/11/2016, 2:24 AM

## 2016-05-11 NOTE — ED Notes (Signed)
RN accompanied pt to CT, pt returned. No change in HR

## 2016-05-11 NOTE — ED Triage Notes (Signed)
Pt transported from home after syncopal episode while brushing his teeth @ 2300 last night.  Pt noted to have skin tear to bridge of nose, pt does take plavix, denies neck pain. Pt states he had no warning before episode, denies pain at this time.  Pt reports heart valve replacement 04/30/16 at this facility by Dr. Camillo FlamingMcelhaney.  Pt found to be in complete heart block with no known hx of same.  Pt is A & O, wife at bedside

## 2016-05-11 NOTE — Progress Notes (Addendum)
I turned off the pacemaker as the pacer was not sensing the native R and was pacing asynchronously to avoid R on T. However he paces 100% with high mA at 20V. RN aware to watch patient closely and turn on pacer if needed. He is back in sinus rhythm now.

## 2016-05-11 NOTE — ED Notes (Signed)
Pacer pads placed on patient, zoll at bedside

## 2016-05-11 NOTE — Interval H&P Note (Signed)
History and Physical Interval Note:  05/11/2016 5:17 AM  Melvin [name]  has presented today for surgery, with the diagnosis of urgent pacemaker  The various methods of treatment have been discussed with the patient and family. After consideration of risks, benefits and other options for treatment, the patient has consented to  Procedure(s): Temporary Pacemaker (N/A) as a surgical intervention .  The patient's history has been reviewed, patient examined, no change in status, stable for surgery.  I have reviewed the patient's chart and labs.  Questions were answered to the patient's satisfaction.    I have evaluated the patient, patient now has symptomatic complete heart with junctional escape, EKG revealing IVCD, underlying sinus rhythm at the rate of 50 bpm, ventricle escape at a rate of 25-29 bpm with frequent PVCs.  I met the patient's family, met the patient, discussed the procedural risks including pneumothorax but not limited to this. There was agreement to proceed. He will eventually need permanent pacemaker implantation.  Adrian Prows

## 2016-05-11 NOTE — ED Provider Notes (Addendum)
[facility]-EMERGENCY DEPT Provider Note   CSN: [id number] Arrival date & time: 05/11/16  0118  By signing my name below, I, Melvin [name], attest that this documentation has been prepared under the direction and in the presence of Melvin Creasehristopher J Stasia Somero, MD. Electronically Signed: Doreatha MartinEva [name], ED Scribe. 05/11/16. 1:38 AM.     History   Chief Complaint Chief Complaint  Patient presents with  . Loss of Consciousness    HPI Melvin [name] is a 82 y.o. male s/p transfemoral transcatheter aortic valve replacement 04/30/16 brought in by ambulance who presents to the Emergency Department complaining of an episode of syncope that occurred approximately one hour PTA. Pt states he was standing and brushing his teeth when he suddenly lost consciousness. Pt reports he does not remember falling, but remembers coming to and waking up on the bathroom floor. Per wife, she heard the fall and states the pt was awake when she got to him within the minute. Pt states he did not feel the episode of syncope coming on. EMS reports pt was bradycardic (30 bpm) en route. He denies CP, palpitations, SOB.   The history is provided by the patient and the EMS personnel. No language interpreter was used.    Past Medical History:  Diagnosis Date  . Age-related macular degeneration, dry, both eyes   . Arthritis    "just about all over my body"  . Chronic combined systolic and diastolic CHF (congestive heart failure) (HCC)    a. 11/2015 Echo: EF 30-35%, diff HK, Gr1 DD.  Marland Kitchen. Coronary artery disease    a. 1991 PTCA of LAD; b. 1998 CABG x 5 (LIMA->LAD, VG->RPDA->RPLA, VG->OM1, VG->D2; c. 09/2014 MV: lat ischemia, EF 37%; d. 10/2014 PCI: LAD 100, LCX 99ost/100p, RCA 100p, LIMA->LAD ok, VG->D2 ok, VG->OM1 95(3.0x15 Resolute Integrity DES), VG->PDA ok w/ 30% w/in VG->PL.  Marland Kitchen. GERD (gastroesophageal reflux disease)   . High cholesterol   . History of hiatal hernia   . Hypertension   . Ischemic cardiomyopathy    a. 11/2015 Echo: EF  30-35%, diff HK, Gr1 DD.  Marland Kitchen. LBBB (left bundle branch block)   . Moderate aortic stenosis    a. 11/2015 Echo: EF 30-35%, diff HK, Gr1 DD, mod AS, mild AI, mildly dil Ao root and Asc Ao, mild MR, mod dil LA, PASP 46mmHg.  . Osteoporosis   . PAD (peripheral artery disease) (HCC)    a. s/p prior rotational atherectomy of L pop and tib trunk and PTA of focal popliteal lesions; b. 03/2015 ABI: R - 0.88, L 0.96, LCFA 50-79%, RSFA 50-79% distal stenosis, 3 vessel runoff.  . Palpitations    a. 06/2012 Event monitor: freq ventricular ectopy w/o VT.  Marland Kitchen. PVC (premature ventricular contraction) 04/26/1997   on beta blocker/pt had cardio net monitor 06/23/2012  . RLS (restless legs syndrome) 10/01/2012  . Type II diabetes mellitus (HCC)   . Unspecified hereditary and idiopathic peripheral neuropathy 10/01/2012    Patient Active Problem List   Diagnosis Date Noted  . Complete heart block (HCC) 05/11/2016  . S/P TAVR (transcatheter aortic valve replacement) 05/11/2016  . Severe aortic valve stenosis 04/30/2016  . Palpitations 06/17/2015  . Skin lesion 02/11/2015  . Unstable angina pectoris (HCC) 11/03/2014  . CAD in native artery 09/28/2014  . SOB (shortness of breath) 04/26/2014  . Tachycardia 04/26/2014  . Non-insulin dependent type 2 diabetes mellitus (HCC) 07/02/2013  . Osteoporosis, unspecified 06/24/2013  . Edema, lower extremity 06/22/2013  . LBBB (left bundle branch block)  12/01/2012  . Heart palpitations 12/01/2012  . Aortic stenosis 12/01/2012  . Unspecified hereditary and idiopathic peripheral neuropathy 10/01/2012  . RLS (restless legs syndrome) 10/01/2012  . Hypertension 11/26/2011  . High cholesterol 11/26/2011  . Liver lesion 11/26/2011    Past Surgical History:  Procedure Laterality Date  . APPENDECTOMY    . CARDIAC CATHETERIZATION  04/26/1997   CAD - 50-60% prox to mid LAD with 70% stenosis in mid-distal, 70-80% 1st diag stenosis, 50-60% ostial stenosis in large 2nd diagonal;  diffuse RCA stenosis with 40-50% osital narrowing with 70% prox stenosis (Dr. Bishop Limbo) >> CABG  . CARDIAC CATHETERIZATION  11/01/2014  . CARDIAC CATHETERIZATION N/A 04/05/2016   Procedure: Right/Left Heart Cath and Coronary/Graft Angiography;  Surgeon: Lennette Bihari, MD;  Location: [facility] INVASIVE CV LAB;  Service: Cardiovascular;  Laterality: N/A;  . CATARACT EXTRACTION W/ INTRAOCULAR LENS  IMPLANT, BILATERAL Bilateral   . CORONARY ARTERY BYPASS GRAFT  04/1997   CABGx5 - LIMA to LAD, SVG to PDA, SVG to PLA, VG to OM1, VG to 2nd diagonal (Dr. Dorris Fetch)  . DOPPLER ECHOCARDIOGRAPHY  05/18/2012   EF 45-50%, LV systolic function mildly reduced; borderline LA enlargment; mild mitral annular calcif, mild-mod MR; mild TR with normal RVSP; mild calcification of AV leaflets and mild-mod valvular AS with mild regurg      . DOPPLER ECHOCARDIOGRAPHY  08/19/2011   EF 50-55%, mod conc LVH; mild mitral annular calcif with mild MR; mild-mod TR; AV mildly sclerotic with mild valvular AS and mild regurg, mild aortic root dilatation  . HERNIA REPAIR    . HIATAL HERNIA REPAIR    . INGUINAL HERNIA REPAIR Bilateral   . LEFT AND RIGHT HEART CATHETERIZATION WITH CORONARY ANGIOGRAM N/A 11/01/2014   Procedure: LEFT AND RIGHT HEART CATHETERIZATION WITH CORONARY ANGIOGRAM;  Surgeon: Lennette Bihari, MD; EF35-40%, mild AS w/ 2+ AI, LAD, CFX, RCA 100%, LIMA-LAD OK, SVG-D1 OK, SVG-PDA 30%, SVG-OM 95%  . LOWER EXTREMITY ANGIOGRAM  02/05/2011   Diamondback orbital rotational atherectomy, percutaneous transluminal angioplasty of high-grade calcified popliteal & tibioperoneal stenosis (Dr. Erlene Quan)  . LOWER EXTREMITY ARTERIAL DOPPLER  02/2012   RLE - mild to mod arterial insuff; RSFA 50-69% diameter reduction; R pop 50-69% diameter reduction; posterior tibial (R) demonstrates occlusive disease; LSFA/prox pop 0-49% diameter reduction; L distal pop 50-69% diameter reduction; posterior tibial (L) demonstrates occlusive disease  . NM  MYOCAR PERF EJECTION FRACTION  09/2011   lexiscan - no reversible ischmai, fixed anteroseptal defect r/t LBBB; EF 51%; septal hypokinesis; low risk but abnormal  . PERCUTANEOUS CORONARY STENT INTERVENTION (PCI-S) N/A 11/02/2014   Procedure: PERCUTANEOUS CORONARY STENT INTERVENTION (PCI-S);  Surgeon: Lennette Bihari, MD; 3.015 mm Resolute integrity DES to the SVG-OM   . TEE WITHOUT CARDIOVERSION N/A 04/30/2016   Procedure: TRANSESOPHAGEAL ECHOCARDIOGRAM (TEE);  Surgeon: Kathleene Hazel, MD;  Location: [facility] OR;  Service: Open Heart Surgery;  Laterality: N/A;  . TONSILLECTOMY    . TRANSCATHETER AORTIC VALVE REPLACEMENT, TRANSFEMORAL N/A 04/30/2016   Procedure: TRANSCATHETER AORTIC VALVE REPLACEMENT, TRANSFEMORAL;  Surgeon: Kathleene Hazel, MD;  Location: [facility] OR;  Service: Open Heart Surgery;  Laterality: N/A;       Home Medications    Prior to Admission medications   Medication Sig Start Date End Date Taking? Authorizing Provider  amLODipine (NORVASC) 10 MG tablet Take 1 tablet (10 mg total) by mouth daily. 05/03/16  Yes Little Ishikawa, NP  aspirin 81 MG tablet Take 81 mg by mouth every  evening.    Yes Historical Provider, MD  atorvastatin (LIPITOR) 40 MG tablet TAKE 1 TABLET DAILY AT 6 P.M. (KEEP OFFICE VISIT) 02/05/16  Yes Lennette Biharihomas A Kelly, MD  cetirizine (ZYRTEC) 10 MG tablet Take 10 mg by mouth daily as needed for allergies.    Yes Historical Provider, MD  clopidogrel (PLAVIX) 75 MG tablet TAKE 1 TABLET DAILY (KEEP OFFICE VISIT) 02/05/16  Yes Lennette Biharihomas A Kelly, MD  gabapentin (NEURONTIN) 300 MG capsule Take 1 capsule (300 mg total) by mouth at bedtime. 10/26/15  Yes Huston FoleySaima Athar, MD  hydrALAZINE (APRESOLINE) 10 MG tablet Take 2 tablets (20 mg total) by mouth every 8 (eight) hours. 01/03/16  Yes Lennette Biharihomas A Kelly, MD  metformin (FORTAMET) 1000 MG (OSM) 24 hr tablet Take 1,000 mg by mouth 2 (two) times daily with a meal.   Yes Historical Provider, MD  metoprolol (LOPRESSOR) 100 MG tablet Take 1  tablet (100 mg total) by mouth 2 (two) times daily. 02/27/16 05/27/16 Yes Lennette Biharihomas A Kelly, MD  Multiple Vitamins-Minerals (PRESERVISION AREDS 2 PO) Take 1 capsule by mouth 2 (two) times daily.    Yes Historical Provider, MD  multivitamin-iron-minerals-folic acid (CENTRUM) chewable tablet Chew 1 tablet by mouth daily.   Yes Historical Provider, MD  niacin (NIASPAN) 750 MG CR tablet Take 2 tablets (1,500 mg total) by mouth at bedtime. 01/22/16  Yes Lennette Biharihomas A Kelly, MD  nitroGLYCERIN (NITROSTAT) 0.4 MG SL tablet Place 1 tablet (0.4 mg total) under the tongue every 5 (five) minutes as needed for chest pain. 11/29/15  Yes Lennette Biharihomas A Kelly, MD  oxymetazoline (AFRIN) 0.05 % nasal spray Place 2 sprays into both nostrils 3 (three) times daily as needed for congestion (Allergies).   Yes Historical Provider, MD  polycarbophil (FIBERCON) 625 MG tablet Take 1,250 mg by mouth daily.    Yes Historical Provider, MD  potassium chloride SA (K-DUR,KLOR-CON) 20 MEQ tablet Take 1 tablet (20 mEq total) by mouth daily. 05/04/16  Yes Little IshikawaErin E Smith, NP  torsemide (DEMADEX) 20 MG tablet Take 2 tablets (40 mg total) by mouth 2 (two) times daily. 05/03/16  Yes Little IshikawaErin E Smith, NP  zoledronic acid (RECLAST) 5 MG/100ML SOLN Inject 100 mLs (5 mg total) into the vein once. Patient taking differently: Inject 5 mg into the vein once. Next dose due 07-2016 07/01/11  Yes Reather LittlerAjay Kumar, MD    Family History Family History  Problem Relation Age of Onset  . CAD Mother   . Heart attack Father     Social History Social History  Substance Use Topics  . Smoking status: Never Smoker  . Smokeless tobacco: Never Used  . Alcohol use No     Allergies   Other and Pramipexole   Review of Systems Review of Systems  Respiratory: Negative for shortness of breath.   Cardiovascular: Negative for chest pain and palpitations.  Neurological: Positive for syncope.  All other systems reviewed and are negative.    Physical Exam Updated Vital  Signs BP (!) 138/40   Pulse (!) 25   Temp 97.6 F (36.4 C) (Oral)   Resp 14   Ht 5\' 6"  (1.676 m)   Wt 162 lb (73.5 kg)   SpO2 95%   BMI 26.15 kg/m   Physical Exam  Constitutional: He is oriented to person, place, and time. He appears well-developed and well-nourished. No distress.  HENT:  Head: Normocephalic and atraumatic.  Right Ear: Hearing normal.  Left Ear: Hearing normal.  Nose: No nasal septal hematoma.  Mouth/Throat: Oropharynx is clear and moist and mucous membranes are normal.  Eyes: Conjunctivae and EOM are normal. Pupils are equal, round, and reactive to light.  Neck: Normal range of motion. Neck supple.  Cardiovascular: Regular rhythm.  Bradycardia present.  Exam reveals no gallop and no friction rub.   No murmur heard. Pulmonary/Chest: Effort normal and breath sounds normal. No respiratory distress. He exhibits no tenderness.  Abdominal: Soft. Normal appearance and bowel sounds are normal. There is no hepatosplenomegaly. There is no tenderness. There is no rebound, no guarding, no tenderness at McBurney's point and negative Murphy's sign. No hernia.  Musculoskeletal: Normal range of motion.  Neurological: He is alert and oriented to person, place, and time. He has normal strength. No cranial nerve deficit or sensory deficit. Coordination normal. GCS eye subscore is 4. GCS verbal subscore is 5. GCS motor subscore is 6.  Skin: Skin is warm and dry. Ecchymosis (linear, left flank) noted. No rash noted.     Psychiatric: He has a normal mood and affect. His behavior is normal. Thought content normal.  Nursing note and vitals reviewed.    ED Treatments / Results   DIAGNOSTIC STUDIES: Oxygen Saturation is 97% on RA, normal by my interpretation.    COORDINATION OF CARE: 1:34 AM Discussed treatment plan with pt at bedside which includes EKG and pt agreed to plan.    Labs (all labs ordered are listed, but only abnormal results are displayed) Labs Reviewed  CBC  WITH DIFFERENTIAL/PLATELET - Abnormal; Notable for the following:       Result Value   RBC 3.77 (*)    Hemoglobin 11.8 (*)    HCT 34.6 (*)    All other components within normal limits  BASIC METABOLIC PANEL - Abnormal; Notable for the following:    Sodium 128 (*)    Chloride 91 (*)    Glucose, Bld 174 (*)    BUN 28 (*)    GFR calc non Af Amer 53 (*)    All other components within normal limits  I-STAT TROPOININ, ED    EKG  EKG Interpretation  Date/Time:  Saturday May 11 2016 01:26:24 EDT Ventricular Rate:  30 PR Interval:    QRS Duration: 204 QT Interval:  685 QTC Calculation: 484 R Axis:   47 Text Interpretation:  Third degree heart block Confirmed by Blinda Leatherwood  MD, Stepheny Canal [phone]) on 05/11/2016 2:21:26 AM       Radiology Ct Head Wo Contrast  Result Date: 05/11/2016 CLINICAL DATA:  Acute onset of bradycardia and syncope. Status post fall face first onto floor. Hit forehead and nose. Initial encounter. EXAM: CT HEAD WITHOUT CONTRAST CT MAXILLOFACIAL WITHOUT CONTRAST TECHNIQUE: Multidetector CT imaging of the head and maxillofacial structures were performed using the standard protocol without intravenous contrast. Multiplanar CT image reconstructions of the maxillofacial structures were also generated. COMPARISON:  None. FINDINGS: CT HEAD FINDINGS Brain: No evidence of acute infarction, hemorrhage, hydrocephalus, extra-axial collection or mass lesion/mass effect. Prominence of the ventricles and sulci reflects mild to moderate cortical volume loss. Cerebellar atrophy is noted. Mild periventricular white matter change likely reflects small vessel ischemic microangiopathy. The brainstem and fourth ventricle are within normal limits. The basal ganglia are unremarkable in appearance. The cerebral hemispheres demonstrate grossly normal gray-white differentiation. No mass effect or midline shift is seen. Vascular: No hyperdense vessel or unexpected calcification. Skull: There is no  evidence of fracture; visualized osseous structures are unremarkable in appearance. Other: No significant soft tissue abnormalities are seen. CT  MAXILLOFACIAL FINDINGS Osseous: There is no evidence of fracture or dislocation. The maxilla and mandible appear intact. The nasal bone is unremarkable in appearance. The visualized dentition demonstrates no acute abnormality. Degenerative flattening is noted at the right temporomandibular joint. Orbits: The orbits are intact bilaterally. Sinuses: The visualized paranasal sinuses and mastoid air cells are well-aerated. Soft tissues: No significant soft tissue abnormalities are seen. The parapharyngeal fat planes are preserved. The nasopharynx, oropharynx and hypopharynx are unremarkable in appearance. The visualized portions of the valleculae and piriform sinuses are grossly unremarkable. Mild calcification is seen at the carotid bifurcations bilaterally. The parotid and submandibular glands are within normal limits. No cervical lymphadenopathy is seen. IMPRESSION: 1. No evidence of traumatic intracranial injury or fracture. 2. No evidence of fracture or dislocation with regard to the maxillofacial structures. 3. Mild to moderate cortical volume loss and scattered small vessel ischemic microangiopathy. 4. Degenerative flattening at the right temporomandibular joint. 5. Mild calcification at the carotid bifurcation dilated. Carotid ultrasound could be considered for further evaluation, when and as deemed clinically appropriate. Electronically Signed   By: Roanna Raider M.D.   On: 05/11/2016 03:09   Dg Chest Portable 1 View  Result Date: 05/11/2016 CLINICAL DATA:  Left-sided chest wall pain after falling 1 day ago. EXAM: PORTABLE CHEST 1 VIEW COMPARISON:  05/01/2016 FINDINGS: There is unchanged cardiomegaly. Right lung is clear. There is unchanged left hemidiaphragm elevation. Left lung is clear except for minor linear scarring or atelectasis above the elevated  hemidiaphragm. Pulmonary vasculature is normal. IMPRESSION: Stable elevation of the left hemidiaphragm.  Unchanged cardiomegaly. Electronically Signed   By: Ellery Plunk M.D.   On: 05/11/2016 03:17   Ct Maxillofacial Wo Contrast  Result Date: 05/11/2016 CLINICAL DATA:  Acute onset of bradycardia and syncope. Status post fall face first onto floor. Hit forehead and nose. Initial encounter. EXAM: CT HEAD WITHOUT CONTRAST CT MAXILLOFACIAL WITHOUT CONTRAST TECHNIQUE: Multidetector CT imaging of the head and maxillofacial structures were performed using the standard protocol without intravenous contrast. Multiplanar CT image reconstructions of the maxillofacial structures were also generated. COMPARISON:  None. FINDINGS: CT HEAD FINDINGS Brain: No evidence of acute infarction, hemorrhage, hydrocephalus, extra-axial collection or mass lesion/mass effect. Prominence of the ventricles and sulci reflects mild to moderate cortical volume loss. Cerebellar atrophy is noted. Mild periventricular white matter change likely reflects small vessel ischemic microangiopathy. The brainstem and fourth ventricle are within normal limits. The basal ganglia are unremarkable in appearance. The cerebral hemispheres demonstrate grossly normal gray-white differentiation. No mass effect or midline shift is seen. Vascular: No hyperdense vessel or unexpected calcification. Skull: There is no evidence of fracture; visualized osseous structures are unremarkable in appearance. Other: No significant soft tissue abnormalities are seen. CT MAXILLOFACIAL FINDINGS Osseous: There is no evidence of fracture or dislocation. The maxilla and mandible appear intact. The nasal bone is unremarkable in appearance. The visualized dentition demonstrates no acute abnormality. Degenerative flattening is noted at the right temporomandibular joint. Orbits: The orbits are intact bilaterally. Sinuses: The visualized paranasal sinuses and mastoid air cells are  well-aerated. Soft tissues: No significant soft tissue abnormalities are seen. The parapharyngeal fat planes are preserved. The nasopharynx, oropharynx and hypopharynx are unremarkable in appearance. The visualized portions of the valleculae and piriform sinuses are grossly unremarkable. Mild calcification is seen at the carotid bifurcations bilaterally. The parotid and submandibular glands are within normal limits. No cervical lymphadenopathy is seen. IMPRESSION: 1. No evidence of traumatic intracranial injury or fracture. 2. No evidence of fracture  or dislocation with regard to the maxillofacial structures. 3. Mild to moderate cortical volume loss and scattered small vessel ischemic microangiopathy. 4. Degenerative flattening at the right temporomandibular joint. 5. Mild calcification at the carotid bifurcation dilated. Carotid ultrasound could be considered for further evaluation, when and as deemed clinically appropriate. Electronically Signed   By: Roanna Raider M.D.   On: 05/11/2016 03:09    Procedures Procedures (including critical care time)  Medications Ordered in ED Medications  atropine injection 0.4 mg (0.4 mg Intravenous Given 05/11/16 0148)  glucagon (human recombinant) (GLUCAGEN) injection 2 mg (2 mg Intravenous Given 05/11/16 0205)     Initial Impression / Assessment and Plan / ED Course  I have reviewed the triage vital signs and the nursing notes.  Pertinent labs & imaging results that were available during my care of the patient were reviewed by me and considered in my medical decision making (see chart for details).  Clinical Course    Patient presents to the ER by ambulance after a syncopal episode. Patient reports that he was in his bathroom brushing his teeth when he passed out. He does not remember passing out, woke up on the floor. He did not have any precursor chest pain, heart palpitations, shortness of breath. Rhythm strips and EKG provided by EMS were consistent with  third-degree heart block. This was confirmed with EKG here in the ER. Patient is normotensive, awake and alert, asymptomatic. Patient administered atropine and glucagon without improvement. Glucagon administered because he is on a high-dose of Lopressor. Cardiology has been consulted, patient will be admitted to the ICU and temporary pacer placed.  CRITICAL CARE Performed by: Melvin Crease   Total critical care time: 30 minutes  Critical care time was exclusive of separately billable procedures and treating other patients.  Critical care was necessary to treat or prevent imminent or life-threatening deterioration.  Critical care was time spent personally by me on the following activities: development of treatment plan with patient and/or surrogate as well as nursing, discussions with consultants, evaluation of patient's response to treatment, examination of patient, obtaining history from patient or surrogate, ordering and performing treatments and interventions, ordering and review of laboratory studies, ordering and review of radiographic studies, pulse oximetry and re-evaluation of patient's condition.   Final Clinical Impressions(s) / ED Diagnoses   Final diagnoses:  Third degree AV block (HCC)    New Prescriptions New Prescriptions   No medications on file    I personally performed the services described in this documentation, which was scribed in my presence. The recorded information has been reviewed and is accurate.    Melvin Crease, MD 05/11/16 [id number]    Melvin Crease, MD 05/11/16 [phone]

## 2016-05-11 NOTE — ED Notes (Signed)
No response after Atropine or Glucagon administration Cardiology at bedside

## 2016-05-11 NOTE — Consult Note (Signed)
Reason for Consult:complete heart block with prior TAVR  Referring Physician: Dr. Osvaldo HumanGanji  Melvin [name] is an 82 y.o. male.   HPI: The patient is an 82 yo man with AS, CAD, s/p CABG and chronic LBBB who underwent TAVR less than 2 weeks ago. He developed recurrent syncope and was admitted with CHB. He had a temporary PM placed but has had difficulty with capture and I have been called to see regarding placement of a PPM. The patient was on 100 bid of lopressor which he had tolerated for years. His last dose was 11 hours ago. He has had spontaneous return of his AV conduction, now with LBBB at 65/min. He has known ischemic CM with an EF of 30-35%.    PMH: Past Medical History:  Diagnosis Date  . Age-related macular degeneration, dry, both eyes   . Arthritis    "just about all over my body"  . Chronic combined systolic and diastolic CHF (congestive heart failure) (HCC)    a. 11/2015 Echo: EF 30-35%, diff HK, Gr1 DD.  Marland Kitchen. Coronary artery disease    a. 1991 PTCA of LAD; b. 1998 CABG x 5 (LIMA->LAD, VG->RPDA->RPLA, VG->OM1, VG->D2; c. 09/2014 MV: lat ischemia, EF 37%; d. 10/2014 PCI: LAD 100, LCX 99ost/100p, RCA 100p, LIMA->LAD ok, VG->D2 ok, VG->OM1 95(3.0x15 Resolute Integrity DES), VG->PDA ok w/ 30% w/in VG->PL.  Marland Kitchen. GERD (gastroesophageal reflux disease)   . High cholesterol   . History of hiatal hernia   . Hypertension   . Ischemic cardiomyopathy    a. 11/2015 Echo: EF 30-35%, diff HK, Gr1 DD.  Marland Kitchen. LBBB (left bundle branch block)   . Moderate aortic stenosis    a. 11/2015 Echo: EF 30-35%, diff HK, Gr1 DD, mod AS, mild AI, mildly dil Ao root and Asc Ao, mild MR, mod dil LA, PASP 46mmHg.  . Osteoporosis   . PAD (peripheral artery disease) (HCC)    a. s/p prior rotational atherectomy of L pop and tib trunk and PTA of focal popliteal lesions; b. 03/2015 ABI: R - 0.88, L 0.96, LCFA 50-79%, RSFA 50-79% distal stenosis, 3 vessel runoff.  . Palpitations    a. 06/2012 Event monitor: freq ventricular  ectopy w/o VT.  Marland Kitchen. PVC (premature ventricular contraction) 04/26/1997   on beta blocker/pt had cardio net monitor 06/23/2012  . RLS (restless legs syndrome) 10/01/2012  . Type II diabetes mellitus (HCC)   . Unspecified hereditary and idiopathic peripheral neuropathy 10/01/2012    PSHX: Past Surgical History:  Procedure Laterality Date  . APPENDECTOMY    . CARDIAC CATHETERIZATION  04/26/1997   CAD - 50-60% prox to mid LAD with 70% stenosis in mid-distal, 70-80% 1st diag stenosis, 50-60% ostial stenosis in large 2nd diagonal; diffuse RCA stenosis with 40-50% osital narrowing with 70% prox stenosis (Dr. Bishop Limbo. Kelly) >> CABG  . CARDIAC CATHETERIZATION  11/01/2014  . CARDIAC CATHETERIZATION N/A 04/05/2016   Procedure: Right/Left Heart Cath and Coronary/Graft Angiography;  Surgeon: Lennette Biharihomas A Kelly, MD;  [facility];  Service: Cardiovascular;  Laterality: N/A;  . CATARACT EXTRACTION W/ INTRAOCULAR LENS  IMPLANT, BILATERAL Bilateral   . CORONARY ARTERY BYPASS GRAFT  04/1997   CABGx5 - LIMA to LAD, SVG to PDA, SVG to PLA, VG to OM1, VG to 2nd diagonal (Dr. Dorris FetchHendrickson)  . DOPPLER ECHOCARDIOGRAPHY  05/18/2012   EF 45-50%, LV systolic function mildly reduced; borderline LA enlargment; mild mitral annular calcif, mild-mod MR; mild TR with normal RVSP; mild calcification of AV leaflets and  mild-mod valvular AS with mild regurg      . DOPPLER ECHOCARDIOGRAPHY  08/19/2011   EF 50-55%, mod conc LVH; mild mitral annular calcif with mild MR; mild-mod TR; AV mildly sclerotic with mild valvular AS and mild regurg, mild aortic root dilatation  . HERNIA REPAIR    . HIATAL HERNIA REPAIR    . INGUINAL HERNIA REPAIR Bilateral   . LEFT AND RIGHT HEART CATHETERIZATION WITH CORONARY ANGIOGRAM N/A 11/01/2014   Procedure: LEFT AND RIGHT HEART CATHETERIZATION WITH CORONARY ANGIOGRAM;  Surgeon: Lennette Bihari, MD; EF35-40%, mild AS w/ 2+ AI, LAD, CFX, RCA 100%, LIMA-LAD OK, SVG-D1 OK, SVG-PDA 30%, SVG-OM 95%  .  LOWER EXTREMITY ANGIOGRAM  02/05/2011   Diamondback orbital rotational atherectomy, percutaneous transluminal angioplasty of high-grade calcified popliteal & tibioperoneal stenosis (Dr. Erlene Quan)  . LOWER EXTREMITY ARTERIAL DOPPLER  02/2012   RLE - mild to mod arterial insuff; RSFA 50-69% diameter reduction; R pop 50-69% diameter reduction; posterior tibial (R) demonstrates occlusive disease; LSFA/prox pop 0-49% diameter reduction; L distal pop 50-69% diameter reduction; posterior tibial (L) demonstrates occlusive disease  . NM MYOCAR PERF EJECTION FRACTION  09/2011   lexiscan - no reversible ischmai, fixed anteroseptal defect r/t LBBB; EF 51%; septal hypokinesis; low risk but abnormal  . PERCUTANEOUS CORONARY STENT INTERVENTION (PCI-S) N/A 11/02/2014   Procedure: PERCUTANEOUS CORONARY STENT INTERVENTION (PCI-S);  Surgeon: Lennette Bihari, MD; 3.015 mm Resolute integrity DES to the SVG-OM   . TEE WITHOUT CARDIOVERSION N/A 04/30/2016   Procedure: TRANSESOPHAGEAL ECHOCARDIOGRAM (TEE);  Surgeon: Kathleene Hazel, MD;  Location: [facility] OR;  Service: Open Heart Surgery;  Laterality: N/A;  . TONSILLECTOMY    . TRANSCATHETER AORTIC VALVE REPLACEMENT, TRANSFEMORAL N/A 04/30/2016   Procedure: TRANSCATHETER AORTIC VALVE REPLACEMENT, TRANSFEMORAL;  Surgeon: Kathleene Hazel, MD;  Location: [facility] OR;  Service: Open Heart Surgery;  Laterality: N/A;    FAMHX: Family History  Problem Relation Age of Onset  . CAD Mother   . Heart attack Father     Social History:  reports that he has never smoked. He has never used smokeless tobacco. He reports that he does not drink alcohol or use drugs.  Allergies:  Allergies  Allergen Reactions  . Other     Patient is allergic to something but doesn't know the name.  . Pramipexole Swelling    Medications: I have reviewed the patient's current medications.  Ct Head Wo Contrast  Result Date: 05/11/2016 CLINICAL DATA:  Acute onset of bradycardia and syncope.  Status post fall face first onto floor. Hit forehead and nose. Initial encounter. EXAM: CT HEAD WITHOUT CONTRAST CT MAXILLOFACIAL WITHOUT CONTRAST TECHNIQUE: Multidetector CT imaging of the head and maxillofacial structures were performed using the standard protocol without intravenous contrast. Multiplanar CT image reconstructions of the maxillofacial structures were also generated. COMPARISON:  None. FINDINGS: CT HEAD FINDINGS Brain: No evidence of acute infarction, hemorrhage, hydrocephalus, extra-axial collection or mass lesion/mass effect. Prominence of the ventricles and sulci reflects mild to moderate cortical volume loss. Cerebellar atrophy is noted. Mild periventricular white matter change likely reflects small vessel ischemic microangiopathy. The brainstem and fourth ventricle are within normal limits. The basal ganglia are unremarkable in appearance. The cerebral hemispheres demonstrate grossly normal gray-white differentiation. No mass effect or midline shift is seen. Vascular: No hyperdense vessel or unexpected calcification. Skull: There is no evidence of fracture; visualized osseous structures are unremarkable in appearance. Other: No significant soft tissue abnormalities are seen. CT MAXILLOFACIAL FINDINGS Osseous: There is no  evidence of fracture or dislocation. The maxilla and mandible appear intact. The nasal bone is unremarkable in appearance. The visualized dentition demonstrates no acute abnormality. Degenerative flattening is noted at the right temporomandibular joint. Orbits: The orbits are intact bilaterally. Sinuses: The visualized paranasal sinuses and mastoid air cells are well-aerated. Soft tissues: No significant soft tissue abnormalities are seen. The parapharyngeal fat planes are preserved. The nasopharynx, oropharynx and hypopharynx are unremarkable in appearance. The visualized portions of the valleculae and piriform sinuses are grossly unremarkable. Mild calcification is seen at  the carotid bifurcations bilaterally. The parotid and submandibular glands are within normal limits. No cervical lymphadenopathy is seen. IMPRESSION: 1. No evidence of traumatic intracranial injury or fracture. 2. No evidence of fracture or dislocation with regard to the maxillofacial structures. 3. Mild to moderate cortical volume loss and scattered small vessel ischemic microangiopathy. 4. Degenerative flattening at the right temporomandibular joint. 5. Mild calcification at the carotid bifurcation dilated. Carotid ultrasound could be considered for further evaluation, when and as deemed clinically appropriate. Electronically Signed   By: Roanna RaiderJeffery  Chang M.D.   On: 05/11/2016 03:09   Dg Chest Portable 1 View  Result Date: 05/11/2016 CLINICAL DATA:  Left-sided chest wall pain after falling 1 day ago. EXAM: PORTABLE CHEST 1 VIEW COMPARISON:  05/01/2016 FINDINGS: There is unchanged cardiomegaly. Right lung is clear. There is unchanged left hemidiaphragm elevation. Left lung is clear except for minor linear scarring or atelectasis above the elevated hemidiaphragm. Pulmonary vasculature is normal. IMPRESSION: Stable elevation of the left hemidiaphragm.  Unchanged cardiomegaly. Electronically Signed   By: Ellery Plunkaniel R Mitchell M.D.   On: 05/11/2016 03:17   Ct Maxillofacial Wo Contrast  Result Date: 05/11/2016 CLINICAL DATA:  Acute onset of bradycardia and syncope. Status post fall face first onto floor. Hit forehead and nose. Initial encounter. EXAM: CT HEAD WITHOUT CONTRAST CT MAXILLOFACIAL WITHOUT CONTRAST TECHNIQUE: Multidetector CT imaging of the head and maxillofacial structures were performed using the standard protocol without intravenous contrast. Multiplanar CT image reconstructions of the maxillofacial structures were also generated. COMPARISON:  None. FINDINGS: CT HEAD FINDINGS Brain: No evidence of acute infarction, hemorrhage, hydrocephalus, extra-axial collection or mass lesion/mass effect. Prominence  of the ventricles and sulci reflects mild to moderate cortical volume loss. Cerebellar atrophy is noted. Mild periventricular white matter change likely reflects small vessel ischemic microangiopathy. The brainstem and fourth ventricle are within normal limits. The basal ganglia are unremarkable in appearance. The cerebral hemispheres demonstrate grossly normal gray-white differentiation. No mass effect or midline shift is seen. Vascular: No hyperdense vessel or unexpected calcification. Skull: There is no evidence of fracture; visualized osseous structures are unremarkable in appearance. Other: No significant soft tissue abnormalities are seen. CT MAXILLOFACIAL FINDINGS Osseous: There is no evidence of fracture or dislocation. The maxilla and mandible appear intact. The nasal bone is unremarkable in appearance. The visualized dentition demonstrates no acute abnormality. Degenerative flattening is noted at the right temporomandibular joint. Orbits: The orbits are intact bilaterally. Sinuses: The visualized paranasal sinuses and mastoid air cells are well-aerated. Soft tissues: No significant soft tissue abnormalities are seen. The parapharyngeal fat planes are preserved. The nasopharynx, oropharynx and hypopharynx are unremarkable in appearance. The visualized portions of the valleculae and piriform sinuses are grossly unremarkable. Mild calcification is seen at the carotid bifurcations bilaterally. The parotid and submandibular glands are within normal limits. No cervical lymphadenopathy is seen. IMPRESSION: 1. No evidence of traumatic intracranial injury or fracture. 2. No evidence of fracture or dislocation with regard to the  maxillofacial structures. 3. Mild to moderate cortical volume loss and scattered small vessel ischemic microangiopathy. 4. Degenerative flattening at the right temporomandibular joint. 5. Mild calcification at the carotid bifurcation dilated. Carotid ultrasound could be considered for  further evaluation, when and as deemed clinically appropriate. Electronically Signed   By: Roanna Raider M.D.   On: 05/11/2016 03:09    ROS  As stated in the HPI and negative for all other systems.  Physical Exam  Vitals:Blood pressure (!) 111/59, pulse (!) 50, temperature 97.6 F (36.4 C), temperature source Oral, resp. rate (!) 23, height 5\' 6"  (1.676 m), weight 172 lb 9.9 oz (78.3 kg), SpO2 95 %.  Well appearing NAD HEENT: Unremarkable Neck:  No JVD, no thyromegally Lymphatics:  No adenopathy Back:  No CVA tenderness Lungs:  Clear HEART:  Regular rate rhythm, no murmurs, no rubs, no clicks Abd:  Flat, positive bowel sounds, no organomegally, no rebound, no guarding Ext:  2 plus pulses, no edema, no cyanosis, no clubbing Skin:  No rashes no nodules Neuro:  CN II through XII intact, motor grossly intact  Assessment/Plan: 1. Complete heart block - the patient had severe conduction system disease prior to TAVR. He is on high dose metoprolol and is now showing signs that it might be washing out. Hopefully we can avoid PPM although I suspect he will need one in the long term with his known conduction system disease. His advanced aged would preclude an ICD but a BiV PPM would be a consideration in the future, assuming his conduction does not go away again and he need PPM this admit. Might consider placing an ILR if no need to place a PPM this admit. 2. CAD - no evidence that coronary ischemia is going on 3. Chronic systolic heart failure - he appears stable. Will have to be careful with his volume status. 4. Disp. - watch in ICU today.   Ramaj Frangos,M.D. Wylee Dorantes TaylorMD 05/11/2016, 7:00 AM

## 2016-05-11 NOTE — Progress Notes (Signed)
CRITICAL VALUE ALERT  Critical value received:  Troponin  0.06  Date of notification:  05/11/2013  Time of notification:  0615  Critical value read back:Yes.    Nurse who received alert:  Gerre PebblesMavis Arlone Lenhardt ,RN  MD notified (1st page):  Dr Ardyth GalJanji ( informed at bed side )  Time of first page:  0615  MD notified (2nd page):  Time of second page:  Responding MD:  Dr Ardyth GalJanji   Time MD responded:  [phone]

## 2016-05-11 NOTE — ED Notes (Signed)
Received call from patient placement, pt has not been accepted to [facility] at this time.

## 2016-05-12 LAB — GLUCOSE, CAPILLARY
GLUCOSE-CAPILLARY: 139 mg/dL — AB (ref 65–99)
GLUCOSE-CAPILLARY: 169 mg/dL — AB (ref 65–99)
Glucose-Capillary: 154 mg/dL — ABNORMAL HIGH (ref 65–99)
Glucose-Capillary: 146 mg/dL — ABNORMAL HIGH (ref 65–99)

## 2016-05-12 MED ORDER — ACETAMINOPHEN 325 MG PO TABS
650.0000 mg | ORAL_TABLET | Freq: Four times a day (QID) | ORAL | Status: DC | PRN
  Administered 2016-05-12 – 2016-05-13 (×2): 650 mg via ORAL
  Filled 2016-05-12 (×2): qty 2

## 2016-05-12 MED ORDER — FLUTICASONE PROPIONATE 50 MCG/ACT NA SUSP
2.0000 | Freq: Every day | NASAL | Status: DC
  Administered 2016-05-12 – 2016-05-14 (×3): 2 via NASAL
  Filled 2016-05-12: qty 16

## 2016-05-12 NOTE — Progress Notes (Signed)
Patient Name: Melvin [name] Date of Encounter: 05/12/2016  Hospital Problem List     Principal Problem:   Complete heart block [facility]) Active Problems:   Hypertension   Edema, lower extremity   CAD in native artery   S/P TAVR (transcatheter aortic valve replacement)    Patient Profile     The patient is an 82 yo man with AS, CAD, s/p CABG and chronic LBBB who underwent TAVR less than 2 weeks ago. He developed recurrent syncope and was admitted with CHB. He had a temporary PM placed   Subjective   Feels OK.  No SOB.  No pain.   Inpatient Medications    . aspirin  81 mg Oral QPM  . atorvastatin  40 mg Oral q1800  . clopidogrel  75 mg Oral Daily  . heparin  5,000 Units Subcutaneous Q8H  . insulin aspart  0-15 Units Subcutaneous TID WC  . insulin aspart  0-5 Units Subcutaneous QHS  . sodium chloride flush  10-40 mL Intracatheter Q12H    Vital Signs    Vitals:   05/12/16 0900 05/12/16 1000 05/12/16 1100 05/12/16 1130  BP: (!) 171/100 136/69 (!) 155/69   Pulse: 86 84 95   Resp: (!) 24 19 20    Temp:    98 F (36.7 C)  TempSrc:    Oral  SpO2: 97% 98% 99%   Weight:      Height:        Intake/Output Summary (Last 24 hours) at 05/12/16 1158 Last data filed at 05/12/16 1100  Gross per 24 hour  Intake            578.4 ml  Output             3140 ml  Net          -2561.6 ml   Filed Weights   05/11/16 0155 05/11/16 0445 05/12/16 0346  Weight: 162 lb (73.5 kg) 172 lb 9.9 oz (78.3 kg) 174 lb 13.2 oz (79.3 kg)    Physical Exam    GEN: Well nourished, well developed, in no acute distress.  Neck: Supple, no JVD, carotid bruits, or masses. Cardiac: RRR, no rubs, or gallops. No clubbing, cyanosis, mild ankle edema.  Radials/DP/PT 2+ and equal bilaterally.  Respiratory:  Respirations  regular and unlabored, clear to auscultation bilaterally. GI: Soft, nontender, nondistended, BS + x 4. Neuro:  Strength and sensation are intact.   Labs    CBC  Recent Labs  05/11/16 0140 05/11/16 0440  WBC 10.5 11.4*  NEUTROABS 7.7  --   HGB 11.8* 10.8*  HCT 34.6* 32.4*  MCV 91.8 91.0  PLT 192 170   Basic Metabolic Panel  Recent Labs  05/11/16 0140 05/11/16 0440 05/11/16 0930  NA 128*  --  131*  K 4.9  --  4.5  CL 91*  --  96*  CO2 27  --  27  GLUCOSE 174*  --  144*  BUN 28*  --  26*  CREATININE 1.19 1.12 1.02  CALCIUM 9.9  --  9.0  MG  --  2.0  --    Liver Function Tests No results for input(s): AST, ALT, ALKPHOS, BILITOT, PROT, ALBUMIN in the last 72 hours. No results for input(s): LIPASE, AMYLASE in the last 72 hours. Cardiac Enzymes  Recent Labs  05/11/16 0440  TROPONINI 0.06*   BNP Invalid input(s): POCBNP D-Dimer No results for input(s): DDIMER in the last 72 hours. Hemoglobin A1C No results for  input(s): HGBA1C in the last 72 hours. Fasting Lipid Panel No results for input(s): CHOL, HDL, LDLCALC, TRIG, CHOLHDL, LDLDIRECT in the last 72 hours. Thyroid Function Tests No results for input(s): TSH, T4TOTAL, T3FREE, THYROIDAB in the last 72 hours.  Invalid input(s): FREET3  Telemetry    NSR  ECG    NA  Radiology    Dg Chest 2 View  Result Date: 04/26/2016 CLINICAL DATA:  Preoperative evaluation for upcoming TAVR EXAM: CHEST  2 VIEW COMPARISON:  10/30/2015 FINDINGS: Elevation of left hemidiaphragm is again identified. Postsurgical changes are again seen. Cardiac shadow is again enlarged but stable. No focal infiltrate or sizable effusion is seen. No acute bony abnormality is seen. IMPRESSION: No active cardiopulmonary disease. Electronically Signed   By: Alcide Clever M.D.   On: 04/26/2016 15:17   Ct Head Wo Contrast  Result Date: 05/11/2016 CLINICAL DATA:  Acute onset of bradycardia and syncope. Status post fall face first onto floor. Hit forehead and nose. Initial encounter. EXAM: CT HEAD WITHOUT CONTRAST CT MAXILLOFACIAL WITHOUT CONTRAST TECHNIQUE: Multidetector CT imaging of the head and maxillofacial structures  were performed using the standard protocol without intravenous contrast. Multiplanar CT image reconstructions of the maxillofacial structures were also generated. COMPARISON:  None. FINDINGS: CT HEAD FINDINGS Brain: No evidence of acute infarction, hemorrhage, hydrocephalus, extra-axial collection or mass lesion/mass effect. Prominence of the ventricles and sulci reflects mild to moderate cortical volume loss. Cerebellar atrophy is noted. Mild periventricular white matter change likely reflects small vessel ischemic microangiopathy. The brainstem and fourth ventricle are within normal limits. The basal ganglia are unremarkable in appearance. The cerebral hemispheres demonstrate grossly normal gray-white differentiation. No mass effect or midline shift is seen. Vascular: No hyperdense vessel or unexpected calcification. Skull: There is no evidence of fracture; visualized osseous structures are unremarkable in appearance. Other: No significant soft tissue abnormalities are seen. CT MAXILLOFACIAL FINDINGS Osseous: There is no evidence of fracture or dislocation. The maxilla and mandible appear intact. The nasal bone is unremarkable in appearance. The visualized dentition demonstrates no acute abnormality. Degenerative flattening is noted at the right temporomandibular joint. Orbits: The orbits are intact bilaterally. Sinuses: The visualized paranasal sinuses and mastoid air cells are well-aerated. Soft tissues: No significant soft tissue abnormalities are seen. The parapharyngeal fat planes are preserved. The nasopharynx, oropharynx and hypopharynx are unremarkable in appearance. The visualized portions of the valleculae and piriform sinuses are grossly unremarkable. Mild calcification is seen at the carotid bifurcations bilaterally. The parotid and submandibular glands are within normal limits. No cervical lymphadenopathy is seen. IMPRESSION: 1. No evidence of traumatic intracranial injury or fracture. 2. No evidence  of fracture or dislocation with regard to the maxillofacial structures. 3. Mild to moderate cortical volume loss and scattered small vessel ischemic microangiopathy. 4. Degenerative flattening at the right temporomandibular joint. 5. Mild calcification at the carotid bifurcation dilated. Carotid ultrasound could be considered for further evaluation, when and as deemed clinically appropriate. Electronically Signed   By: Roanna Raider M.D.   On: 05/11/2016 03:09   Ct Coronary Morph W/cta Cor W/score W/ca W/cm &/or Wo/cm  Addendum Date: 04/15/2016   ADDENDUM REPORT: 04/15/2016 16:58 CLINICAL DATA:  82 year old male with severe aortic stenosis. EXAM: Cardiac TAVR CT TECHNIQUE: The patient was scanned on a Philips 256 scanner. A 120 kV retrospective scan was triggered in the descending thoracic aorta at 111 HU's. Gantry rotation speed was 270 msecs and collimation was .9 mm. No beta blockade or nitro were given. The 3D data  set was reconstructed in 5% intervals of the R-R cycle. Systolic and diastolic phases were analyzed on a dedicated work station using MPR, MIP and VRT modes. The patient received 80 cc of contrast. FINDINGS: Aortic Valve: Severely thickened and calcified with severe asymmetric calcification under the left coronary cusp extending into the LVOT. Aorta:  Normal size, no dissection, moderate diffuse calcifications. Sinotubular Junction:  37 x 32 mm Ascending Thoracic Aorta:  38 x 38 mm Aortic Arch:  30 x 28 mm Descending Thoracic Aorta:  28 x 26 mm Sinus of Valsalva Measurements: Non-coronary:  39 mm Right -coronary:  37 mm Left -coronary:  37 mm Coronary Artery Height above Annulus: Left Main:  14 mm Right Coronary:  17 mm Virtual Basal Annulus Measurements: Maximum/Minimum Diameter:  30 x 26 mm Perimeter:  86 mm Area:  580 mm2 Optimum Fluoroscopic Angle for Delivery:  RAO 1 CRA 0 IMPRESSION: 1. Severely calcified and thickened tricuspid aortic valve with severe asymmetric calcification under the  left coronary cusp extending into the LVOT and annular measurements suitable for delivery of 29 mmEdwards-SAPIEN 3 valve. 2.  Sufficient coronary to annulus distance. 3. Optimum Fluoroscopic Angle for Delivery:  RAO 1 CRA 0 4.  No thrombus in the left atrial appendage. Tobias Alexander Electronically Signed   By: Tobias Alexander   On: 04/15/2016 16:58   Result Date: 04/15/2016 EXAM: OVER-READ INTERPRETATION  CT CHEST The following report is an over-read performed by radiologist Dr. [facility] on 04/15/2016. This over-read does not include interpretation of cardiac or coronary anatomy or pathology. The coronary calcium score/coronary CTA interpretation by the cardiologist is attached. COMPARISON:  None. FINDINGS: Extracardiac findings will be dictated separately under accession number for contemporaneously obtained CTA of the chest, abdomen and pelvis 04/15/2016. IMPRESSION: Please see separate dictation for contemporaneously obtained CTA of the chest, abdomen and pelvis 04/15/2016 for full description of extracardiac findings. Electronically Signed: By: Trudie Reed M.D. On: 04/15/2016 15:49   Dg Chest Portable 1 View  Result Date: 05/11/2016 CLINICAL DATA:  Left-sided chest wall pain after falling 1 day ago. EXAM: PORTABLE CHEST 1 VIEW COMPARISON:  05/01/2016 FINDINGS: There is unchanged cardiomegaly. Right lung is clear. There is unchanged left hemidiaphragm elevation. Left lung is clear except for minor linear scarring or atelectasis above the elevated hemidiaphragm. Pulmonary vasculature is normal. IMPRESSION: Stable elevation of the left hemidiaphragm.  Unchanged cardiomegaly. Electronically Signed   By: Ellery Plunk M.D.   On: 05/11/2016 03:17   Dg Chest Port 1 View  Result Date: 05/01/2016 CLINICAL DATA:  Status post aortic valve repair EXAM: PORTABLE CHEST 1 VIEW COMPARISON:  04/30/2016 FINDINGS: Cardiomegaly again noted. Aortic valve prosthesis is unchanged  in position. Stable right IJ central line position. Central mild vascular congestion without convincing pulmonary edema. Persistent small left pleural effusion left basilar atelectasis or infiltrate. IMPRESSION: Aortic valve prosthesis is unchanged in position. Stable right IJ central line position. Central mild vascular congestion without convincing pulmonary edema. Persistent small left pleural effusion left basilar atelectasis or infiltrate. Electronically Signed   By: Natasha Mead M.D.   On: 05/01/2016 09:09   Dg Chest Port 1 View  Result Date: 04/30/2016 CLINICAL DATA:  Postop T AVR EXAM: PORTABLE CHEST 1 VIEW COMPARISON:  04/26/2016 FINDINGS: Changes of aortic valve repair. Prior CABG. Right central line tip is in the SVC. No pneumothorax. There is cardiomegaly. Left lower lobe atelectasis or infiltrate. Possible small left effusion. No confluent opacity on the right. IMPRESSION:  Right central line tip in the SVC.  No pneumothorax. Left lower lobe atelectasis or infiltrate. Cardiomegaly. Electronically Signed   By: Charlett Nose M.D.   On: 04/30/2016 10:47   Ct Angio Chest Aorta W/cm &/or Wo/cm  Result Date: 04/15/2016 CLINICAL DATA:  82 year old male with history of severe aortic stenosis. Preprocedural study prior to potential transcatheter aortic valve replacement (TAVR). EXAM: CT ANGIOGRAPHY CHEST, ABDOMEN AND PELVIS TECHNIQUE: Multidetector CT imaging through the chest, abdomen and pelvis was performed using the standard protocol during bolus administration of intravenous contrast. Multiplanar reconstructed images and MIPs were obtained and reviewed to evaluate the vascular anatomy. CONTRAST:  150 mL of Isovue 370. COMPARISON:  No priors. FINDINGS: CTA CHEST FINDINGS Cardiovascular: Heart size is enlarged with left ventricular dilatation. There is no significant pericardial fluid, thickening or pericardial calcification. There is aortic atherosclerosis, as well as atherosclerosis of the great  vessels of the mediastinum and the coronary arteries, including calcified atherosclerotic plaque in the left main, left anterior descending, left circumflex and right coronary arteries. Status post median sternotomy for CABG, including LIMA to the LAD. Thickening calcification of the aortic valve. Calcification of the anterior leaflet of the mitral valve. Mediastinum/Lymph Nodes: No pathologically enlarged mediastinal or hilar lymph nodes. Esophagus is unremarkable in appearance. No axillary lymphadenopathy. Lungs/Pleura: Incomplete visualization of the lung apices. Within the visualized portions of the lungs there are no suspicious appearing pulmonary nodules or masses, there is no acute consolidative airspace disease, no pleural effusions and no pneumothorax. Areas of mild scarring are noted in the anterior aspect of the left upper lobe. Musculoskeletal/Soft Tissues: Median sternotomy wires. There are no aggressive appearing lytic or blastic lesions noted in the visualized portions of the skeleton. CTA ABDOMEN AND PELVIS FINDINGS Hepatobiliary: 1.7 cm low-attenuation lesion in segment 7 of the liver with peripheral nodular enhancement, incompletely characterized, but likely a cavernous hemangioma. Sub cm low-attenuation lesion in segment 8 too small to characterize, but likely a cyst. No intra or extrahepatic biliary ductal dilatation. Gallbladder is normal in appearance. Pancreas: No pancreatic mass. No pancreatic ductal dilatation. No pancreatic or peripancreatic fluid or inflammatory changes. Spleen: Unremarkable. Adrenals/Urinary Tract: 2.1 cm simple cyst extending off the posterior aspect of the interpolar region of the left kidney. Other sub cm low-attenuation lesions in both kidneys are too small to definitively characterize, but are favored to represent tiny cysts. There is also a 1.4 cm high attenuation (56 HU) lesion extending exophytically from the medial aspect of the upper pole of the right kidney  (image 90 of series 4). Additionally, there are 2 intermediate attenuation lesions in the lower poles of the kidneys bilaterally measuring 1.7 cm on the right (image 113 of series 4) and 1.8 cm on the left (image 96 of series 4). Bilateral adrenal glands are normal in appearance. No hydroureteronephrosis. Urinary bladder is unremarkable in appearance. Stomach/Bowel: Normal appearance of the stomach. No pathologic dilatation of small bowel or colon. Several scattered colonic diverticulae are noted, without surrounding inflammatory changes to suggest an acute diverticulitis at this time. The appendix is not confidently identified and may be surgically absent. Regardless, there are no inflammatory changes noted adjacent to the cecum to suggest the presence of an acute appendicitis at this time. Vascular/Lymphatic: Aortic atherosclerosis, without evidence of aneurysm or dissection in the abdominal or pelvic vasculature. Vascular findings and measurements pertinent to potential TAVR procedure, as detailed below. Celiac axis, superior mesenteric artery and inferior mesenteric artery and their major branches are all widely patent,  although there appears to be mild stenosis in the proximal superior mesenteric artery (less than 50% diameter stenosis). Single renal arteries bilaterally, both widely patent. No lymphadenopathy noted in the abdomen or pelvis. Reproductive: Prostate gland and seminal vesicles are unremarkable in appearance. Other: Epigastric ventral hernia containing only omental fat. No associated bowel incarceration or obstruction at this time. No significant volume of ascites. No pneumoperitoneum. Musculoskeletal: There are no aggressive appearing lytic or blastic lesions noted in the visualized portions of the skeleton. VASCULAR MEASUREMENTS PERTINENT TO TAVR: AORTA: Minimal Aortic Diameter -  16 x 15 mm Severity of Aortic Calcification -  moderate to severe RIGHT PELVIS: Right Common Iliac Artery - Minimal  Diameter - 9.9 x 7.3 mm Tortuosity - mild Calcification - moderate Right External Iliac Artery - Minimal Diameter - 8.9 x 4.8 mm Tortuosity - mild Calcification - mild Right Common Femoral Artery - Minimal Diameter - 7.5 x 6.2 mm Tortuosity - mild Calcification - moderate LEFT PELVIS: Left Common Iliac Artery - Minimal Diameter - 9.9 x 7.8 mm Tortuosity - mild Calcification - mild Left External Iliac Artery - Minimal Diameter - 8.7 x 8.5 mm Tortuosity - mild Calcification - mild Left Common Femoral Artery - Minimal Diameter - 9.3 x 9.6 mm Tortuosity - mild Calcification - mild Review of the MIP images confirms the above findings. IMPRESSION: 1. Vascular findings and measurements pertinent to potential TAVR procedure, as detailed above. This patient appears to have suitable pelvic arterial access bilaterally. 2. Thickening calcification of the aortic valve, compatible with the reported clinical history of severe aortic stenosis. 3. Cardiomegaly with left ventricular dilatation. 4. Aortic atherosclerosis, in addition to left main and 3 vessel coronary artery disease.Status post median sternotomy for CABG, including LIMA to the LAD. 5. Several indeterminate lesions in the kidneys bilaterally, favored to represent proteinaceous cysts. The lesion in the medial aspect of the upper pole of the right kidney and in the lower pole the right kidney both appear relatively stable in size compared to prior study 11/19/2011, making the likelihood of aggressive lesions very low. By comparison, the lesion in the medial aspect of the lower pole of the left kidney is new compared to the prior examination and therefore more concerning. These are considered Bosniak class 45F lesions, and warrant follow-up evaluation with MRI of the abdomen with and without IV gadolinium in 6 months to ensure the stability of these lesions and to provide better characterization. This recommendation follows ACR consensus guidelines: Management of the  Incidental Renal Mass on CT: A White Paper of the ACR Incidental Findings Committee. J Am Coll Radiol 2017; article in press. 6. Additional incidental findings, as above. Electronically Signed   By: Trudie Reedaniel  Entrikin M.D.   On: 04/15/2016 17:03   Ct Maxillofacial Wo Contrast  Result Date: 05/11/2016 CLINICAL DATA:  Acute onset of bradycardia and syncope. Status post fall face first onto floor. Hit forehead and nose. Initial encounter. EXAM: CT HEAD WITHOUT CONTRAST CT MAXILLOFACIAL WITHOUT CONTRAST TECHNIQUE: Multidetector CT imaging of the head and maxillofacial structures were performed using the standard protocol without intravenous contrast. Multiplanar CT image reconstructions of the maxillofacial structures were also generated. COMPARISON:  None. FINDINGS: CT HEAD FINDINGS Brain: No evidence of acute infarction, hemorrhage, hydrocephalus, extra-axial collection or mass lesion/mass effect. Prominence of the ventricles and sulci reflects mild to moderate cortical volume loss. Cerebellar atrophy is noted. Mild periventricular white matter change likely reflects small vessel ischemic microangiopathy. The brainstem and fourth ventricle are within normal limits.  The basal ganglia are unremarkable in appearance. The cerebral hemispheres demonstrate grossly normal gray-white differentiation. No mass effect or midline shift is seen. Vascular: No hyperdense vessel or unexpected calcification. Skull: There is no evidence of fracture; visualized osseous structures are unremarkable in appearance. Other: No significant soft tissue abnormalities are seen. CT MAXILLOFACIAL FINDINGS Osseous: There is no evidence of fracture or dislocation. The maxilla and mandible appear intact. The nasal bone is unremarkable in appearance. The visualized dentition demonstrates no acute abnormality. Degenerative flattening is noted at the right temporomandibular joint. Orbits: The orbits are intact bilaterally. Sinuses: The visualized  paranasal sinuses and mastoid air cells are well-aerated. Soft tissues: No significant soft tissue abnormalities are seen. The parapharyngeal fat planes are preserved. The nasopharynx, oropharynx and hypopharynx are unremarkable in appearance. The visualized portions of the valleculae and piriform sinuses are grossly unremarkable. Mild calcification is seen at the carotid bifurcations bilaterally. The parotid and submandibular glands are within normal limits. No cervical lymphadenopathy is seen. IMPRESSION: 1. No evidence of traumatic intracranial injury or fracture. 2. No evidence of fracture or dislocation with regard to the maxillofacial structures. 3. Mild to moderate cortical volume loss and scattered small vessel ischemic microangiopathy. 4. Degenerative flattening at the right temporomandibular joint. 5. Mild calcification at the carotid bifurcation dilated. Carotid ultrasound could be considered for further evaluation, when and as deemed clinically appropriate. Electronically Signed   By: Roanna Raider M.D.   On: 05/11/2016 03:09   Ct Angio Abd/pel W/ And/or W/o  Result Date: 04/15/2016 CLINICAL DATA:  82 year old male with history of severe aortic stenosis. Preprocedural study prior to potential transcatheter aortic valve replacement (TAVR). EXAM: CT ANGIOGRAPHY CHEST, ABDOMEN AND PELVIS TECHNIQUE: Multidetector CT imaging through the chest, abdomen and pelvis was performed using the standard protocol during bolus administration of intravenous contrast. Multiplanar reconstructed images and MIPs were obtained and reviewed to evaluate the vascular anatomy. CONTRAST:  150 mL of Isovue 370. COMPARISON:  No priors. FINDINGS: CTA CHEST FINDINGS Cardiovascular: Heart size is enlarged with left ventricular dilatation. There is no significant pericardial fluid, thickening or pericardial calcification. There is aortic atherosclerosis, as well as atherosclerosis of the great vessels of the mediastinum and the  coronary arteries, including calcified atherosclerotic plaque in the left main, left anterior descending, left circumflex and right coronary arteries. Status post median sternotomy for CABG, including LIMA to the LAD. Thickening calcification of the aortic valve. Calcification of the anterior leaflet of the mitral valve. Mediastinum/Lymph Nodes: No pathologically enlarged mediastinal or hilar lymph nodes. Esophagus is unremarkable in appearance. No axillary lymphadenopathy. Lungs/Pleura: Incomplete visualization of the lung apices. Within the visualized portions of the lungs there are no suspicious appearing pulmonary nodules or masses, there is no acute consolidative airspace disease, no pleural effusions and no pneumothorax. Areas of mild scarring are noted in the anterior aspect of the left upper lobe. Musculoskeletal/Soft Tissues: Median sternotomy wires. There are no aggressive appearing lytic or blastic lesions noted in the visualized portions of the skeleton. CTA ABDOMEN AND PELVIS FINDINGS Hepatobiliary: 1.7 cm low-attenuation lesion in segment 7 of the liver with peripheral nodular enhancement, incompletely characterized, but likely a cavernous hemangioma. Sub cm low-attenuation lesion in segment 8 too small to characterize, but likely a cyst. No intra or extrahepatic biliary ductal dilatation. Gallbladder is normal in appearance. Pancreas: No pancreatic mass. No pancreatic ductal dilatation. No pancreatic or peripancreatic fluid or inflammatory changes. Spleen: Unremarkable. Adrenals/Urinary Tract: 2.1 cm simple cyst extending off the posterior aspect of the interpolar  region of the left kidney. Other sub cm low-attenuation lesions in both kidneys are too small to definitively characterize, but are favored to represent tiny cysts. There is also a 1.4 cm high attenuation (56 HU) lesion extending exophytically from the medial aspect of the upper pole of the right kidney (image 90 of series 4). Additionally,  there are 2 intermediate attenuation lesions in the lower poles of the kidneys bilaterally measuring 1.7 cm on the right (image 113 of series 4) and 1.8 cm on the left (image 96 of series 4). Bilateral adrenal glands are normal in appearance. No hydroureteronephrosis. Urinary bladder is unremarkable in appearance. Stomach/Bowel: Normal appearance of the stomach. No pathologic dilatation of small bowel or colon. Several scattered colonic diverticulae are noted, without surrounding inflammatory changes to suggest an acute diverticulitis at this time. The appendix is not confidently identified and may be surgically absent. Regardless, there are no inflammatory changes noted adjacent to the cecum to suggest the presence of an acute appendicitis at this time. Vascular/Lymphatic: Aortic atherosclerosis, without evidence of aneurysm or dissection in the abdominal or pelvic vasculature. Vascular findings and measurements pertinent to potential TAVR procedure, as detailed below. Celiac axis, superior mesenteric artery and inferior mesenteric artery and their major branches are all widely patent, although there appears to be mild stenosis in the proximal superior mesenteric artery (less than 50% diameter stenosis). Single renal arteries bilaterally, both widely patent. No lymphadenopathy noted in the abdomen or pelvis. Reproductive: Prostate gland and seminal vesicles are unremarkable in appearance. Other: Epigastric ventral hernia containing only omental fat. No associated bowel incarceration or obstruction at this time. No significant volume of ascites. No pneumoperitoneum. Musculoskeletal: There are no aggressive appearing lytic or blastic lesions noted in the visualized portions of the skeleton. VASCULAR MEASUREMENTS PERTINENT TO TAVR: AORTA: Minimal Aortic Diameter -  16 x 15 mm Severity of Aortic Calcification -  moderate to severe RIGHT PELVIS: Right Common Iliac Artery - Minimal Diameter - 9.9 x 7.3 mm Tortuosity -  mild Calcification - moderate Right External Iliac Artery - Minimal Diameter - 8.9 x 4.8 mm Tortuosity - mild Calcification - mild Right Common Femoral Artery - Minimal Diameter - 7.5 x 6.2 mm Tortuosity - mild Calcification - moderate LEFT PELVIS: Left Common Iliac Artery - Minimal Diameter - 9.9 x 7.8 mm Tortuosity - mild Calcification - mild Left External Iliac Artery - Minimal Diameter - 8.7 x 8.5 mm Tortuosity - mild Calcification - mild Left Common Femoral Artery - Minimal Diameter - 9.3 x 9.6 mm Tortuosity - mild Calcification - mild Review of the MIP images confirms the above findings. IMPRESSION: 1. Vascular findings and measurements pertinent to potential TAVR procedure, as detailed above. This patient appears to have suitable pelvic arterial access bilaterally. 2. Thickening calcification of the aortic valve, compatible with the reported clinical history of severe aortic stenosis. 3. Cardiomegaly with left ventricular dilatation. 4. Aortic atherosclerosis, in addition to left main and 3 vessel coronary artery disease.Status post median sternotomy for CABG, including LIMA to the LAD. 5. Several indeterminate lesions in the kidneys bilaterally, favored to represent proteinaceous cysts. The lesion in the medial aspect of the upper pole of the right kidney and in the lower pole the right kidney both appear relatively stable in size compared to prior study 11/19/2011, making the likelihood of aggressive lesions very low. By comparison, the lesion in the medial aspect of the lower pole of the left kidney is new compared to the prior examination and therefore more  concerning. These are considered Bosniak class 99F lesions, and warrant follow-up evaluation with MRI of the abdomen with and without IV gadolinium in 6 months to ensure the stability of these lesions and to provide better characterization. This recommendation follows ACR consensus guidelines: Management of the Incidental Renal Mass on CT: A White Paper  of the ACR Incidental Findings Committee. J Am Coll Radiol 2017; article in press. 6. Additional incidental findings, as above. Electronically Signed   By: Trudie Reedaniel  Entrikin M.D.   On: 04/15/2016 17:03    Assessment & Plan    CHB:  Resolved.  I discontinued the pacemaker as it was not capturing.  I will turn the dopamine off.    CAD:  No evidence of ischemia.  Continue current therapy.    CHRONIC SYSTOLIC HF:  Euvolemic.  No further work up.    Signed, Rollene RotundaJames Nydia Ytuarte, MD  05/12/2016, 11:58 AM

## 2016-05-13 ENCOUNTER — Encounter (HOSPITAL_COMMUNITY): Payer: Self-pay | Admitting: Cardiology

## 2016-05-13 ENCOUNTER — Telehealth: Payer: Self-pay | Admitting: Cardiovascular Disease

## 2016-05-13 LAB — GLUCOSE, CAPILLARY
GLUCOSE-CAPILLARY: 163 mg/dL — AB (ref 65–99)
GLUCOSE-CAPILLARY: 226 mg/dL — AB (ref 65–99)
Glucose-Capillary: 142 mg/dL — ABNORMAL HIGH (ref 65–99)
Glucose-Capillary: 165 mg/dL — ABNORMAL HIGH (ref 65–99)

## 2016-05-13 LAB — BASIC METABOLIC PANEL
ANION GAP: 9 (ref 5–15)
BUN: 9 mg/dL (ref 6–20)
CHLORIDE: 102 mmol/L (ref 101–111)
CO2: 25 mmol/L (ref 22–32)
Calcium: 9.1 mg/dL (ref 8.9–10.3)
Creatinine, Ser: 0.76 mg/dL (ref 0.61–1.24)
GFR calc non Af Amer: >60 mL/min (ref >60)
Glucose, Bld: 146 mg/dL — ABNORMAL HIGH (ref 65–99)
POTASSIUM: 4.3 mmol/L (ref 3.5–5.1)
SODIUM: 136 mmol/L (ref 135–145)

## 2016-05-13 MED ORDER — LISINOPRIL 5 MG PO TABS
5.0000 mg | ORAL_TABLET | Freq: Every day | ORAL | Status: DC
  Administered 2016-05-13 – 2016-05-14 (×2): 5 mg via ORAL
  Filled 2016-05-13 (×2): qty 1

## 2016-05-13 NOTE — Progress Notes (Signed)
SUBJECTIVE: The patient is doing well today, though feels weak and not certain he can get around at home yet again.  At this time, he denies chest pain, shortness of breath, or any other new concerns.  Melvin [name]. aspirin  81 mg Oral QPM  . atorvastatin  40 mg Oral q1800  . clopidogrel  75 mg Oral Daily  . fluticasone  2 spray Each Nare Daily  . heparin  5,000 Units Subcutaneous Q8H  . insulin aspart  0-15 Units Subcutaneous TID WC  . insulin aspart  0-5 Units Subcutaneous QHS  . lisinopril  5 mg Oral Daily  . sodium chloride flush  10-40 mL Intracatheter Q12H   . sodium chloride Stopped (05/12/16 1200)    OBJECTIVE: Physical Exam: Vitals:   05/12/16 1755 05/12/16 2100 05/13/16 0500 05/13/16 0700  BP: (!) 158/71 (!) 174/87 (!) 179/72   Pulse:  77 78   Resp:  (!) 21 20   Temp:  98.2 F (36.8 C) 98.5 F (36.9 C) 98.6 F (37 C)  TempSrc:    Oral  SpO2:  97% 96%   Weight:   167 lb (75.8 kg)   Height:        Intake/Output Summary (Last 24 hours) at 05/13/16 1103 Last data filed at 05/13/16 1015  Gross per 24 hour  Intake            554.1 ml  Output             2225 ml  Net          -1670.9 ml    Telemetry reveals SR, 80's, PVC's, occ NSVT 3 beats, these are less frequent then previously  GEN- The patient is well appearing, alert and oriented x 3 today.   Head- normocephalic, atraumatic Eyes-  Sclera clear, conjunctiva pink Ears- hearing intact Oropharynx- clear Neck- supple, no JVP Lungs- Clear to ausculation bilaterally, normal work of breathing Heart- Regular rate and rhythm, 2/6 SM, no rubs or gallops GI- soft, NT, ND Extremities- no clubbing, cyanosis, or edema Skin- no rash or lesion Psych- euthymic mood, full affect Neuro- no gross deficits appreciated  LABS: Basic Metabolic Panel:  Recent Labs  09/81/1909/10/22 0440 05/11/16 0930 05/13/16 0435  NA  --  131* 136  K  --  4.5 4.3  CL  --  96* 102  CO2  --  27 25  GLUCOSE  --  144* 146*  BUN  --  26* 9    CREATININE 1.12 1.02 0.76  CALCIUM  --  9.0 9.1  MG 2.0  --   --    CBC:  Recent Labs  05/11/16 0140 05/11/16 0440  WBC 10.5 11.4*  NEUTROABS 7.7  --   HGB 11.8* 10.8*  HCT 34.6* 32.4*  MCV 91.8 91.0  PLT 192 170   Cardiac Enzymes:  Recent Labs  05/11/16 0440  TROPONINI 0.06*     ASSESSMENT AND PLAN:   1. Syncope     CHB has resolved off BB, temp wore removed and dopamine off yesterday Dr. Ladona Ridgelaylor spoke at length today with the patient given baseline conduction disease, PVCs implanting loop but the patient would prefer watchful waiting at this time.  We will get PT to see him give he has been back in bed a couple days and has had some functional decline.   2. CAD     No CP  3. VHD s/p TAVR 04/30/16     Stable  4. HTN  Will start an ACE today for BP control  Francis DowseRenee Ursuy, PA-C 05/13/2016 11:03 AM  EP Attending  Patient seen and examined. Agree with the findings as noted above. He has had return of his AV conduction. No indication for PPM at present. I suspect he will need one in the future. I offered him an ILR but he declined. He c/o being weak. He will be seen by PT service for rec's. As BP high, will add an ACE inhibitor. He has LV dysfunction.   Leonia ReevesGregg Mirna Sutcliffe,M.D.

## 2016-05-13 NOTE — Telephone Encounter (Signed)
Spoke w dauhgter. Notes concern for patient being recently hospitalized and the anticipation that he was going to be admitted for several days. Now finding out that cardiologist has recommended he be discharged tomorrow. She's aggrieved by this, because patient had 2 hospitalizations back to back and she wants to be absolutely sure that he is OK to be discharged. She is aware that he may be sent to a SNF post-hospital. States if this is needed, she is fine with this but has apprehension because he won't have direct access to his cardiologist.  She wants to make sure Dr. Tresa EndoKelly has been aware of recent hospitalizations and care plan for the patient. She is aware I will route msg to him to review. She voiced thanks for call.

## 2016-05-13 NOTE — Telephone Encounter (Signed)
New message ° °Pt daughter call requesting to speak with RN. Pt daughter did not want to disclose any further information. Please call back to discuss  °

## 2016-05-13 NOTE — Evaluation (Signed)
Physical Therapy Evaluation Patient Details Name: Melvin [name] MRN: [id number] DOB: 1928/04/21 Today's Date: 05/13/2016   History of Present Illness  Patient is Melvin 82 y/o male with hx of TAVR, HTN, HLD, DM, PVD, CAD, CABG, macular degeneration presents s/p syncopal episode at home. Found to have complete heart block. Temp pacemaker placed and then removed.   Clinical Impression  Patient presents with generalized weakness, deconditioning, and impaired balance and mobility s/p above. Tolerated gait training but required use of RW and Min Melvin for balance/safety. Pt with dyspnea on exertion during activity requiring 2 standing rest breaks. Fatigues quickly. Pt independent PTA. Not safe to return home at this time as pt high fall risk. Will follow acutely to maximize independence and mobility prior to return home. Will follow.     Follow Up Recommendations SNF    Equipment Recommendations  None recommended by PT    Recommendations for Other Services       Precautions / Restrictions Precautions Precautions: Fall Restrictions Weight Bearing Restrictions: No      Mobility  Bed Mobility Overal bed mobility: Needs Assistance Bed Mobility: Supine to Sit;Sit to Supine     Supine to sit: Min guard;HOB elevated Sit to supine: Min guard   General bed mobility comments: Increased time to get to EOB with increased effort.   Transfers Overall transfer level: Needs assistance Equipment used: None Transfers: Sit to/from Stand Sit to Stand: Min guard         General transfer comment: Min guard to steady in standing.   Ambulation/Gait Ambulation/Gait assistance: Min assist Ambulation Distance (Feet): 100 Feet Assistive device: Rolling walker (2 wheeled) Gait Pattern/deviations: Step-through pattern;Decreased stride length;Trunk flexed;Wide base of support Gait velocity: decreased   General Gait Details: Slow, shuffling gait, very guarded; Min Melvin for balance/safety. DOE. HR ranged  from 80-94 bpm. 2 standing rest breaks.  Stairs            Wheelchair Mobility    Modified Rankin (Stroke Patients Only)       Balance Overall balance assessment: Needs assistance Sitting-balance support: Feet supported;No upper extremity supported Sitting balance-Leahy Scale: Fair Sitting balance - Comments: Assist to donn shoes sitting EOB. Postural control: Posterior lean Standing balance support: During functional activity Standing balance-Leahy Scale: Poor Standing balance comment: Reliant on UEs for support in standing.                             Pertinent Vitals/Pain Pain Assessment: No/denies pain    Home Living Family/patient expects to be discharged to:: Skilled nursing facility Living Arrangements: Spouse/significant other Available Help at Discharge: Family;Available 24 hours/day Type of Home: House Home Access: Stairs to enter Entrance Stairs-Rails: Left Entrance Stairs-Number of Steps: 14 Home Layout: Two level (split foyer) Home Equipment: Cane - single point;Shower seat      Prior Function Level of Independence: Independent;Independent with assistive device(s)         Comments: Used SPC PRN PTA. Recently d/ced 1 week ago after TAVR.     Hand Dominance        Extremity/Trunk Assessment   Upper Extremity Assessment: Defer to OT evaluation           Lower Extremity Assessment: Generalized weakness      Cervical / Trunk Assessment: Kyphotic  Communication   Communication: HOH  Cognition Arousal/Alertness: Awake/alert Behavior During Therapy: WFL for tasks assessed/performed Overall Cognitive Status: Within Functional Limits for tasks assessed  General Comments General comments (skin integrity, edema, etc.): Wife and son in law present during session.    Exercises     Assessment/Plan    PT Assessment Patient needs continued PT services  PT Problem List Decreased strength;Decreased  mobility;Decreased activity tolerance;Decreased balance;Cardiopulmonary status limiting activity          PT Treatment Interventions DME instruction;Gait training;Therapeutic exercise;Patient/family education;Therapeutic activities;Balance training;Functional mobility training;Stair training    PT Goals (Current goals can be found in the Care Plan section)  Acute Rehab PT Goals Patient Stated Goal: to get stronger and return to PLOF PT Goal Formulation: With patient Time For Goal Achievement: 05/27/16 Potential to Achieve Goals: Good    Frequency Min 2X/week   Barriers to discharge Decreased caregiver support;Inaccessible home environment 14 steps to enter home; wife cannot assist.    Co-evaluation               End of Session Equipment Utilized During Treatment: Gait belt Activity Tolerance: Patient limited by fatigue Patient left: in bed;with call bell/phone within reach;with family/visitor present;with bed alarm set Nurse Communication: Mobility status         Time: 1343-1413 PT Time Calculation (min) (ACUTE ONLY): 30 min   Charges:   PT Evaluation $PT Eval Moderate Complexity: 1 Procedure PT Treatments $Gait Training: 8-22 mins   PT G Codes:        Melvin [name] Melvin [name] 05/13/2016, 2:21 PM  Melvin [name], PT, DPT [phone]

## 2016-05-13 NOTE — Consult Note (Signed)
   [facility] CM Inpatient Consult   05/13/2016  [place] 1928-02-15 [id number]  Patient screened for potential [facility] services for HF. Patient is eligible for [facility] Care Management services under patient's Medicare plan.  Chart review reveals the patient is an 82 YO male with HTN, HLD, DM, CAD s/p CABG and prior PCI with ICM, PAD, aortic stenosis s/p recent TAVR with some intermittent post op afib.  History of LBBB with PVCs. PT is recommending a skilled nursing facility stay post hospital.  Met with patient and wife regarding [facility] benefits for community follow up. Endorses Dr. Cleotis Nipper his primary care provider.  Denies any difficulty with medications or transportation.  Information provided with a brochure, magnet with the 24 hour nurse advise line and contact information.  No current community needs identified.  For questions contact:   Natividad Brood, RN BSN [facility] Liaison  [phone] business mobile phone Toll free office [phone]

## 2016-05-14 ENCOUNTER — Other Ambulatory Visit: Payer: Self-pay | Admitting: Physician Assistant

## 2016-05-14 ENCOUNTER — Inpatient Hospital Stay
Admission: RE | Admit: 2016-05-14 | Discharge: 2016-05-16 | Disposition: A | Discharge status: Other Acute Inpatient Hospital | Payer: Medicare Other | Source: Ambulatory Visit | Attending: Internal Medicine | Admitting: Internal Medicine

## 2016-05-14 ENCOUNTER — Ambulatory Visit: Payer: Medicare Other | Admitting: Cardiovascular Disease

## 2016-05-14 LAB — GLUCOSE, CAPILLARY
Glucose-Capillary: 154 mg/dL — ABNORMAL HIGH (ref 65–99)
Glucose-Capillary: 173 mg/dL — ABNORMAL HIGH (ref 65–99)

## 2016-05-14 MED ORDER — ZOLEDRONIC ACID 5 MG/100ML IV SOLN: 5.0000 mg | Freq: Once | INTRAVENOUS | Status: AC

## 2016-05-14 MED ORDER — LISINOPRIL 10 MG PO TABS: 10.0000 mg | ORAL_TABLET | Freq: Every day | ORAL | 3 refills | Status: AC

## 2016-05-14 NOTE — Clinical Social Work Note (Signed)
Clinical Social Worker facilitated patient discharge including contacting patient family and facility to confirm patient discharge plans.  Clinical information faxed to facility and family agreeable with plan.  CSW arranged ambulance transport via [facility] to [facility].  RN to call report prior to discharge.  Clinical Social Worker will sign off for now as social work intervention is no longer needed. Please consult us again if new need arises.  Macario GoldsJesse Jamayah Myszka, [place] [phone]

## 2016-05-14 NOTE — Clinical Social Work Placement (Signed)
   CLINICAL SOCIAL WORK PLACEMENT  NOTE  Date:  05/14/2016  Patient Details  Name: Melvin [name] MRN: [id number] Date of Birth: [birth year]  Clinical Social Work is seeking post-discharge placement for this patient at the Skilled  Nursing Facility level of care (*CSW will initial, date and re-position this form in  chart as items are completed):  Yes   Patient/family provided with [facility] Clinical Social Work Department's list of facilities offering this level of care within the geographic area requested by the patient (or if unable, by the patient's family).  Yes   Patient/family informed of their freedom to choose among providers that offer the needed level of care, that participate in Medicare, Medicaid or managed care program needed by the patient, have an available bed and are willing to accept the patient.  Yes   Patient/family informed of [facility]'s ownership interest in [facility] and [facility], as well as of the fact that they are under no obligation to receive care at these facilities.  PASRR submitted to EDS on 05/14/16     PASRR number received on 05/14/16     Existing PASRR number confirmed on       FL2 transmitted to all facilities in geographic area requested by pt/family on 05/14/16     FL2 transmitted to all facilities within larger geographic area on       Patient informed that his/her managed care company has contracts with or will negotiate with certain facilities, including the following:            Patient/family informed of bed offers received.  Patient chooses bed at       Physician recommends and patient chooses bed at      Patient to be transferred to   on  .  Patient to be transferred to facility by       Patient family notified on   of transfer.  Name of family member notified:        PHYSICIAN Please prepare priority discharge summary, including medications, Please prepare prescriptions, Please sign FL2     Additional  Comment:    _______________________________________________ Venita Lickampbell, Patrcia Schnepp B, LCSW 05/14/2016, 12:51 AM

## 2016-05-14 NOTE — NC FL2 (Signed)
[place] MEDICAID FL2 LEVEL OF CARE SCREENING TOOL     IDENTIFICATION  Patient Name: Melvin [name] Birthdate: [birth year] Sex: male Admission Date (Current Location): 05/11/2016  [facility] [place] [place]   Facility and Address:  [facility], [address], [place], [place] [address]      Provider Number: [id number]  Attending Physician Name and Address:  Greig CastillaGregory Thomas Means, MD  Relative Name and Phone Number:       Current Level of Care: Hospital Recommended Level of Care: Skilled Nursing Facility Prior Approval Number:    Date Approved/Denied:   PASRR Number: [id number] [id number]  Discharge Plan: SNF    Current Diagnoses: Patient Active Problem List   Diagnosis Date Noted  . Complete heart block (HCC) 05/11/2016  . S/P TAVR (transcatheter aortic valve replacement) 05/11/2016  . Severe aortic valve stenosis 04/30/2016  . Palpitations 06/17/2015  . Skin lesion 02/11/2015  . Unstable angina pectoris (HCC) 11/03/2014  . CAD in native artery 09/28/2014  . SOB (shortness of breath) 04/26/2014  . Tachycardia 04/26/2014  . Non-insulin dependent type 2 diabetes mellitus (HCC) 07/02/2013  . Osteoporosis, unspecified 06/24/2013  . Edema, lower extremity 06/22/2013  . LBBB (left bundle branch block) 12/01/2012  . Heart palpitations 12/01/2012  . Aortic stenosis 12/01/2012  . Unspecified hereditary and idiopathic peripheral neuropathy 10/01/2012  . RLS (restless legs syndrome) 10/01/2012  . Hypertension 11/26/2011  . High cholesterol 11/26/2011  . Liver lesion 11/26/2011    Orientation RESPIRATION BLADDER Height & Weight     Self, Time, Situation, Place  Normal Continent Weight: 75.8 kg (167 lb) Height:  5\' 6"  (167.6 cm)  BEHAVIORAL SYMPTOMS/MOOD NEUROLOGICAL BOWEL NUTRITION STATUS   (NONE)  (NONE) Continent Diet (Heart Healthy Carb Modified)  AMBULATORY STATUS COMMUNICATION OF NEEDS Skin   Limited Assist Verbally Surgical  wounds, Skin abrasions (Incision groin right)                       Personal Care Assistance Level of Assistance  Bathing, Dressing, Feeding Bathing Assistance: Limited assistance Feeding assistance: Independent Dressing Assistance: Limited assistance     Functional Limitations Info  Sight, Hearing, Speech Sight Info: Adequate Hearing Info: Adequate Speech Info: Adequate    SPECIAL CARE FACTORS FREQUENCY  PT (By licensed PT), OT (By licensed OT)     PT Frequency: 5/week OT Frequency: 5/week            Contractures Contractures Info: Not present    Additional Factors Info  Code Status, Allergies, Insulin Sliding Scale Code Status Info: Full Code Allergies Info: Pramipexole   Insulin Sliding Scale Info: 3/day       Current Medications (05/14/2016):  This is the current hospital active medication list Current Facility-Administered Medications  Medication Dose Route Frequency Provider Last Rate Last Dose  . 0.9 %  sodium chloride infusion   Intravenous Continuous Marinus MawGregg W Taylor, MD   Stopped at 05/12/16 1200  . acetaminophen (TYLENOL) tablet 650 mg  650 mg Oral Q6H PRN Rollene RotundaJames Hochrein, MD   650 mg at 05/13/16 1051  . aspirin chewable tablet 81 mg  81 mg Oral QPM Greig CastillaGregory Thomas Means, MD   81 mg at 05/13/16 1811  . atorvastatin (LIPITOR) tablet 40 mg  40 mg Oral q1800 Greig CastillaGregory Thomas Means, MD   40 mg at 05/13/16 1811  . clopidogrel (PLAVIX) tablet 75 mg  75 mg Oral Daily Greig CastillaGregory Thomas Means, MD   75 mg at  05/13/16 [id number]  . fluticasone (FLONASE) 50 MCG/ACT nasal spray 2 spray  2 spray Each Nare Daily Azalee CoursePaul S Corotto, MD   2 spray at 05/13/16 0828  . heparin injection 5,000 Units  5,000 Units Subcutaneous Q8H Greig CastillaGregory Thomas Means, MD   5,000 Units at 05/13/16 2211  . insulin aspart (novoLOG) injection 0-15 Units  0-15 Units Subcutaneous TID WC Greig CastillaGregory Thomas Means, MD   3 Units at 05/13/16 1811  . insulin aspart (novoLOG) injection 0-5 Units  0-5 Units Subcutaneous QHS  Greig CastillaGregory Thomas Means, MD   2 Units at 05/13/16 2212  . lisinopril (PRINIVIL,ZESTRIL) tablet 5 mg  5 mg Oral Daily Renee Norberto SorensonLynn Ursuy, PA-C   5 mg at 05/13/16 1048  . sodium chloride (OCEAN) 0.65 % nasal spray 1 spray  1 spray Each Nare PRN Yates DecampJay Ganji, MD      . sodium chloride flush (NS) 0.9 % injection 10-40 mL  10-40 mL Intracatheter Q12H Marinus MawGregg W Taylor, MD   10 mL at 05/13/16 2200  . sodium chloride flush (NS) 0.9 % injection 10-40 mL  10-40 mL Intracatheter PRN Marinus MawGregg W Taylor, MD         Discharge Medications: Please see discharge summary for a list of discharge medications.  Relevant Imaging Results:  Relevant Lab Results:   Additional Information SSN:   Venita LickCampbell, Tyr Franca B, LCSW

## 2016-05-14 NOTE — Clinical Social Work Placement (Signed)
   CLINICAL SOCIAL WORK PLACEMENT  NOTE  Date:  05/14/2016  Patient Details  Name: Melvin [name] MRN: [id number] Date of Birth: 05-21-1928  Clinical Social Work is seeking post-discharge placement for this patient at the Skilled  Nursing Facility level of care (*CSW will initial, date and re-position this form in  chart as items are completed):  Yes   Patient/family provided with [facility] Clinical Social Work Department's list of facilities offering this level of care within the geographic area requested by the patient (or if unable, by the patient's family).  Yes   Patient/family informed of their freedom to choose among providers that offer the needed level of care, that participate in Medicare, Medicaid or managed care program needed by the patient, have an available bed and are willing to accept the patient.  Yes   Patient/family informed of [facility]'s ownership interest in [facility] and [facility], as well as of the fact that they are under no obligation to receive care at these facilities.  PASRR submitted to EDS on 05/14/16     PASRR number received on 05/14/16     Existing PASRR number confirmed on       FL2 transmitted to all facilities in geographic area requested by pt/family on 05/14/16     FL2 transmitted to all facilities within larger geographic area on       Patient informed that his/her managed care company has contracts with or will negotiate with certain facilities, including the following:        Yes   Patient/family informed of bed offers received.  [facility]     Physician recommends and patient chooses bed at      [facility] on 05/14/16.  Patient to be transferred to facility by Ambulance     Patient family notified on 05/14/16 of transfer.  Name of family member notified:  Patient wife notified over the phone     PHYSICIAN Please prepare priority discharge  summary, including medications, Please prepare prescriptions, Please sign FL2     Additional Comment:    Macario GoldsJesse Nashika Coker, LCSW [phone]

## 2016-05-14 NOTE — Clinical Social Work Note (Signed)
Clinical Social Work Assessment  Patient Details  Name: Melvin [name] MRN: [id number] Date of Birth: 09/18/1927  Date of referral:  05/14/16               Reason for consult:  Discharge Planning, Facility Placement                Permission sought to share information with:  Facility Sport and exercise psychologist, Family Supports Permission granted to share information::  Yes, Verbal Permission Granted  Name::     Judson Roch and Physicist, medical::  SNFs - Prefers [facility]  Relationship::     Contact Information:     Housing/Transportation Living arrangements for the past 2 months:  Single Family Home Source of Information:  Patient, Spouse, Adult Children Patient Interpreter Needed:  None Criminal Activity/Legal Involvement Pertinent to Current Situation/Hospitalization:  No - Comment as needed Significant Relationships:  Adult Children, Spouse Lives with:  Spouse Do you feel safe going back to the place where you live?  Yes Need for family participation in patient care:  No (Coment)  Care giving concerns:  The patient and family are requesting placement as they do not feel the patient's needs can be managed at home at this time.   Social Worker assessment / plan:  CSW met with the patient, patient's wife, and patient's son at bedside to complete assessment. The patient is from [place] and requests SNF placement at [facility] if available. CSW explained SNF search/placement process to the patient and family and their questions were answered. CSW will assist with discharge once patient is medically stable.  Employment status:  Retired Forensic scientist:  [employer] PT Recommendations:  [place] / Referral to community resources:  [place]  Patient/Family's Response to care:  The patient and family are happy with the care the patient is receiving and are appreciative of CSW assistance with placement.  Patient/Family's  Understanding of and Emotional Response to Diagnosis, Current Treatment, and Prognosis:  The patient and family appear to have a good understanding of the patient's diagnosis and reason for admission. The patient wants to complete rehab and return home.   Emotional Assessment Appearance:  Appears stated age Attitude/Demeanor/Rapport:  Other (Patient is appropriate and welcoming of CSW.) Affect (typically observed):  Accepting, Appropriate, Calm, Pleasant Orientation:  Oriented to Self, Oriented to Place, Oriented to  Time, Oriented to Situation Alcohol / Substance use:  Not Applicable Psych involvement (Current and /or in the community):  No (Comment)  Discharge Needs  Concerns to be addressed:  Discharge Planning Concerns Readmission within the last 30 days:  Yes Current discharge risk:  Chronically ill, Physical Impairment Barriers to Discharge:  Continued Medical Work up   Fredderick Phenix B, LCSW 05/14/2016, 12:34 AM

## 2016-05-14 NOTE — Discharge Summary (Signed)
DISCHARGE SUMMARY    Patient ID: Melvin [name],  MRN: [id number], DOB/AGE: Jul 20, 1927 82 y.o.  Admit date: 05/11/2016 Discharge date: 05/14/2016  Primary Care Physician: Colette RibasGOLDING, [name], MD Primary Cardiologist: Drs. Suzzanne CloudKelly and McAlhaney  Primary Discharge Diagnosis:  1. Syncope 2. CHB     Resolved of betablocker 3. Functional decline  Secondary Discharge Diagnosis:  1. 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 CAD      Stable without symptoms 3. DM 4. Chronic Systolic CHF     Compensated 5. HTN   Allergies  Allergen Reactions  . Other     Patient is allergic to something but doesn't know the name.  . Pramipexole Swelling     Procedures This Admission:  1. Temporary pacing wire placement, 05/11/16, Dr. Jacinto HalimGanji Conclusion   A balloontipped temper pacemaker 5 French was introduced into the right atrium and right ventricle. Patient immediately went into asystole and 0.5 mg of epinephrine had to be administered.  There was no immediate complication. Pacemaker capture was obtained at 0.6 MA however pacemaker position is not stable, fortunately patient went into NSR with underlying LBBB @ 65 bpm. I was able to get capture however it was intermittent, left the temporary pacemaker at MA of 20 with a VVI mode at the rate of 50 bpm. I have discussed with Dr. Sharrell KuGreg Taylor regarding unstable position of the temporary pacemaker. I personally sutured the temper pacemaker in place and secured it.    Brief HPI: Melvin [name] is a 82 y.o. male was [facility]16 after a sudden fainting spell, noted to be in CHB requiring transvenous pacing wire insertion, and dopamine was initiated as well, his home metoprolol of 100mg  BID was held.  Hospital Course:  The patient was admitted to the ICU and monitored closely, he has baseline LBBB and thoughts he may need PPM, perhaps CRT-pacer though his AV conduction was regained quickly and had no further bradycardia or AVBlock observed off  his beta blocker.  Dr. Ladona Ridgelaylor for EP was consulted and followed the patient. The dopamine was stopped and the temp wire removed, he remained in SR with PVC's, no bradycardic episodes or further CHB were observed.  Dr. Ladona Ridgelaylor discussed with the patient implant of loop recorder to monitor, though the patient preferred not to.  He did remark though that he felt he had weakened in general since his TAVR procedure though was getting around slowly at home, unfortunately after another couple days in bed has had further functional decline.  PT was called to see the patient and recommended SNF/rehab for him.  The patient is agreeable to this as long as it is in [place].   I offered to call his daughter/family to discuss but he said that would not be necessary and that he was fine with the POC.  He was started on Lisinopril for BP control (given stopping his metoprolol) and to resume his home Norvasc and hydralazine.  The patient was examined by Dr. Ladona Ridgelaylor and considered stable for discharge to SNF.  Will arrange f/u visit in 1 week with cardiology APP for early follow up, BP evaluation, EKG and BMET and will keep his planned follow up for 2 weeks as well for an echo and visit with Dr. Sanjuana KavaMcAlhaney.    The patient was made aware of [place] state law, no driving for 6 months post syncope.   Physical Exam: Vitals:   05/14/16 0017 05/14/16 0500 05/14/16 0855 05/14/16 0900  BP: Marland Kitchen(!)  185/49 (!) 153/56 (!) 184/84 (!) 184/84  Pulse: (!) 40 75    Resp: 17 19    Temp: 99 F (37.2 C) 98.4 F (36.9 C)    TempSrc:      SpO2: 97% 97%    Weight:  166 lb 4.8 oz (75.4 kg)    Height:        GEN- The patient is well appearing, alert and oriented x 3 today.   HEENT: normocephalic, atraumatic; sclera clear, conjunctiva pink; hearing intact; oropharynx clear; neck supple, no JVP, R IJ site stable Lungs- Clear to ausculation bilaterally, normal work of breathing.  No wheezes, rales, rhonchi Heart- Regular rate and rhythm, 2/6 SM,  rubs or gallops, PMI not laterally displaced GI- soft, non-tender, non-distended, bowel sounds present, no hepatosplenomegaly Extremities- no clubbing, cyanosis, or edema MS- no significant deformity or atrophy Skin- warm and dry, no rash or lesion Psych- euthymic mood, full affect Neuro- no gross deficits   Labs:   Lab Results  Component Value Date   WBC 11.4 (H) 05/11/2016   HGB 10.8 (L) 05/11/2016   HCT 32.4 (L) 05/11/2016   MCV 91.0 05/11/2016   PLT 170 05/11/2016     Recent Labs Lab 05/13/16 0435  NA 136  K 4.3  CL 102  CO2 25  BUN 9  CREATININE 0.76  CALCIUM 9.1  GLUCOSE 146*    Discharge Medications:    Medication List    STOP taking these medications   metoprolol 100 MG tablet Commonly known as:  LOPRESSOR     TAKE these medications   amLODipine 10 MG tablet Commonly known as:  NORVASC Take 1 tablet (10 mg total) by mouth daily.   aspirin 81 MG tablet Take 81 mg by mouth every evening.   atorvastatin 40 MG tablet Commonly known as:  LIPITOR TAKE 1 TABLET DAILY AT 6 P.M. (KEEP OFFICE VISIT)   cetirizine 10 MG tablet Commonly known as:  ZYRTEC Take 10 mg by mouth daily as needed for allergies.   clopidogrel 75 MG tablet Commonly known as:  PLAVIX TAKE 1 TABLET DAILY (KEEP OFFICE VISIT)   gabapentin 300 MG capsule Commonly known as:  NEURONTIN Take 1 capsule (300 mg total) by mouth at bedtime.   hydrALAZINE 10 MG tablet Commonly known as:  APRESOLINE Take 2 tablets (20 mg total) by mouth every 8 (eight) hours.   lisinopril 10 MG tablet Commonly known as:  PRINIVIL,ZESTRIL Take 1 tablet (10 mg total) by mouth daily. Start taking on:  05/15/2016   metformin 1000 MG (OSM) 24 hr tablet Commonly known as:  FORTAMET Take 1,000 mg by mouth 2 (two) times daily with a meal.   multivitamin-iron-minerals-folic acid chewable tablet Chew 1 tablet by mouth daily.   PRESERVISION AREDS 2 PO Take 1 capsule by mouth 2 (two) times daily.   niacin  750 MG CR tablet Commonly known as:  NIASPAN Take 2 tablets (1,500 mg total) by mouth at bedtime.   nitroGLYCERIN 0.4 MG SL tablet Commonly known as:  NITROSTAT Place 1 tablet (0.4 mg total) under the tongue every 5 (five) minutes as needed for chest pain.   oxymetazoline 0.05 % nasal spray Commonly known as:  AFRIN Place 2 sprays into both nostrils 3 (three) times daily as needed for congestion (Allergies).   polycarbophil 625 MG tablet Commonly known as:  FIBERCON Take 1,250 mg by mouth daily.   potassium chloride SA 20 MEQ tablet Commonly known as:  K-DUR,KLOR-CON Take 1 tablet (20  mEq total) by mouth daily.   torsemide 20 MG tablet Commonly known as:  DEMADEX Take 2 tablets (40 mg total) by mouth 2 (two) times daily.   zoledronic acid 5 MG/100ML Soln injection Commonly known as:  RECLAST Inject 100 mLs (5 mg total) into the vein once. Next dose due 07-2016 What changed:  additional instructions Notes to patient:  No changes are being made to this medication, to be continued as was prescribed/instructed by the original prescribing physician       Disposition: SNF Discharge Instructions    Diet - low sodium heart healthy    Complete by:  As directed    Increase activity slowly    Complete by:  As directed      Follow-up Information    Tereso Newcomer, PA-C Follow up on 05/21/2016.   Specialties:  Cardiology, Physician Assistant Why:  10:45AM Contact information: [address] [place] [address] [phone]        [facility] Follow up on 05/29/2016.   Specialty:  Cardiology Why:  9:30AM, echocardiogram (ultrasound) Contact information: [address], [address] [place] [address] [phone]       Verne Carrow, MD Follow up on 05/29/2016.   Specialty:  Cardiology Why:  10:30AM Contact information: [address] [place] [address] [phone]            Duration of Discharge Encounter: Greater than 30 minutes including physician time.  SignedFrancis Dowse, PA-C 05/14/2016 10:10 AM  EP Attending  Patient seen and examined. Agree with above. His cardiac conduction has normalized. Note rec's from PT. Ok for DC today. He will go to SNF for strengthening as he is still weak.  Leonia Reeves.D.

## 2016-05-14 NOTE — Discharge Instructions (Signed)
No driving for 6 months after fainting episode

## 2016-05-14 NOTE — Progress Notes (Signed)
Called report to Cabin crewGeorge RN at [facility]. All questions answered.

## 2016-05-14 NOTE — Plan of Care (Signed)
Problem: Safety: Goal: Ability to remain free from injury will improve Outcome: Completed/Met Date Met: 05/14/16 Pt has remained free from injury during this admission   Problem: Physical Regulation: Goal: Will remain free from infection Outcome: Completed/Met Date Met: 05/14/16 Pt has reminded free from infection during this admission   Problem: Fluid Volume: Goal: Ability to maintain a balanced intake and output will improve Outcome: Completed/Met Date Met: 05/14/16 Pt has adequate intake and output   Problem: Bowel/Gastric: Goal: Will not experience complications related to bowel motility Outcome: Completed/Met Date Met: 05/14/16 Pt has not had any complications related to bowel motility during this admission

## 2016-05-14 NOTE — Care Management Note (Signed)
Case Management Note  Patient Details  Name: Melvin [name] MRN: [id number] Date of Birth: 1928-05-11  Subjective/Objective:   Pt presented for syncope with CHB. Pt is from home. Plan will be for d/c to SNF.                  Action/Plan: CSW assisting with disposition needs. No further needs from CM at this time.   Expected Discharge Date:                  Expected Discharge Plan:  Skilled Nursing Facility  In-House Referral:  Clinical Social Work  Discharge planning Services  CM Consult  Post Acute Care Choice:  NA Choice offered to:  NA  DME Arranged:  N/A DME Agency:  NA  HH Arranged:  NA HH Agency:  NA  Status of Service:  Completed, signed off  If discussed at Long Length of Stay Meetings, dates discussed:    Additional Comments:  Gala LewandowskyGraves-Bigelow, Melvin [name] Kaye, RN 05/14/2016, 11:14 AM

## 2016-05-16 ENCOUNTER — Encounter (HOSPITAL_COMMUNITY)
Admission: EM | Disposition: A | Discharge status: Skilled Nursing Facility | Payer: Self-pay | Source: Home / Self Care | Attending: Cardiology

## 2016-05-16 ENCOUNTER — Inpatient Hospital Stay (HOSPITAL_COMMUNITY)
Admission: EM | Admit: 2016-05-16 | Discharge: 2016-05-17 | DRG: 243 | Disposition: A | Discharge status: Skilled Nursing Facility | Payer: Medicare Other | Attending: Cardiology | Admitting: Cardiology

## 2016-05-16 ENCOUNTER — Emergency Department (HOSPITAL_COMMUNITY): Payer: Medicare Other

## 2016-05-16 ENCOUNTER — Encounter (HOSPITAL_COMMUNITY): Payer: Self-pay | Admitting: Emergency Medicine

## 2016-05-16 DIAGNOSIS — K219 Gastro-esophageal reflux disease without esophagitis: Secondary | ICD-10-CM | POA: Diagnosis present

## 2016-05-16 DIAGNOSIS — H3530 Unspecified macular degeneration: Secondary | ICD-10-CM | POA: Diagnosis present

## 2016-05-16 DIAGNOSIS — I110 Hypertensive heart disease with heart failure: Secondary | ICD-10-CM | POA: Diagnosis present

## 2016-05-16 DIAGNOSIS — I5042 Chronic combined systolic (congestive) and diastolic (congestive) heart failure: Secondary | ICD-10-CM | POA: Diagnosis present

## 2016-05-16 DIAGNOSIS — I472 Ventricular tachycardia, unspecified: Secondary | ICD-10-CM

## 2016-05-16 DIAGNOSIS — Z9842 Cataract extraction status, left eye: Secondary | ICD-10-CM

## 2016-05-16 DIAGNOSIS — Z961 Presence of intraocular lens: Secondary | ICD-10-CM | POA: Diagnosis present

## 2016-05-16 DIAGNOSIS — E7800 Pure hypercholesterolemia, unspecified: Secondary | ICD-10-CM | POA: Diagnosis present

## 2016-05-16 DIAGNOSIS — Z952 Presence of prosthetic heart valve: Secondary | ICD-10-CM

## 2016-05-16 DIAGNOSIS — I447 Left bundle-branch block, unspecified: Secondary | ICD-10-CM | POA: Diagnosis present

## 2016-05-16 DIAGNOSIS — Z951 Presence of aortocoronary bypass graft: Secondary | ICD-10-CM | POA: Diagnosis not present

## 2016-05-16 DIAGNOSIS — I495 Sick sinus syndrome: Principal | ICD-10-CM | POA: Diagnosis present

## 2016-05-16 DIAGNOSIS — R079 Chest pain, unspecified: Secondary | ICD-10-CM | POA: Diagnosis present

## 2016-05-16 DIAGNOSIS — E1151 Type 2 diabetes mellitus with diabetic peripheral angiopathy without gangrene: Secondary | ICD-10-CM | POA: Diagnosis present

## 2016-05-16 DIAGNOSIS — G609 Hereditary and idiopathic neuropathy, unspecified: Secondary | ICD-10-CM | POA: Diagnosis present

## 2016-05-16 DIAGNOSIS — J9811 Atelectasis: Secondary | ICD-10-CM | POA: Diagnosis present

## 2016-05-16 DIAGNOSIS — I2510 Atherosclerotic heart disease of native coronary artery without angina pectoris: Secondary | ICD-10-CM | POA: Diagnosis present

## 2016-05-16 DIAGNOSIS — I5022 Chronic systolic (congestive) heart failure: Secondary | ICD-10-CM

## 2016-05-16 DIAGNOSIS — M810 Age-related osteoporosis without current pathological fracture: Secondary | ICD-10-CM | POA: Diagnosis present

## 2016-05-16 DIAGNOSIS — Z7902 Long term (current) use of antithrombotics/antiplatelets: Secondary | ICD-10-CM

## 2016-05-16 DIAGNOSIS — Z7982 Long term (current) use of aspirin: Secondary | ICD-10-CM

## 2016-05-16 DIAGNOSIS — G2581 Restless legs syndrome: Secondary | ICD-10-CM | POA: Diagnosis present

## 2016-05-16 DIAGNOSIS — I442 Atrioventricular block, complete: Secondary | ICD-10-CM | POA: Diagnosis present

## 2016-05-16 DIAGNOSIS — Z950 Presence of cardiac pacemaker: Secondary | ICD-10-CM

## 2016-05-16 DIAGNOSIS — I480 Paroxysmal atrial fibrillation: Secondary | ICD-10-CM | POA: Diagnosis present

## 2016-05-16 DIAGNOSIS — I4819 Other persistent atrial fibrillation: Secondary | ICD-10-CM | POA: Diagnosis present

## 2016-05-16 DIAGNOSIS — Z7984 Long term (current) use of oral hypoglycemic drugs: Secondary | ICD-10-CM

## 2016-05-16 DIAGNOSIS — Z888 Allergy status to other drugs, medicaments and biological substances status: Secondary | ICD-10-CM

## 2016-05-16 DIAGNOSIS — M1990 Unspecified osteoarthritis, unspecified site: Secondary | ICD-10-CM | POA: Diagnosis present

## 2016-05-16 DIAGNOSIS — Z9841 Cataract extraction status, right eye: Secondary | ICD-10-CM | POA: Diagnosis not present

## 2016-05-16 DIAGNOSIS — I4891 Unspecified atrial fibrillation: Secondary | ICD-10-CM | POA: Diagnosis not present

## 2016-05-16 DIAGNOSIS — I255 Ischemic cardiomyopathy: Secondary | ICD-10-CM | POA: Diagnosis present

## 2016-05-16 DIAGNOSIS — Z8249 Family history of ischemic heart disease and other diseases of the circulatory system: Secondary | ICD-10-CM

## 2016-05-16 HISTORY — PX: EP IMPLANTABLE DEVICE: SHX172B

## 2016-05-16 LAB — CBC
HEMATOCRIT: 35 % — AB (ref 39.0–52.0)
HEMOGLOBIN: 11.5 g/dL — AB (ref 13.0–17.0)
MCH: 30.8 pg (ref 26.0–34.0)
MCHC: 32.9 g/dL (ref 30.0–36.0)
MCV: 93.8 fL (ref 78.0–100.0)
Platelets: 215 10*3/uL (ref 150–400)
RBC: 3.73 MIL/uL — AB (ref 4.22–5.81)
RDW: 14.4 % (ref 11.5–15.5)
WBC: 8.7 10*3/uL (ref 4.0–10.5)

## 2016-05-16 LAB — PROTIME-INR
INR: 0.95
Prothrombin Time: 12.7 seconds (ref 11.4–15.2)

## 2016-05-16 LAB — I-STAT TROPONIN, ED: TROPONIN I, POC: 0.09 ng/mL — AB (ref 0.00–0.08)

## 2016-05-16 LAB — BRAIN NATRIURETIC PEPTIDE: B Natriuretic Peptide: 151.5 pg/mL — ABNORMAL HIGH (ref 0.0–100.0)

## 2016-05-16 LAB — BASIC METABOLIC PANEL
ANION GAP: 11 (ref 5–15)
BUN: 29 mg/dL — ABNORMAL HIGH (ref 6–20)
CHLORIDE: 104 mmol/L (ref 101–111)
CO2: 23 mmol/L (ref 22–32)
Calcium: 9.2 mg/dL (ref 8.9–10.3)
Creatinine, Ser: 1.21 mg/dL (ref 0.61–1.24)
GFR calc non Af Amer: 52 mL/min — ABNORMAL LOW (ref >60)
GLUCOSE: 187 mg/dL — AB (ref 65–99)
POTASSIUM: 3.8 mmol/L (ref 3.5–5.1)
Sodium: 138 mmol/L (ref 135–145)

## 2016-05-16 LAB — GLUCOSE, CAPILLARY
Glucose-Capillary: 163 mg/dL — ABNORMAL HIGH (ref 65–99)
Glucose-Capillary: 155 mg/dL — ABNORMAL HIGH (ref 65–99)

## 2016-05-16 LAB — TROPONIN I
TROPONIN I: 0.18 ng/mL — AB (ref <0.03)
Troponin I: 1.13 ng/mL (ref <0.03)
Troponin I: 1.36 ng/mL (ref <0.03)

## 2016-05-16 LAB — TSH: TSH: 1.102 u[IU]/mL (ref 0.350–4.500)

## 2016-05-16 SURGERY — BIV PACEMAKER INSERTION CRT-P

## 2016-05-16 MED ORDER — METOPROLOL TARTRATE 50 MG PO TABS
50.0000 mg | ORAL_TABLET | Freq: Two times a day (BID) | ORAL | Status: DC
  Administered 2016-05-16 – 2016-05-17 (×2): 50 mg via ORAL
  Filled 2016-05-16 (×2): qty 1

## 2016-05-16 MED ORDER — AMIODARONE HCL IN DEXTROSE 360-4.14 MG/200ML-% IV SOLN
60.0000 mg/h | Freq: Once | INTRAVENOUS | Status: AC
  Administered 2016-05-16: 60 mg/h via INTRAVENOUS

## 2016-05-16 MED ORDER — CEFAZOLIN SODIUM-DEXTROSE 2-4 GM/100ML-% IV SOLN
2.0000 g | INTRAVENOUS | Status: AC
  Administered 2016-05-16: 2 g via INTRAVENOUS

## 2016-05-16 MED ORDER — ACETAMINOPHEN 325 MG PO TABS: 325.0000 mg | ORAL_TABLET | Freq: Four times a day (QID) | ORAL | Status: DC | PRN

## 2016-05-16 MED ORDER — MIDAZOLAM HCL 5 MG/5ML IJ SOLN
INTRAMUSCULAR | Status: DC | PRN
  Administered 2016-05-16 (×3): 1 mg via INTRAVENOUS

## 2016-05-16 MED ORDER — CHLORHEXIDINE GLUCONATE 4 % EX LIQD
CUTANEOUS | Status: AC
  Filled 2016-05-16: qty 15

## 2016-05-16 MED ORDER — POTASSIUM CHLORIDE CRYS ER 20 MEQ PO TBCR
20.0000 meq | EXTENDED_RELEASE_TABLET | Freq: Every day | ORAL | Status: DC
  Administered 2016-05-16 – 2016-05-17 (×2): 20 meq via ORAL
  Filled 2016-05-16 (×2): qty 1

## 2016-05-16 MED ORDER — GENTAMICIN SULFATE 40 MG/ML IJ SOLN: 80.0000 mg | INTRAMUSCULAR | Status: DC

## 2016-05-16 MED ORDER — MIDAZOLAM HCL 5 MG/5ML IJ SOLN
INTRAMUSCULAR | Status: AC
  Filled 2016-05-16: qty 5

## 2016-05-16 MED ORDER — INSULIN ASPART 100 UNIT/ML ~~LOC~~ SOLN
0.0000 [IU] | Freq: Every day | SUBCUTANEOUS | Status: DC
Start: 2016-05-16 — End: 2016-05-17
  Administered 2016-05-16: 2 [IU] via SUBCUTANEOUS

## 2016-05-16 MED ORDER — IOPAMIDOL (ISOVUE-370) INJECTION 76%
INTRAVENOUS | Status: AC
  Filled 2016-05-16: qty 50

## 2016-05-16 MED ORDER — SODIUM CHLORIDE 0.9 % IR SOLN
Status: AC
  Filled 2016-05-16: qty 2

## 2016-05-16 MED ORDER — FENTANYL CITRATE (PF) 100 MCG/2ML IJ SOLN
INTRAMUSCULAR | Status: DC | PRN
  Administered 2016-05-16 (×3): 12.5 ug via INTRAVENOUS

## 2016-05-16 MED ORDER — HEPARIN (PORCINE) IN NACL 100-0.45 UNIT/ML-% IJ SOLN
1200.0000 [IU]/h | INTRAMUSCULAR | Status: DC
  Administered 2016-05-16: 1200 [IU]/h via INTRAVENOUS
  Filled 2016-05-16: qty 250

## 2016-05-16 MED ORDER — ZOLEDRONIC ACID 5 MG/100ML IV SOLN: 5.0000 mg | Freq: Once | INTRAVENOUS | Status: DC

## 2016-05-16 MED ORDER — CEFAZOLIN SODIUM-DEXTROSE 2-4 GM/100ML-% IV SOLN
INTRAVENOUS | Status: AC
Start: 2016-05-16 — End: 2016-05-16
  Filled 2016-05-16: qty 100

## 2016-05-16 MED ORDER — FENTANYL CITRATE (PF) 100 MCG/2ML IJ SOLN
INTRAMUSCULAR | Status: AC
  Filled 2016-05-16: qty 2

## 2016-05-16 MED ORDER — GABAPENTIN 300 MG PO CAPS
300.0000 mg | ORAL_CAPSULE | Freq: Every day | ORAL | Status: DC
  Administered 2016-05-16: 300 mg via ORAL
  Filled 2016-05-16: qty 1

## 2016-05-16 MED ORDER — LIDOCAINE HCL (PF) 1 % IJ SOLN
INTRAMUSCULAR | Status: DC | PRN
  Administered 2016-05-16: 40 mL

## 2016-05-16 MED ORDER — LIDOCAINE HCL (PF) 1 % IJ SOLN
INTRAMUSCULAR | Status: AC
  Filled 2016-05-16: qty 60

## 2016-05-16 MED ORDER — AMIODARONE HCL IN DEXTROSE 360-4.14 MG/200ML-% IV SOLN
INTRAVENOUS | Status: AC
  Administered 2016-05-16: 60 mg/h via INTRAVENOUS
  Filled 2016-05-16: qty 200

## 2016-05-16 MED ORDER — ACETAMINOPHEN 325 MG PO TABS: 650.0000 mg | ORAL_TABLET | ORAL | Status: DC | PRN

## 2016-05-16 MED ORDER — AMIODARONE LOAD VIA INFUSION
150.0000 mg | Freq: Once | INTRAVENOUS | Status: AC
  Administered 2016-05-16: 150 mg via INTRAVENOUS
  Filled 2016-05-16: qty 83.34

## 2016-05-16 MED ORDER — ASPIRIN 81 MG PO TABS: 81.0000 mg | ORAL_TABLET | Freq: Every evening | ORAL | Status: DC

## 2016-05-16 MED ORDER — TORSEMIDE 20 MG PO TABS
40.0000 mg | ORAL_TABLET | Freq: Two times a day (BID) | ORAL | Status: DC
  Administered 2016-05-16 – 2016-05-17 (×2): 40 mg via ORAL
  Filled 2016-05-16 (×2): qty 2

## 2016-05-16 MED ORDER — INSULIN ASPART 100 UNIT/ML ~~LOC~~ SOLN
0.0000 [IU] | Freq: Three times a day (TID) | SUBCUTANEOUS | Status: DC
Start: 2016-05-16 — End: 2016-05-17
  Administered 2016-05-16 (×2): 3 [IU] via SUBCUTANEOUS
  Administered 2016-05-17: 2 [IU] via SUBCUTANEOUS

## 2016-05-16 MED ORDER — ETOMIDATE 2 MG/ML IV SOLN
5.0000 mg | Freq: Once | INTRAVENOUS | Status: AC
  Administered 2016-05-16: 5 mg via INTRAVENOUS

## 2016-05-16 MED ORDER — ACETAMINOPHEN 325 MG PO TABS
325.0000 mg | ORAL_TABLET | ORAL | Status: DC | PRN
  Administered 2016-05-16 – 2016-05-17 (×2): 650 mg via ORAL
  Filled 2016-05-16 (×2): qty 2

## 2016-05-16 MED ORDER — HEPARIN (PORCINE) IN NACL 2-0.9 UNIT/ML-% IJ SOLN
INTRAMUSCULAR | Status: AC
  Filled 2016-05-16: qty 500

## 2016-05-16 MED ORDER — CLOPIDOGREL BISULFATE 75 MG PO TABS
75.0000 mg | ORAL_TABLET | Freq: Every day | ORAL | Status: DC
  Administered 2016-05-16 – 2016-05-17 (×2): 75 mg via ORAL
  Filled 2016-05-16 (×2): qty 1

## 2016-05-16 MED ORDER — NITROGLYCERIN 0.4 MG SL SUBL: 0.4000 mg | SUBLINGUAL_TABLET | SUBLINGUAL | Status: DC | PRN

## 2016-05-16 MED ORDER — IOPAMIDOL (ISOVUE-370) INJECTION 76%
INTRAVENOUS | Status: DC | PRN
  Administered 2016-05-16: 50 mL

## 2016-05-16 MED ORDER — AMLODIPINE BESYLATE 10 MG PO TABS
10.0000 mg | ORAL_TABLET | Freq: Every day | ORAL | Status: DC
  Administered 2016-05-16 – 2016-05-17 (×2): 10 mg via ORAL
  Filled 2016-05-16 (×2): qty 1

## 2016-05-16 MED ORDER — LISINOPRIL 10 MG PO TABS
10.0000 mg | ORAL_TABLET | Freq: Every day | ORAL | Status: DC
  Administered 2016-05-16 – 2016-05-17 (×2): 10 mg via ORAL
  Filled 2016-05-16 (×2): qty 1

## 2016-05-16 MED ORDER — ATORVASTATIN CALCIUM 40 MG PO TABS
40.0000 mg | ORAL_TABLET | Freq: Every day | ORAL | Status: DC
  Administered 2016-05-16: 40 mg via ORAL
  Filled 2016-05-16: qty 1

## 2016-05-16 MED ORDER — ONDANSETRON HCL 4 MG/2ML IJ SOLN: 4.0000 mg | Freq: Four times a day (QID) | INTRAMUSCULAR | Status: DC | PRN

## 2016-05-16 MED ORDER — ASPIRIN EC 81 MG PO TBEC
81.0000 mg | DELAYED_RELEASE_TABLET | Freq: Every day | ORAL | Status: DC
  Administered 2016-05-17: 81 mg via ORAL
  Filled 2016-05-16 (×2): qty 1

## 2016-05-16 MED ORDER — CHLORHEXIDINE GLUCONATE 4 % EX LIQD
60.0000 mL | Freq: Once | CUTANEOUS | Status: AC
  Administered 2016-05-16: 4 via TOPICAL

## 2016-05-16 MED ORDER — HEPARIN BOLUS VIA INFUSION
4000.0000 [IU] | Freq: Once | INTRAVENOUS | Status: AC
  Administered 2016-05-16: 4000 [IU] via INTRAVENOUS
  Filled 2016-05-16: qty 4000

## 2016-05-16 MED ORDER — CHLORHEXIDINE GLUCONATE 4 % EX LIQD: 60.0000 mL | Freq: Once | CUTANEOUS | Status: AC

## 2016-05-16 MED ORDER — AMIODARONE HCL 200 MG PO TABS
200.0000 mg | ORAL_TABLET | Freq: Every day | ORAL | Status: DC
  Administered 2016-05-16 – 2016-05-17 (×2): 200 mg via ORAL
  Filled 2016-05-16 (×2): qty 1

## 2016-05-16 MED ORDER — CEFAZOLIN IN D5W 1 GM/50ML IV SOLN
1.0000 g | Freq: Four times a day (QID) | INTRAVENOUS | Status: AC
  Administered 2016-05-16 – 2016-05-17 (×3): 1 g via INTRAVENOUS
  Filled 2016-05-16 (×3): qty 50

## 2016-05-16 MED ORDER — AMIODARONE HCL IN DEXTROSE 360-4.14 MG/200ML-% IV SOLN
30.0000 mg/h | INTRAVENOUS | Status: DC
  Administered 2016-05-16: 30 mg/h via INTRAVENOUS

## 2016-05-16 SURGICAL SUPPLY — 17 items
ADAPTER SEALING SSA-EW-09 (MISCELLANEOUS) ×2 IMPLANT
ADPR INTRO LNG 9FR SL XTD WNG (MISCELLANEOUS) ×1
CABLE SURGICAL S-101-97-12 (CABLE) ×2 IMPLANT
CATH ATTAIN COM SURV 6250V-MB2 (CATHETERS) ×2 IMPLANT
CATH ATTAIN SELECT 6238TEL (CATHETERS) ×2 IMPLANT
CATH HEX JOS 2-5-2 65CM 6F REP (CATHETERS) ×2 IMPLANT
LEAD ATTAIN ABILITY 4396-88CM (Lead) ×2 IMPLANT
LEAD CAPSURE NOVUS 45CM (Lead) ×2 IMPLANT
LEAD CAPSURE NOVUS 5076-52CM (Lead) ×2 IMPLANT
PACEMAKER PRCT MRI CRTP W1TR01 (Pacemaker) IMPLANT
PAD DEFIB LIFELINK (PAD) ×2 IMPLANT
PPM PRECEPTA MRI CRT-P W1TR01 (Pacemaker) ×3 IMPLANT
SHEATH CLASSIC 7F (SHEATH) ×4 IMPLANT
SHEATH CLASSIC 9F (SHEATH) ×2 IMPLANT
SLITTER 6232ADJ (MISCELLANEOUS) ×2 IMPLANT
TRAY PACEMAKER INSERTION (PACKS) ×2 IMPLANT
WIRE LUGE 182CM (WIRE) ×2 IMPLANT

## 2016-05-16 NOTE — Care Management Note (Signed)
Case Management Note  Patient Details  Name: Melvin [name] MRN: [id number] Date of Birth: 1927/07/23  Subjective/Objective:         Adm w tachy brady syndrome          Action/Plan: for poss pacer, was at snf pta   Expected Discharge Date:                  Expected Discharge Plan:  Skilled Nursing Facility  In-House Referral:  Clinical Social Work  Discharge planning Services     Post Acute Care Choice:    Choice offered to:     DME Arranged:    DME Agency:     HH Arranged:    HH Agency:     Status of Service:  In process, will continue to follow  If discussed at Long Length of Stay Meetings, dates discussed:    Additional Comments:sw ref placed, has wife Waldon ReiningDowell, Marthena Whitmyer T, RN 05/16/2016, 9:21 AM

## 2016-05-16 NOTE — Discharge Summary (Signed)
ELECTROPHYSIOLOGY PROCEDURE DISCHARGE SUMMARY    Patient ID: Melvin [name],  MRN: [id number], DOB/AGE: 1928-04-07 82 y.o.  Admit date: 05/16/2016 Discharge date: 05/17/16  Primary Care Physician: Colette Ribas, MD Primary Cardiologist: Dr. Tresa Endo and Sanjuana Kava Electrophysiologist: Dr. Ladona Ridgel (new)  Primary Discharge Diagnosis:  1. Tachy-brady syndrome 2. PAFib  Secondary Discharge Diagnosis:  1. 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 CAD      Stable without symptoms      Remote CABG, last PCI April 2016 3. DM 4. Chronic Systolic CHF     Compensated 5. HTN  Allergies  Allergen Reactions  . Other     Patient is allergic to something but doesn't know the name.  . Pramipexole Swelling     Procedures This Admission:  1.  Implantation of a CRT- PPM on 05/16/16 by Dr Ladona Ridgel.  The patient received a Medtronic (serial number [id number]) pacemaker Medtronic model [id number] (serial number [id number]) right atrial lead and a Medtronic model [id number] (serial number [id number]) right ventricular lead, Medtronic(serial number [id number]) LV lead There were no immediate post procedure complications. 2.  CXR on 05/17/16 demonstrated no pneumothorax status post device implantation.   Brief HPI: Melvin [name] is a 82 y.o. male was admitted 05/15/16 secondary to angina and RAFib, he was woken with L arm pain, no CP, no SOB, he was found tachycardic and ES called, in the ED he was noted to be in WCT suspected to be RAFib, given low BP was cardioverted (requiring 2 shocks), and started on amiodarone gtt, admitted to ICU for further care and management.  Hospital Course:  The patient's PMHx of CAD remote CABG, VHD s/p TAVR last month with post-procedure PAF, HTN, HLD, DM, chronic CHF/mixed CM, most recently just discharged from here 05/14/16, admitted at that time with syncope secondary to CHB/bradycardia, his rate controlling meds held and his conduction recovered and discharged to SNF for rehab  secondary to functional decline.  Unfortunately returned with RAF as noted in HPI, flet to be tachy-brady, and given CM, underwent CRT-Pacemaker implant 05/16/16 with dr. Ladona Ridgel with details noted above. He was monitored on telemetry overnight which demonstrated SR/V pacing, frequent PVCs, rare brief NSVT.  Left chest was without hematoma or ecchymosis.  The device was interrogated and found to be functioning normally.  CXR was obtained and demonstrated no pneumothorax status post device implantation.  Wound care, arm mobility, and restrictions were reviewed with the patient.  The patient was examined by Dr. Ladona Ridgel and considered stable for discharge back to the SNF for continued therapy/rehab.  In discussion with Dr. Sanjuana Kava given his PAFib, needs a/c.  Will start Eliquis on Monday 05/20/16, and discontinue the plavix.  The patient is instructed no driving until cleared to by Dr. Ladona Ridgel at his his wound check visit.  We have resumed his metoprolol and added amiodarone.  SNF instructions: START Eliquis 5mg  PO BID on Monday 05/20/16 and discontinue Plavix (last dose 05/1216)    Supplemental Discharge Instructions for  Pacemaker/Defibrillator Patients  Activity No heavy lifting or vigorous activity with your left/right arm for 6 to 8 weeks.  Do not raise your left/right arm above your head for one week.  Gradually raise your affected arm as drawn below.             05/20/16                   05/21/16  05/22/16                 05/23/16 __  NO DRIVING until cleared to at your wound check visit  WOUND CARE - Keep the wound area clean and dry.  Do not get this area wet for one week. No showers for one week; you may shower on  05/23/16   . - The tape/steri-strips on your wound will fall off; do not pull them off.  No bandage is needed on the site.  DO  NOT apply any creams, oils, or ointments to the wound area. - If you notice any drainage or discharge from the wound, any swelling or  bruising at the site, or you develop a fever > 101? F after you are discharged home, call the office at once.  Special Instructions - You are still able to use cellular telephones; use the ear opposite the side where you have your pacemaker/defibrillator.  Avoid carrying your cellular phone near your device. - When traveling through airports, show security personnel your identification card to avoid being screened in the metal detectors.  Ask the security personnel to use the hand wand. - Avoid arc welding equipment, MRI testing (magnetic resonance imaging), TENS units (transcutaneous nerve stimulators).  Call the office for questions about other devices. - Avoid electrical appliances that are in poor condition or are not properly grounded. - Microwave ovens are safe to be near or to operate.  Additional information for defibrillator patients should your device go off: - If your device goes off ONCE and you feel fine afterward, notify the device clinic nurses. - If your device goes off ONCE and you do not feel well afterward, call 911. - If your device goes off TWICE, call 911. - If your device goes off THREE times in one day, call 911.  DO NOT DRIVE YOURSELF OR A FAMILY MEMBER WITH A DEFIBRILLATOR TO THE HOSPITAL-CALL 911.      Physical Exam: Vitals:   05/16/16 1405 05/16/16 2050 05/17/16 0500 05/17/16 0824  BP: (!) 111/97 (!) 155/62 (!) 147/84 127/76  Pulse: 70 85 71 75  Resp: 20 (!) 26 15 (!) 22  Temp:  98.7 F (37.1 C) 97.7 F (36.5 C) 98.4 F (36.9 C)  TempSrc:  Oral  Oral  SpO2: 96% 96% 99% 96%  Weight:   169 lb 8 oz (76.9 kg)   Height:        GEN- The patient is well appearing, alert and oriented x 3 today.   HEENT: normocephalic, atraumatic (bandage from prior skin procedure is clean/dry); sclera clear, conjunctiva pink; hearing intact; oropharynx clear; neck supple, no JVP Lungs- Clear to ausculation bilaterally, normal work of breathing.  No wheezes, rales,  rhonchi Heart- Regular rate and rhythm, 1-2/6 SM, no rubs or gallops, PMI not laterally displaced GI- soft, non-tender, non-distended Extremities- no clubbing, cyanosis, or edema MS- no significant deformity or atrophy Skin- warm and dry, no rash or lesion, left chest without hematoma/ecchymosis Psych- euthymic mood, full affect Neuro- no gross deficits   Labs:   Lab Results  Component Value Date   WBC 8.5 05/17/2016   HGB 10.8 (L) 05/17/2016   HCT 33.4 (L) 05/17/2016   MCV 94.4 05/17/2016   PLT 188 05/17/2016     Recent Labs Lab 05/17/16 0340  NA 140  K 4.2  CL 109  CO2 21*  BUN 23*  CREATININE 1.02  CALCIUM 8.7*  GLUCOSE 134*    Discharge Medications:  Medication List    TAKE these medications   acetaminophen 325 MG tablet Commonly known as:  TYLENOL Take 325-650 mg by mouth every 6 (six) hours as needed for mild pain or moderate pain.   amiodarone 200 MG tablet Commonly known as:  PACERONE Take 1 tablet (200 mg total) by mouth daily. Start taking on:  05/18/2016   amLODipine 10 MG tablet Commonly known as:  NORVASC Take 1 tablet (10 mg total) by mouth daily.   apixaban 5 MG Tabs tablet Commonly known as:  ELIQUIS Take 1 tablet (5 mg total) by mouth 2 (two) times daily. Notes to patient:  Do not start until Monday 05/20/16   aspirin 81 MG tablet Take 81 mg by mouth every evening.   atorvastatin 40 MG tablet Commonly known as:  LIPITOR TAKE 1 TABLET DAILY AT 6 P.M. (KEEP OFFICE VISIT)   cetirizine 10 MG tablet Commonly known as:  ZYRTEC Take 10 mg by mouth daily as needed for allergies.   clopidogrel 75 MG tablet Commonly known as:  PLAVIX TAKE 1 TABLET DAILY (KEEP OFFICE VISIT) Notes to patient:  Last dose is 05/19/16, then discontinue   gabapentin 300 MG capsule Commonly known as:  NEURONTIN Take 1 capsule (300 mg total) by mouth at bedtime.   hydrALAZINE 10 MG tablet Commonly known as:  APRESOLINE Take 2 tablets (20 mg total) by  mouth every 8 (eight) hours.   lisinopril 10 MG tablet Commonly known as:  PRINIVIL,ZESTRIL Take 1 tablet (10 mg total) by mouth daily.   metformin 1000 MG (OSM) 24 hr tablet Commonly known as:  FORTAMET Take 1,000 mg by mouth 2 (two) times daily with a meal.   metoprolol 50 MG tablet Commonly known as:  LOPRESSOR Take 1 tablet (50 mg total) by mouth 2 (two) times daily.   multivitamin-iron-minerals-folic acid chewable tablet Chew 1 tablet by mouth daily.   PRESERVISION AREDS 2 PO Take 1 capsule by mouth 2 (two) times daily.   niacin 750 MG CR tablet Commonly known as:  NIASPAN Take 2 tablets (1,500 mg total) by mouth at bedtime.   nitroGLYCERIN 0.4 MG SL tablet Commonly known as:  NITROSTAT Place 1 tablet (0.4 mg total) under the tongue every 5 (five) minutes as needed for chest pain.   oxymetazoline 0.05 % nasal spray Commonly known as:  AFRIN Place 2 sprays into both nostrils 3 (three) times daily as needed for congestion (Allergies).   polycarbophil 625 MG tablet Commonly known as:  FIBERCON Take 1,250 mg by mouth daily.   potassium chloride SA 20 MEQ tablet Commonly known as:  K-DUR,KLOR-CON Take 1 tablet (20 mEq total) by mouth daily.   RECLAST 5 MG/100ML Soln injection Generic drug:  zoledronic acid Inject 5 mg into the vein once.   torsemide 20 MG tablet Commonly known as:  DEMADEX Take 2 tablets (40 mg total) by mouth 2 (two) times daily.       Disposition: SNF Discharge Instructions    Diet - low sodium heart healthy    Complete by:  As directed    Increase activity slowly    Complete by:  As directed      Follow-up Information    [facility] [employer] [facility] Follow up on 05/29/2016.   Specialty:  Cardiology Why:  8:00AM, wound check Contact information: [address] [place] [address] [phone]       Lewayne Bunting, MD Follow up on 08/16/2016.   Specialty:  Cardiology  Why:  11:30AM Contact  information: [address] [place] [address] [phone]           Duration of Discharge Encounter: Greater than 30 minutes including physician time.  Norma FredricksonSigned, Renee Ursuy, PA-C 05/17/2016 9:17 AM  EP Attending  Patient seen and examined. He is doing well after biv ppm insertion. His PPM has been interrogated and is working normally. His CXR has been reviewed. His ECG demonstrates a marked narrowing of his QRS. He will be discharged with usual followup. He will hold his OAC for 72 hours from now.   Leonia ReevesGregg Mirissa Lopresti,M.D.

## 2016-05-16 NOTE — Discharge Instructions (Signed)
° ° °  Supplemental Discharge Instructions for  Pacemaker/Defibrillator Patients  Activity No heavy lifting or vigorous activity with your left/right arm for 6 to 8 weeks.  Do not raise your left/right arm above your head for one week.  Gradually raise your affected arm as drawn below.             05/20/16                   05/21/16                  05/22/16                05/23/16 __  NO DRIVING  until cleared to at your wound check visit.  WOUND CARE - Keep the wound area clean and dry.  Do not get this area wet for one week. No showers for one week; you may shower on 05/23/16    . - The tape/steri-strips on your wound will fall off; do not pull them off.  No bandage is needed on the site.  DO  NOT apply any creams, oils, or ointments to the wound area. - If you notice any drainage or discharge from the wound, any swelling or bruising at the site, or you develop a fever > 101? F after you are discharged home, call the office at once.  Special Instructions - You are still able to use cellular telephones; use the ear opposite the side where you have your pacemaker/defibrillator.  Avoid carrying your cellular phone near your device. - When traveling through airports, show security personnel your identification card to avoid being screened in the metal detectors.  Ask the security personnel to use the hand wand. - Avoid arc welding equipment, MRI testing (magnetic resonance imaging), TENS units (transcutaneous nerve stimulators).  Call the office for questions about other devices. - Avoid electrical appliances that are in poor condition or are not properly grounded. - Microwave ovens are safe to be near or to operate.  Additional information for defibrillator patients should your device go off: - If your device goes off ONCE and you feel fine afterward, notify the device clinic nurses. - If your device goes off ONCE and you do not feel well afterward, call 911. - If your device goes off  TWICE, call 911. - If your device goes off THREE times in one day, call 911.  DO NOT DRIVE YOURSELF OR A FAMILY MEMBER WITH A DEFIBRILLATOR TO THE HOSPITAL--CALL 911.

## 2016-05-16 NOTE — ED Provider Notes (Signed)
[facility]-EMERGENCY DEPT Provider Note   CSN: [id number] Arrival date & time: 05/16/16  0436     History   Chief Complaint Chief Complaint  Patient presents with  . Chest Pain    HPI Melvin [name] is a 82 y.o. male.  Level V caveat for acuity of condition. Patient brought in by EMS with chest pain wide-complex tachycardia. States he woke from sleep around 3 AM with pressure in his chest radiating to his left arm associated with shortness of breath. EMS found him to be in a wide-complex tachycardia. History notable for aortic stenosis status post valve replacement 2 weeks ago. Patient also admitted recently with complete heart block which resolved and pacemaker was not placed. Patient states medications were adjusted. History of CHF and EF 35%, ischemic cardiomyopathy, left bundle branch block, aortic stenosis. Patient is not anticoagulated.   The history is provided by the patient and the EMS personnel. The history is limited by the condition of the patient.  Chest Pain      Past Medical History:  Diagnosis Date  . Age-related macular degeneration, dry, both eyes   . Arthritis    "just about all over my body"  . Chronic combined systolic and diastolic CHF (congestive heart failure) (HCC)    a. 11/2015 Echo: EF 30-35%, diff HK, Gr1 DD.  Marland Kitchen. Coronary artery disease    a. 1991 PTCA of LAD; b. 1998 CABG x 5 (LIMA->LAD, VG->RPDA->RPLA, VG->OM1, VG->D2; c. 09/2014 MV: lat ischemia, EF 37%; d. 10/2014 PCI: LAD 100, LCX 99ost/100p, RCA 100p, LIMA->LAD ok, VG->D2 ok, VG->OM1 95(3.0x15 Resolute Integrity DES), VG->PDA ok w/ 30% w/in VG->PL.  Marland Kitchen. GERD (gastroesophageal reflux disease)   . High cholesterol   . History of hiatal hernia   . Hypertension   . Ischemic cardiomyopathy    a. 11/2015 Echo: EF 30-35%, diff HK, Gr1 DD.  Marland Kitchen. LBBB (left bundle branch block)   . Moderate aortic stenosis    a. 11/2015 Echo: EF 30-35%, diff HK, Gr1 DD, mod AS, mild AI, mildly dil Ao root and Asc Ao, mild MR,  mod dil LA, PASP 46mmHg.  . Osteoporosis   . PAD (peripheral artery disease) (HCC)    a. s/p prior rotational atherectomy of L pop and tib trunk and PTA of focal popliteal lesions; b. 03/2015 ABI: R - 0.88, L 0.96, LCFA 50-79%, RSFA 50-79% distal stenosis, 3 vessel runoff.  . Palpitations    a. 06/2012 Event monitor: freq ventricular ectopy w/o VT.  Marland Kitchen. PVC (premature ventricular contraction) 04/26/1997   on beta blocker/pt had cardio net monitor 06/23/2012  . RLS (restless legs syndrome) 10/01/2012  . Type II diabetes mellitus (HCC)   . Unspecified hereditary and idiopathic peripheral neuropathy 10/01/2012    Patient Active Problem List   Diagnosis Date Noted  . Complete heart block (HCC) 05/11/2016  . S/P TAVR (transcatheter aortic valve replacement) 05/11/2016  . Severe aortic valve stenosis 04/30/2016  . Palpitations 06/17/2015  . Skin lesion 02/11/2015  . Unstable angina pectoris (HCC) 11/03/2014  . CAD in native artery 09/28/2014  . SOB (shortness of breath) 04/26/2014  . Tachycardia 04/26/2014  . Non-insulin dependent type 2 diabetes mellitus (HCC) 07/02/2013  . Osteoporosis, unspecified 06/24/2013  . Edema, lower extremity 06/22/2013  . LBBB (left bundle branch block) 12/01/2012  . Heart palpitations 12/01/2012  . Aortic stenosis 12/01/2012  . Unspecified hereditary and idiopathic peripheral neuropathy 10/01/2012  . RLS (restless legs syndrome) 10/01/2012  . Hypertension 11/26/2011  . High cholesterol  11/26/2011  . Liver lesion 11/26/2011    Past Surgical History:  Procedure Laterality Date  . APPENDECTOMY    . CARDIAC CATHETERIZATION  04/26/1997   CAD - 50-60% prox to mid LAD with 70% stenosis in mid-distal, 70-80% 1st diag stenosis, 50-60% ostial stenosis in large 2nd diagonal; diffuse RCA stenosis with 40-50% osital narrowing with 70% prox stenosis (Dr. Bishop Limbo) >> CABG  . CARDIAC CATHETERIZATION  11/01/2014  . CARDIAC CATHETERIZATION N/A 04/05/2016   Procedure:  Right/Left Heart Cath and Coronary/Graft Angiography;  Surgeon: Lennette Bihari, MD;  Location: [facility] INVASIVE CV LAB;  Service: Cardiovascular;  Laterality: N/A;  . CARDIAC CATHETERIZATION N/A 05/11/2016   Procedure: Temporary Pacemaker;  Surgeon: Yates Decamp, MD;  Location: [facility] INVASIVE CV LAB;  Service: Cardiovascular;  Laterality: N/A;  . CATARACT EXTRACTION W/ INTRAOCULAR LENS  IMPLANT, BILATERAL Bilateral   . CORONARY ARTERY BYPASS GRAFT  04/1997   CABGx5 - LIMA to LAD, SVG to PDA, SVG to PLA, VG to OM1, VG to 2nd diagonal (Dr. Dorris Fetch)  . DOPPLER ECHOCARDIOGRAPHY  05/18/2012   EF 45-50%, LV systolic function mildly reduced; borderline LA enlargment; mild mitral annular calcif, mild-mod MR; mild TR with normal RVSP; mild calcification of AV leaflets and mild-mod valvular AS with mild regurg      . DOPPLER ECHOCARDIOGRAPHY  08/19/2011   EF 50-55%, mod conc LVH; mild mitral annular calcif with mild MR; mild-mod TR; AV mildly sclerotic with mild valvular AS and mild regurg, mild aortic root dilatation  . HERNIA REPAIR    . HIATAL HERNIA REPAIR    . INGUINAL HERNIA REPAIR Bilateral   . LEFT AND RIGHT HEART CATHETERIZATION WITH CORONARY ANGIOGRAM N/A 11/01/2014   Procedure: LEFT AND RIGHT HEART CATHETERIZATION WITH CORONARY ANGIOGRAM;  Surgeon: Lennette Bihari, MD; EF35-40%, mild AS w/ 2+ AI, LAD, CFX, RCA 100%, LIMA-LAD OK, SVG-D1 OK, SVG-PDA 30%, SVG-OM 95%  . LOWER EXTREMITY ANGIOGRAM  02/05/2011   Diamondback orbital rotational atherectomy, percutaneous transluminal angioplasty of high-grade calcified popliteal & tibioperoneal stenosis (Dr. Erlene Quan)  . LOWER EXTREMITY ARTERIAL DOPPLER  02/2012   RLE - mild to mod arterial insuff; RSFA 50-69% diameter reduction; R pop 50-69% diameter reduction; posterior tibial (R) demonstrates occlusive disease; LSFA/prox pop 0-49% diameter reduction; L distal pop 50-69% diameter reduction; posterior tibial (L) demonstrates occlusive disease  . NM MYOCAR PERF  EJECTION FRACTION  09/2011   lexiscan - no reversible ischmai, fixed anteroseptal defect r/t LBBB; EF 51%; septal hypokinesis; low risk but abnormal  . PERCUTANEOUS CORONARY STENT INTERVENTION (PCI-S) N/A 11/02/2014   Procedure: PERCUTANEOUS CORONARY STENT INTERVENTION (PCI-S);  Surgeon: Lennette Bihari, MD; 3.015 mm Resolute integrity DES to the SVG-OM   . TEE WITHOUT CARDIOVERSION N/A 04/30/2016   Procedure: TRANSESOPHAGEAL ECHOCARDIOGRAM (TEE);  Surgeon: Kathleene Hazel, MD;  Location: [facility] OR;  Service: Open Heart Surgery;  Laterality: N/A;  . TONSILLECTOMY    . TRANSCATHETER AORTIC VALVE REPLACEMENT, TRANSFEMORAL N/A 04/30/2016   Procedure: TRANSCATHETER AORTIC VALVE REPLACEMENT, TRANSFEMORAL;  Surgeon: Kathleene Hazel, MD;  Location: [facility];  Service: Open Heart Surgery;  Laterality: N/A;       Home Medications    Prior to Admission medications   Medication Sig Start Date End Date Taking? Authorizing Provider  amLODipine (NORVASC) 10 MG tablet Take 1 tablet (10 mg total) by mouth daily. 05/03/16   Little Ishikawa, NP  aspirin 81 MG tablet Take 81 mg by mouth every evening.     Historical Provider,  MD  atorvastatin (LIPITOR) 40 MG tablet TAKE 1 TABLET DAILY AT 6 P.M. (KEEP OFFICE VISIT) 02/05/16   Lennette Bihari, MD  cetirizine (ZYRTEC) 10 MG tablet Take 10 mg by mouth daily as needed for allergies.     Historical Provider, MD  clopidogrel (PLAVIX) 75 MG tablet TAKE 1 TABLET DAILY (KEEP OFFICE VISIT) 02/05/16   Lennette Bihari, MD  gabapentin (NEURONTIN) 300 MG capsule Take 1 capsule (300 mg total) by mouth at bedtime. 10/26/15   Huston Foley, MD  hydrALAZINE (APRESOLINE) 10 MG tablet Take 2 tablets (20 mg total) by mouth every 8 (eight) hours. 01/03/16   Lennette Bihari, MD  lisinopril (PRINIVIL,ZESTRIL) 10 MG tablet Take 1 tablet (10 mg total) by mouth daily. 05/15/16   Renee Norberto Sorenson, PA-C  metformin (FORTAMET) 1000 MG (OSM) 24 hr tablet Take 1,000 mg by mouth 2 (two) times daily  with a meal.    Historical Provider, MD  Multiple Vitamins-Minerals (PRESERVISION AREDS 2 PO) Take 1 capsule by mouth 2 (two) times daily.     Historical Provider, MD  multivitamin-iron-minerals-folic acid (CENTRUM) chewable tablet Chew 1 tablet by mouth daily.    Historical Provider, MD  niacin (NIASPAN) 750 MG CR tablet Take 2 tablets (1,500 mg total) by mouth at bedtime. 01/22/16   Lennette Bihari, MD  nitroGLYCERIN (NITROSTAT) 0.4 MG SL tablet Place 1 tablet (0.4 mg total) under the tongue every 5 (five) minutes as needed for chest pain. 11/29/15   Lennette Bihari, MD  oxymetazoline (AFRIN) 0.05 % nasal spray Place 2 sprays into both nostrils 3 (three) times daily as needed for congestion (Allergies).    Historical Provider, MD  polycarbophil (FIBERCON) 625 MG tablet Take 1,250 mg by mouth daily.     Historical Provider, MD  potassium chloride SA (K-DUR,KLOR-CON) 20 MEQ tablet Take 1 tablet (20 mEq total) by mouth daily. 05/04/16   Little Ishikawa, NP  torsemide (DEMADEX) 20 MG tablet Take 2 tablets (40 mg total) by mouth 2 (two) times daily. 05/03/16   Little Ishikawa, NP    Family History Family History  Problem Relation Age of Onset  . CAD Mother   . Heart attack Father     Social History Social History  Substance Use Topics  . Smoking status: Never Smoker  . Smokeless tobacco: Never Used  . Alcohol use No     Allergies   Other and Pramipexole   Review of Systems Review of Systems  Unable to perform ROS: Acuity of condition  Cardiovascular: Positive for chest pain.     Physical Exam Updated Vital Signs BP 100/62   Pulse 61   Resp 24   Ht 5\' 6"  (1.676 m)   Wt 166 lb (75.3 kg)   SpO2 100%   BMI 26.79 kg/m   Physical Exam  Constitutional: He is oriented to person, place, and time. He appears well-developed and well-nourished. He appears distressed.  HENT:  Head: Normocephalic and atraumatic.  Right Ear: External ear normal.  Left Ear: External ear normal.  Eyes:  Conjunctivae and EOM are normal. Pupils are equal, round, and reactive to light.  Neck: Normal range of motion. Neck supple. JVD present.  Cardiovascular: Intact distal pulses.   Wide complex tachycardia on monitor 160s. Palpable pulses at 80s  Pulmonary/Chest: Effort normal and breath sounds normal. No respiratory distress.  Abdominal: Soft. Bowel sounds are normal. There is no tenderness.  Musculoskeletal: Normal range of motion. He exhibits no edema, tenderness  or deformity.  Neurological: He is alert and oriented to person, place, and time.  Skin: Skin is warm and dry. Capillary refill takes less than 2 seconds.     ED Treatments / Results  Labs (all labs ordered are listed, but only abnormal results are displayed) Labs Reviewed  BASIC METABOLIC PANEL - Abnormal; Notable for the following:       Result Value   Glucose, Bld 187 (*)    BUN 29 (*)    GFR calc non Af Amer 52 (*)    All other components within normal limits  CBC - Abnormal; Notable for the following:    RBC 3.73 (*)    Hemoglobin 11.5 (*)    HCT 35.0 (*)    All other components within normal limits  BRAIN NATRIURETIC PEPTIDE - Abnormal; Notable for the following:    B Natriuretic Peptide 151.5 (*)    All other components within normal limits  TROPONIN I - Abnormal; Notable for the following:    Troponin I 0.18 (*)    All other components within normal limits  GLUCOSE, CAPILLARY - Abnormal; Notable for the following:    Glucose-Capillary 163 (*)    All other components within normal limits  I-STAT TROPOININ, ED - Abnormal; Notable for the following:    Troponin i, poc 0.09 (*)    All other components within normal limits  PROTIME-INR  HEPARIN LEVEL (UNFRACTIONATED)  TROPONIN I  TROPONIN I  TSH    EKG  EKG Interpretation  Date/Time:  Thursday May 16 2016 04:53:33 EST Ventricular Rate:  103 PR Interval:    QRS Duration: 181 QT Interval:  395 QTC Calculation: 439 R Axis:   44 Text  Interpretation:  Atrial fibrillation Paired ventricular premature complexes Left bundle branch block now atrial fibrillation with LBBB Confirmed by Manus Gunning  MD, Moneisha Vosler [phone]) on 05/16/2016 5:06:55 AM       Radiology Dg Chest Portable 1 View  Result Date: 05/16/2016 CLINICAL DATA:  Chest pain and shortness of breath this morning. EXAM: PORTABLE CHEST 1 VIEW COMPARISON:  05/11/2016 FINDINGS: Postoperative changes in the mediastinum. Shallow inspiration. Cardiac enlargement. No pulmonary vascular congestion. Elevation of left hemidiaphragm with linear atelectasis in the lung bases. No focal consolidation. No blunting of costophrenic angles. No pneumothorax. Calcified and tortuous aorta. IMPRESSION: Cardiac enlargement without vascular congestion. Atelectasis in the lung bases with elevation of left hemidiaphragm. No change since prior study. Electronically Signed   By: Burman Nieves M.D.   On: 05/16/2016 05:14    Procedures .Cardioversion Date/Time: 05/16/2016 5:08 AM Performed by: Glynn Octave Authorized by: Glynn Octave   Consent:    Consent obtained:  Emergent situation and verbal   Consent given by:  Patient   Risks discussed:  Cutaneous burn, death, induced arrhythmia and pain Pre-procedure details:    Cardioversion basis:  Emergent   Rhythm:  Ventricular tachycardia   Electrode placement:  Anterior-posterior Attempt one:    Cardioversion mode:  Synchronous   Waveform:  Biphasic   Shock (Joules):  150   Shock outcome:  No change in rhythm Attempt two:    Cardioversion mode:  Synchronous   Waveform:  Biphasic   Shock (Joules):  200   Shock outcome:  No change in rhythm Attempt three:    Cardioversion mode:  Synchronous   Shock (Joules):  200   Shock outcome:  Conversion to other rhythm Post-procedure details:    Patient status:  Awake   Patient tolerance of procedure:  Tolerated  well, no immediate complications   (including critical care time)  Medications  Ordered in ED Medications  amiodarone (NEXTERONE PREMIX) 360-4.14 MG/200ML-% (1.8 mg/mL) IV infusion (60 mg/hr Intravenous New Bag/Given 05/16/16 0452)  amiodarone (NEXTERONE) 1.8 mg/mL load via infusion 150 mg (150 mg Intravenous Bolus from Bag 05/16/16 0442)  etomidate (AMIDATE) injection 5 mg (5 mg Intravenous Given 05/16/16 0450)     Initial Impression / Assessment and Plan / ED Course  I have reviewed the triage vital signs and the nursing notes.  Pertinent labs & imaging results that were available during my care of the patient were reviewed by me and considered in my medical decision making (see chart for details).  Clinical Course   Patient with wide complex tachycardia, chest pain and shortness of breath. Hypotension into the 80s systolic. EKG appears to be ventricular tachycardia versus atrial fibrillation with aberrancy. Discussed with Dr. Shirlee LatchMclean of cardiology recommends emergent cardioversion given patient's chest pain and hypotension. It appears this episode started at 3am as patient states he felt well before this.  Given his chest pain and hemodynamic instability, emergent cardioversion is indicated. Amiodarone bolus and gtt started.   Patient cardioverted. Required 3 attempts for rhythm to slow down to the 80s. Subsequently showed wide complex atrial fibrillation with left bundle-branch block.  Patient reports improved CP and SOB.  BP 130s systolic.  LBBB on EKG with atrial fibrillation.  Pacing pads remain in place.  Continue amiodarone gtt. D/w Dr. Shirlee LatchMcLean who will admit to CCU.  Procedural sedation Performed by: Glynn OctaveANCOUR, Leighanne Adolph Consent: Verbal consent obtained. Risks and benefits: risks, benefits and alternatives were discussed Required items: required blood products, implants, devices, and special equipment available Patient identity confirmed: arm band and provided demographic data Time out: Immediately prior to procedure a "time out" was called to verify the correct  patient, procedure, equipment, support staff and site/side marked as required.  Sedation type: moderate (conscious) sedation NPO time confirmed and considedered  Sedatives: ETOMIDATE  Physician Time at Bedside: 15  Vitals: Vital signs were monitored during sedation. Cardiac Monitor, pulse oximeter Patient tolerance: Patient tolerated the procedure well with no immediate complications. Comments: Pt with uneventful recovered. Returned to pre-procedural sedation baseline  CRITICAL CARE Performed by: Glynn OctaveANCOUR, Nikoloz Huy Total critical care time: 45 minutes Critical care time was exclusive of separately billable procedures and treating other patients. Critical care was necessary to treat or prevent imminent or life-threatening deterioration. Critical care was time spent personally by me on the following activities: development of treatment plan with patient and/or surrogate as well as nursing, discussions with consultants, evaluation of patient's response to treatment, examination of patient, obtaining history from patient or surrogate, ordering and performing treatments and interventions, ordering and review of laboratory studies, ordering and review of radiographic studies, pulse oximetry and re-evaluation of patient's condition.    Final Clinical Impressions(s) / ED Diagnoses   Final diagnoses:  Ventricular tachycardia [facility](HCC)    New Prescriptions New Prescriptions   No medications on file     Glynn OctaveStephen Nadalie Laughner, MD 05/16/16 [phone]

## 2016-05-16 NOTE — Consult Note (Signed)
ELECTROPHYSIOLOGY CONSULT NOTE    Patient ID: Melvin [name] MRN: [id number], DOB/AGE: March 12, 1928 82 y.o.  Admit date: 05/16/2016 Date of Consult: 05/16/2016  Primary Physician: Colette RibasGOLDING, [name], MD Primary Cardiologist: Drs. Tresa EndoKelly and [employer] Electrophysiologist: Dr. Ladona Ridgelaylor (new last admission)  Reason for Consultation: tachy-brady  HPI: Melvin [name] is a 82 y.o. male with PMHx of CAD remote CABG, VHD s/p TAVR last month with post-procedure PAF, HTN, HLD, DM, chronic CHF/mixed CM, most recently just discharged from here 05/14/16, admitted at that time with syncope secondary to CHB/bradycardia, his rate controlling meds held and his conduction recovered and discharged to SNF for rehab secondary to functional decline.    He returns overnight from SNF, reports he was woken with L arm pain, no CP, no SOB, he was found tachycardic and ES called, in the ED he was noted to be in WCT suspected to be RAFib, given low BP was cardioverted (requiring 2 shocks), and started on amiodarone gtt, this morning in SR w/PVC's.  He feels well this morning with no ongoing L arm pain at all, BP stable.  LABS K+ 3.8 BUN/Creat 29/1.21 BNP 151.5 poc Trop 0.09 Trop I: 0.18 H/H 11.5/35.0 WBC 8.7 plts 215  01/30/16 TSH 3.28   Past Medical History:  Diagnosis Date  . Age-related macular degeneration, dry, both eyes   . Arthritis    "just about all over my body"  . Chronic combined systolic and diastolic CHF (congestive heart failure) (HCC)    a. 11/2015 Echo: EF 30-35%, diff HK, Gr1 DD.  Marland Kitchen. Coronary artery disease    a. 1991 PTCA of LAD; b. 1998 CABG x 5 (LIMA->LAD, VG->RPDA->RPLA, VG->OM1, VG->D2; c. 09/2014 MV: lat ischemia, EF 37%; d. 10/2014 PCI: LAD 100, LCX 99ost/100p, RCA 100p, LIMA->LAD ok, VG->D2 ok, VG->OM1 95(3.0x15 Resolute Integrity DES), VG->PDA ok w/ 30% w/in VG->PL.  Marland Kitchen. GERD (gastroesophageal reflux disease)   . High cholesterol   . History of hiatal hernia   . Hypertension     . Ischemic cardiomyopathy    a. 11/2015 Echo: EF 30-35%, diff HK, Gr1 DD.  Marland Kitchen. LBBB (left bundle branch block)   . Moderate aortic stenosis    a. 11/2015 Echo: EF 30-35%, diff HK, Gr1 DD, mod AS, mild AI, mildly dil Ao root and Asc Ao, mild MR, mod dil LA, PASP 46mmHg.  . Osteoporosis   . PAD (peripheral artery disease) (HCC)    a. s/p prior rotational atherectomy of L pop and tib trunk and PTA of focal popliteal lesions; b. 03/2015 ABI: R - 0.88, L 0.96, LCFA 50-79%, RSFA 50-79% distal stenosis, 3 vessel runoff.  . Palpitations    a. 06/2012 Event monitor: freq ventricular ectopy w/o VT.  Marland Kitchen. PVC (premature ventricular contraction) 04/26/1997   on beta blocker/pt had cardio net monitor 06/23/2012  . RLS (restless legs syndrome) 10/01/2012  . Type II diabetes mellitus (HCC)   . Unspecified hereditary and idiopathic peripheral neuropathy 10/01/2012     Surgical History:  Past Surgical History:  Procedure Laterality Date  . APPENDECTOMY    . CARDIAC CATHETERIZATION  04/26/1997   CAD - 50-60% prox to mid LAD with 70% stenosis in mid-distal, 70-80% 1st diag stenosis, 50-60% ostial stenosis in large 2nd diagonal; diffuse RCA stenosis with 40-50% osital narrowing with 70% prox stenosis (Dr. Bishop Limbo. Kelly) >> CABG  . CARDIAC CATHETERIZATION  11/01/2014  . CARDIAC CATHETERIZATION N/A 04/05/2016   Procedure: Right/Left Heart Cath and Coronary/Graft Angiography;  Surgeon: Lennette Biharihomas A Kelly,  MD;  Location: [facility];  Service: Cardiovascular;  Laterality: N/A;  . CARDIAC CATHETERIZATION N/A 05/11/2016   Procedure: Temporary Pacemaker;  Surgeon: Yates Decamp, MD;  Location: [facility];  Service: Cardiovascular;  Laterality: N/A;  . CATARACT EXTRACTION W/ INTRAOCULAR LENS  IMPLANT, BILATERAL Bilateral   . CORONARY ARTERY BYPASS GRAFT  04/1997   CABGx5 - LIMA to LAD, SVG to PDA, SVG to PLA, VG to OM1, VG to 2nd diagonal (Dr. Dorris Fetch)  . DOPPLER ECHOCARDIOGRAPHY  05/18/2012   EF 45-50%, LV systolic  function mildly reduced; borderline LA enlargment; mild mitral annular calcif, mild-mod MR; mild TR with normal RVSP; mild calcification of AV leaflets and mild-mod valvular AS with mild regurg      . DOPPLER ECHOCARDIOGRAPHY  08/19/2011   EF 50-55%, mod conc LVH; mild mitral annular calcif with mild MR; mild-mod TR; AV mildly sclerotic with mild valvular AS and mild regurg, mild aortic root dilatation  . HERNIA REPAIR    . HIATAL HERNIA REPAIR    . INGUINAL HERNIA REPAIR Bilateral   . LEFT AND RIGHT HEART CATHETERIZATION WITH CORONARY ANGIOGRAM N/A 11/01/2014   Procedure: LEFT AND RIGHT HEART CATHETERIZATION WITH CORONARY ANGIOGRAM;  Surgeon: Lennette Bihari, MD; EF35-40%, mild AS w/ 2+ AI, LAD, CFX, RCA 100%, LIMA-LAD OK, SVG-D1 OK, SVG-PDA 30%, SVG-OM 95%  . LOWER EXTREMITY ANGIOGRAM  02/05/2011   Diamondback orbital rotational atherectomy, percutaneous transluminal angioplasty of high-grade calcified popliteal & tibioperoneal stenosis (Dr. Erlene Quan)  . LOWER EXTREMITY ARTERIAL DOPPLER  02/2012   RLE - mild to mod arterial insuff; RSFA 50-69% diameter reduction; R pop 50-69% diameter reduction; posterior tibial (R) demonstrates occlusive disease; LSFA/prox pop 0-49% diameter reduction; L distal pop 50-69% diameter reduction; posterior tibial (L) demonstrates occlusive disease  . NM MYOCAR PERF EJECTION FRACTION  09/2011   lexiscan - no reversible ischmai, fixed anteroseptal defect r/t LBBB; EF 51%; septal hypokinesis; low risk but abnormal  . PERCUTANEOUS CORONARY STENT INTERVENTION (PCI-S) N/A 11/02/2014   Procedure: PERCUTANEOUS CORONARY STENT INTERVENTION (PCI-S);  Surgeon: Lennette Bihari, MD; 3.015 mm Resolute integrity DES to the SVG-OM   . TEE WITHOUT CARDIOVERSION N/A 04/30/2016   Procedure: TRANSESOPHAGEAL ECHOCARDIOGRAM (TEE);  Surgeon: Kathleene Hazel, MD;  Location: [facility] OR;  Service: Open Heart Surgery;  Laterality: N/A;  . TONSILLECTOMY    . TRANSCATHETER AORTIC VALVE REPLACEMENT,  TRANSFEMORAL N/A 04/30/2016   Procedure: TRANSCATHETER AORTIC VALVE REPLACEMENT, TRANSFEMORAL;  Surgeon: Kathleene Hazel, MD;  Location: [facility] OR;  Service: Open Heart Surgery;  Laterality: N/A;     Prescriptions Prior to Admission  Medication Sig Dispense Refill Last Dose  . acetaminophen (TYLENOL) 325 MG tablet Take 325-650 mg by mouth every 6 (six) hours as needed for mild pain or moderate pain.   unk  . amLODipine (NORVASC) 10 MG tablet Take 1 tablet (10 mg total) by mouth daily. 30 tablet 12 05/15/2016 at Unknown time  . aspirin 81 MG tablet Take 81 mg by mouth every evening.    05/15/2016 at Unknown time  . atorvastatin (LIPITOR) 40 MG tablet TAKE 1 TABLET DAILY AT 6 P.M. (KEEP OFFICE VISIT) 90 tablet 2 05/15/2016 at Unknown time  . cetirizine (ZYRTEC) 10 MG tablet Take 10 mg by mouth daily as needed for allergies.    unk  . clopidogrel (PLAVIX) 75 MG tablet TAKE 1 TABLET DAILY (KEEP OFFICE VISIT) 90 tablet 2 05/15/2016 at Unknown time  . gabapentin (NEURONTIN) 300 MG capsule Take 1  capsule (300 mg total) by mouth at bedtime. 90 capsule 3 05/15/2016 at Unknown time  . hydrALAZINE (APRESOLINE) 10 MG tablet Take 2 tablets (20 mg total) by mouth every 8 (eight) hours. 270 tablet 1 05/16/2016 at Unknown time  . lisinopril (PRINIVIL,ZESTRIL) 10 MG tablet Take 1 tablet (10 mg total) by mouth daily. 30 tablet 3 05/15/2016 at Unknown time  . metformin (FORTAMET) 1000 MG (OSM) 24 hr tablet Take 1,000 mg by mouth 2 (two) times daily with a meal.   05/15/2016 at Unknown time  . Multiple Vitamins-Minerals (PRESERVISION AREDS 2 PO) Take 1 capsule by mouth 2 (two) times daily.    05/15/2016 at Unknown time  . multivitamin-iron-minerals-folic acid (CENTRUM) chewable tablet Chew 1 tablet by mouth daily.   05/15/2016 at Unknown time  . niacin (NIASPAN) 750 MG CR tablet Take 2 tablets (1,500 mg total) by mouth at bedtime. 180 tablet 1 05/15/2016 at Unknown time  . nitroGLYCERIN (NITROSTAT) 0.4 MG SL tablet Place 1  tablet (0.4 mg total) under the tongue every 5 (five) minutes as needed for chest pain. 25 tablet 3 unk  . oxymetazoline (AFRIN) 0.05 % nasal spray Place 2 sprays into both nostrils 3 (three) times daily as needed for congestion (Allergies).   unk  . polycarbophil (FIBERCON) 625 MG tablet Take 1,250 mg by mouth daily.    05/15/2016 at Unknown time  . potassium chloride SA (K-DUR,KLOR-CON) 20 MEQ tablet Take 1 tablet (20 mEq total) by mouth daily. 30 tablet 12 05/15/2016 at Unknown time  . torsemide (DEMADEX) 20 MG tablet Take 2 tablets (40 mg total) by mouth 2 (two) times daily. 120 tablet 12 05/15/2016 at Unknown time  . zoledronic acid (RECLAST) 5 MG/100ML SOLN injection Inject 5 mg into the vein once.   due 07/2016    Inpatient Medications:  . amLODipine  10 mg Oral Daily  . [START ON 05/17/2016] aspirin EC  81 mg Oral Daily  . atorvastatin  40 mg Oral q1800  . clopidogrel  75 mg Oral Daily  . gabapentin  300 mg Oral QHS  . insulin aspart  0-15 Units Subcutaneous TID WC  . insulin aspart  0-5 Units Subcutaneous QHS  . lisinopril  10 mg Oral Daily  . potassium chloride SA  20 mEq Oral Daily  . torsemide  40 mg Oral BID    Allergies:  Allergies  Allergen Reactions  . Other     Patient is allergic to something but doesn't know the name.  . Pramipexole Swelling    Social History   Social History  . Marital status: Married    Spouse name: Maralyn Sago  . Number of children: 1  . Years of education: College   Occupational History  .  Retired   Social History Main Topics  . Smoking status: Never Smoker  . Smokeless tobacco: Never Used  . Alcohol use No  . Drug use: No  . Sexual activity: No   Other Topics Concern  . Not on file   Social History Narrative   Pt lives at home with his spouse.   Has been married 49yrs.   Caffeine ZOX:WRUEAV, is right handed     Family History  Problem Relation Age of Onset  . CAD Mother   . Heart attack Father      Review of Systems: All  other systems reviewed and are otherwise negative except as noted above.  Physical Exam: Vitals:   05/16/16 0600 05/16/16 0615 05/16/16 0650 05/16/16 4098  BP: 139/64 131/58 (!) 136/56   Pulse: 78 90 88   Resp: 20 24 (!) 22   Temp:   97.8 F (36.6 C) 97.6 F (36.4 C)  TempSrc:   Oral Oral  SpO2: 100% 100% 97%   Weight:      Height:        GEN- The patient is well appearing, alert and oriented x 3 today.   HEENT: normocephalic, atraumatic; sclera clear, conjunctiva pink; hearing intact; oropharynx clear; neck supple, no JVP Lymph- no cervical lymphadenopathy Lungs- Clear to ausculation bilaterally, normal work of breathing.  No wheezes, rales, rhonchi Heart- Regular rate and rhythm, 1-2/6 SM, rubs or gallops, PMI not laterally displaced GI- soft, non-tender, non-distended Extremities- no clubbing, cyanosis, or edema, chronic looking skin changes MS- no significant deformity or atrophy Skin- warm and dry, no rash or lesion Psych- euthymic mood, full affect Neuro- no gross deficits observed  Labs:   Lab Results  Component Value Date   WBC 8.7 05/16/2016   HGB 11.5 (L) 05/16/2016   HCT 35.0 (L) 05/16/2016   MCV 93.8 05/16/2016   PLT 215 05/16/2016    Recent Labs Lab 05/16/16 0444  NA 138  K 3.8  CL 104  CO2 23  BUN 29*  CREATININE 1.21  CALCIUM 9.2  GLUCOSE 187*      Radiology/Studies:  IMPRESSION: Cardiac enlargement without vascular congestion. Atelectasis in the lung bases with elevation of left hemidiaphragm. No change since prior study.  EKG: Presenting WCT 159bpm, irregular rhythm, LBBB #2, is irregular, though likely SR, PVCs, LBBB #3 is SR, PVC's, LBBB TELEMETRY: WCT that was cardioverted into irregular rhythm, likely Afib, currently is in SR, frequent PVCs  05/01/16: TTE (post TAVR) Study Conclusions - Left ventricle: The cavity size was normal. There was moderate   concentric hypertrophy. Systolic function was mildly reduced. The   estimated  ejection fraction was in the range of 45% to 50%.   Images were inadequate for LV wall motion assessment. There was   an increased relative contribution of atrial contraction to   ventricular filling. Doppler parameters are consistent with   abnormal left ventricular relaxation (grade 1 diastolic   dysfunction). - Ventricular septum: Septal motion showed moderate paradox. These   changes are consistent with intraventricular conduction delay. - Aortic valve: S/P TAVR with Sapien 3 bioprosthesis with normal   function. There is no perivalvular leak. A bioprosthesis was   present. Mean gradient (S): 10 mm Hg. Valve area (VTI): 2.48   cm^2. Valve area (Vmax): 2.22 cm^2. Valve area (Vmean): 2.46   cm^2. - Mitral valve: There was mild regurgitation. - Pulmonic valve: There was trivial regurgitation.  04/05/16 [facility] RECOMMENDATION: The present catheterization continues to demonstrate patency to the vein graft conduits as well as the left internal mammary artery with severe native CAD.  The patient has experienced a definite decline in functional capacity and on recent echo Doppler study EF has declined to 35%.  I have recommended that he follow-up with Dr. Sanjuana Kava to proceed with TAVR and additional imaging requirements prior to final recommendations.  Assessment and Plan:   1. Tachy-Brady syndrome      Will need pacing 2.  Chronic CHF, (systolic/diastolic)      EF 45-50%, (previously 30's), given CHB will plan CRT-P      Exam and xray appear compensated  Discussed PPM implant, risks/benefits, the patient is agreeable to proceed, mentioning it was discussed last admission with him as well.  3. PAFib  Record indicates post TAVR had some PAF, not on a/c, discussed would need a/c only if recurred)     CHA2DS2Vasc is 6, given his VHD, will a/c will need to be warfarin     Will hold heparin gtt for implant  4. VHD     S/p TAVR 04/30/16  5. CAD      L arm pain associated with RAFib,  remains resolved      Trop 0.18      Cath in Sept, patent grafts, likely secondary to Liberty MediaAFib   Signed, Renee Ursuy, PA-C 05/16/2016 8:48 AM  EP Attending  Patient seen and examined. Agree with above. He presents after he developed atrial fib with a RVR. In the setting of CHB, will plan to proceed with BiV PPM.   Leonia ReevesGregg Taylor,M.D.

## 2016-05-16 NOTE — ED Notes (Signed)
Patient synchronized shock at 200J.  Patient remains in BartelsoVtach.

## 2016-05-16 NOTE — ED Notes (Signed)
Pt wife Maple HudsonSara Dise  Home [phone] Cell [phone]

## 2016-05-16 NOTE — Progress Notes (Signed)
ANTICOAGULATION CONSULT NOTE - Initial Consult  Pharmacy Consult for Heparin  Indication: atrial fibrillation  Allergies  Allergen Reactions  . Other     Patient is allergic to something but doesn't know the name.  . Pramipexole Swelling    Patient Measurements: Height: 5\' 6"  (167.6 cm) Weight: 166 lb (75.3 kg) IBW/kg (Calculated) : 63.8 Vital Signs: Temp: 97.8 F (36.6 C) (11/09 0440) Temp Source: Oral (11/09 0440) BP: 133/75 (11/09 0515) Pulse Rate: 81 (11/09 0515)  Labs:  Recent Labs  05/16/16 0444  HGB 11.5*  HCT 35.0*  PLT 215  LABPROT 12.7  INR 0.95  CREATININE 1.21    Estimated Creatinine Clearance: 39.5 mL/min (by C-G formula based on SCr of 1.21 mg/dL).   Medical History: Past Medical History:  Diagnosis Date  . Age-related macular degeneration, dry, both eyes   . Arthritis    "just about all over my body"  . Chronic combined systolic and diastolic CHF (congestive heart failure) (HCC)    a. 11/2015 Echo: EF 30-35%, diff HK, Gr1 DD.  Marland Kitchen. Coronary artery disease    a. 1991 PTCA of LAD; b. 1998 CABG x 5 (LIMA->LAD, VG->RPDA->RPLA, VG->OM1, VG->D2; c. 09/2014 MV: lat ischemia, EF 37%; d. 10/2014 PCI: LAD 100, LCX 99ost/100p, RCA 100p, LIMA->LAD ok, VG->D2 ok, VG->OM1 95(3.0x15 Resolute Integrity DES), VG->PDA ok w/ 30% w/in VG->PL.  Marland Kitchen. GERD (gastroesophageal reflux disease)   . High cholesterol   . History of hiatal hernia   . Hypertension   . Ischemic cardiomyopathy    a. 11/2015 Echo: EF 30-35%, diff HK, Gr1 DD.  Marland Kitchen. LBBB (left bundle branch block)   . Moderate aortic stenosis    a. 11/2015 Echo: EF 30-35%, diff HK, Gr1 DD, mod AS, mild AI, mildly dil Ao root and Asc Ao, mild MR, mod dil LA, PASP 46mmHg.  . Osteoporosis   . PAD (peripheral artery disease) (HCC)    a. s/p prior rotational atherectomy of L pop and tib trunk and PTA of focal popliteal lesions; b. 03/2015 ABI: R - 0.88, L 0.96, LCFA 50-79%, RSFA 50-79% distal stenosis, 3 vessel runoff.  .  Palpitations    a. 06/2012 Event monitor: freq ventricular ectopy w/o VT.  Marland Kitchen. PVC (premature ventricular contraction) 04/26/1997   on beta blocker/pt had cardio net monitor 06/23/2012  . RLS (restless legs syndrome) 10/01/2012  . Type II diabetes mellitus (HCC)   . Unspecified hereditary and idiopathic peripheral neuropathy 10/01/2012     Assessment: 82 y/o M from SNF with wide complex tachycardia, switched to atrial fibrillation after synchronized cardioversion. Starting heparin. Hgb 11.5. Renal function ok. Other labs reviewed. PTA meds reviewed.   Goal of Therapy:  Heparin level 0.3-0.7 units/ml Monitor platelets by anticoagulation protocol: Yes   Plan:  -Heparin 4000 units BOLUS -Start heparin drip at 1200 units/hr -1400 HL -Daily CBC/HL -Monitor for bleeding  Abran DukeLedford, Kalisa Girtman 05/16/2016,5:35 AM

## 2016-05-16 NOTE — H&P (Addendum)
Physician History and Physical    Melvin [name] MRN: [id number] DOB/AGE: [birth year] 80 y.o. Admit date: 05/16/2016  Primary Care Physician: Phillips Odor Primary Cardiologist: Tresa Endo  HPI: 82 yo with history of CAD s/p CABG, aortic stenosis s/p TAVR, cardiomyopathy, paroxysmal atrial fibrillation, and prior complete heart block presented with atrial fibrillation/RVR.  Patient had developed increased exertional dyspnea earlier this year.  Echo in 5/17 showed EF 30-35% with low gradient severe AS.  Cardiac cath in 9/17 showed patent grafts.  He had TAVR in 10/17.  Post-procedural echo in 10/17 showed EF up to 45-50% with stable bioprosthetic aortic valve s/p TAVR.  He was then admitted on 05/11/16 with complete heart block.  Temporary transvenous pacemaker was placed and metoprolol was stopped.  HR recovered and he did not have a PPM placed.  Discharged to [facility] nursing center.  Had been doing well there, walking with walker without dyspnea.  He woke up this morning with central chest tightness.  SNF staff noted elevated HR and called EMS.  He was noted to be in a wide complex tachycardia with HR around 160 (has baseline LBBB).  In the ER, he had chest tightness and SBP was in 90s.  He had emergent cardioversion, followup ECG showed what appeared to be NSR with PVCs.  Initial rhythm on evaluation of ECG was likely atrial fibrillation with LBBB aberrancy.  Patient has now received amiodarone bolus and started on amiodarone gtt.  He is no longer having chest tightness.   Review of systems complete and found to be negative unless listed above   PMH: 1. Type II diabetes 2. HTN 3. H/o PAD 4. CAD: PTCA LAD in 1991.  CABG x 5 in 1998.  - DES to SVG-OM in 2016. - LHC (9/17): patent grafts.  5. Cardiomyopathy: Suspect mixed ischemic and valvular.  - Echo (5/17): EF 30-35%, suspected low gradient severe AS.  - Echo (10/17): EF 45-50%, stable bioprosthetic aortic valve s/p TAVR.  6. Aortic stenosis: Low  gradient severe.  Now s/p TAVR in 10/17 with 29 mm Sapien 3 valve.  7. Atrial fibrillation: Paroxysmal.  Noted initially after TAVR.  Then admitted 11/9 with afib/RVR.  8. Complete heart block: Admitted in 11/4 with CHB.  Had temporary pacemaker placed.  Recovered with stopping metoprolol and did not get pacemaker.  9. Chronic LBBB  Family History  Problem Relation Age of Onset  . CAD Mother   . Heart attack Father     Social History   Social History  . Marital status: Married    Spouse name: Maralyn Sago  . Number of children: 1  . Years of education: College   Occupational History  .  Retired   Social History Main Topics  . Smoking status: Never Smoker  . Smokeless tobacco: Never Used  . Alcohol use No  . Drug use: No  . Sexual activity: No   Other Topics Concern  . Not on file   Social History Narrative   Pt lives at home with his spouse.   Has been married 60yrs.   Caffeine WUJ:WJXBJY, is right handed    Current Facility-Administered Medications  Medication Dose Route Frequency Provider Last Rate Last Dose  . amiodarone (NEXTERONE PREMIX) 360-4.14 MG/200ML-% (1.8 mg/mL) IV infusion  30 mg/hr Intravenous Continuous Laurey Morale, MD 16.7 mL/hr at 05/16/16 0534 30 mg/hr at 05/16/16 0534  . heparin ADULT infusion 100 units/mL (25000 units/252mL sodium chloride 0.45%)  1,200 Units/hr Intravenous Continuous Stevphen Rochester, RPH      .  heparin bolus via infusion 4,000 Units  4,000 Units Intravenous Once Stevphen RochesterJames L Ledford, [facility] Ridge HospitalRPH       Current Outpatient Prescriptions  Medication Sig Dispense Refill  . amLODipine (NORVASC) 10 MG tablet Take 1 tablet (10 mg total) by mouth daily. 30 tablet 12  . aspirin 81 MG tablet Take 81 mg by mouth every evening.     Marland Kitchen. atorvastatin (LIPITOR) 40 MG tablet TAKE 1 TABLET DAILY AT 6 P.M. (KEEP OFFICE VISIT) 90 tablet 2  . cetirizine (ZYRTEC) 10 MG tablet Take 10 mg by mouth daily as needed for allergies.     Marland Kitchen. clopidogrel (PLAVIX) 75 MG tablet TAKE  1 TABLET DAILY (KEEP OFFICE VISIT) 90 tablet 2  . gabapentin (NEURONTIN) 300 MG capsule Take 1 capsule (300 mg total) by mouth at bedtime. 90 capsule 3  . hydrALAZINE (APRESOLINE) 10 MG tablet Take 2 tablets (20 mg total) by mouth every 8 (eight) hours. 270 tablet 1  . lisinopril (PRINIVIL,ZESTRIL) 10 MG tablet Take 1 tablet (10 mg total) by mouth daily. 30 tablet 3  . metformin (FORTAMET) 1000 MG (OSM) 24 hr tablet Take 1,000 mg by mouth 2 (two) times daily with a meal.    . Multiple Vitamins-Minerals (PRESERVISION AREDS 2 PO) Take 1 capsule by mouth 2 (two) times daily.     . multivitamin-iron-minerals-folic acid (CENTRUM) chewable tablet Chew 1 tablet by mouth daily.    . niacin (NIASPAN) 750 MG CR tablet Take 2 tablets (1,500 mg total) by mouth at bedtime. 180 tablet 1  . nitroGLYCERIN (NITROSTAT) 0.4 MG SL tablet Place 1 tablet (0.4 mg total) under the tongue every 5 (five) minutes as needed for chest pain. 25 tablet 3  . oxymetazoline (AFRIN) 0.05 % nasal spray Place 2 sprays into both nostrils 3 (three) times daily as needed for congestion (Allergies).    . polycarbophil (FIBERCON) 625 MG tablet Take 1,250 mg by mouth daily.     . potassium chloride SA (K-DUR,KLOR-CON) 20 MEQ tablet Take 1 tablet (20 mEq total) by mouth daily. 30 tablet 12  . torsemide (DEMADEX) 20 MG tablet Take 2 tablets (40 mg total) by mouth 2 (two) times daily. 120 tablet 12     (Not in a hospital admission)  Physical Exam: Blood pressure 133/75, pulse 81, temperature 97.8 F (36.6 C), temperature source Oral, resp. rate (!) 29, height 5\' 6"  (1.676 m), weight 166 lb (75.3 kg), SpO2 100 %.  General: NAD, frail Neck: JVP 8 cm, no thyromegaly or thyroid nodule.  Lungs: Clear to auscultation bilaterally with normal respiratory effort. CV: Nondisplaced PMI.  Heart regular S1/S2, no S3/S4, 2/6 SEM RUSB.  Trace ankle edema.  No carotid bruit.  Normal pedal pulses.  Abdomen: Soft, nontender, no hepatosplenomegaly, no  distention.  Skin: Intact without lesions or rashes.  Neurologic: Alert and oriented x 3.  Psych: Normal affect. Extremities: No clubbing or cyanosis.  HEENT: Normal.   Labs:   Lab Results  Component Value Date   WBC 8.7 05/16/2016   HGB 11.5 (L) 05/16/2016   HCT 35.0 (L) 05/16/2016   MCV 93.8 05/16/2016   PLT 215 05/16/2016    Recent Labs Lab 05/16/16 0444  NA 138  K 3.8  CL 104  CO2 23  BUN 29*  CREATININE 1.21  CALCIUM 9.2  GLUCOSE 187*  TnI 0.09  Radiology:  - CXR: Cardiac enlargement, elevation left hemidiaphragm  EKG:  - #1: rapid atrial fibrillation, LBBB aberrancy - #2: NSR with PVCs, LBBB  ASSESSMENT AND PLAN: 82 yo with history of CAD s/p CABG, aortic stenosis s/p TAVR, cardiomyopathy, paroxysmal atrial fibrillation, and prior complete heart block presented with atrial fibrillation/RVR. 1. Atrial fibrillation with RVR: Started early this morning.  Initial ECG appeared to be atrial fibrillation with RVR with LBBB aberrancy. He had emergent cardioversion given fall in blood pressure and ongoing chest tightness, now in NSR with PVCs.  He is on amiodarone to maintain NSR.  I am concerned about his risk for recurrent complete heart block given recent presentation on 11/14 (nodal blockers stopped at that point).  - Admit to CCU for monitoring.  - Keep pacing pads in place.  - For now, continue amiodarone gtt but will use lower dose 30 mg/hr.  - Heparin gtt given cardioversion. Long-term anticoagulation will be difficult => frail, on ASA and Plavix already for TAVR.  - Will ask EP to see today, may end up needing PPM.  2. H/o complete heart block: Recent (11/4), required temporary pacing.  Metoprolol stopped and CHB resolved.  Now will need to be back on nodal blockade so risk is higher.  As above, may end up needing PPM => with EF still low, favor CRT-P if pacing needing given LBBB.  3. Chronic systolic CHF: Mixed ischemic/valvular.  EF improved to 45-50% s/p TAVR.   He is not significantly volume overloaded on exam.  Would continue home lisinopril and torsemide.  4. Aortic stenosis s/p TAVR: Recent echo with stable bioprosthetic aortic valve s/p TAVR.  On ASA + Plavix s/p TAVR.  5. CAD: 9/17 cath with patent grafts. Had chest tightness in setting of atrial fibrillation with RVR.  Suspect demand ischemia.  Initial troponin 0.09.  Has baseline LBBB.   - Continue ASA/Plavix, statin,. - Cycle troponin.   Signed: Marca AnconaDalton Ethelle Ola 05/16/2016, 5:33 AM

## 2016-05-16 NOTE — ED Triage Notes (Addendum)
Patient from [facility].  Patient was admitted to the Center yesterday.  Patient woke up this morning around 0315 with chest pain, mild shortness of breath and pain radiating down left arm.  Denies any nausea or vomiting or diaphoresis.  Patient was given 324mg  of ASA and one SL nitro en route to ED.  VSS en route to ED.

## 2016-05-16 NOTE — ED Notes (Signed)
Patient synchronized shock at 150J. [place].

## 2016-05-16 NOTE — ED Notes (Signed)
Patient synchronized shock at 200J.  Rhythm change to Afib, controlled, rate in 80's.

## 2016-05-17 ENCOUNTER — Inpatient Hospital Stay (HOSPITAL_COMMUNITY): Payer: Medicare Other

## 2016-05-17 LAB — GLUCOSE, CAPILLARY
GLUCOSE-CAPILLARY: 219 mg/dL — AB (ref 65–99)
GLUCOSE-CAPILLARY: 181 mg/dL — AB (ref 65–99)
Glucose-Capillary: 144 mg/dL — ABNORMAL HIGH (ref 65–99)

## 2016-05-17 LAB — BASIC METABOLIC PANEL
Anion gap: 10 (ref 5–15)
BUN: 23 mg/dL — AB (ref 6–20)
CALCIUM: 8.7 mg/dL — AB (ref 8.9–10.3)
CO2: 21 mmol/L — ABNORMAL LOW (ref 22–32)
CREATININE: 1.02 mg/dL (ref 0.61–1.24)
Chloride: 109 mmol/L (ref 101–111)
GFR calc Af Amer: >60 mL/min (ref >60)
Glucose, Bld: 134 mg/dL — ABNORMAL HIGH (ref 65–99)
Potassium: 4.2 mmol/L (ref 3.5–5.1)
SODIUM: 140 mmol/L (ref 135–145)

## 2016-05-17 LAB — CBC
HCT: 33.4 % — ABNORMAL LOW (ref 39.0–52.0)
Hemoglobin: 10.8 g/dL — ABNORMAL LOW (ref 13.0–17.0)
MCH: 30.5 pg (ref 26.0–34.0)
MCHC: 32.3 g/dL (ref 30.0–36.0)
MCV: 94.4 fL (ref 78.0–100.0)
PLATELETS: 188 10*3/uL (ref 150–400)
RBC: 3.54 MIL/uL — ABNORMAL LOW (ref 4.22–5.81)
RDW: 14.4 % (ref 11.5–15.5)
WBC: 8.5 10*3/uL (ref 4.0–10.5)

## 2016-05-17 MED ORDER — AMIODARONE HCL 200 MG PO TABS: 200.0000 mg | ORAL_TABLET | Freq: Every day | ORAL | 3 refills | Status: AC

## 2016-05-17 MED ORDER — METOPROLOL TARTRATE 50 MG PO TABS: 50.0000 mg | ORAL_TABLET | Freq: Two times a day (BID) | ORAL | 3 refills | Status: AC

## 2016-05-17 MED ORDER — APIXABAN 5 MG PO TABS: 5.0000 mg | ORAL_TABLET | Freq: Two times a day (BID) | ORAL | 3 refills | Status: AC

## 2016-05-17 MED FILL — Heparin Sodium (Porcine) 2 Unit/ML in Sodium Chloride 0.9%: INTRAMUSCULAR | Qty: 500 | Status: AC

## 2016-05-17 MED FILL — Gentamicin Sulfate Inj 40 MG/ML: INTRAMUSCULAR | Qty: 2 | Status: AC

## 2016-05-17 MED FILL — Sodium Chloride Irrigation Soln 0.9%: Qty: 500 | Status: AC

## 2016-05-17 NOTE — Care Management Note (Signed)
Case Management Note  Patient Details  Name: Melvin [name] MRN: [id number] Date of Birth: [birth year]  Subjective/Objective:   Pt presented for Atrial Fib. Plan will be to [facility].                 Action/Plan: CSW assisting with disposition needs. No needs identified by CM at this time.   Expected Discharge Date:                  Expected Discharge Plan:  Skilled Nursing Facility  In-House Referral:  Clinical Social Work  Discharge planning Services  CM Consult  Post Acute Care Choice:  NA Choice offered to:  NA  DME Arranged:  N/A DME Agency:  NA  HH Arranged:  NA HH Agency:  NA  Status of Service:  Completed, signed off  If discussed at Long Length of Stay Meetings, dates discussed:    Additional Comments:  Gala LewandowskyGraves-Bigelow, Babara Buffalo Kaye, RN 05/17/2016, 11:46 AM

## 2016-05-17 NOTE — NC FL2 (Signed)
[place] MEDICAID FL2 LEVEL OF CARE SCREENING TOOL     IDENTIFICATION  Patient Name: Melvin [name] Birthdate: June 25, 1928 Sex: male Admission Date (Current Location): 05/16/2016  [facility] [place]   Facility and Address:  [facility] [place], [address], [place], [place] [address]      Provider Number: [id number]  Attending Physician Name and Address:  Laurey Moralealton S McLean, MD  Relative Name and Phone Number:       Current Level of Care: Hospital Recommended Level of Care: Skilled Nursing Facility Prior Approval Number:    Date Approved/Denied:   PASRR Number: [id number]  Discharge Plan: SNF    Current Diagnoses: Patient Active Problem List   Diagnosis Date Noted  . Atrial fibrillation (HCC) 05/16/2016  . Complete heart block (HCC) 05/11/2016  . S/P TAVR (transcatheter aortic valve replacement) 05/11/2016  . Severe aortic valve stenosis 04/30/2016  . Palpitations 06/17/2015  . Skin lesion 02/11/2015  . Unstable angina pectoris (HCC) 11/03/2014  . CAD in native artery 09/28/2014  . SOB (shortness of breath) 04/26/2014  . Tachycardia 04/26/2014  . Non-insulin dependent type 2 diabetes mellitus (HCC) 07/02/2013  . Osteoporosis, unspecified 06/24/2013  . Edema, lower extremity 06/22/2013  . LBBB (left bundle branch block) 12/01/2012  . Heart palpitations 12/01/2012  . Aortic stenosis 12/01/2012  . Unspecified hereditary and idiopathic peripheral neuropathy 10/01/2012  . RLS (restless legs syndrome) 10/01/2012  . Hypertension 11/26/2011  . High cholesterol 11/26/2011  . Liver lesion 11/26/2011    Orientation RESPIRATION BLADDER Height & Weight     Self, Time, Situation, Place    Continent Weight: 76.9 kg (169 lb 8 oz) Height:  5\' 6"  (167.6 cm)  BEHAVIORAL SYMPTOMS/MOOD NEUROLOGICAL BOWEL NUTRITION STATUS   (NONE)  (NONE) Continent    AMBULATORY STATUS COMMUNICATION OF NEEDS Skin   Limited Assist Verbally Surgical  wounds, Skin abrasions (Incision chest left)                       Personal Care Assistance Level of Assistance  Bathing, Feeding, Dressing Bathing Assistance: Limited assistance Feeding assistance: Independent Dressing Assistance: Limited assistance     Functional Limitations Info  Sight, Hearing, Speech Sight Info: Adequate Hearing Info: Adequate Speech Info: Adequate    SPECIAL CARE FACTORS FREQUENCY  PT (By licensed PT), OT (By licensed OT)     PT Frequency: 5/week OT Frequency: 5/week            Contractures Contractures Info: Present    Additional Factors Info  Code Status, Allergies, Insulin Sliding Scale Code Status Info: Full Code Allergies Info: Pramipexole   Insulin Sliding Scale Info: 3/day       Current Medications (05/17/2016):  This is the current hospital active medication list Current Facility-Administered Medications  Medication Dose Route Frequency Provider Last Rate Last Dose  . acetaminophen (TYLENOL) tablet 325-650 mg  325-650 mg Oral Q4H PRN Marinus MawGregg W Taylor, MD   650 mg at 05/16/16 2106  . amiodarone (PACERONE) tablet 200 mg  200 mg Oral Daily Sheilah Pigeonenee Lynn Ursuy, PA-C   200 mg at 05/17/16 0847  . amLODipine (NORVASC) tablet 10 mg  10 mg Oral Daily Laurey Moralealton S McLean, MD   10 mg at 05/17/16 0848  . aspirin EC tablet 81 mg  81 mg Oral Daily Laurey Moralealton S McLean, MD   81 mg at 05/17/16 0848  . atorvastatin (LIPITOR) tablet 40 mg  40 mg Oral q1800 Dalton S  Shirlee LatchMcLean, MD   40 mg at 05/16/16 1538  . clopidogrel (PLAVIX) tablet 75 mg  75 mg Oral Daily Laurey Moralealton S McLean, MD   75 mg at 05/17/16 0848  . gabapentin (NEURONTIN) capsule 300 mg  300 mg Oral QHS Laurey Moralealton S McLean, MD   300 mg at 05/16/16 2106  . insulin aspart (novoLOG) injection 0-15 Units  0-15 Units Subcutaneous TID WC Laurey Moralealton S McLean, MD   2 Units at 05/17/16 0848  . insulin aspart (novoLOG) injection 0-5 Units  0-5 Units Subcutaneous QHS Laurey Moralealton S McLean, MD   2 Units at 05/16/16 2106  . lisinopril  (PRINIVIL,ZESTRIL) tablet 10 mg  10 mg Oral Daily Laurey Moralealton S McLean, MD   10 mg at 05/17/16 0848  . metoprolol (LOPRESSOR) tablet 50 mg  50 mg Oral BID Sheilah Pigeonenee Lynn Ursuy, PA-C   50 mg at 05/17/16 0847  . nitroGLYCERIN (NITROSTAT) SL tablet 0.4 mg  0.4 mg Sublingual Q5 min PRN Laurey Moralealton S McLean, MD      . ondansetron [facility](ZOFRAN) injection 4 mg  4 mg Intravenous Q6H PRN Laurey Moralealton S McLean, MD      . potassium chloride SA (K-DUR,KLOR-CON) CR tablet 20 mEq  20 mEq Oral Daily Laurey Moralealton S McLean, MD   20 mEq at 05/17/16 0848  . torsemide (DEMADEX) tablet 40 mg  40 mg Oral BID Laurey Moralealton S McLean, MD   40 mg at 05/17/16 0848     Discharge Medications: Please see discharge summary for a list of discharge medications.  Relevant Imaging Results:  Relevant Lab Results:   Additional Information SSN: [id number], please follow range of motion recommendations in discharge summary.  Venita Lickampbell, Nychelle Cassata B, LCSW

## 2016-05-17 NOTE — Consult Note (Signed)
   [facility] CM In[facility] Consult   05/17/2016  Earle GellBenjamin W Digestive Health SpecialistsDominick 1927/07/16 [id number]    [facility]. Chart reviewed. Noted [facility]. No identifiable [facility] Car Management needs at this time.   Raiford NobleAtika Sherl Yzaguirre, MSN-Ed, RN,BSN [facility] Care Management Hospital Liaison [phone]

## 2016-05-17 NOTE — Clinical Social Work Note (Signed)
Per MD [facility] RN, patient/family, and facility notified of patient's DC. RN given number for report. DC packet on patient's chart. Ambulance transport requested for patient. CSW signing off at this time.   Roddie McBryant Thijs Brunton MSW, LipanLCSW, [place], [phone]

## 2016-05-17 NOTE — Clinical Social Work Note (Signed)
Clinical Social Work Assessment  Patient Details  Name: Melvin [name] MRN: [id number] Date of Birth: 08-16-1927  Date of referral:  05/17/16               Reason for consult:  Facility Placement, Discharge Planning                Permission sought to share information with:  Facility Medical sales representativeContact Representative, Family Supports Permission granted to share information::  Yes, Verbal Permission Granted  Name::        Agency::  SNFs  Relationship::     Contact Information:     Housing/Transportation Living arrangements for the past 2 months:  Single Family Home Source of Information:  Patient, Spouse, Adult Children Patient Interpreter Needed:  None Criminal Activity/Legal Involvement Pertinent to Current Situation/Hospitalization:  No - Comment as needed Significant Relationships:  Adult Children, Spouse Lives with:  Spouse Do you feel safe going back to the place where you live?  Yes Need for family participation in patient care:  No (Coment)  Care giving concerns:  Family plans for the [facility] to [facility] once [facility].   Social Worker assessment / plan: CSW spoke with patient's son to complete assessment. The patient [facility] with plans to return once medically stable. CSW will make arrangements with the facility.   Employment status:  Retired Health and safety inspectornsurance information:  Medicare PT Recommendations:  Not assessed at this time Information / Referral to community resources:  Skilled Nursing Facility  Patient/Family's Response to care:  The patient and family are happy with the care the patient is receiving. They appreciate CSW assistance.  Patient/Family's Understanding of and Emotional Response to Diagnosis, Current Treatment, and Prognosis:  The patient and family understand reason for readmission and hope that the patient will be able to stay out of the hospital.   Emotional Assessment Appearance:  Appears stated  age Attitude/Demeanor/Rapport:  Other (Patient appropriate and welcoming of CSW.) Affect (typically observed):  Accepting, Appropriate, Calm, Pleasant Orientation:  Oriented to Self, Oriented to Place, Oriented to  Time, Oriented to Situation Alcohol / Substance use:  Not Applicable Psych involvement (Current and /or in the community):  No (Comment)  Discharge Needs  Concerns to be addressed:  Discharge Planning Concerns Readmission within the last 30 days:  Yes Current discharge risk:  Chronically ill, Physical Impairment Barriers to Discharge:  Continued Medical Work up   Melvin [name], Melvin Lopezperez B, LCSW 05/17/2016, 8:36 AM

## 2016-05-20 ENCOUNTER — Non-Acute Institutional Stay (SKILLED_NURSING_FACILITY): Payer: Medicare Other | Admitting: Internal Medicine

## 2016-05-20 ENCOUNTER — Encounter: Payer: Self-pay | Admitting: Internal Medicine

## 2016-05-20 DIAGNOSIS — E119 Type 2 diabetes mellitus without complications: Secondary | ICD-10-CM | POA: Diagnosis not present

## 2016-05-20 DIAGNOSIS — Z950 Presence of cardiac pacemaker: Secondary | ICD-10-CM

## 2016-05-20 DIAGNOSIS — Z952 Presence of prosthetic heart valve: Secondary | ICD-10-CM | POA: Diagnosis not present

## 2016-05-20 DIAGNOSIS — I5042 Chronic combined systolic (congestive) and diastolic (congestive) heart failure: Secondary | ICD-10-CM | POA: Diagnosis not present

## 2016-05-20 DIAGNOSIS — I480 Paroxysmal atrial fibrillation: Secondary | ICD-10-CM | POA: Diagnosis not present

## 2016-05-20 NOTE — Progress Notes (Signed)
Provider:  Anjali,Gupta Location:   [facility] Room Number: 160/P Place of Service:  SNF (31)  PCP: [name], [name], MD Patient Care Team: John [name], MD as PCP - General (Family Medicine)  Extended Emergency Contact Information Primary Emergency Contact: Brindley,Sarah H Address: [address]          [place], [place] [address] [place] Home Phone: [phone] Mobile Phone: [phone] Relation: Spouse Secondary Emergency Contact: Taylor,Lisa  [place] Mobile Phone: [phone] Relation: Daughter  Code Status: Full Code Goals of Care: Advanced Directive information Advanced Directives 05/20/2016  Does patient have an advance directive? Yes  Type of Advance Directive (No Data)  Does patient want to make changes to advanced directive? No - Patient declined  Copy of advanced directive(s) in chart? Yes  Would patient like information on creating an advanced directive? -      Chief Complaint  Patient presents with  . New Admit To SNF    HPI: Patient is a 82 y.o. male seen today for admission to SNF for therapy. Patient Has extensive Cardiac history. He has history of coronary artery disease status post coronary artery bypass grafting in 1998, vein graft disease status post PCI and stenting, ischemic cardiomyopathy with chronic combined systolic and diastolic congestive heart failure with EF of 30-35% ,  hypertension, hyperlipidemia, type 2 diabetes mellitus, and peripheral vascular disease.  Patient underwent TAVR in 10/24. He tolerated procedure well and was discharged home. But he had Syncope on  11/04 and was found to have CHB requiring TV pacing. His Meds were held and he went into Sinus rhythm. He was discharged to [facility] for rehab. In the center patient woke up with Chest pain and was find to have WCT due to rapid Atrial fibrillation. He had Pacemaker implant on 05/16/16. Patient tolerated procedure well and  will be  started on Eliquis for his Paroxysmal Atrial fibrillation. His Plavix was discontinued. His Metoprolol and Amiodarone was resumed. Patient is doing well with therapy. He only wants to know if his torsemide can be reduced to his home dose and if he can get it little early so he can rest at night. He lives with his wife who is disable. He does want to go home eventually. He was driving and was independent before the surgery.  Past Medical History:  Diagnosis Date  . Age-related macular degeneration, dry, both eyes   . Arthritis    "just about all over my body"  . Chronic combined systolic and diastolic CHF (congestive heart failure) (HCC)    a. 11/2015 Echo: EF 30-35%, diff HK, Gr1 DD.  . Coronary artery disease    a. 1991 PTCA of LAD; b. 1998 CABG x 5 (LIMA->LAD, VG->RPDA->RPLA, VG->OM1, VG->D2; c. 09/2014 MV: lat ischemia, EF 37%; d. 10/2014 PCI: LAD 100, LCX 99ost/100p, RCA 100p, LIMA->LAD ok, VG->D2 ok, VG->OM1 95(3.0x15 Resolute Integrity DES), VG->PDA ok w/ 30% w/in VG->PL.  . GERD (gastroesophageal reflux disease)   . High cholesterol   . History of hiatal hernia   . Hypertension   . Ischemic cardiomyopathy    a. 11/2015 Echo: EF 30-35%, diff HK, Gr1 DD.  . LBBB (left bundle branch block)   . Moderate aortic stenosis    a. 11/2015 Echo: EF 30-35%, diff HK, Gr1 DD, mod AS, mild AI, mildly dil Ao root and Asc Ao, mild MR, mod dil LA, PASP <ME[facility]>[phone] 8[place]<[facility]UREMENT[facility]>8-[phone] 4[place]<MEASUREMENT[facility]>8 [id number]<MEASUREMENT[facility]>7-Ar([phone]<MEASUREMENT[facility]>1-Ar641-8[place]<MEASUREMENT[facility]>7-Ar352-8[place]<[facility]MENT[facility]>8-[phone] 6[place]<[facility]>8-Ar([phone]-[place] Palaurosis   . PAD (peripheral artery disease) (HCC)  a. s/p prior rotational atherectomy of L pop and tib trunk and PTA of focal popliteal lesions; b. 03/2015 ABI: R - 0.88, L 0.96, LCFA 50-79%, RSFA 50-79% distal stenosis, 3 vessel runoff.  . Palpitations    a. 06/2012 Event monitor: freq ventricular ectopy w/o VT.  Marland Kitchen PVC (premature ventricular contraction) 04/26/1997   on beta blocker/pt had cardio net monitor 06/23/2012  . RLS (restless legs syndrome) 10/01/2012  . Type II diabetes mellitus (HCC)   . Unspecified hereditary and  idiopathic peripheral neuropathy 10/01/2012   Past Surgical History:  Procedure Laterality Date  . APPENDECTOMY    . CARDIAC CATHETERIZATION  04/26/1997   CAD - 50-60% prox to mid LAD with 70% stenosis in mid-distal, 70-80% 1st diag stenosis, 50-60% ostial stenosis in large 2nd diagonal; diffuse RCA stenosis with 40-50% osital narrowing with 70% prox stenosis (Dr. Bishop Limbo) >> CABG  . CARDIAC CATHETERIZATION  11/01/2014  . CARDIAC CATHETERIZATION N/A 04/05/2016   Procedure: Right/Left Heart Cath and Coronary/Graft Angiography;  Surgeon: Lennette Bihari, MD;  Location: [facility] INVASIVE CV LAB;  Service: Cardiovascular;  Laterality: N/A;  . CARDIAC CATHETERIZATION N/A 05/11/2016   Procedure: Temporary Pacemaker;  Surgeon: Yates Decamp, MD;  Location: [facility] INVASIVE CV LAB;  Service: Cardiovascular;  Laterality: N/A;  . CATARACT EXTRACTION W/ INTRAOCULAR LENS  IMPLANT, BILATERAL Bilateral   . CORONARY ARTERY BYPASS GRAFT  04/1997   CABGx5 - LIMA to LAD, SVG to PDA, SVG to PLA, VG to OM1, VG to 2nd diagonal (Dr. Dorris Fetch)  . DOPPLER ECHOCARDIOGRAPHY  05/18/2012   EF 45-50%, LV systolic function mildly reduced; borderline LA enlargment; mild mitral annular calcif, mild-mod MR; mild TR with normal RVSP; mild calcification of AV leaflets and mild-mod valvular AS with mild regurg      . DOPPLER ECHOCARDIOGRAPHY  08/19/2011   EF 50-55%, mod conc LVH; mild mitral annular calcif with mild MR; mild-mod TR; AV mildly sclerotic with mild valvular AS and mild regurg, mild aortic root dilatation  . EP IMPLANTABLE DEVICE N/A 05/16/2016   Procedure: BiV Pacemaker Insertion CRT-P;  Surgeon: Marinus Maw, MD;  Location[facility] INVASIVE CV LAB;  Service: Cardiovascular;  Laterality: N/A;  . HERNIA REPAIR    . HIATAL HERNIA REPAIR    . INGUINAL HERNIA REPAIR Bilateral   . LEFT AND RIGHT HEART CATHETERIZATION WITH CORONARY ANGIOGRAM N/A 11/01/2014   Procedure: LEFT AND RIGHT HEART CATHETERIZATION WITH CORONARY ANGIOGRAM;  Surgeon:  Lennette Bihari, MD; EF35-40%, mild AS w/ 2+ AI, LAD, CFX, RCA 100%, LIMA-LAD OK, SVG-D1 OK, SVG-PDA 30%, SVG-OM 95%  . LOWER EXTREMITY ANGIOGRAM  02/05/2011   Diamondback orbital rotational atherectomy, percutaneous transluminal angioplasty of high-grade calcified popliteal & tibioperoneal stenosis (Dr. Erlene Quan)  . LOWER EXTREMITY ARTERIAL DOPPLER  02/2012   RLE - mild to mod arterial insuff; RSFA 50-69% diameter reduction; R pop 50-69% diameter reduction; posterior tibial (R) demonstrates occlusive disease; LSFA/prox pop 0-49% diameter reduction; L distal pop 50-69% diameter reduction; posterior tibial (L) demonstrates occlusive disease  . NM MYOCAR PERF EJECTION FRACTION  09/2011   lexiscan - no reversible ischmai, fixed anteroseptal defect r/t LBBB; EF 51%; septal hypokinesis; low risk but abnormal  . PERCUTANEOUS CORONARY STENT INTERVENTION (PCI-S) N/A 11/02/2014   Procedure: PERCUTANEOUS CORONARY STENT INTERVENTION (PCI-S);  Surgeon: Lennette Bihari, MD; 3.015 mm Resolute integrity DES to the SVG-OM   . TEE WITHOUT CARDIOVERSION N/A 04/30/2016   Procedure: TRANSESOPHAGEAL ECHOCARDIOGRAM (TEE);  Surgeon: Kathleene Hazel, MD;  Location: [facility]  OR;  Service: Open Heart Surgery;  Laterality: N/A;  . TONSILLECTOMY    . TRANSCATHETER AORTIC VALVE REPLACEMENT, TRANSFEMORAL N/A 04/30/2016   Procedure: TRANSCATHETER AORTIC VALVE REPLACEMENT, TRANSFEMORAL;  Surgeon: Kathleene Hazel, MD;  Location: [facility] OR;  Service: Open Heart Surgery;  Laterality: N/A;    reports that he has never smoked. He has never used smokeless tobacco. He reports that he does not drink alcohol or use drugs. Social History   Social History  . Marital status: Married    Spouse name: Maralyn Sago  . Number of children: 1  . Years of education: College   Occupational History  .  Retired   Social History Main Topics  . Smoking status: Never Smoker  . Smokeless tobacco: Never Used  . Alcohol use No  . Drug use: No  . Sexual  activity: No   Other Topics Concern  . Not on file   Social History Narrative   Pt lives at home with his spouse.   Has been married 33yrs.   Caffeine AVW:[id number], is right handed    Functional Status Survey:    Family History  Problem Relation Age of Onset  . CAD Mother   . Heart attack Father     Health Maintenance  Topic Date Due  . FOOT EXAM  01[birth year]/1940  . OPHTHALMOLOGY EXAM  01/25/1940  . TETANUS/TDAP  01/24/1949  . ZOSTAVAX  01/24/1990  . PNA vac Low Risk Adult (1 of 2 - PCV13) 01/25/1995  . INFLUENZA VACCINE  02/06/2016  . HEMOGLOBIN A1C  10/25/2016    Allergies  Allergen Reactions  . Other     Patient is allergic to something but doesn't know the name.  . Pramipexole Swelling      Medication List       Accurate as of 05/20/16 10:59 AM. Always use your most recent med list.          acetaminophen 325 MG tablet Commonly known as:  TYLENOL Take 325-650 mg by mouth every 6 (six) hours as needed for mild pain or moderate pain.   amiodarone 200 MG tablet Commonly known as:  PACERONE Take 1 tablet (200 mg total) by mouth daily.   amLODipine 10 MG tablet Commonly known as:  NORVASC Take 1 tablet (10 mg total) by mouth daily.   apixaban 5 MG Tabs tablet Commonly known as:  ELIQUIS Take 1 tablet (5 mg total) by mouth 2 (two) times daily.   aspirin 81 MG tablet Take 81 mg by mouth every evening.   atorvastatin 40 MG tablet Commonly known as:  LIPITOR TAKE 1 TABLET DAILY AT 6 P.M. (KEEP OFFICE VISIT)   cetirizine 10 MG tablet Commonly known as:  ZYRTEC Take 10 mg by mouth daily as needed for allergies.   gabapentin 300 MG capsule Commonly known as:  NEURONTIN Take 1 capsule (300 mg total) by mouth at bedtime.   hydrALAZINE 10 MG tablet Commonly known as:  APRESOLINE Take 2 tablets (20 mg total) by mouth every 8 (eight) hours.   lisinopril 10 MG tablet Commonly known as:  PRINIVIL,ZESTRIL Take 1 tablet (10 mg total) by mouth daily.     metformin 1000 MG (OSM) 24 hr tablet Commonly known as:  FORTAMET Take 1,000 mg by mouth 2 (two) times daily with a meal.   metoprolol 50 MG tablet Commonly known as:  LOPRESSOR Take 1 tablet (50 mg total) by mouth 2 (two) times daily.   multivitamin-iron-minerals-folic acid chewable tablet Chew 1 tablet by  mouth daily.   PRESERVISION AREDS 2 PO Take 1 capsule by mouth 2 (two) times daily.   niacin 750 MG CR tablet Commonly known as:  NIASPAN Take 2 tablets (1,500 mg total) by mouth at bedtime.   nitroGLYCERIN 0.4 MG SL tablet Commonly known as:  NITROSTAT Place 1 tablet (0.4 mg total) under the tongue every 5 (five) minutes as needed for chest pain.   oxymetazoline 0.05 % nasal spray Commonly known as:  AFRIN Place 2 sprays into both nostrils 3 (three) times daily as needed for congestion (Allergies).   polycarbophil 625 MG tablet Commonly known as:  FIBERCON Take 1,250 mg by mouth daily.   potassium chloride SA 20 MEQ tablet Commonly known as:  K-DUR,KLOR-CON Take 1 tablet (20 mEq total) by mouth daily.   RECLAST 5 MG/100ML Soln injection Generic drug:  zoledronic acid Inject 5 mg into the vein once.   torsemide 20 MG tablet Commonly known as:  DEMADEX Take 2 tablets (40 mg total) by mouth 2 (two) times daily.       Review of Systems  Constitutional: Positive for activity change, appetite change and unexpected weight change. Negative for chills, diaphoresis, fatigue and fever.       Has lost almost 15 lbs in past 2 months since surgery.  HENT: Negative.   Respiratory: Negative.  Negative for apnea, cough, choking, chest tightness, shortness of breath, wheezing and stridor.   Cardiovascular: Negative.  Negative for chest pain, palpitations and leg swelling.  Gastrointestinal: Negative.  Negative for abdominal distention, blood in stool, constipation, diarrhea and nausea.  Genitourinary: Positive for frequency. Negative for difficulty urinating, dysuria, flank  pain and hematuria.  Musculoskeletal: Negative.   Neurological: Positive for weakness. Negative for dizziness, facial asymmetry, light-headedness and headaches.  Psychiatric/Behavioral: Negative.  Negative for agitation, behavioral problems, confusion, decreased concentration and dysphoric mood.    Vitals:   05/20/16 1059  BP: 128/64  Pulse: 68  Resp: 18  Temp: 98.2 F (36.8 C)  TempSrc: Oral  SpO2: 94%   There is no height or weight on file to calculate BMI. Physical Exam  Constitutional: He is oriented to person, place, and time. He appears well-developed and well-nourished.  HENT:  Head: Normocephalic.  Mouth/Throat: Oropharynx is clear and moist.  Neck: Neck supple.  Cardiovascular: Normal rate and normal heart sounds.  An irregular rhythm present.  No murmur heard. Pulmonary/Chest: Effort normal and breath sounds normal. No respiratory distress. He has no wheezes. He has no rales. He exhibits no tenderness.  Abdominal: Soft. Bowel sounds are normal. He exhibits no distension and no mass. There is no tenderness. There is no rebound and no guarding.  Musculoskeletal: He exhibits no edema.  Neurological: He is alert and oriented to person, place, and time.  Had good strength in all extremities  Psychiatric: He has a normal mood and affect. His behavior is normal. Judgment and thought content normal.    Labs reviewed: Basic Metabolic Panel:  Recent Labs  96/04/54 0345  05/11/16 0440  05/13/16 0435 05/16/16 0444 05/17/16 0340  NA 139  < >  --   < > 136 138 140  K 3.8  < >  --   < > 4.3 3.8 4.2  CL 101  < >  --   < > 102 104 109  CO2 29  < >  --   < > 25 23 21*  GLUCOSE 96  < >  --   < > 146* 187* 134*  BUN 9  < >  --   < > 9 29* 23*  CREATININE 0.79  < > 1.12  < > 0.76 1.21 1.02  CALCIUM 8.7*  < >  --   < > 9.1 9.2 8.7*  MG 2.0  --  2.0  --   --   --   --   < > = values in this interval not displayed. Liver Function Tests:  Recent Labs  06/26/15 1123  04/26/16 1331  AST 20 25  ALT 15 19  ALKPHOS 59 53  BILITOT 0.5 0.6  PROT 7.3 6.4*  ALBUMIN 0.5* 4.1   No results for input(s): LIPASE, AMYLASE in the last 8760 hours. No results for input(s): AMMONIA in the last 8760 hours. CBC:  Recent Labs  03/27/16 1509  05/11/16 0140 05/11/16 0440 05/16/16 0444 05/17/16 0340  WBC 7.3  < > 10.5 11.4* 8.7 8.5  NEUTROABS 4,234  --  7.7  --   --   --   HGB 12.8*  < > 11.8* 10.8* 11.5* 10.8*  HCT 38.8  < > 34.6* 32.4* 35.0* 33.4*  MCV 93.3  < > 91.8 91.0 93.8 94.4  PLT 195  < > 192 170 215 188  < > = values in this interval not displayed. Cardiac Enzymes:  Recent Labs  05/16/16 0715 05/16/16 1319 05/16/16 1857  TROPONINI 0.18* 1.13* 1.36*   BNP: Invalid input(s): POCBNP Lab Results  Component Value Date   HGBA1C 7.1 (H) 04/26/2016   Lab Results  Component Value Date   TSH 1.102 05/16/2016   No results found for: VITAMINB12 No results found for: FOLATE No results found for: IRON, TIBC, FERRITIN  Imaging and Procedures obtained prior to SNF admission: Dg Chest 2 View  Result Date: 05/17/2016 CLINICAL DATA:  Pacemaker EXAM: CHEST  2 VIEW COMPARISON:  Yesterday FINDINGS: New biventricular ICD/ pacer. Leads overlap the right atrial appendage, right ventricle, and left ventricle. Status post transcatheter aortic valve replacement. The aorta is tortuous, with low and rightward positioning of CABG ostial markers. Chronic elevation of the left diaphragm. Pulmonary edema is resolved. No pneumothorax. IMPRESSION: No acute finding after biventricular pacer implant. Resolved pulmonary edema. Electronically Signed   By: Marnee SpringJonathon  Watts M.D.   On: 05/17/2016 08:01   Dg Chest Portable 1 View  Result Date: 05/16/2016 CLINICAL DATA:  Chest pain and shortness of breath this morning. EXAM: PORTABLE CHEST 1 VIEW COMPARISON:  05/11/2016 FINDINGS: Postoperative changes in the mediastinum. Shallow inspiration. Cardiac enlargement. No pulmonary  vascular congestion. Elevation of left hemidiaphragm with linear atelectasis in the lung bases. No focal consolidation. No blunting of costophrenic angles. No pneumothorax. Calcified and tortuous aorta. IMPRESSION: Cardiac enlargement without vascular congestion. Atelectasis in the lung bases with elevation of left hemidiaphragm. No change since prior study. Electronically Signed   By: Burman NievesWilliam  Stevens M.D.   On: 05/16/2016 05:14    Assessment/Plan  Paroxysmal atrial fibrillation   Patient was started on Eliquis.  He is also on Amiodarone and lopressor. Plavix was discontinued.   Status post biventricular pacemaker Patient has follow up with Dr Ladona Ridgelaylor. He is doing well.  S/P TAVR (transcatheter aortic valve replacement)  Patient says he is better with his SOB much improved.  Non-insulin dependent type 2 diabetes mellitus  Continue Accu checks. On metformin. Last A1C in 04/26/16 was 7.1  Chronic combined systolic and diastolic congestive heart failure   Patient is 163 lbs almost 15 lbs less then his baseline weight. Will continue  his torsemide at his home dose. recheck and follow BMP. Hyperlipidemia Continue on Atorvastatin and niacin.    Family/ staff Communication:   Labs/tests ordered:

## 2016-05-21 ENCOUNTER — Ambulatory Visit (INDEPENDENT_AMBULATORY_CARE_PROVIDER_SITE_OTHER): Payer: Medicare Other | Admitting: Physician Assistant

## 2016-05-21 ENCOUNTER — Encounter: Payer: Self-pay | Admitting: Physician Assistant

## 2016-05-21 VITALS — BP 122/50 | HR 62 | Ht 66.0 in | Wt 162.2 lb

## 2016-05-21 DIAGNOSIS — I5042 Chronic combined systolic (congestive) and diastolic (congestive) heart failure: Secondary | ICD-10-CM | POA: Diagnosis not present

## 2016-05-21 DIAGNOSIS — Z952 Presence of prosthetic heart valve: Secondary | ICD-10-CM

## 2016-05-21 DIAGNOSIS — I2510 Atherosclerotic heart disease of native coronary artery without angina pectoris: Secondary | ICD-10-CM

## 2016-05-21 DIAGNOSIS — I4819 Other persistent atrial fibrillation: Secondary | ICD-10-CM

## 2016-05-21 DIAGNOSIS — I10 Essential (primary) hypertension: Secondary | ICD-10-CM

## 2016-05-21 DIAGNOSIS — E7800 Pure hypercholesterolemia, unspecified: Secondary | ICD-10-CM

## 2016-05-21 DIAGNOSIS — I481 Persistent atrial fibrillation: Secondary | ICD-10-CM

## 2016-05-21 DIAGNOSIS — Z950 Presence of cardiac pacemaker: Secondary | ICD-10-CM | POA: Diagnosis not present

## 2016-05-21 NOTE — Progress Notes (Signed)
Cardiology Office Note:    Date:  05/21/2016   ID:  Melvin [name], DOB 17-Mar-1928, MRN [id number]  PCP:  Colette RibasGOLDING, [name], MD  Cardiologist:  Dr. Nicki Guadalajarahomas Kelly   Electrophysiologist:  Dr. Lewayne BuntingGregg Taylor  TAVR:  Dr. Verne Carrowhristopher McAlhany   Referring MD: Assunta FoundGolding, John, MD   Chief Complaint  Patient presents with  . Hospitalization Follow-up    s/p TAVR; AFib/tachy-brady; s/p pacemaker    History of Present Illness:    Melvin [name] is a 82 y.o. male with a hx of CAD s/p 5 V CABG in 1998 and PCI with DES to S-OM1 in 4/16, moderate AS, combined systolic and diastolic CHF 2/2 to ICM, HTN, HL, DM, PAD and LBBB, PVC's with palpitations. Echo in 5/17 with EF 30-35, mod LVH and mod AS with mean gradient 22 mmHg and AVA 1.1 cm .  He had symptoms several mos ago raising the concern for low output/normal gradient AS.  Dobutamine echo was c/w severe AS. Cardiac catheterization demonstrated patent bypass grafts. He was seen by Dr. Clifton JamesMcAlhany and Dr. Laneta SimmersBartle and set up for TAVR.  He was admitted 10/24-10/27 and underwent TAVR via R transfemoral approach with Dr. Laneta SimmersBartle and Dr. Clifton JamesMcAlhany.  Procedure was complicated by postoperative atrial fibrillation. Given his advanced age, there was concern for high risk of bleeding with anticoagulation. Therefore, he was continued on aspirin and Plavix with plans to start anticoagulation only if atrial fib recurred.  He was readmitted 11/4-11/7 with syncope in the setting of complete heart block.  He had temporary pacer placed.  His beta-blocker was DC'd and his AV conduction was quickly regained.  He was seen by Dr. Lewayne BuntingGregg Taylor for EP and ILR implant was discussed but the patient declined.  On tele, he remained in NSR with PVCs.  Patient was felt to be deconditioned and was DC to SNF.    He was readmitted 11/9-11/10 with recurrent AFib with RVR with associated L arm pain (anginal equivalent).  Given his LBBB/WCT with low BP, he was cardioverted in the ED and  placed on Amiodarone.  He was felt to have tachy-brady syndrome and decision was made to implant CRT-P by Dr. Ladona Ridgelaylor on 05/16/16.  He did have frequent PVCs and brief NSVT on tele.  He was felt to need anticoagulation for stroke prophylaxis and he was started on Apixaban and Clopidogrel was DC'd.    Returns for FU.  He is here alone.  He is currently at [facility].  He is feeling better.  He notes his breathing is improved. He denies palpitations.  He denies chest pain.  He denies orthopnea, PND or significant LE edema.  He has a mild non-productive cough.  He denies fevers.  He denies any bleeding issues.    CHADS2-VASc=6 (age > 6275, HTN, DM, vascular dz, CHF).    Prior CV studies that were reviewed today include:    Limited echo 05/01/16 Moderate concentric LVH, EF 45-50, grade 1 diastolic dysfunction, status post TAVR with normal function, mean gradient 10 mmHg, mild MR, trivial PI  Carotid US 04/15/16 R 1-39; L 40-59  LHC 04/05/16 LCx occluded at the origin LAD mid 100, proximal D2 90 RCA ostial 100 SVG-RPDA patent SVG-D2 patent SVG-OM1 patent with proximal 20 Free LIMA-LAD patent  Past Medical History:  Diagnosis Date  . Age-related macular degeneration, dry, both eyes   . Arthritis    "just about all over my body"  . Carotid artery disease (HCC)  a. Carotid US 10/17: R 1-39; L 40-59  . Chronic combined systolic and diastolic CHF (congestive heart failure) (HCC)    a. 11/2015 Echo: EF 30-35%, diff HK, Gr1 DD.  Marland Kitchen. Coronary artery disease    a. 1991 PTCA of LAD; b. 1998 CABG x 5 (LIMA->LAD, VG->RPDA->RPLA, VG->OM1, VG->D2; c. 09/2014 MV: lat ischemia, EF 37%; d. 10/2014 PCI: LAD 100, LCX 99ost/100p, RCA 100p, LIMA->LAD ok, VG->D2 ok, VG->OM1 95(3.0x15 Resolute Integrity DES), VG->PDA ok w/ 30% w/in VG->PL. // d. LHC 9/17: grafts patent, med Rx continued  . GERD (gastroesophageal reflux disease)   . High cholesterol   . History of hiatal hernia   . Hypertension   .  Ischemic cardiomyopathy    a. 11/2015 Echo: EF 30-35%, diff HK, Gr1 DD.  Marland Kitchen. LBBB (left bundle branch block)   . Osteoporosis   . PAD (peripheral artery disease) (HCC)    a. s/p prior rotational atherectomy of L pop and tib trunk and PTA of focal popliteal lesions; b. 03/2015 ABI: R - 0.88, L 0.96, LCFA 50-79%, RSFA 50-79% distal stenosis, 3 vessel runoff.  . Palpitations    a. 06/2012 Event monitor: freq ventricular ectopy w/o VT.  Marland Kitchen. Persistent atrial fibrillation (HCC)    a. CHADS2-VASc=6 // b. Eliquis started 11/17 // c. Amiodarone started 11/17  . PVC (premature ventricular contraction) 04/26/1997   on beta blocker/pt had cardio net monitor 06/23/2012  . RLS (restless legs syndrome) 10/01/2012  . Severe aortic stenosis    a. 11/2015 Echo: EF 30-35%, diff HK, Gr1 DD, mod AS, mild AI, mildly dil Ao root and Asc Ao, mild MR, mod dil LA, PASP 46mmHg. // b. Dob Echo 9/17 c/w severe AS // c. s/p TAVR 10/17 // d. Echo 05/01/16: Mod conc LVH, EF 45-50, Gr 1 DD, s/p TAVR with normal function, mean gradient 10 mmHg, mild MR, trivial PI  . Tachy-brady syndrome (HCC)    s/p CRT-P in 11/17  . Type II diabetes mellitus (HCC)   . Unspecified hereditary and idiopathic peripheral neuropathy 10/01/2012    Past Surgical History:  Procedure Laterality Date  . APPENDECTOMY    . CARDIAC CATHETERIZATION  04/26/1997   CAD - 50-60% prox to mid LAD with 70% stenosis in mid-distal, 70-80% 1st diag stenosis, 50-60% ostial stenosis in large 2nd diagonal; diffuse RCA stenosis with 40-50% osital narrowing with 70% prox stenosis (Dr. Bishop Limbo. Kelly) >> CABG  . CARDIAC CATHETERIZATION  11/01/2014  . CARDIAC CATHETERIZATION N/A 04/05/2016   Procedure: Right/Left Heart Cath and Coronary/Graft Angiography;  Surgeon: Lennette Biharihomas A Kelly, MD;  [facility];  Service: Cardiovascular;  Laterality: N/A;  . CARDIAC CATHETERIZATION N/A 05/11/2016   Procedure: Temporary Pacemaker;  Surgeon: Yates DecampJay Ganji, MD;  Location: [facility] INVASIVE CV  LAB;  Service: Cardiovascular;  Laterality: N/A;  . CATARACT EXTRACTION W/ INTRAOCULAR LENS  IMPLANT, BILATERAL Bilateral   . CORONARY ARTERY BYPASS GRAFT  04/1997   CABGx5 - LIMA to LAD, SVG to PDA, SVG to PLA, VG to OM1, VG to 2nd diagonal (Dr. Dorris FetchHendrickson)  . DOPPLER ECHOCARDIOGRAPHY  05/18/2012   EF 45-50%, LV systolic function mildly reduced; borderline LA enlargment; mild mitral annular calcif, mild-mod MR; mild TR with normal RVSP; mild calcification of AV leaflets and mild-mod valvular AS with mild regurg      . DOPPLER ECHOCARDIOGRAPHY  08/19/2011   EF 50-55%, mod conc LVH; mild mitral annular calcif with mild MR; mild-mod TR; AV mildly sclerotic with mild valvular AS and mild  regurg, mild aortic root dilatation  . EP IMPLANTABLE DEVICE N/A 05/16/2016   Procedure: BiV Pacemaker Insertion CRT-P;  Surgeon: Marinus Maw, MD;  Location: [facility] INVASIVE CV LAB;  Service: Cardiovascular;  Laterality: N/A;  . HERNIA REPAIR    . HIATAL HERNIA REPAIR    . INGUINAL HERNIA REPAIR Bilateral   . LEFT AND RIGHT HEART CATHETERIZATION WITH CORONARY ANGIOGRAM N/A 11/01/2014   Procedure: LEFT AND RIGHT HEART CATHETERIZATION WITH CORONARY ANGIOGRAM;  Surgeon: Lennette Bihari, MD; EF35-40%, mild AS w/ 2+ AI, LAD, CFX, RCA 100%, LIMA-LAD OK, SVG-D1 OK, SVG-PDA 30%, SVG-OM 95%  . LOWER EXTREMITY ANGIOGRAM  02/05/2011   Diamondback orbital rotational atherectomy, percutaneous transluminal angioplasty of high-grade calcified popliteal & tibioperoneal stenosis (Dr. Erlene Quan)  . LOWER EXTREMITY ARTERIAL DOPPLER  02/2012   RLE - mild to mod arterial insuff; RSFA 50-69% diameter reduction; R pop 50-69% diameter reduction; posterior tibial (R) demonstrates occlusive disease; LSFA/prox pop 0-49% diameter reduction; L distal pop 50-69% diameter reduction; posterior tibial (L) demonstrates occlusive disease  . NM MYOCAR PERF EJECTION FRACTION  09/2011   lexiscan - no reversible ischmai, fixed anteroseptal defect r/t LBBB; EF  51%; septal hypokinesis; low risk but abnormal  . PERCUTANEOUS CORONARY STENT INTERVENTION (PCI-S) N/A 11/02/2014   Procedure: PERCUTANEOUS CORONARY STENT INTERVENTION (PCI-S);  Surgeon: Lennette Bihari, MD; 3.015 mm Resolute integrity DES to the SVG-OM   . TEE WITHOUT CARDIOVERSION N/A 04/30/2016   Procedure: TRANSESOPHAGEAL ECHOCARDIOGRAM (TEE);  Surgeon: Kathleene Hazel, MD;  Location[facility] OR;  Service: Open Heart Surgery;  Laterality: N/A;  . TONSILLECTOMY    . TRANSCATHETER AORTIC VALVE REPLACEMENT, TRANSFEMORAL N/A 04/30/2016   Procedure: TRANSCATHETER AORTIC VALVE REPLACEMENT, TRANSFEMORAL;  Surgeon: Kathleene Hazel, MD;  Location: [facility] OR;  Service: Open Heart Surgery;  Laterality: N/A;    Current Medications: Current Meds  Medication Sig  . acetaminophen (TYLENOL) 325 MG tablet Take 325-650 mg by mouth every 6 (six) hours as needed for mild pain or moderate pain.  Marland Kitchen amiodarone (PACERONE) 200 MG tablet Take 1 tablet (200 mg total) by mouth daily.  Marland Kitchen amLODipine (NORVASC) 10 MG tablet Take 1 tablet (10 mg total) by mouth daily.  Marland Kitchen apixaban (ELIQUIS) 5 MG TABS tablet Take 1 tablet (5 mg total) by mouth 2 (two) times daily.  Marland Kitchen aspirin 81 MG tablet Take 81 mg by mouth every evening.   Marland Kitchen atorvastatin (LIPITOR) 40 MG tablet TAKE 1 TABLET DAILY AT 6 P.M. (KEEP OFFICE VISIT)  . cetirizine (ZYRTEC) 10 MG tablet Take 10 mg by mouth daily as needed for allergies.   Marland Kitchen gabapentin (NEURONTIN) 300 MG capsule Take 1 capsule (300 mg total) by mouth at bedtime.  . hydrALAZINE (APRESOLINE) 10 MG tablet Take 2 tablets (20 mg total) by mouth every 8 (eight) hours.  Marland Kitchen lisinopril (PRINIVIL,ZESTRIL) 10 MG tablet Take 1 tablet (10 mg total) by mouth daily.  . metformin (FORTAMET) 1000 MG (OSM) 24 hr tablet Take 1,000 mg by mouth 2 (two) times daily with a meal.  . metoprolol (LOPRESSOR) 50 MG tablet Take 1 tablet (50 mg total) by mouth 2 (two) times daily.  . Multiple Vitamins-Minerals (PRESERVISION  AREDS 2 PO) Take 1 capsule by mouth 2 (two) times daily.   . multivitamin-iron-minerals-folic acid (CENTRUM) chewable tablet Chew 1 tablet by mouth daily.  . niacin (NIASPAN) 750 MG CR tablet Take 2 tablets (1,500 mg total) by mouth at bedtime.  . nitroGLYCERIN (NITROSTAT) 0.4 MG SL tablet Place 1  tablet (0.4 mg total) under the tongue every 5 (five) minutes as needed for chest pain.  Marland Kitchen oxymetazoline (AFRIN) 0.05 % nasal spray Place 2 sprays into both nostrils 3 (three) times daily as needed for congestion (Allergies).  . polycarbophil (FIBERCON) 625 MG tablet Take 1,250 mg by mouth daily.   . potassium chloride SA (K-DUR,KLOR-CON) 20 MEQ tablet Take 1 tablet (20 mEq total) by mouth daily.  Marland Kitchen torsemide (DEMADEX) 20 MG tablet Take 2 tablets (40 mg total) by mouth 2 (two) times daily.     Allergies:   Other and Pramipexole   Social History   Social History  . Marital status: Married    Spouse name: Melvin [name]  . Number of children: 1  . Years of education: College   Occupational History  .  Retired   Social History Main Topics  . Smoking status: Never Smoker  . Smokeless tobacco: Never Used  . Alcohol use No  . Drug use: No  . Sexual activity: No   Other Topics Concern  . None   Social History Narrative   Pt lives at home with his spouse.   Has been married 44yrs.   Caffeine ZOX:WRUEAV, is right handed     Family History:  The patient's family history includes CAD in his mother; Heart attack in his father.   ROS:   Please see the history of present illness.    ROS All other systems reviewed and are negative.   EKGs/Labs/Other Test Reviewed:    EKG:  EKG is  ordered today.  The ekg ordered today demonstrates AV paced HR 62  Recent Labs: 04/26/2016: ALT 19 05/11/2016: Magnesium 2.0 05/16/2016: B Natriuretic Peptide 151.5; TSH 1.102 05/17/2016: BUN 23; Creatinine, Ser 1.02; Hemoglobin 10.8; Platelets 188; Potassium 4.2; Sodium 140   Recent Lipid Panel    Component Value  Date/Time   CHOL 135 09/28/2014 0933   TRIG 77 09/28/2014 0933   HDL 48 09/28/2014 0933   LDLCALC 72 09/28/2014 0933     Physical Exam:    VS:  BP (!) 122/50   Pulse 62   Ht 5\' 6"  (1.676 m)   Wt 162 lb 3.2 oz (73.6 kg)   BMI 26.18 kg/m     Wt Readings from Last 3 Encounters:  05/21/16 162 lb 3.2 oz (73.6 kg)  05/17/16 169 lb 8 oz (76.9 kg)  05/14/16 166 lb 4.8 oz (75.4 kg)     Physical Exam  Constitutional: He is oriented to person, place, and time. He appears well-developed and well-nourished. No distress.  HENT:  Head: Normocephalic.  Iodoform dressing on scalp  Eyes: No scleral icterus.  Neck: No JVD present.  Cardiovascular: Normal rate, regular rhythm, S1 normal and S2 normal.   Murmur heard.  Low-pitched systolic murmur is present with a grade of 2/6  at the upper left sternal border Pulmonary/Chest: Effort normal. He has no wheezes. He has no rales.  Pacer site with steri-strips in place; no hematoma; no erythema or d/c  Abdominal: Soft. There is no tenderness.  Musculoskeletal: He exhibits edema.  Trace-1+ bilat LE edema; R FA site without mass or drainage  Neurological: He is alert and oriented to person, place, and time.  Skin: Skin is warm and dry.  Psychiatric: He has a normal mood and affect.    ASSESSMENT:    1. S/P TAVR (transcatheter aortic valve replacement)   2. Status post biventricular pacemaker   3. Chronic combined systolic and diastolic CHF (congestive heart failure) (  HCC)   4. Coronary artery disease involving native coronary artery of native heart without angina pectoris   5. Persistent atrial fibrillation (HCC)   6. Essential hypertension   7. Pure hypercholesterolemia    PLAN:    In order of problems listed above:  1. S/p TAVR - He is s/p TAVR for symptomatic severe AS.  Echo 10/25 with normally functioning prosthesis.    -  FU with TAVR clinic as planned next week with repeat Echo.    -  Continue SBE prophylaxis.    -  He is now  on ASA + Apixaban given Persistent AF (Clopidogrel was DC'd).    2. Tachy-Brady syndrome s/p PPM - He is s/p CRT-P.  FU with EP as planned. Device clinic alerted of patient's appointment today but he needs to keep FU next week. Patient was advised at DC 11/10 no driving until cleared by Dr. Ladona Ridgel at wound check next week.  3. Chronic combined systolic and diastolic CHF - Volume appears stable.  Continue beta-blocker, ACE inhibitor, hydralazine.  4. CAD s/p CABG - LHC in 9/17 with patent bypass grafts.  No Angina.  Continue ASA, statin, beta-blocker.  5. Persistent AF - He has had recurrent AF since TAVR and required DCCV last week after presenting in RVR.  He is now on Amiodarone and Apixaban.  He is maintaining NSR.    -  Arrange FU BMET, CBC LFTs, TSH in 6 weeks.   6. HTN - BP is controlled.  7. HL - Continue statin.   Medication Adjustments/Labs and Tests Ordered: Current medicines are reviewed at length with the patient today.  Concerns regarding medicines are outlined above.  Medication changes, Labs and Tests ordered today are outlined in the Patient Instructions noted below. Patient Instructions  Medication Instructions:  Your physician recommends that you continue on your current medications as directed. Please refer to the Current Medication list given to you today.  Labwork: 6 WEEKS BMET, CBC, TSH, LFT TO BE DONE THE SAME DAY YOU SEE DR. KELLY   Testing/Procedures: NONE  Follow-Up: 07/03/16 @ 10:30 WITH DR. Landry Dyke PA, HAO MENG, PAC   Any Other Special Instructions Will Be Listed Below (If Applicable)  If you need a refill on your cardiac medications before your next appointment, please call your pharmacy.  Signed, Tereso Newcomer, PA-C  05/21/2016 12:11 PM    [facility] [address] [place], [place], [place]  [address] Phone: [phone]; Fax: [phone]

## 2016-05-21 NOTE — Patient Instructions (Addendum)
Medication Instructions:  Your physician recommends that you continue on your current medications as directed. Please refer to the Current Medication list given to you today.  Labwork: 6 WEEKS BMET, CBC, TSH, LFT TO BE DONE THE SAME DAY YOU SEE DR. KELLY   Testing/Procedures: NONE  Follow-Up: 07/03/16 @ 10:30 WITH DR. Landry DykeKELLY'S PA, HAO MENG, PAC   Any Other Special Instructions Will Be Listed Below (If Applicable)  If you need a refill on your cardiac medications before your next appointment, please call your pharmacy.

## 2016-05-27 ENCOUNTER — Encounter (HOSPITAL_COMMUNITY)
Admission: RE | Admit: 2016-05-27 | Discharge: 2016-05-27 | Disposition: A | Discharge status: Home / Self Care | Payer: Medicare Other | Source: Skilled Nursing Facility | Attending: Internal Medicine | Admitting: Internal Medicine

## 2016-05-27 DIAGNOSIS — M810 Age-related osteoporosis without current pathological fracture: Secondary | ICD-10-CM | POA: Insufficient documentation

## 2016-05-27 DIAGNOSIS — I4891 Unspecified atrial fibrillation: Secondary | ICD-10-CM | POA: Insufficient documentation

## 2016-05-27 DIAGNOSIS — Z48812 Encounter for surgical aftercare following surgery on the circulatory system: Secondary | ICD-10-CM | POA: Insufficient documentation

## 2016-05-27 LAB — BASIC METABOLIC PANEL
Anion gap: 9 (ref 5–15)
BUN: 31 mg/dL — ABNORMAL HIGH (ref 6–20)
CHLORIDE: 102 mmol/L (ref 101–111)
CO2: 30 mmol/L (ref 22–32)
CREATININE: 1.14 mg/dL (ref 0.61–1.24)
Calcium: 9.7 mg/dL (ref 8.9–10.3)
GFR calc non Af Amer: 56 mL/min — ABNORMAL LOW (ref >60)
Glucose, Bld: 135 mg/dL — ABNORMAL HIGH (ref 65–99)
POTASSIUM: 3.6 mmol/L (ref 3.5–5.1)
SODIUM: 141 mmol/L (ref 135–145)

## 2016-05-27 LAB — CBC
HCT: 38.8 % — ABNORMAL LOW (ref 39.0–52.0)
HEMOGLOBIN: 12.4 g/dL — AB (ref 13.0–17.0)
MCH: 31 pg (ref 26.0–34.0)
MCHC: 32 g/dL (ref 30.0–36.0)
MCV: 97 fL (ref 78.0–100.0)
Platelets: 170 10*3/uL (ref 150–400)
RBC: 4 MIL/uL — AB (ref 4.22–5.81)
RDW: 14.5 % (ref 11.5–15.5)
WBC: 8.3 10*3/uL (ref 4.0–10.5)

## 2016-05-29 ENCOUNTER — Ambulatory Visit (HOSPITAL_COMMUNITY): Payer: No Typology Code available for payment source | Attending: Cardiology

## 2016-05-29 ENCOUNTER — Encounter (INDEPENDENT_AMBULATORY_CARE_PROVIDER_SITE_OTHER): Payer: Self-pay

## 2016-05-29 ENCOUNTER — Ambulatory Visit (INDEPENDENT_AMBULATORY_CARE_PROVIDER_SITE_OTHER): Payer: Medicare Other | Admitting: *Deleted

## 2016-05-29 ENCOUNTER — Encounter: Payer: Self-pay | Admitting: Cardiovascular Disease

## 2016-05-29 ENCOUNTER — Other Ambulatory Visit: Payer: Self-pay

## 2016-05-29 ENCOUNTER — Ambulatory Visit (INDEPENDENT_AMBULATORY_CARE_PROVIDER_SITE_OTHER): Payer: Medicare Other | Admitting: Cardiovascular Disease

## 2016-05-29 VITALS — BP 146/58 | HR 56 | Ht 66.0 in | Wt 161.0 lb

## 2016-05-29 DIAGNOSIS — I447 Left bundle-branch block, unspecified: Secondary | ICD-10-CM

## 2016-05-29 DIAGNOSIS — I2510 Atherosclerotic heart disease of native coronary artery without angina pectoris: Secondary | ICD-10-CM

## 2016-05-29 DIAGNOSIS — I5022 Chronic systolic (congestive) heart failure: Secondary | ICD-10-CM

## 2016-05-29 DIAGNOSIS — Z952 Presence of prosthetic heart valve: Secondary | ICD-10-CM | POA: Diagnosis not present

## 2016-05-29 DIAGNOSIS — I480 Paroxysmal atrial fibrillation: Secondary | ICD-10-CM | POA: Insufficient documentation

## 2016-05-29 DIAGNOSIS — I10 Essential (primary) hypertension: Secondary | ICD-10-CM | POA: Insufficient documentation

## 2016-05-29 DIAGNOSIS — E119 Type 2 diabetes mellitus without complications: Secondary | ICD-10-CM | POA: Insufficient documentation

## 2016-05-29 DIAGNOSIS — I071 Rheumatic tricuspid insufficiency: Secondary | ICD-10-CM | POA: Insufficient documentation

## 2016-05-29 DIAGNOSIS — I350 Nonrheumatic aortic (valve) stenosis: Secondary | ICD-10-CM

## 2016-05-29 DIAGNOSIS — Z950 Presence of cardiac pacemaker: Secondary | ICD-10-CM

## 2016-05-29 DIAGNOSIS — I255 Ischemic cardiomyopathy: Secondary | ICD-10-CM | POA: Insufficient documentation

## 2016-05-29 DIAGNOSIS — E785 Hyperlipidemia, unspecified: Secondary | ICD-10-CM | POA: Insufficient documentation

## 2016-05-29 LAB — CUP PACEART INCLINIC DEVICE CHECK
Battery Remaining Longevity: 120 mo
Battery Voltage: 3.15 V
Brady Statistic RA Percent Paced: 35.55 %
Brady Statistic RV Percent Paced: 69.35 %
Implantable Lead Implant Date: 20171109
Implantable Lead Implant Date: 20171109
Implantable Lead Location: 753859
Implantable Lead Model: 5076
Implantable Pulse Generator Implant Date: 20171109
Lead Channel Impedance Value: 342 Ohm
Lead Channel Impedance Value: 456 Ohm
Lead Channel Impedance Value: 494 Ohm
Lead Channel Impedance Value: 532 Ohm
Lead Channel Impedance Value: 760 Ohm
Lead Channel Pacing Threshold Amplitude: 0.5 V
Lead Channel Pacing Threshold Pulse Width: 0.4 ms
Lead Channel Sensing Intrinsic Amplitude: 8.25 mV
Lead Channel Setting Pacing Amplitude: 3.5 V
Lead Channel Setting Pacing Pulse Width: 0.4 ms
MDC IDC LEAD IMPLANT DT: 20171109
MDC IDC LEAD LOCATION: 753858
MDC IDC LEAD LOCATION: 753860
MDC IDC MSMT LEADCHNL LV IMPEDANCE VALUE: 513 Ohm
MDC IDC MSMT LEADCHNL LV IMPEDANCE VALUE: 646 Ohm
MDC IDC MSMT LEADCHNL LV PACING THRESHOLD AMPLITUDE: 1 V
MDC IDC MSMT LEADCHNL LV PACING THRESHOLD PULSEWIDTH: 0.4 ms
MDC IDC MSMT LEADCHNL RA PACING THRESHOLD AMPLITUDE: 1.5 V
MDC IDC MSMT LEADCHNL RA SENSING INTR AMPL: 1.625 mV
MDC IDC MSMT LEADCHNL RV IMPEDANCE VALUE: 475 Ohm
MDC IDC MSMT LEADCHNL RV IMPEDANCE VALUE: 608 Ohm
MDC IDC MSMT LEADCHNL RV PACING THRESHOLD PULSEWIDTH: 0.4 ms
MDC IDC SESS DTM: 20171122091013
MDC IDC SET LEADCHNL LV PACING AMPLITUDE: 2.5 V
MDC IDC SET LEADCHNL RV PACING AMPLITUDE: 3.5 V
MDC IDC SET LEADCHNL RV PACING PULSEWIDTH: 0.4 ms
MDC IDC SET LEADCHNL RV SENSING SENSITIVITY: 1.2 mV
MDC IDC STAT BRADY AP VP PERCENT: 30.19 %
MDC IDC STAT BRADY AP VS PERCENT: 0.56 %
MDC IDC STAT BRADY AS VP PERCENT: 60.28 %
MDC IDC STAT BRADY AS VS PERCENT: 8.96 %

## 2016-05-29 NOTE — Progress Notes (Signed)
Wound check appointment. Steri-strips removed. Wound without redness or edema. Incision edges approximated, wound well healed. Normal device function. Thresholds, sensing, and impedances consistent with implant measurements. Device programmed at 3.5V with auto capture programmed on for extra safety margin until 3 month visit. Histogram distribution appropriate for patient and level of activity. BiV pacing 90.5% (90.3% effective). 43 Vs episodes--1:1 per markers, atrial rates 113-150bpm, longest 1min 52sec. No mode switches or high ventricular rates noted. Patient educated about wound care, arm mobility, lifting restrictions. ROV with GT on 08/16/16.

## 2016-05-29 NOTE — Progress Notes (Signed)
Chief Complaint  Patient presents with  . Coronary Artery Disease  . Atrial Fibrillation  . Aortic Stenosis    History of Present Illness: 82 yo male with history of severe aortic stenosis, HTN, HLD, DM, CAD s/p CABG and prior PCI, ischemic cardiomyopathy, diabetes, PAD with prior lower extremity interventions, LBBB and PVCS who is here today for one month TAVR follow up. His other cardiac issues are followed by Dr Tresa Endo. He was admitted to [facility] 04/30/16 and underwent TAVR from the right transfemoral approach. His procedure was complicated by post op atrial fib. He was readmitted 05/11/16 with syncope in the setting of complete heart block. Beta blocker was held. He was seen by EP but refused a loop recorder. He was in sinus at time of discharge. Readmitted 05/16/16 with atrial fib withi RVR. Pacemaker was implanted by Dr. Ladona Ridgel. He was started on Eliquis and Plavix was stopped.   He is here today for follow up. He is feeling much better. No chest pain or SOB. His energy level is improving.   Primary Care Physician: Colette Ribas, MD   Past Medical History:  Diagnosis Date  . Age-related macular degeneration, dry, both eyes   . Arthritis    "just about all over my body"  . Carotid artery disease (HCC)    a. Carotid US 10/17: R 1-39; L 40-59  . Chronic combined systolic and diastolic CHF (congestive heart failure) (HCC)    a. 11/2015 Echo: EF 30-35%, diff HK, Gr1 DD.  Marland Kitchen Coronary artery disease    a. 1991 PTCA of LAD; b. 1998 CABG x 5 (LIMA->LAD, VG->RPDA->RPLA, VG->OM1, VG->D2; c. 09/2014 MV: lat ischemia, EF 37%; d. 10/2014 PCI: LAD 100, LCX 99ost/100p, RCA 100p, LIMA->LAD ok, VG->D2 ok, VG->OM1 95(3.0x15 Resolute Integrity DES), VG->PDA ok w/ 30% w/in VG->PL. // d. LHC 9/17: grafts patent, med Rx continued  . GERD (gastroesophageal reflux disease)   . High cholesterol   . History of hiatal hernia   . Hypertension   . Ischemic cardiomyopathy    a. 11/2015 Echo: EF  30-35%, diff HK, Gr1 DD.  Marland Kitchen LBBB (left bundle branch block)   . Osteoporosis   . PAD (peripheral artery disease) (HCC)    a. s/p prior rotational atherectomy of L pop and tib trunk and PTA of focal popliteal lesions; b. 03/2015 ABI: R - 0.88, L 0.96, LCFA 50-79%, RSFA 50-79% distal stenosis, 3 vessel runoff.  . Palpitations    a. 06/2012 Event monitor: freq ventricular ectopy w/o VT.  Marland Kitchen Persistent atrial fibrillation (HCC)    a. CHADS2-VASc=6 // b. Eliquis started 11/17 // c. Amiodarone started 11/17  . PVC (premature ventricular contraction) 04/26/1997   on beta blocker/pt had cardio net monitor 06/23/2012  . RLS (restless legs syndrome) 10/01/2012  . Severe aortic stenosis    a. 11/2015 Echo: EF 30-35%, diff HK, Gr1 DD, mod AS, mild AI, mildly dil Ao root and Asc Ao, mild MR, mod dil LA, PASP . // b. Dob Echo 9/17 c/w severe AS // c. s/p TAVR 10/17 // d. Echo 05/01/16: Mod conc LVH, EF 45-50, Gr 1 DD, s/p TAVR with normal function, mean gradient 10 mmHg, mild MR, trivial PI  . Tachy-brady syndrome (HCC)    s/p CRT-P in 11/17  . Type II diabetes mellitus (HCC)   . Unspecified hereditary and idiopathic peripheral neuropathy 10/01/2012    Past Surgical History:  Procedure Laterality Date  . APPENDECTOMY    . CARDIAC CATHETERIZATION  04/26/1997   CAD - 50-60% prox to mid LAD with 70% stenosis in mid-distal, 70-80% 1st diag stenosis, 50-60% ostial stenosis in large 2nd diagonal; diffuse RCA stenosis with 40-50% osital narrowing with 70% prox stenosis (Dr. Bishop Limbo. Kelly) >> CABG  . CARDIAC CATHETERIZATION  11/01/2014  . CARDIAC CATHETERIZATION N/A 04/05/2016   Procedure: Right/Left Heart Cath and Coronary/Graft Angiography;  Surgeon: Lennette Biharihomas A Kelly, MD;  [facility];  Service: Cardiovascular;  Laterality: N/A;  . CARDIAC CATHETERIZATION N/A 05/11/2016   Procedure: Temporary Pacemaker;  Surgeon: Yates DecampJay Ganji, MD;  Location: [facility] INV[facility] CV LAB;  Service: Cardiovascular;  Laterality:  N/A;  . CATARACT EXTRACTION W/ INTRAOCULAR LENS  IMPLANT, BILATERAL Bilateral   . CORONARY ARTERY BYPASS GRAFT  04/1997   CABGx5 - LIMA to LAD, SVG to PDA, SVG to PLA, VG to OM1, VG to 2nd diagonal (Dr. Dorris FetchHendrickson)  . DOPPLER ECHOCARDIOGRAPHY  05/18/2012   EF 45-50%, LV systolic function mildly reduced; borderline LA enlargment; mild mitral annular calcif, mild-mod MR; mild TR with normal RVSP; mild calcification of AV leaflets and mild-mod valvular AS with mild regurg      . DOPPLER ECHOCARDIOGRAPHY  08/19/2011   EF 50-55%, mod conc LVH; mild mitral annular calcif with mild MR; mild-mod TR; AV mildly sclerotic with mild valvular AS and mild regurg, mild aortic root dilatation  . EP IMPLANTABLE DEVICE N/A 05/16/2016   Procedure: BiV Pacemaker Insertion CRT-P;  Surgeon: Marinus MawGregg W Taylor, MD;  [facility];  Service: Cardiovascular;  Laterality: N/A;  . HERNIA REPAIR    . HIATAL HERNIA REPAIR    . INGUINAL HERNIA REPAIR Bilateral   . LEFT AND RIGHT HEART CATHETERIZATION WITH CORONARY ANGIOGRAM N/A 11/01/2014   Procedure: LEFT AND RIGHT HEART CATHETERIZATION WITH CORONARY ANGIOGRAM;  Surgeon: Lennette Biharihomas A Kelly, MD; EF35-40%, mild AS w/ 2+ AI, LAD, CFX, RCA 100%, LIMA-LAD OK, SVG-D1 OK, SVG-PDA 30%, SVG-OM 95%  . LOWER EXTREMITY ANGIOGRAM  02/05/2011   Diamondback orbital rotational atherectomy, percutaneous transluminal angioplasty of high-grade calcified popliteal & tibioperoneal stenosis (Dr. Erlene QuanJ. Berry)  . LOWER EXTREMITY ARTERIAL DOPPLER  02/2012   RLE - mild to mod arterial insuff; RSFA 50-69% diameter reduction; R pop 50-69% diameter reduction; posterior tibial (R) demonstrates occlusive disease; LSFA/prox pop 0-49% diameter reduction; L distal pop 50-69% diameter reduction; posterior tibial (L) demonstrates occlusive disease  . NM MYOCAR PERF EJECTION FRACTION  09/2011   lexiscan - no reversible ischmai, fixed anteroseptal defect r/t LBBB; EF 51%; septal hypokinesis; low risk but abnormal   . PERCUTANEOUS CORONARY STENT INTERVENTION (PCI-S) N/A 11/02/2014   Procedure: PERCUTANEOUS CORONARY STENT INTERVENTION (PCI-S);  Surgeon: Lennette Biharihomas A Kelly, MD; 3.015 mm Resolute integrity DES to the SVG-OM   . TEE WITHOUT CARDIOVERSION N/A 04/30/2016   Procedure: TRANSESOPHAGEAL ECHOCARDIOGRAM (TEE);  Surgeon: Kathleene Hazelhristopher D McAlhany, MD;  [facility];  Service: Open Heart Surgery;  Laterality: N/A;  . TONSILLECTOMY    . TRANSCATHETER AORTIC VALVE REPLACEMENT, TRANSFEMORAL N/A 04/30/2016   Procedure: TRANSCATHETER AORTIC VALVE REPLACEMENT, TRANSFEMORAL;  Surgeon: Kathleene Hazelhristopher D McAlhany, MD;  [facility];  Service: Open Heart Surgery;  Laterality: N/A;    Current Outpatient Prescriptions  Medication Sig Dispense Refill  . acetaminophen (TYLENOL) 325 MG tablet Take 325-650 mg by mouth every 6 (six) hours as needed for mild pain or moderate pain.    Marland Kitchen. amiodarone (PACERONE) 200 MG tablet Take 1 tablet (200 mg total) by mouth daily. 30 tablet 3  . amLODipine (NORVASC)  10 MG tablet Take 1 tablet (10 mg total) by mouth daily. 30 tablet 12  . apixaban (ELIQUIS) 5 MG TABS tablet Take 1 tablet (5 mg total) by mouth 2 (two) times daily. 60 tablet 3  . aspirin 81 MG tablet Take 81 mg by mouth every evening.     Marland Kitchen. atorvastatin (LIPITOR) 40 MG tablet TAKE 1 TABLET DAILY AT 6 P.M. (KEEP OFFICE VISIT) 90 tablet 2  . cetirizine (ZYRTEC) 10 MG tablet Take 10 mg by mouth daily as needed for allergies.     Marland Kitchen. gabapentin (NEURONTIN) 300 MG capsule Take 1 capsule (300 mg total) by mouth at bedtime. 90 capsule 3  . hydrALAZINE (APRESOLINE) 10 MG tablet Take 2 tablets (20 mg total) by mouth every 8 (eight) hours. 270 tablet 1  . lisinopril (PRINIVIL,ZESTRIL) 10 MG tablet Take 1 tablet (10 mg total) by mouth daily. 30 tablet 3  . metformin (FORTAMET) 1000 MG (OSM) 24 hr tablet Take 1,000 mg by mouth 2 (two) times daily with a meal.    . metoprolol (LOPRESSOR) 50 MG tablet Take 1 tablet (50 mg total) by mouth 2  (two) times daily. 60 tablet 3  . Multiple Vitamins-Minerals (PRESERVISION AREDS 2 PO) Take 1 capsule by mouth 2 (two) times daily.     . multivitamin-iron-minerals-folic acid (CENTRUM) chewable tablet Chew 1 tablet by mouth daily.    . niacin (NIASPAN) 750 MG CR tablet Take 2 tablets (1,500 mg total) by mouth at bedtime. 180 tablet 1  . nitroGLYCERIN (NITROSTAT) 0.4 MG SL tablet Place 1 tablet (0.4 mg total) under the tongue every 5 (five) minutes as needed for chest pain. 25 tablet 3  . oxymetazoline (AFRIN) 0.05 % nasal spray Place 2 sprays into both nostrils 3 (three) times daily as needed for congestion (Allergies).    . polycarbophil (FIBERCON) 625 MG tablet Take 1,250 mg by mouth daily.     . potassium chloride SA (K-DUR,KLOR-CON) 20 MEQ tablet Take 1 tablet (20 mEq total) by mouth daily. 30 tablet 12  . torsemide (DEMADEX) 20 MG tablet Take 2 tablets (40 mg total) by mouth 2 (two) times daily. 120 tablet 12   No current facility-administered medications for this visit.     Allergies  Allergen Reactions  . Other     Patient is allergic to something but doesn't know the name.  . Pramipexole Swelling    Social History   Social History  . Marital status: Married    Spouse name: Maralyn SagoSarah  . Number of children: 1  . Years of education: College   Occupational History  .  Retired   Social History Main Topics  . Smoking status: Never Smoker  . Smokeless tobacco: Never Used  . Alcohol use No  . Drug use: No  . Sexual activity: No   Other Topics Concern  . Not on file   Social History Narrative   Pt lives at home with his spouse.   Has been married 5619yrs.   Caffeine ZHY:QMVHQIse:rarely, is right handed    Family History  Problem Relation Age of Onset  . CAD Mother   . Heart attack Father     Review of Systems:  As stated in the HPI and otherwise negative.   BP (!) 146/58   Pulse (!) 56   Ht 5\' 6"  (1.676 m)   Wt 161 lb (73 kg)   BMI 25.99 kg/m   Physical  Examination: General: Elderly white male in NAD.  HEENT: OP clear,  mucus membranes moist  SKIN: warm, dry. No rashes. Neuro: No focal deficits  Musculoskeletal: Muscle strength 5/5 all ext  Psychiatric: Mood and affect normal  Neck: No JVD, no carotid bruits, no thyromegaly, no lymphadenopathy.  Lungs:Clear bilaterally, no wheezes, rhonci, crackles Cardiovascular: Regular rate and rhythm. Harsh systolic murmur noted. No gallops or rubs. Abdomen:Soft. Bowel sounds present. Non-tender.  Extremities: No lower extremity edema. Pulses are 1 + in the bilateral PT.  Echo 05/29/16: - Left ventricle: The cavity size was normal. Wall thickness was   increased in a pattern of mild LVH. Systolic function was mildly   reduced. The estimated ejection fraction was in the range of 45%   to 50%. There is hypokinesis of the mid-apical anteroseptal   myocardium, dyssynergy (paced rhyhtm). Doppler parameters are   consistent with abnormal left ventricular relaxation (grade 1   diastolic dysfunction). - Ventricular septum: Septal motion showed abnormal function and   dyssynergy. - Aortic valve: A bioprosthesis was present, TAVR, well seated,   normal transvalvular velocity. Peak velocity (S): 246 cm/s. - Mitral valve: Calcified annulus. Mildly thickened leaflets . - Left atrium: The atrium was mildly dilated. - Right ventricle: The cavity size was mildly dilated. Wall   thickness was normal. Pacer wire or catheter noted in right   ventricle. - Right atrium: The atrium was mildly dilated. - Tricuspid valve: There was trivial regurgitation.  Impressions:  - Compared to the prior study, there has been no significant   interval change.  EKG:  EKG is not ordered today.   Recent Labs: 04/26/2016: ALT 19 05/11/2016: Magnesium 2.0 05/16/2016: B Natriuretic Peptide 151.5; TSH 1.102 05/27/2016: BUN 31; Creatinine, Ser 1.14; Hemoglobin 12.4; Platelets 170; Potassium 3.6; Sodium 141   Lipid Panel     Component Value Date/Time   CHOL 135 09/28/2014 0933   TRIG 77 09/28/2014 0933   HDL 48 09/28/2014 0933   LDLCALC 72 09/28/2014 0933     Wt Readings from Last 3 Encounters:  05/29/16 161 lb (73 kg)  05/21/16 162 lb 3.2 oz (73.6 kg)  05/17/16 169 lb 8 oz (76.9 kg)     Other studies Reviewed: Additional studies/ records that were reviewed today include: . Review of the above records demonstrates:   Assessment and Plan:   1. Severe aortic valve stenosis: He is now s/p TAVR. He is here for one month follow up. He is feeling well. NYHA class 2. Echo today shows mild LV systolic dysfunction (unchanged) with normally functioning bioprosthetic aortic valve without leak. I will see him back in one year for TAVR follow up with echo.   2. Atrial fibrillation, paroxysmal: He appears to be in sinus today. He is on metoprolol and amiodarone. Pacemaker in place. This is followed by Dr. Ladona Ridgel. He will see Dr. Ladona Ridgel in February.   Current medicines are reviewed at length with the patient today.  The patient does not have concerns regarding medicines.  The following changes have been made:  no change  Labs/ tests ordered today include:   No orders of the defined types were placed in this encounter. right and left heart cath   Disposition:   FU with me in one year. Follow up as planned with Dr. Ladona Ridgel. His long term f/u is with Dr. Tresa Endo.    Signed, Verne Carrow, MD 05/29/2016 1:41 PM    [facility] [address] [place], [place], [place]  [address] Phone: [phone]; Fax: [phone]

## 2016-05-29 NOTE — Patient Instructions (Signed)
Medication Instructions:  Your physician recommends that you continue on your current medications as directed. Please refer to the Current Medication list given to you today.   Labwork: none  Testing/Procedures: Your physician has requested that you have an echocardiogram. Echocardiography is a painless test that uses sound waves to create images of your heart. It provides your doctor with information about the size and shape of your heart and how well your heart's chambers and valves are working. This procedure takes approximately one hour. There are no restrictions for this procedure. To be done in 11 months on day of appointment with Dr. Clifton JamesMcAlhany    Follow-Up: Your physician recommends that you schedule a follow-up appointment in: about 11 months.     Any Other Special Instructions Will Be Listed Below (If Applicable).     If you need a refill on your cardiac medications before your next appointment, please call your pharmacy.

## 2016-06-03 NOTE — Telephone Encounter (Signed)
I am aware of the hospitalizations and device implant

## 2016-06-04 ENCOUNTER — Encounter (HOSPITAL_COMMUNITY)
Admission: AD | Admit: 2016-06-04 | Discharge: 2016-06-04 | Disposition: A | Discharge status: Home / Self Care | Payer: Medicare Other | Source: Skilled Nursing Facility | Attending: Internal Medicine | Admitting: Internal Medicine

## 2016-06-04 DIAGNOSIS — K5900 Constipation, unspecified: Secondary | ICD-10-CM | POA: Insufficient documentation

## 2016-06-04 DIAGNOSIS — M6281 Muscle weakness (generalized): Secondary | ICD-10-CM | POA: Insufficient documentation

## 2016-06-04 DIAGNOSIS — I4891 Unspecified atrial fibrillation: Secondary | ICD-10-CM | POA: Insufficient documentation

## 2016-06-04 DIAGNOSIS — Z48812 Encounter for surgical aftercare following surgery on the circulatory system: Secondary | ICD-10-CM | POA: Insufficient documentation

## 2016-06-04 DIAGNOSIS — I10 Essential (primary) hypertension: Secondary | ICD-10-CM | POA: Insufficient documentation

## 2016-06-04 LAB — BASIC METABOLIC PANEL
Anion gap: 11 (ref 5–15)
BUN: 40 mg/dL — ABNORMAL HIGH (ref 6–20)
CHLORIDE: 100 mmol/L — AB (ref 101–111)
CO2: 29 mmol/L (ref 22–32)
CREATININE: 1.11 mg/dL (ref 0.61–1.24)
Calcium: 10 mg/dL (ref 8.9–10.3)
GFR calc non Af Amer: 58 mL/min — ABNORMAL LOW (ref >60)
Glucose, Bld: 144 mg/dL — ABNORMAL HIGH (ref 65–99)
Potassium: 3.8 mmol/L (ref 3.5–5.1)
SODIUM: 140 mmol/L (ref 135–145)

## 2016-06-07 ENCOUNTER — Non-Acute Institutional Stay (SKILLED_NURSING_FACILITY): Payer: Medicare Other | Admitting: Internal Medicine

## 2016-06-07 ENCOUNTER — Encounter: Payer: Self-pay | Admitting: Internal Medicine

## 2016-06-07 DIAGNOSIS — I5042 Chronic combined systolic (congestive) and diastolic (congestive) heart failure: Secondary | ICD-10-CM

## 2016-06-07 DIAGNOSIS — R799 Abnormal finding of blood chemistry, unspecified: Secondary | ICD-10-CM | POA: Diagnosis not present

## 2016-06-07 DIAGNOSIS — Z952 Presence of prosthetic heart valve: Secondary | ICD-10-CM

## 2016-06-07 NOTE — Progress Notes (Signed)
Location:   [facility] Room Number: 160/P Place of Service:  SNF (31) Provider:  Georgeanna Lea, MD  Patient Care Team: Assunta Found, MD as PCP - General [facility])  Extended Emergency Contact Information Primary Emergency Contact: Tammi Sou Address: [address]          [place], [place] [address] [place] of [place] Home Phone: [phone] Mobile Phone: [phone] Relation: Spouse Secondary Emergency Contact: Rennis Harding [place] of [place] Mobile Phone: [phone] Relation: Daughter  Code Status:  Full Code Goals of care: Advanced Directive information Advanced Directives 06/07/2016  Does Patient Have a Medical Advance Directive? Yes  Type of Advance Directive (No Data)  Does patient want to make changes to medical advance directive? No - Patient declined  Copy of Healthcare Power of Attorney in Chart? -  Would patient like information on creating a medical advance directive? -     Chief Complaint  Patient presents with  . Acute Visit    Elevated BUN   Follow-up CHF HPI:  Pt is a 82 y.o. male seen today for an acute visit for lab that shows his BUN slowly rising- He does have a history of combined systolic and diastolic CHF and is on torsemide currently 40 mg in the morning 20 mg at night.  Most recent lab on November 28 his creatinine was 1.11 which is relatively baseline BUN is 40 which appears to be rising previously was 31 and before that 23  His weight appears to be relatively stable at 161 pounds.  He feels his edema is improved he is not complaining of shortness of breath except with exertion at therapy which she says is not new.  He feels he is doing well in this regards. He is status post transcatheter aortic valve replacement and has done well with this. As well  \He is status post pacemaker as well.  He does have a history of atrial fibrillation and is on  Eliquist as well as  amiodarone and Lopressor this appears to be rate controlled pulse is actually 59 on exam today.         Past Medical History:  Diagnosis Date  . Age-related macular degeneration, dry, both eyes   . Arthritis    "just about all over my body"  . Carotid artery disease (HCC)    a. Carotid US 10/17: R 1-39; L 40-59  . Chronic combined systolic and diastolic CHF (congestive heart failure) (HCC)    a. 11/2015 Echo: EF 30-35%, diff HK, Gr1 DD.  Marland Kitchen Coronary artery disease    a. 1991 PTCA of LAD; b. 1998 CABG x 5 (LIMA->LAD, VG->RPDA->RPLA, VG->OM1, VG->D2; c. 09/2014 MV: lat ischemia, EF 37%; d. 10/2014 PCI: LAD 100, LCX 99ost/100p, RCA 100p, LIMA->LAD ok, VG->D2 ok, VG->OM1 95(3.0x15 Resolute Integrity DES), VG->PDA ok w/ 30% w/in VG->PL. // d. LHC 9/17: grafts patent, med Rx continued  . GERD (gastroesophageal reflux disease)   . High cholesterol   . History of hiatal hernia   . Hypertension   . Ischemic cardiomyopathy    a. 11/2015 Echo: EF 30-35%, diff HK, Gr1 DD.  Marland Kitchen LBBB (left bundle branch block)   . Osteoporosis   . PAD (peripheral artery disease) (HCC)    a. s/p prior rotational atherectomy of L pop and tib trunk and PTA of focal popliteal lesions; b. 03/2015 ABI: R - 0.88, L 0.96, LCFA 50-79%, RSFA 50-79% distal stenosis, 3 vessel runoff.  . Palpitations  a. 06/2012 Event monitor: freq ventricular ectopy w/o VT.  Marland Kitchen Persistent atrial fibrillation (HCC)    a. CHADS2-VASc=6 // b. Eliquis started 11/17 // c. Amiodarone started 11/17  . PVC (premature ventricular contraction) 04/26/1997   on beta blocker/pt had cardio net monitor 06/23/2012  . RLS (restless legs syndrome) 10/01/2012  . Severe aortic stenosis    a. 11/2015 Echo: EF 30-35%, diff HK, Gr1 DD, mod AS, mild AI, mildly dil Ao root and Asc Ao, mild MR, mod dil LA, PASP . // b. Dob Echo 9/17 c/w severe AS // c. s/p TAVR 10/17 // d. Echo 05/01/16: Mod conc LVH, EF 45-50, Gr 1 DD, s/p TAVR with normal function, mean gradient  10 mmHg, mild MR, trivial PI  . Tachy-brady syndrome (HCC)    s/p CRT-P in 11/17  . Type II diabetes mellitus (HCC)   . Unspecified hereditary and idiopathic peripheral neuropathy 10/01/2012   Past Surgical History:  Procedure Laterality Date  . APPENDECTOMY    . CARDIAC CATHETERIZATION  04/26/1997   CAD - 50-60% prox to mid LAD with 70% stenosis in mid-distal, 70-80% 1st diag stenosis, 50-60% ostial stenosis in large 2nd diagonal; diffuse RCA stenosis with 40-50% osital narrowing with 70% prox stenosis (Dr. Bishop Limbo) >> CABG  . CARDIAC CATHETERIZATION  11/01/2014  . CARDIAC CATHETERIZATION N/A 04/05/2016   Procedure: Right/Left Heart Cath and Coronary/Graft Angiography;  Surgeon: Lennette Bihari, MD;  Location: [facility] INVASIVE CV LAB;  Service: Cardiovascular;  Laterality: N/A;  . CARDIAC CATHETERIZATION N/A 05/11/2016   Procedure: Temporary Pacemaker;  Surgeon: Yates Decamp, MD;  Location: [facility] INVASIVE CV LAB;  Service: Cardiovascular;  Laterality: N/A;  . CATARACT EXTRACTION W/ INTRAOCULAR LENS  IMPLANT, BILATERAL Bilateral   . CORONARY ARTERY BYPASS GRAFT  04/1997   CABGx5 - LIMA to LAD, SVG to PDA, SVG to PLA, VG to OM1, VG to 2nd diagonal (Dr. Dorris Fetch)  . DOPPLER ECHOCARDIOGRAPHY  05/18/2012   EF 45-50%, LV systolic function mildly reduced; borderline LA enlargment; mild mitral annular calcif, mild-mod MR; mild TR with normal RVSP; mild calcification of AV leaflets and mild-mod valvular AS with mild regurg      . DOPPLER ECHOCARDIOGRAPHY  08/19/2011   EF 50-55%, mod conc LVH; mild mitral annular calcif with mild MR; mild-mod TR; AV mildly sclerotic with mild valvular AS and mild regurg, mild aortic root dilatation  . EP IMPLANTABLE DEVICE N/A 05/16/2016   Procedure: BiV Pacemaker Insertion CRT-P;  Surgeon: Marinus Maw, MD;  Location: [facility] INVASIVE CV LAB;  Service: Cardiovascular;  Laterality: N/A;  . HERNIA REPAIR    . HIATAL HERNIA REPAIR    . INGUINAL HERNIA REPAIR Bilateral   . LEFT AND  RIGHT HEART CATHETERIZATION WITH CORONARY ANGIOGRAM N/A 11/01/2014   Procedure: LEFT AND RIGHT HEART CATHETERIZATION WITH CORONARY ANGIOGRAM;  Surgeon: Lennette Bihari, MD; EF35-40%, mild AS w/ 2+ AI, LAD, CFX, RCA 100%, LIMA-LAD OK, SVG-D1 OK, SVG-PDA 30%, SVG-OM 95%  . LOWER EXTREMITY ANGIOGRAM  02/05/2011   Diamondback orbital rotational atherectomy, percutaneous transluminal angioplasty of high-grade calcified popliteal & tibioperoneal stenosis (Dr. Erlene Quan)  . LOWER EXTREMITY ARTERIAL DOPPLER  02/2012   RLE - mild to mod arterial insuff; RSFA 50-69% diameter reduction; R pop 50-69% diameter reduction; posterior tibial (R) demonstrates occlusive disease; LSFA/prox pop 0-49% diameter reduction; L distal pop 50-69% diameter reduction; posterior tibial (L) demonstrates occlusive disease  . NM MYOCAR PERF EJECTION FRACTION  09/2011   lexiscan - no reversible ischmai, fixed  anteroseptal defect r/t LBBB; EF 51%; septal hypokinesis; low risk but abnormal  . PERCUTANEOUS CORONARY STENT INTERVENTION (PCI-S) N/A 11/02/2014   Procedure: PERCUTANEOUS CORONARY STENT INTERVENTION (PCI-S);  Surgeon: Lennette Biharihomas A Kelly, MD; 3.015 mm Resolute integrity DES to the SVG-OM   . TEE WITHOUT CARDIOVERSION N/A 04/30/2016   Procedure: TRANSESOPHAGEAL ECHOCARDIOGRAM (TEE);  Surgeon: Kathleene Hazelhristopher D McAlhany, MD;  [facility];  Service: Open Heart Surgery;  Laterality: N/A;  . TONSILLECTOMY    . TRANSCATHETER AORTIC VALVE REPLACEMENT, TRANSFEMORAL N/A 04/30/2016   Procedure: TRANSCATHETER AORTIC VALVE REPLACEMENT, TRANSFEMORAL;  Surgeon: Kathleene Hazelhristopher D McAlhany, MD;  [facility];  Service: Open Heart Surgery;  Laterality: N/A;    Allergies  Allergen Reactions  . Other     Patient is allergic to something but doesn't know the name.  . Pramipexole Swelling      Medication List       Accurate as of 06/07/16  2:22 PM. Always use your most recent med list.          acetaminophen 325 MG tablet Commonly known as:   TYLENOL Take 325-650 mg by mouth every 6 (six) hours as needed for mild pain or moderate pain.   amiodarone 200 MG tablet Commonly known as:  PACERONE Take 1 tablet (200 mg total) by mouth daily.   amLODipine 10 MG tablet Commonly known as:  NORVASC Take 1 tablet (10 mg total) by mouth daily.   apixaban 5 MG Tabs tablet Commonly known as:  ELIQUIS Take 1 tablet (5 mg total) by mouth 2 (two) times daily.   aspirin 81 MG tablet Take 81 mg by mouth every evening.   atorvastatin 40 MG tablet Commonly known as:  LIPITOR TAKE 1 TABLET DAILY AT 6 P.M. (KEEP OFFICE VISIT)   cetirizine 10 MG tablet Commonly known as:  ZYRTEC Take 10 mg by mouth daily as needed for allergies.   gabapentin 300 MG capsule Commonly known as:  NEURONTIN Take 1 capsule (300 mg total) by mouth at bedtime.   hydrALAZINE 10 MG tablet Commonly known as:  APRESOLINE Take 2 tablets (20 mg total) by mouth every 8 (eight) hours.   lisinopril 10 MG tablet Commonly known as:  PRINIVIL,ZESTRIL Take 1 tablet (10 mg total) by mouth daily.   metformin 1000 MG (OSM) 24 hr tablet Commonly known as:  FORTAMET Take 1,000 mg by mouth 2 (two) times daily with a meal.   metoprolol 50 MG tablet Commonly known as:  LOPRESSOR Take 1 tablet (50 mg total) by mouth 2 (two) times daily.   multivitamin-iron-minerals-folic acid chewable tablet Chew 1 tablet by mouth daily.   PRESERVISION AREDS 2 PO Take 1 capsule by mouth 2 (two) times daily.   niacin 750 MG CR tablet Commonly known as:  NIASPAN Take 2 tablets (1,500 mg total) by mouth at bedtime.   nitroGLYCERIN 0.4 MG SL tablet Commonly known as:  NITROSTAT Place 1 tablet (0.4 mg total) under the tongue every 5 (five) minutes as needed for chest pain.   oxymetazoline 0.05 % nasal spray Commonly known as:  AFRIN Place 2 sprays into both nostrils 3 (three) times daily as needed for congestion (Allergies).   polycarbophil 625 MG tablet Commonly known as:   FIBERCON Take 1,250 mg by mouth daily.   potassium chloride SA 20 MEQ tablet Commonly known as:  K-DUR,KLOR-CON Take 1 tablet (20 mEq total) by mouth daily.   torsemide 20 MG tablet Commonly known as:  DEMADEX Give 1 tablet by mouth  once day. Give 2 tablets by mouth once a day       Review of Systems Constitutional:  Says he feels well weight appears to be stable Negative for chills, diaphoresis, fatigue and fever.       Has lost almost 15 lbs in past 2 months since surgery.  HENT: Negative.   Respiratory: Negative.  Negative for apnea, cough, choking, chest tightness, shortness of breath, says he gets a little short-winded in therapy but this is not new when expected with the exertion no wheezing and stridor.   Cardiovascular: Negative.  Negative for chest pain, palpitations  edema he says is stable Gastrointestinal: Negative.  Negative for abdominal distention, blood in stool, constipation, diarrhea and nausea.  Genitourinary: Positive for frequency. Negative for difficulty urinating, dysuria, flank pain and hematuria.  Musculoskeletal: Negative.   Neurological: Positive for weakness. Negative for dizziness, facial asymmetry, light-headedness and headaches.  Psychiatric/Behavioral: Negative.  Negative for agitation, behavioral problems, confusion, decreased concentration and dysphoric mood.   Immunization History  Administered Date(s) Administered  . Influenza Whole 04/07/2013  . Influenza,inj,Quad PF,36+ Mos 04/25/2014  . Influenza-Unspecified 04/20/2015   Pertinent  Health Maintenance Due  Topic Date Due  . FOOT EXAM  01/25/1938  . OPHTHALMOLOGY EXAM  01/25/1938  . INFLUENZA VACCINE  06/07/2017 (Originally 02/06/2016)  . PNA vac Low Risk Adult (1 of 2 - PCV13) 06/07/2017 (Originally 01/25/1995)  . HEMOGLOBIN A1C  10/25/2016   Fall Risk  10/26/2015  Falls in the past year? No   Functional Status Survey:    Vitals:   06/07/16 1407  BP: (!) 142/65  Pulse: 60  Resp:  18  Temp: 98.6 F (37 C)  TempSrc: Oral  Weight is 161 pounds  Physical Exam Constitutional: He is oriented to person, place, and time. He appears well-developed and well-nourished. --Sitting comfortably in his wheelchair HENT:  Head: Normocephalic.  Mouth/Throat: Oropharynx is clear and moist.  Neck: Neck supple.  Cardiovascular: Normal rate and normal heart sounds.  An irregular rhythm present.  No murmur heard. Pulmonary/Chest: Effort normal and breath sounds normal. No respiratory distress. He has no wheezes. He has no rales. He exhibits no tenderness.  Abdominal: Soft. Bowel sounds are normal. He exhibits no distension and no mass. There is no tenderness. There is no rebound and no guarding.  Musculoskeletal: He exhibits mild pedal edema -- more on the right versus left he says this is not new there appears to be some mild edema above his sock line but he says this is actually stable to improved  Neurological: He is alert and oriented to person, place, and time.  Had good strength in all extremities  Psychiatric: He has a normal mood and affect. His behavior is normal. Judgment and thought content normal.   Labs reviewed:  Recent Labs  05/01/16 0345  05/11/16 0440  05/17/16 0340 05/27/16 0811 06/04/16 0615  NA 139  < >  --   < > 140 141 140  K 3.8  < >  --   < > 4.2 3.6 3.8  CL 101  < >  --   < > 109 102 100*  CO2 29  < >  --   < > 21* 30 29  GLUCOSE 96  < >  --   < > 134* 135* 144*  BUN 9  < >  --   < > 23* 31* 40*  CREATININE 0.79  < > 1.12  < > 1.02 1.14 1.11  CALCIUM 8.7*  < >  --   < >  8.7* 9.7 10.0  MG 2.0  --  2.0  --   --   --   --   < > = values in this interval not displayed.  Recent Labs  06/26/15 1123 04/26/16 1331  AST 20 25  ALT 15 19  ALKPHOS 59 53  BILITOT 0.5 0.6  PROT 7.3 6.4*  ALBUMIN 0.5* 4.1    Recent Labs  03/27/16 1509  05/11/16 0140  05/16/16 0444 05/17/16 0340 05/27/16 0811  WBC 7.3  < > 10.5  < > 8.7 8.5 8.3  NEUTROABS 4,234   --  7.7  --   --   --   --   HGB 12.8*  < > 11.8*  < > 11.5* 10.8* 12.4*  HCT 38.8  < > 34.6*  < > 35.0* 33.4* 38.8*  MCV 93.3  < > 91.8  < > 93.8 94.4 97.0  PLT 195  < > 192  < > 215 188 170  < > = values in this interval not displayed. Lab Results  Component Value Date   TSH 1.102 05/16/2016   Lab Results  Component Value Date   HGBA1C 7.1 (H) 04/26/2016   Lab Results  Component Value Date   CHOL 135 09/28/2014   HDL 48 09/28/2014   LDLCALC 72 09/28/2014   TRIG 77 09/28/2014    Significant Diagnostic Results in last 30 days:  Dg Chest 2 View  Result Date: 05/17/2016 CLINICAL DATA:  Pacemaker EXAM: CHEST  2 VIEW COMPARISON:  Yesterday FINDINGS: New biventricular ICD/ pacer. Leads overlap the right atrial appendage, right ventricle, and left ventricle. Status post transcatheter aortic valve replacement. The aorta is tortuous, with low and rightward positioning of CABG ostial markers. Chronic elevation of the left diaphragm. Pulmonary edema is resolved. No pneumothorax. IMPRESSION: No acute finding after biventricular pacer implant. Resolved pulmonary edema. Electronically Signed   By: Marnee Spring M.D.   On: 05/17/2016 08:01   Ct Head Wo Contrast  Result Date: 05/11/2016 CLINICAL DATA:  Acute onset of bradycardia and syncope. Status post fall face first onto floor. Hit forehead and nose. Initial encounter. EXAM: CT HEAD WITHOUT CONTRAST CT MAXILLOFACIAL WITHOUT CONTRAST TECHNIQUE: Multidetector CT imaging of the head and maxillofacial structures were performed using the standard protocol without intravenous contrast. Multiplanar CT image reconstructions of the maxillofacial structures were also generated. COMPARISON:  None. FINDINGS: CT HEAD FINDINGS Brain: No evidence of acute infarction, hemorrhage, hydrocephalus, extra-axial collection or mass lesion/mass effect. Prominence of the ventricles and sulci reflects mild to moderate cortical volume loss. Cerebellar atrophy is noted.  Mild periventricular white matter change likely reflects small vessel ischemic microangiopathy. The brainstem and fourth ventricle are within normal limits. The basal ganglia are unremarkable in appearance. The cerebral hemispheres demonstrate grossly normal gray-white differentiation. No mass effect or midline shift is seen. Vascular: No hyperdense vessel or unexpected calcification. Skull: There is no evidence of fracture; visualized osseous structures are unremarkable in appearance. Other: No significant soft tissue abnormalities are seen. CT MAXILLOFACIAL FINDINGS Osseous: There is no evidence of fracture or dislocation. The maxilla and mandible appear intact. The nasal bone is unremarkable in appearance. The visualized dentition demonstrates no acute abnormality. Degenerative flattening is noted at the right temporomandibular joint. Orbits: The orbits are intact bilaterally. Sinuses: The visualized paranasal sinuses and mastoid air cells are well-aerated. Soft tissues: No significant soft tissue abnormalities are seen. The parapharyngeal fat planes are preserved. The nasopharynx, oropharynx and hypopharynx are unremarkable in appearance. The visualized portions  of the valleculae and piriform sinuses are grossly unremarkable. Mild calcification is seen at the carotid bifurcations bilaterally. The parotid and submandibular glands are within normal limits. No cervical lymphadenopathy is seen. IMPRESSION: 1. No evidence of traumatic intracranial injury or fracture. 2. No evidence of fracture or dislocation with regard to the maxillofacial structures. 3. Mild to moderate cortical volume loss and scattered small vessel ischemic microangiopathy. 4. Degenerative flattening at the right temporomandibular joint. 5. Mild calcification at the carotid bifurcation dilated. Carotid ultrasound could be considered for further evaluation, when and as deemed clinically appropriate. Electronically Signed   By: Roanna Raider M.D.    On: 05/11/2016 03:09   Dg Chest Portable 1 View  Result Date: 05/16/2016 CLINICAL DATA:  Chest pain and shortness of breath this morning. EXAM: PORTABLE CHEST 1 VIEW COMPARISON:  05/11/2016 FINDINGS: Postoperative changes in the mediastinum. Shallow inspiration. Cardiac enlargement. No pulmonary vascular congestion. Elevation of left hemidiaphragm with linear atelectasis in the lung bases. No focal consolidation. No blunting of costophrenic angles. No pneumothorax. Calcified and tortuous aorta. IMPRESSION: Cardiac enlargement without vascular congestion. Atelectasis in the lung bases with elevation of left hemidiaphragm. No change since prior study. Electronically Signed   By: Burman Nieves M.D.   On: 05/16/2016 05:14   Dg Chest Portable 1 View  Result Date: 05/11/2016 CLINICAL DATA:  Left-sided chest wall pain after falling 1 day ago. EXAM: PORTABLE CHEST 1 VIEW COMPARISON:  05/01/2016 FINDINGS: There is unchanged cardiomegaly. Right lung is clear. There is unchanged left hemidiaphragm elevation. Left lung is clear except for minor linear scarring or atelectasis above the elevated hemidiaphragm. Pulmonary vasculature is normal. IMPRESSION: Stable elevation of the left hemidiaphragm.  Unchanged cardiomegaly. Electronically Signed   By: Ellery Plunk M.D.   On: 05/11/2016 03:17   Ct Maxillofacial Wo Contrast  Result Date: 05/11/2016 CLINICAL DATA:  Acute onset of bradycardia and syncope. Status post fall face first onto floor. Hit forehead and nose. Initial encounter. EXAM: CT HEAD WITHOUT CONTRAST CT MAXILLOFACIAL WITHOUT CONTRAST TECHNIQUE: Multidetector CT imaging of the head and maxillofacial structures were performed using the standard protocol without intravenous contrast. Multiplanar CT image reconstructions of the maxillofacial structures were also generated. COMPARISON:  None. FINDINGS: CT HEAD FINDINGS Brain: No evidence of acute infarction, hemorrhage, hydrocephalus, extra-axial  collection or mass lesion/mass effect. Prominence of the ventricles and sulci reflects mild to moderate cortical volume loss. Cerebellar atrophy is noted. Mild periventricular white matter change likely reflects small vessel ischemic microangiopathy. The brainstem and fourth ventricle are within normal limits. The basal ganglia are unremarkable in appearance. The cerebral hemispheres demonstrate grossly normal gray-white differentiation. No mass effect or midline shift is seen. Vascular: No hyperdense vessel or unexpected calcification. Skull: There is no evidence of fracture; visualized osseous structures are unremarkable in appearance. Other: No significant soft tissue abnormalities are seen. CT MAXILLOFACIAL FINDINGS Osseous: There is no evidence of fracture or dislocation. The maxilla and mandible appear intact. The nasal bone is unremarkable in appearance. The visualized dentition demonstrates no acute abnormality. Degenerative flattening is noted at the right temporomandibular joint. Orbits: The orbits are intact bilaterally. Sinuses: The visualized paranasal sinuses and mastoid air cells are well-aerated. Soft tissues: No significant soft tissue abnormalities are seen. The parapharyngeal fat planes are preserved. The nasopharynx, oropharynx and hypopharynx are unremarkable in appearance. The visualized portions of the valleculae and piriform sinuses are grossly unremarkable. Mild calcification is seen at the carotid bifurcations bilaterally. The parotid and submandibular glands are within normal limits.  No cervical lymphadenopathy is seen. IMPRESSION: 1. No evidence of traumatic intracranial injury or fracture. 2. No evidence of fracture or dislocation with regard to the maxillofacial structures. 3. Mild to moderate cortical volume loss and scattered small vessel ischemic microangiopathy. 4. Degenerative flattening at the right temporomandibular joint. 5. Mild calcification at the carotid bifurcation dilated.  Carotid ultrasound could be considered for further evaluation, when and as deemed clinically appropriate. Electronically Signed   By: Roanna Raider M.D.   On: 05/11/2016 03:09    Assessment/Plan  1 history of combined systolic and diastolic CHF-he has seen cardiology recently and thought to be stable-he is on torsemide 40 mg in the morning 20 mg at night-this was slightly reduced on November 13 by Dr. Chales Abrahams secondary to patient's stability and weights and edema as well as his request to be back on his home dose.  His BUN does continue to rise however somewhat-his weights are stable edema appears stable clinically he is not complaining of significant increase shortness of breath says he feels stronger.  Will slightly reduced torsemide to 40 mg in the morning and then 20 mg every other day.  Update a BMP next laboratory day which will be Monday, December 4.  Also monitor weights closely and notify provider of gain greater than 3 pounds.  Patient feels this change will also help with his frequency of urination during the night.  #2 history of aortic valve stenosis --Status post TAVR has tolerated this well-seen by cardiology on 05/29/2016 thought to be stable.  #3-fibrillation this appears rate controlled he is on Lopressor as well as amiodarone he does have a pacemaker continues on Eliquis For anticoagulation   [id number]     London Sheer, [place] [phone]

## 2016-06-10 ENCOUNTER — Encounter: Payer: Self-pay | Admitting: Internal Medicine

## 2016-06-10 ENCOUNTER — Non-Acute Institutional Stay (SKILLED_NURSING_FACILITY): Payer: Medicare Other | Admitting: Internal Medicine

## 2016-06-10 DIAGNOSIS — Z952 Presence of prosthetic heart valve: Secondary | ICD-10-CM

## 2016-06-10 DIAGNOSIS — Z950 Presence of cardiac pacemaker: Secondary | ICD-10-CM | POA: Diagnosis not present

## 2016-06-10 DIAGNOSIS — I5042 Chronic combined systolic (congestive) and diastolic (congestive) heart failure: Secondary | ICD-10-CM | POA: Diagnosis not present

## 2016-06-10 DIAGNOSIS — E119 Type 2 diabetes mellitus without complications: Secondary | ICD-10-CM

## 2016-06-10 DIAGNOSIS — I10 Essential (primary) hypertension: Secondary | ICD-10-CM | POA: Diagnosis not present

## 2016-06-10 NOTE — Progress Notes (Signed)
Location:   [facility] Room Number: 160/P Place of Service:  SNF (31)  Provider: Justiss Gerbino,Charee Tumblin  PCP: Colette Ribas, MD Patient Care Team: Assunta Found, MD as PCP - General (Family Medicine)  Extended Emergency Contact Information Primary Emergency Contact: Tammi Sou Address: [address]          [place], [place] [address] [place] Home Phone: [phone] Mobile Phone: [phone] Relation: Spouse Secondary Emergency Contact: Rennis Harding [place] Mobile Phone: [phone] Relation: Daughter  Code Status:Full Code Goals of care:  Advanced Directive information Advanced Directives 06/10/2016  Does Patient Have a Medical Advance Directive? Yes  Type of Advance Directive (No Data)  Does patient want to make changes to medical advance directive? No - Patient declined  Copy of Healthcare Power of Attorney in Chart? -  Would patient like information on creating a medical advance directive? -     Allergies  Allergen Reactions  . Other     Patient is allergic to something but doesn't know the name.  . Pramipexole Swelling    Chief Complaint  Patient presents with  . Discharge Note    HPI:  82 y.o. male  seen today for discharge later this week from the facility.  He appears to be doing quite well.  He was here for therapy after hospitalization Recently underwent aTAVR on 04/30/2016-tolerated procedure well and was discharged home but he had syncope on November 4 and was found to have complete heart block requiring pacing.  His meds were held then he went to sinus rhythm and he was discharged here for rehabilitation.  However he woke up with chest pain was found to have W CT   because of rapid atrial fibrillation  He had a pacemaker implant on 05/16/2016.  He has been started on Eliquis--with his history of atrial fibrillation his Plavix was discontinued he continues on metoprolol as well as  amiodarone.  He does have a history of combined systolic and diastolic CHF and is on torsemide we slightly reduced this recently secondary to his BUN rising-his weight appears to be stable nonetheless at 162 pounds he is also on potassium supplementation.  Patient also is a type II diabetic he is on Glucophage CBGs have been stable in the 100s quite consistently.  He will be going home with his wife who is very supportive he would benefit from continued PT and OT as well as home health evaluation with his multiple medical issues  He says he feels he is doing very well he does not complain any chest pain or shortness of breath-he does have a history of allergies and is taking Zyrtec currently he says this is helping he does not complain of any increased cough or congestion      Past Medical History:  Diagnosis Date  . Age-related macular degeneration, dry, both eyes   . Arthritis    "just about all over my body"  . Carotid artery disease (HCC)    a. Carotid US 10/17: R 1-39; L 40-59  . Chronic combined systolic and diastolic CHF (congestive heart failure) (HCC)    a. 11/2015 Echo: EF 30-35%, diff HK, Gr1 DD.  Marland Kitchen Coronary artery disease    a. 1991 PTCA of LAD; b. 1998 CABG x 5 (LIMA->LAD, VG->RPDA->RPLA, VG->OM1, VG->D2; c. 09/2014 MV: lat ischemia, EF 37%; d. 10/2014 PCI: LAD 100, LCX 99ost/100p, RCA 100p, LIMA->LAD ok, VG->D2 ok, VG->OM1 95(3.0x15 Resolute Integrity DES), VG->PDA ok w/ 30% w/in  VG->PL. // d. LHC 9/17: grafts patent, med Rx continued  . GERD (gastroesophageal reflux disease)   . High cholesterol   . History of hiatal hernia   . Hypertension   . Ischemic cardiomyopathy    a. 11/2015 Echo: EF 30-35%, diff HK, Gr1 DD.  Marland Kitchen LBBB (left bundle branch block)   . Osteoporosis   . PAD (peripheral artery disease) (HCC)    a. s/p prior rotational atherectomy of L pop and tib trunk and PTA of focal popliteal lesions; b. 03/2015 ABI: R - 0.88, L 0.96, LCFA 50-79%, RSFA 50-79% distal  stenosis, 3 vessel runoff.  . Palpitations    a. 06/2012 Event monitor: freq ventricular ectopy w/o VT.  Marland Kitchen Persistent atrial fibrillation (HCC)    a. CHADS2-VASc=6 // b. Eliquis started 11/17 // c. Amiodarone started 11/17  . PVC (premature ventricular contraction) 04/26/1997   on beta blocker/pt had cardio net monitor 06/23/2012  . RLS (restless legs syndrome) 10/01/2012  . Severe aortic stenosis    a. 11/2015 Echo: EF 30-35%, diff HK, Gr1 DD, mod AS, mild AI, mildly dil Ao root and Asc Ao, mild MR, mod dil LA, PASP . // b. Dob Echo 9/17 c/w severe AS // c. s/p TAVR 10/17 // d. Echo 05/01/16: Mod conc LVH, EF 45-50, Gr 1 DD, s/p TAVR with normal function, mean gradient 10 mmHg, mild MR, trivial PI  . Tachy-brady syndrome (HCC)    s/p CRT-P in 11/17  . Type II diabetes mellitus (HCC)   . Unspecified hereditary and idiopathic peripheral neuropathy 10/01/2012    Past Surgical History:  Procedure Laterality Date  . APPENDECTOMY    . CARDIAC CATHETERIZATION  04/26/1997   CAD - 50-60% prox to mid LAD with 70% stenosis in mid-distal, 70-80% 1st diag stenosis, 50-60% ostial stenosis in large 2nd diagonal; diffuse RCA stenosis with 40-50% osital narrowing with 70% prox stenosis (Dr. Bishop Limbo) >> CABG  . CARDIAC CATHETERIZATION  11/01/2014  . CARDIAC CATHETERIZATION N/A 04/05/2016   Procedure: Right/Left Heart Cath and Coronary/Graft Angiography;  Surgeon: Lennette Bihari, MD;  Location[facility] INVASIVE CV LAB;  Service: Cardiovascular;  Laterality: N/A;  . CARDIAC CATHETERIZATION N/A 05/11/2016   Procedure: Temporary Pacemaker;  Surgeon: Yates Decamp, MD;  Location: [facility] INVASIVE CV LAB;  Service: Cardiovascular;  Laterality: N/A;  . CATARACT EXTRACTION W/ INTRAOCULAR LENS  IMPLANT, BILATERAL Bilateral   . CORONARY ARTERY BYPASS GRAFT  04/1997   CABGx5 - LIMA to LAD, SVG to PDA, SVG to PLA, VG to OM1, VG to 2nd diagonal (Dr. Dorris Fetch)  . DOPPLER ECHOCARDIOGRAPHY  05/18/2012   EF 45-50%, LV systolic  function mildly reduced; borderline LA enlargment; mild mitral annular calcif, mild-mod MR; mild TR with normal RVSP; mild calcification of AV leaflets and mild-mod valvular AS with mild regurg      . DOPPLER ECHOCARDIOGRAPHY  08/19/2011   EF 50-55%, mod conc LVH; mild mitral annular calcif with mild MR; mild-mod TR; AV mildly sclerotic with mild valvular AS and mild regurg, mild aortic root dilatation  . EP IMPLANTABLE DEVICE N/A 05/16/2016   Procedure: BiV Pacemaker Insertion CRT-P;  Surgeon: Marinus Maw, MD;  Location: [facility] INVASIVE CV LAB;  Service: Cardiovascular;  Laterality: N/A;  . HERNIA REPAIR    . HIATAL HERNIA REPAIR    . INGUINAL HERNIA REPAIR Bilateral   . LEFT AND RIGHT HEART CATHETERIZATION WITH CORONARY ANGIOGRAM N/A 11/01/2014   Procedure: LEFT AND RIGHT HEART CATHETERIZATION WITH CORONARY ANGIOGRAM;  Surgeon: Clovis Pu  Tresa EndoKelly, MD; EF35-40%, mild AS w/ 2+ AI, LAD, CFX, RCA 100%, LIMA-LAD OK, SVG-D1 OK, SVG-PDA 30%, SVG-OM 95%  . LOWER EXTREMITY ANGIOGRAM  02/05/2011   Diamondback orbital rotational atherectomy, percutaneous transluminal angioplasty of high-grade calcified popliteal & tibioperoneal stenosis (Dr. Erlene QuanJ. Berry)  . LOWER EXTREMITY ARTERIAL DOPPLER  02/2012   RLE - mild to mod arterial insuff; RSFA 50-69% diameter reduction; R pop 50-69% diameter reduction; posterior tibial (R) demonstrates occlusive disease; LSFA/prox pop 0-49% diameter reduction; L distal pop 50-69% diameter reduction; posterior tibial (L) demonstrates occlusive disease  . NM MYOCAR PERF EJECTION FRACTION  09/2011   lexiscan - no reversible ischmai, fixed anteroseptal defect r/t LBBB; EF 51%; septal hypokinesis; low risk but abnormal  . PERCUTANEOUS CORONARY STENT INTERVENTION (PCI-S) N/A 11/02/2014   Procedure: PERCUTANEOUS CORONARY STENT INTERVENTION (PCI-S);  Surgeon: Lennette Biharihomas A Kelly, MD; 3.015 mm Resolute integrity DES to the SVG-OM   . TEE WITHOUT CARDIOVERSION N/A 04/30/2016   Procedure: TRANSESOPHAGEAL  ECHOCARDIOGRAM (TEE);  Surgeon: Kathleene Hazelhristopher D McAlhany, MD;  [facility];  Service: Open Heart Surgery;  Laterality: N/A;  . TONSILLECTOMY    . TRANSCATHETER AORTIC VALVE REPLACEMENT, TRANSFEMORAL N/A 04/30/2016   Procedure: TRANSCATHETER AORTIC VALVE REPLACEMENT, TRANSFEMORAL;  Surgeon: Kathleene Hazelhristopher D McAlhany, MD;  [facility];  Service: Open Heart Surgery;  Laterality: N/A;      reports that he has never smoked. He has never used smokeless tobacco. He reports that he does not drink alcohol or use drugs. Social History   Social History  . Marital status: Married    Spouse name: Maralyn SagoSarah  . Number of children: 1  . Years of education: College   Occupational History  .  Retired   Social History Main Topics  . Smoking status: Never Smoker  . Smokeless tobacco: Never Used  . Alcohol use No  . Drug use: No  . Sexual activity: No   Other Topics Concern  . Not on file   Social History Narrative   Pt lives at home with his spouse.   Has been married 6993yrs.   Caffeine ZOX:WRUEAVse:rarely, is right handed   Functional Status Survey:    Allergies  Allergen Reactions  . Other     Patient is allergic to something but doesn't know the name.  . Pramipexole Swelling    Pertinent  Health Maintenance Due  Topic Date Due  . FOOT EXAM  01/25/1938  . OPHTHALMOLOGY EXAM  01/25/1938  . INFLUENZA VACCINE  06/07/2017 (Originally 02/06/2016)  . PNA vac Low Risk Adult (1 of 2 - PCV13) 06/07/2017 (Originally 01/25/1995)  . HEMOGLOBIN A1C  10/25/2016    Medications:   Medication List       Accurate as of 06/10/16  4:34 PM. Always use your most recent med list.          acetaminophen 325 MG tablet Commonly known as:  TYLENOL Take 325-650 mg by mouth every 6 (six) hours as needed for mild pain or moderate pain.   amiodarone 200 MG tablet Commonly known as:  PACERONE Take 1 tablet (200 mg total) by mouth daily.   amLODipine 10 MG tablet Commonly known as:  NORVASC Take 1 tablet (10  mg total) by mouth daily.   apixaban 5 MG Tabs tablet Commonly known as:  ELIQUIS Take 1 tablet (5 mg total) by mouth 2 (two) times daily.   aspirin 81 MG tablet Take 81 mg by mouth every evening.   atorvastatin 40 MG tablet Commonly known as:  LIPITOR TAKE 1 TABLET DAILY AT 6 P.M. (KEEP OFFICE VISIT)   cetirizine 10 MG tablet Commonly known as:  ZYRTEC Take 10 mg by mouth daily as needed for allergies.   Dextromethorphan-Guaifenesin 5-100 MG/5ML Liqd Give 15 cc by mouth every 6 hour prn   gabapentin 300 MG capsule Commonly known as:  NEURONTIN Take 1 capsule (300 mg total) by mouth at bedtime.   hydrALAZINE 10 MG tablet Commonly known as:  APRESOLINE Take 2 tablets (20 mg total) by mouth every 8 (eight) hours.   lisinopril 10 MG tablet Commonly known as:  PRINIVIL,ZESTRIL Take 1 tablet (10 mg total) by mouth daily.   metformin 1000 MG (OSM) 24 hr tablet Commonly known as:  FORTAMET Take 1,000 mg by mouth 2 (two) times daily with a meal.   metoprolol 50 MG tablet Commonly known as:  LOPRESSOR Take 1 tablet (50 mg total) by mouth 2 (two) times daily.   multivitamin-iron-minerals-folic acid chewable tablet Chew 1 tablet by mouth daily.   PRESERVISION AREDS 2 PO Take 1 capsule by mouth 2 (two) times daily.   niacin 750 MG CR tablet Commonly known as:  NIASPAN Take 2 tablets (1,500 mg total) by mouth at bedtime.   nitroGLYCERIN 0.4 MG SL tablet Commonly known as:  NITROSTAT Place 1 tablet (0.4 mg total) under the tongue every 5 (five) minutes as needed for chest pain.   oxymetazoline 0.05 % nasal spray Commonly known as:  AFRIN Place 2 sprays into both nostrils 3 (three) times daily as needed for congestion (Allergies).   polycarbophil 625 MG tablet Commonly known as:  FIBERCON Take 1,250 mg by mouth daily.   potassium chloride SA 20 MEQ tablet Commonly known as:  K-DUR,KLOR-CON Take 1 tablet (20 mEq total) by mouth daily.   torsemide 20 MG  tablet Commonly known as:  DEMADEX Give once a day every other day.   torsemide 20 MG tablet Commonly known as:  DEMADEX Take 40 mg by mouth daily.       Review of Systems  Constitutional:  Says he feels well weight appears to be stable Negative for chills, diaphoresis, fatigueand fever.  Has lost almost 15 lbs in past 2 months since surgery. HENT: Does complain of allergy type symptoms he says is relieved with Zyrtec  Respiratory: Negative. Negative for apnea, cough choking, chest tightness, shortness of breath, says he gets a little short-winded in therapy but this is not new when expected with the exertion no wheezingand stridor.  Cardiovascular: Negative. Negative for chest pain, palpitations edema he says is stable Gastrointestinal: Negative. Negative for abdominal distention, blood in stool, constipation, diarrheaand nausea.  Genitourinary: . Negative for difficulty urinating, dysuria, flank painand hematuria.  Musculoskeletal: Negative.  Neurological: Positive for weakness which has improved somewhat. Negative for dizziness, facial asymmetry, light-headednessand headaches.  Psychiatric/Behavioral: Negative. Negative for agitation, behavioral problems, confusion, decreased concentrationand dysphoric mood.     Temperature 98.0 pulse 64 respirations 18 blood pressure 124/54 weight appears to be stable at 162  Physical Exam   Constitutional: He is oriented to person, place, and time. He appears well-developedand well-nourished. --Sitting comfortably in his wheelchair HENT:  Head: Normocephalic. He has prescription lenses Mouth/Throat: Oropharynx is clear and moist.  Neck: Neck supple.  Cardiovascular: Normal rateand normal heart sounds. An irregular rhythmpresent.  No murmurheard. Pulmonary/Chest: Effort normaland breath sounds normal. No respiratory distress. He has no wheezes. He has no rales. He exhibits no tenderness.  Abdominal: Soft. Bowel  sounds are normal. He exhibits  no distensionand no mass. There is no tenderness. There is no reboundand no guarding.  Musculoskeletal: He exhibits mild pedal edema -- more on the right versus left he says this is not new there appears to be some mild edema above his sock line which appears baseline from previous exam  Neurological: He is alertand oriented to person, place, and time.  Had good strength in all extremitieslargely ambulating and wheelchair  Psychiatric: He has a normal mood and affect. His behavior is normal. Judgmentand thought contentnormal. Very pleasant conversational  Labs reviewed: Basic Metabolic Panel:  Recent Labs  16/04/9609/25/17 0345  05/11/16 0440  05/17/16 0340 05/27/16 0811 06/04/16 0615  NA 139  < >  --   < > 140 141 140  K 3.8  < >  --   < > 4.2 3.6 3.8  CL 101  < >  --   < > 109 102 100*  CO2 29  < >  --   < > 21* 30 29  GLUCOSE 96  < >  --   < > 134* 135* 144*  BUN 9  < >  --   < > 23* 31* 40*  CREATININE 0.79  < > 1.12  < > 1.02 1.14 1.11  CALCIUM 8.7*  < >  --   < > 8.7* 9.7 10.0  MG 2.0  --  2.0  --   --   --   --   < > = values in this interval not displayed. Liver Function Tests:  Recent Labs  06/26/15 1123 04/26/16 1331  AST 20 25  ALT 15 19  ALKPHOS 59 53  BILITOT 0.5 0.6  PROT 7.3 6.4*  ALBUMIN 0.5* 4.1   No results for input(s): LIPASE, AMYLASE in the last 8760 hours. No results for input(s): AMMONIA in the last 8760 hours. CBC:  Recent Labs  03/27/16 1509  05/11/16 0140  05/16/16 0444 05/17/16 0340 05/27/16 0811  WBC 7.3  < > 10.5  < > 8.7 8.5 8.3  NEUTROABS 4,234  --  7.7  --   --   --   --   HGB 12.8*  < > 11.8*  < > 11.5* 10.8* 12.4*  HCT 38.8  < > 34.6*  < > 35.0* 33.4* 38.8*  MCV 93.3  < > 91.8  < > 93.8 94.4 97.0  PLT 195  < > 192  < > 215 188 170  < > = values in this interval not displayed. Cardiac Enzymes:  Recent Labs  05/16/16 0715 05/16/16 1319 05/16/16 1857  TROPONINI 0.18* 1.13* 1.36*    BNP: Invalid input(s): POCBNP CBG:  Recent Labs  05/16/16 2047 05/17/16 0759 05/17/16 1120  GLUCAP 219* 144* 181*    Procedures and Imaging Studies During Stay: Dg Chest 2 View  Result Date: 05/17/2016 CLINICAL DATA:  Pacemaker EXAM: CHEST  2 VIEW COMPARISON:  Yesterday FINDINGS: New biventricular ICD/ pacer. Leads overlap the right atrial appendage, right ventricle, and left ventricle. Status post transcatheter aortic valve replacement. The aorta is tortuous, with low and rightward positioning of CABG ostial markers. Chronic elevation of the left diaphragm. Pulmonary edema is resolved. No pneumothorax. IMPRESSION: No acute finding after biventricular pacer implant. Resolved pulmonary edema. Electronically Signed   By: Marnee SpringJonathon  Watts M.D.   On: 05/17/2016 08:01   Dg Chest Portable 1 View  Result Date: 05/16/2016 CLINICAL DATA:  Chest pain and shortness of breath this morning. EXAM: PORTABLE CHEST 1 VIEW COMPARISON:  05/11/2016  FINDINGS: Postoperative changes in the mediastinum. Shallow inspiration. Cardiac enlargement. No pulmonary vascular congestion. Elevation of left hemidiaphragm with linear atelectasis in the lung bases. No focal consolidation. No blunting of costophrenic angles. No pneumothorax. Calcified and tortuous aorta. IMPRESSION: Cardiac enlargement without vascular congestion. Atelectasis in the lung bases with elevation of left hemidiaphragm. No change since prior study. Electronically Signed   By: Burman Nieves M.D.   On: 05/16/2016 05:14    Assessment/Plan:     #1 history of combined systolic and diastolic CHF-this appears to be stable he is on torsemide orally 40 mg in the morning 20 mg every other afternoon-BUN was somewhat elevated at 40 on lab done on November 28- torsemide was slightly reduced update BMP is pending-also will update a BMP before discharge his BMP was 151.5 on lab done 05/16/2016  #2 history of aortic valve replacement status postTAVR-this  appears stable clinically he was seen by cardiology on November 22-and was thought to be doing well he does not complain of chest pain or shortness of breath   #3 history of atrial fibrillation this appears rate controlled on Lopressor and amiodarone he is on Eliquis for anticoagulation.  #4 history diabetes type 2 this is stable on Glucophage CBGs are largely in the 100s.  #5 history of biventricular pacemaker this is stable and followed by Dr. Ladona Ridgel.  #6 history of hyperlipidemia he is on atorvastatin and niacin will need follow-up patient primary care provider.  #7 history of hypertension he is on Norvasc hydralazine Lopressor and lisinopril this appears to be somewhat variable recent blood pressures 124/54-147/83-129/70-148/85 at this point will defer to primary care provider I do not see consistent systolic elevations.  Number 8-- history of intermittent allergy symptoms he says he responds well to Zyrtec is not complaining of any shortness of breath or significant cough says Zyrtec is helping  Again he will need continued PT and OT as well as home health support and home he appears to have done well here again will update a BNP and BMP before discharge    Patient is being discharged with the following home health services:  PT and OT as well as home health evaluation with his medical issues   :    Patient has been advised to f/u with their PCP in 1-2 weeks to bring them up to date on their rehab stay.  Social services at facility was responsible for arranging this appointment.  Pt was provided with a 30 day supply of prescriptions for medications and refills must be obtained from their PCP.  For controlled substances, a more limited supply may be provided adequate until PCP appointment only.   [id number] note greater than 30 minutes spent on this discharge summary-greater than 50% of time spent coordinating plan of care for numerous diagnoses

## 2016-06-11 ENCOUNTER — Encounter: Payer: Self-pay | Admitting: Internal Medicine

## 2016-06-11 ENCOUNTER — Non-Acute Institutional Stay (SKILLED_NURSING_FACILITY): Payer: Medicare Other | Admitting: Internal Medicine

## 2016-06-11 ENCOUNTER — Other Ambulatory Visit (HOSPITAL_COMMUNITY)
Admission: RE | Admit: 2016-06-11 | Discharge: 2016-06-11 | Disposition: A | Discharge status: Home / Self Care | Payer: Medicare Other | Source: Skilled Nursing Facility | Attending: Internal Medicine | Admitting: Internal Medicine

## 2016-06-11 DIAGNOSIS — N3000 Acute cystitis without hematuria: Secondary | ICD-10-CM

## 2016-06-11 DIAGNOSIS — E1140 Type 2 diabetes mellitus with diabetic neuropathy, unspecified: Secondary | ICD-10-CM | POA: Diagnosis present

## 2016-06-11 LAB — URINALYSIS, ROUTINE W REFLEX MICROSCOPIC
BILIRUBIN URINE: NEGATIVE
GLUCOSE, UA: NEGATIVE mg/dL
Ketones, ur: NEGATIVE mg/dL
Nitrite: NEGATIVE
PROTEIN: 100 mg/dL — AB
Specific Gravity, Urine: 1.015 (ref 1.005–1.030)
pH: 6 (ref 5.0–8.0)

## 2016-06-11 LAB — URINALYSIS, MICROSCOPIC (REFLEX)

## 2016-06-11 NOTE — Progress Notes (Signed)
Location:   [facility] Room Number: 160/P Place of Service:  SNF (31) Provider:  Marty HeckAnjali,Christianjames Soule  [name], [name], MD  Patient Care Team: Assunta FoundJohn [name], MD as PCP - General [facility](Family Medicine)  Extended Emergency Contact Information Primary Emergency Contact: Tammi Souominick,Sarah H Address: [address]          [place], [place] [address] [place] Home Phone: [phone] Mobile Phone: [phone] Relation: Spouse Secondary Emergency Contact: Rennis Hardingaylor,Lisa  [place] Mobile Phone: [phone] Relation: Daughter  Code Status:  Full Code Goals of care: Advanced Directive information Advanced Directives 06/11/2016  Does Patient Have a Medical Advance Directive? Yes  Type of Advance Directive (No Data)  Does patient want to make changes to medical advance directive? No - Patient declined  Copy of Healthcare Power of Attorney in Chart? -  Would patient like information on creating a medical advance directive? -     Chief Complaint  Patient presents with  . Acute Visit    Possible UTI    HPI:  Pt is a 82 y.o. male seen today for an acute visit for Urinary symptoms. Patient last night had episode of fever and chills. His BP was also elevated. This morning he c/o Dysuria. Patient says he is feeling much better now with no Fever or chills but continue to have Dysuria, increase frequency and urgency. Patient is getting discharged tomorrow home with his wife. His UA is positive for WBC and bacteria.   Past Medical History:  Diagnosis Date  . Age-related macular degeneration, dry, both eyes   . Arthritis    "just about all over my body"  . Carotid artery disease (HCC)    a. Carotid US 10/17: R 1-39; L 40-59  . Chronic combined systolic and diastolic CHF (congestive heart failure) (HCC)    a. 11/2015 Echo: EF 30-35%, diff HK, Gr1 DD.  Marland Kitchen. Coronary artery disease    a. 1991 PTCA of LAD; b. 1998 CABG x 5 (LIMA->LAD, VG->RPDA->RPLA,  VG->OM1, VG->D2; c. 09/2014 MV: lat ischemia, EF 37%; d. 10/2014 PCI: LAD 100, LCX 99ost/100p, RCA 100p, LIMA->LAD ok, VG->D2 ok, VG->OM1 95(3.0x15 Resolute Integrity DES), VG->PDA ok w/ 30% w/in VG->PL. // d. LHC 9/17: grafts patent, med Rx continued  . GERD (gastroesophageal reflux disease)   . High cholesterol   . History of hiatal hernia   . Hypertension   . Ischemic cardiomyopathy    a. 11/2015 Echo: EF 30-35%, diff HK, Gr1 DD.  Marland Kitchen. LBBB (left bundle branch block)   . Osteoporosis   . PAD (peripheral artery disease) (HCC)    a. s/p prior rotational atherectomy of L pop and tib trunk and PTA of focal popliteal lesions; b. 03/2015 ABI: R - 0.88, L 0.96, LCFA 50-79%, RSFA 50-79% distal stenosis, 3 vessel runoff.  . Palpitations    a. 06/2012 Event monitor: freq ventricular ectopy w/o VT.  Marland Kitchen. Persistent atrial fibrillation (HCC)    a. CHADS2-VASc=6 // b. Eliquis started 11/17 // c. Amiodarone started 11/17  . PVC (premature ventricular contraction) 04/26/1997   on beta blocker/pt had cardio net monitor 06/23/2012  . RLS (restless legs syndrome) 10/01/2012  . Severe aortic stenosis    a. 11/2015 Echo: EF 30-35%, diff HK, Gr1 DD, mod AS, mild AI, mildly dil Ao root and Asc Ao, mild MR, mod dil LA, PASP 46mmHg. // b. Dob Echo 9/17 c/w severe AS // c. s/p TAVR 10/17 // d. Echo 05/01/16: Mod conc LVH, EF 45-50,  Gr 1 DD, s/p TAVR with normal function, mean gradient 10 mmHg, mild MR, trivial PI  . Tachy-brady syndrome (HCC)    s/p CRT-P in 11/17  . Type II diabetes mellitus (HCC)   . Unspecified hereditary and idiopathic peripheral neuropathy 10/01/2012   Past Surgical History:  Procedure Laterality Date  . APPENDECTOMY    . CARDIAC CATHETERIZATION  04/26/1997   CAD - 50-60% prox to mid LAD with 70% stenosis in mid-distal, 70-80% 1st diag stenosis, 50-60% ostial stenosis in large 2nd diagonal; diffuse RCA stenosis with 40-50% osital narrowing with 70% prox stenosis (Dr. Bishop Limbo. Kelly) >> CABG  . CARDIAC  CATHETERIZATION  11/01/2014  . CARDIAC CATHETERIZATION N/A 04/05/2016   Procedure: Right/Left Heart Cath and Coronary/Graft Angiography;  Surgeon: Lennette Biharihomas A Kelly, MD;  [facility];  Service: Cardiovascular;  Laterality: N/A;  . CARDIAC CATHETERIZATION N/A 05/11/2016   Procedure: Temporary Pacemaker;  Surgeon: Yates DecampJay Ganji, MD;  Location: [facility]VAS[facility] CV LAB;  Service: Cardiovascular;  Laterality: N/A;  . CATARACT EXTRACTION W/ INTRAOCULAR LENS  IMPLANT, BILATERAL Bilateral   . CORONARY ARTERY BYPASS GRAFT  04/1997   CABGx5 - LIMA to LAD, SVG to PDA, SVG to PLA, VG to OM1, VG to 2nd diagonal (Dr. Dorris FetchHendrickson)  . DOPPLER ECHOCARDIOGRAPHY  05/18/2012   EF 45-50%, LV systolic function mildly reduced; borderline LA enlargment; mild mitral annular calcif, mild-mod MR; mild TR with normal RVSP; mild calcification of AV leaflets and mild-mod valvular AS with mild regurg      . DOPPLER ECHOCARDIOGRAPHY  08/19/2011   EF 50-55%, mod conc LVH; mild mitral annular calcif with mild MR; mild-mod TR; AV mildly sclerotic with mild valvular AS and mild regurg, mild aortic root dilatation  . EP IMPLANTABLE DEVICE N/A 05/16/2016   Procedure: BiV Pacemaker Insertion CRT-P;  Surgeon: Marinus MawGregg W Taylor, MD;  [facility];  Service: Cardiovascular;  Laterality: N/A;  . HERNIA REPAIR    . HIATAL HERNIA REPAIR    . INGUINAL HERNIA REPAIR Bilateral   . LEFT AND RIGHT HEART CATHETERIZATION WITH CORONARY ANGIOGRAM N/A 11/01/2014   Procedure: LEFT AND RIGHT HEART CATHETERIZATION WITH CORONARY ANGIOGRAM;  Surgeon: Lennette Biharihomas A Kelly, MD; EF35-40%, mild AS w/ 2+ AI, LAD, CFX, RCA 100%, LIMA-LAD OK, SVG-D1 OK, SVG-PDA 30%, SVG-OM 95%  . LOWER EXTREMITY ANGIOGRAM  02/05/2011   Diamondback orbital rotational atherectomy, percutaneous transluminal angioplasty of high-grade calcified popliteal & tibioperoneal stenosis (Dr. Erlene QuanJ. Berry)  . LOWER EXTREMITY ARTERIAL DOPPLER  02/2012   RLE - mild to mod arterial insuff;  RSFA 50-69% diameter reduction; R pop 50-69% diameter reduction; posterior tibial (R) demonstrates occlusive disease; LSFA/prox pop 0-49% diameter reduction; L distal pop 50-69% diameter reduction; posterior tibial (L) demonstrates occlusive disease  . NM MYOCAR PERF EJECTION FRACTION  09/2011   lexiscan - no reversible ischmai, fixed anteroseptal defect r/t LBBB; EF 51%; septal hypokinesis; low risk but abnormal  . PERCUTANEOUS CORONARY STENT INTERVENTION (PCI-S) N/A 11/02/2014   Procedure: PERCUTANEOUS CORONARY STENT INTERVENTION (PCI-S);  Surgeon: Lennette Biharihomas A Kelly, MD; 3.015 mm Resolute integrity DES to the SVG-OM   . TEE WITHOUT CARDIOVERSION N/A 04/30/2016   Procedure: TRANSESOPHAGEAL ECHOCARDIOGRAM (TEE);  Surgeon: Kathleene Hazelhristopher D McAlhany, MD;  [facility];  Service: Open Heart Surgery;  Laterality: N/A;  . TONSILLECTOMY    . TRANSCATHETER AORTIC VALVE REPLACEMENT, TRANSFEMORAL N/A 04/30/2016   Procedure: TRANSCATHETER AORTIC VALVE REPLACEMENT, TRANSFEMORAL;  Surgeon: Kathleene Hazelhristopher D McAlhany, MD;  [facility];  Service: Open Heart  Surgery;  Laterality: N/A;    Allergies  Allergen Reactions  . Other     Patient is allergic to something but doesn't know the name.  . Pramipexole Swelling      Medication List       Accurate as of 06/11/16  1:59 PM. Always use your most recent med list.          acetaminophen 325 MG tablet Commonly known as:  TYLENOL Take 325-650 mg by mouth every 6 (six) hours as needed for mild pain or moderate pain.   amiodarone 200 MG tablet Commonly known as:  PACERONE Take 1 tablet (200 mg total) by mouth daily.   amLODipine 10 MG tablet Commonly known as:  NORVASC Take 1 tablet (10 mg total) by mouth daily.   apixaban 5 MG Tabs tablet Commonly known as:  ELIQUIS Take 1 tablet (5 mg total) by mouth 2 (two) times daily.   aspirin 81 MG tablet Take 81 mg by mouth every evening.   atorvastatin 40 MG tablet Commonly known as:  LIPITOR TAKE 1  TABLET DAILY AT 6 P.M. (KEEP OFFICE VISIT)   cetirizine 10 MG tablet Commonly known as:  ZYRTEC Take 10 mg by mouth daily as needed for allergies.   Dextromethorphan-Guaifenesin 5-100 MG/5ML Liqd Give 15 cc by mouth every 6 hour prn   gabapentin 300 MG capsule Commonly known as:  NEURONTIN Take 1 capsule (300 mg total) by mouth at bedtime.   hydrALAZINE 10 MG tablet Commonly known as:  APRESOLINE Take 2 tablets (20 mg total) by mouth every 8 (eight) hours.   lisinopril 10 MG tablet Commonly known as:  PRINIVIL,ZESTRIL Take 1 tablet (10 mg total) by mouth daily.   metformin 1000 MG (OSM) 24 hr tablet Commonly known as:  FORTAMET Take 1,000 mg by mouth 2 (two) times daily with a meal.   metoprolol 50 MG tablet Commonly known as:  LOPRESSOR Take 1 tablet (50 mg total) by mouth 2 (two) times daily.   multivitamin-iron-minerals-folic acid chewable tablet Chew 1 tablet by mouth daily.   PRESERVISION AREDS 2 PO Take 1 capsule by mouth 2 (two) times daily.   niacin 750 MG CR tablet Commonly known as:  NIASPAN Take 2 tablets (1,500 mg total) by mouth at bedtime.   nitroGLYCERIN 0.4 MG SL tablet Commonly known as:  NITROSTAT Place 1 tablet (0.4 mg total) under the tongue every 5 (five) minutes as needed for chest pain.   oxymetazoline 0.05 % nasal spray Commonly known as:  AFRIN Place 2 sprays into both nostrils 3 (three) times daily as needed for congestion (Allergies).   polycarbophil 625 MG tablet Commonly known as:  FIBERCON Take 1,250 mg by mouth daily.   potassium chloride SA 20 MEQ tablet Commonly known as:  K-DUR,KLOR-CON Take 1 tablet (20 mEq total) by mouth daily.   torsemide 20 MG tablet Commonly known as:  DEMADEX Give once a day every other day.   torsemide 20 MG tablet Commonly known as:  DEMADEX Take 40 mg by mouth daily.       Review of Systems  Constitutional: Positive for chills and fever. Negative for activity change, appetite change,  diaphoresis and fatigue.  Respiratory: Negative for apnea, cough, chest tightness and shortness of breath.   Cardiovascular: Negative for chest pain, palpitations and leg swelling.  Gastrointestinal: Negative for abdominal distention, abdominal pain, constipation, diarrhea and nausea.  Genitourinary: Positive for dysuria and frequency. Negative for decreased urine volume and hematuria.  Immunization History  Administered Date(s) Administered  . Influenza Whole 04/07/2013  . Influenza,inj,Quad PF,36+ Mos 04/25/2014  . Influenza-Unspecified 04/20/2015   Pertinent  Health Maintenance Due  Topic Date Due  . FOOT EXAM  01/25/1938  . OPHTHALMOLOGY EXAM  01/25/1938  . INFLUENZA VACCINE  06/07/2017 (Originally 02/06/2016)  . PNA vac Low Risk Adult (1 of 2 - PCV13) 06/07/2017 (Originally 01/25/1995)  . HEMOGLOBIN A1C  10/25/2016   Fall Risk  10/26/2015  Falls in the past year? No   Functional Status Survey:    Vitals:   06/11/16 1349  BP: 128/60  Pulse: 65  Resp: 18  Temp: 98.4 F (36.9 C)  TempSrc: Oral   There is no height or weight on file to calculate BMI. Physical Exam  Constitutional: He is oriented to person, place, and time. He appears well-developed and well-nourished.  Cardiovascular: Normal rate, regular rhythm and normal heart sounds.   Pulmonary/Chest: Effort normal. No respiratory distress. He has no wheezes. He has no rales.  Abdominal: Soft. Bowel sounds are normal. He exhibits no distension. There is no tenderness. There is no rebound.  Musculoskeletal: He exhibits no edema.  Neurological: He is alert and oriented to person, place, and time.  Psychiatric: He has a normal mood and affect.    Labs reviewed:  Recent Labs  05/01/16 0345  05/11/16 0440  05/17/16 0340 05/27/16 0811 06/04/16 0615  NA 139  < >  --   < > 140 141 140  K 3.8  < >  --   < > 4.2 3.6 3.8  CL 101  < >  --   < > 109 102 100*  CO2 29  < >  --   < > 21* 30 29  GLUCOSE 96  < >  --   < >  134* 135* 144*  BUN 9  < >  --   < > 23* 31* 40*  CREATININE 0.79  < > 1.12  < > 1.02 1.14 1.11  CALCIUM 8.7*  < >  --   < > 8.7* 9.7 10.0  MG 2.0  --  2.0  --   --   --   --   < > = values in this interval not displayed.  Recent Labs  06/26/15 1123 04/26/16 1331  AST 20 25  ALT 15 19  ALKPHOS 59 53  BILITOT 0.5 0.6  PROT 7.3 6.4*  ALBUMIN 0.5* 4.1    Recent Labs  03/27/16 1509  05/11/16 0140  05/16/16 0444 05/17/16 0340 05/27/16 0811  WBC 7.3  < > 10.5  < > 8.7 8.5 8.3  NEUTROABS 4,234  --  7.7  --   --   --   --   HGB 12.8*  < > 11.8*  < > 11.5* 10.8* 12.4*  HCT 38.8  < > 34.6*  < > 35.0* 33.4* 38.8*  MCV 93.3  < > 91.8  < > 93.8 94.4 97.0  PLT 195  < > 192  < > 215 188 170  < > = values in this interval not displayed. Lab Results  Component Value Date   TSH 1.102 05/16/2016   Lab Results  Component Value Date   HGBA1C 7.1 (H) 04/26/2016   Lab Results  Component Value Date   CHOL 135 09/28/2014   HDL 48 09/28/2014   LDLCALC 72 09/28/2014   TRIG 77 09/28/2014    Significant Diagnostic Results in last 30 days:  Dg Chest 2 View  Result Date: 05/17/2016 CLINICAL DATA:  Pacemaker EXAM: CHEST  2 VIEW COMPARISON:  Yesterday FINDINGS: New biventricular ICD/ pacer. Leads overlap the right atrial appendage, right ventricle, and left ventricle. Status post transcatheter aortic valve replacement. The aorta is tortuous, with low and rightward positioning of CABG ostial markers. Chronic elevation of the left diaphragm. Pulmonary edema is resolved. No pneumothorax. IMPRESSION: No acute finding after biventricular pacer implant. Resolved pulmonary edema. Electronically Signed   By: Marnee Spring M.D.   On: 05/17/2016 08:01   Dg Chest Portable 1 View  Result Date: 05/16/2016 CLINICAL DATA:  Chest pain and shortness of breath this morning. EXAM: PORTABLE CHEST 1 VIEW COMPARISON:  05/11/2016 FINDINGS: Postoperative changes in the mediastinum. Shallow inspiration. Cardiac  enlargement. No pulmonary vascular congestion. Elevation of left hemidiaphragm with linear atelectasis in the lung bases. No focal consolidation. No blunting of costophrenic angles. No pneumothorax. Calcified and tortuous aorta. IMPRESSION: Cardiac enlargement without vascular congestion. Atelectasis in the lung bases with elevation of left hemidiaphragm. No change since prior study. Electronically Signed   By: Burman Nieves M.D.   On: 05/16/2016 05:14    Assessment/Plan  UTI  Will start patient on Bactrim DS 7 days Cultures to be followed by his PCP. Patients also need follow up on his potassium with lisinopril as outpatient.   Family/ staff Communication:   Labs/tests ordered:

## 2016-06-12 ENCOUNTER — Encounter: Payer: Self-pay | Admitting: Internal Medicine

## 2016-06-12 ENCOUNTER — Non-Acute Institutional Stay (SKILLED_NURSING_FACILITY): Payer: Medicare Other | Admitting: Internal Medicine

## 2016-06-12 ENCOUNTER — Encounter (HOSPITAL_COMMUNITY)
Admission: AD | Admit: 2016-06-12 | Discharge: 2016-06-12 | Disposition: A | Discharge status: Home / Self Care | Payer: Medicare Other | Source: Skilled Nursing Facility | Attending: Internal Medicine | Admitting: Internal Medicine

## 2016-06-12 DIAGNOSIS — N390 Urinary tract infection, site not specified: Secondary | ICD-10-CM

## 2016-06-12 DIAGNOSIS — N289 Disorder of kidney and ureter, unspecified: Secondary | ICD-10-CM | POA: Diagnosis not present

## 2016-06-12 LAB — BASIC METABOLIC PANEL
ANION GAP: 9 (ref 5–15)
BUN: 43 mg/dL — AB (ref 6–20)
CALCIUM: 9.3 mg/dL (ref 8.9–10.3)
CO2: 31 mmol/L (ref 22–32)
Chloride: 96 mmol/L — ABNORMAL LOW (ref 101–111)
Creatinine, Ser: 1.75 mg/dL — ABNORMAL HIGH (ref 0.61–1.24)
GFR calc Af Amer: 39 mL/min — ABNORMAL LOW (ref >60)
GFR, EST NON AFRICAN AMERICAN: 34 mL/min — AB (ref >60)
GLUCOSE: 134 mg/dL — AB (ref 65–99)
Potassium: 4.2 mmol/L (ref 3.5–5.1)
Sodium: 136 mmol/L (ref 135–145)

## 2016-06-12 NOTE — Progress Notes (Signed)
Location:   [facility] Room Number: 160/P Place of Service:  SNF (31) Provider:  Georgeanna LeaArlo Chellsie Gomer  [name], [name], MD  Patient Care Team: Assunta FoundJohn [name], MD as PCP - General [facility](Family Medicine)  Extended Emergency Contact Information Primary Emergency Contact: Tammi Souominick,Sarah H Address: [address]          [place], [place] [address] [place] Home Phone: [phone] Mobile Phone: [phone] Relation: Spouse Secondary Emergency Contact: Rennis Hardingaylor,Lisa  [place] Mobile Phone: [phone] Relation: Daughter  Code Status:  Full Code Goals of care: Advanced Directive information Advanced Directives 06/12/2016  Does Patient Have a Medical Advance Directive? Yes  Type of Advance Directive (No Data)  Does patient want to make changes to medical advance directive? No - Patient declined  Copy of Healthcare Power of Attorney in Chart? -  Would patient like information on creating a medical advance directive? -     Chief Complaint  Patient presents with  . Acute Visit  Secondary to elevated creatinine  HPI:  Pt is a 82 y.o. male seen today for an acute visit for elevated creatinine of 1.75 with a BUN of 43. He is slated for discharge later today-he was here for rehabilitation Recently was in the hospital and underwent a TAVR he went home but had syncope was found to have complete heart block requiring pacing.  His meds were held he went into sinus rhythm anddischarged here for rehabilitation.  However he experienced chest pain was found to have a WCT because of rapid April fibrillation he had a pacemaker implant in early November He is now onamiodarone as well as metoprolol for rate control he is on  Eliquis for anticoagulation.  He also has a history of combined systolic and diastolic CHF is an on torsemide currently 40 mg the morning 20 mg every other afternoon this was recently reduced secondary to somewhat elevated BUN in  the 40s.  We did order update labs today which shows his creatinine has gone from approximately 1.1 up to 1.75 over the course of several days-BUN is still in the 40s.  He appears to be doing well he says he is not short of breath except with exertion which is not new his weight has been relatively stable. He's gained approximately a pound or 2 in the last several days.  Says he feels well.  He did complain of dysuria yesterday and has been started on Bactrim for concerns of a UTI.     Past Medical History:  Diagnosis Date  . Age-related macular degeneration, dry, both eyes   . Arthritis    "just about all over my body"  . Carotid artery disease (HCC)    a. Carotid US 10/17: R 1-39; L 40-59  . Chronic combined systolic and diastolic CHF (congestive heart failure) (HCC)    a. 11/2015 Echo: EF 30-35%, diff HK, Gr1 DD.  Marland Kitchen. Coronary artery disease    a. 1991 PTCA of LAD; b. 1998 CABG x 5 (LIMA->LAD, VG->RPDA->RPLA, VG->OM1, VG->D2; c. 09/2014 MV: lat ischemia, EF 37%; d. 10/2014 PCI: LAD 100, LCX 99ost/100p, RCA 100p, LIMA->LAD ok, VG->D2 ok, VG->OM1 95(3.0x15 Resolute Integrity DES), VG->PDA ok w/ 30% w/in VG->PL. // d. LHC 9/17: grafts patent, med Rx continued  . GERD (gastroesophageal reflux disease)   . High cholesterol   . History of hiatal hernia   . Hypertension   . Ischemic cardiomyopathy    a. 11/2015 Echo: EF 30-35%, diff HK, Gr1 DD.  .Marland Kitchen  LBBB (left bundle branch block)   . Osteoporosis   . PAD (peripheral artery disease) (HCC)    a. s/p prior rotational atherectomy of L pop and tib trunk and PTA of focal popliteal lesions; b. 03/2015 ABI: R - 0.88, L 0.96, LCFA 50-79%, RSFA 50-79% distal stenosis, 3 vessel runoff.  . Palpitations    a. 06/2012 Event monitor: freq ventricular ectopy w/o VT.  Marland Kitchen Persistent atrial fibrillation (HCC)    a. CHADS2-VASc=6 // b. Eliquis started 11/17 // c. Amiodarone started 11/17  . PVC (premature ventricular contraction) 04/26/1997   on beta  blocker/pt had cardio net monitor 06/23/2012  . RLS (restless legs syndrome) 10/01/2012  . Severe aortic stenosis    a. 11/2015 Echo: EF 30-35%, diff HK, Gr1 DD, mod AS, mild AI, mildly dil Ao root and Asc Ao, mild MR, mod dil LA, PASP . // b. Dob Echo 9/17 c/w severe AS // c. s/p TAVR 10/17 // d. Echo 05/01/16: Mod conc LVH, EF 45-50, Gr 1 DD, s/p TAVR with normal function, mean gradient 10 mmHg, mild MR, trivial PI  . Tachy-brady syndrome (HCC)    s/p CRT-P in 11/17  . Type II diabetes mellitus (HCC)   . Unspecified hereditary and idiopathic peripheral neuropathy 10/01/2012   Past Surgical History:  Procedure Laterality Date  . APPENDECTOMY    . CARDIAC CATHETERIZATION  04/26/1997   CAD - 50-60% prox to mid LAD with 70% stenosis in mid-distal, 70-80% 1st diag stenosis, 50-60% ostial stenosis in large 2nd diagonal; diffuse RCA stenosis with 40-50% osital narrowing with 70% prox stenosis (Dr. Bishop Limbo) >> CABG  . CARDIAC CATHETERIZATION  11/01/2014  . CARDIAC CATHETERIZATION N/A 04/05/2016   Procedure: Right/Left Heart Cath and Coronary/Graft Angiography;  Surgeon: Lennette Bihari, MD;  Location: [facility] INVASIVE CV LAB;  Service: Cardiovascular;  Laterality: N/A;  . CARDIAC CATHETERIZATION N/A 05/11/2016   Procedure: Temporary Pacemaker;  Surgeon: Yates Decamp, MD;  Location: [facility] INVASIVE CV LAB;  Service: Cardiovascular;  Laterality: N/A;  . CATARACT EXTRACTION W/ INTRAOCULAR LENS  IMPLANT, BILATERAL Bilateral   . CORONARY ARTERY BYPASS GRAFT  04/1997   CABGx5 - LIMA to LAD, SVG to PDA, SVG to PLA, VG to OM1, VG to 2nd diagonal (Dr. Dorris Fetch)  . DOPPLER ECHOCARDIOGRAPHY  05/18/2012   EF 45-50%, LV systolic function mildly reduced; borderline LA enlargment; mild mitral annular calcif, mild-mod MR; mild TR with normal RVSP; mild calcification of AV leaflets and mild-mod valvular AS with mild regurg      . DOPPLER ECHOCARDIOGRAPHY  08/19/2011   EF 50-55%, mod conc LVH; mild mitral annular calcif with  mild MR; mild-mod TR; AV mildly sclerotic with mild valvular AS and mild regurg, mild aortic root dilatation  . EP IMPLANTABLE DEVICE N/A 05/16/2016   Procedure: BiV Pacemaker Insertion CRT-P;  Surgeon: Marinus Maw, MD;  Location: [facility] INVASIVE CV LAB;  Service: Cardiovascular;  Laterality: N/A;  . HERNIA REPAIR    . HIATAL HERNIA REPAIR    . INGUINAL HERNIA REPAIR Bilateral   . LEFT AND RIGHT HEART CATHETERIZATION WITH CORONARY ANGIOGRAM N/A 11/01/2014   Procedure: LEFT AND RIGHT HEART CATHETERIZATION WITH CORONARY ANGIOGRAM;  Surgeon: Lennette Bihari, MD; EF35-40%, mild AS w/ 2+ AI, LAD, CFX, RCA 100%, LIMA-LAD OK, SVG-D1 OK, SVG-PDA 30%, SVG-OM 95%  . LOWER EXTREMITY ANGIOGRAM  02/05/2011   Diamondback orbital rotational atherectomy, percutaneous transluminal angioplasty of high-grade calcified popliteal & tibioperoneal stenosis (Dr. Erlene Quan)  . LOWER EXTREMITY ARTERIAL DOPPLER  02/2012   RLE - mild to mod arterial insuff; RSFA 50-69% diameter reduction; R pop 50-69% diameter reduction; posterior tibial (R) demonstrates occlusive disease; LSFA/prox pop 0-49% diameter reduction; L distal pop 50-69% diameter reduction; posterior tibial (L) demonstrates occlusive disease  . NM MYOCAR PERF EJECTION FRACTION  09/2011   lexiscan - no reversible ischmai, fixed anteroseptal defect r/t LBBB; EF 51%; septal hypokinesis; low risk but abnormal  . PERCUTANEOUS CORONARY STENT INTERVENTION (PCI-S) N/A 11/02/2014   Procedure: PERCUTANEOUS CORONARY STENT INTERVENTION (PCI-S);  Surgeon: Lennette Bihari, MD; 3.015 mm Resolute integrity DES to the SVG-OM   . TEE WITHOUT CARDIOVERSION N/A 04/30/2016   Procedure: TRANSESOPHAGEAL ECHOCARDIOGRAM (TEE);  Surgeon: Kathleene Hazel, MD;  Location: [facility] OR;  Service: Open Heart Surgery;  Laterality: N/A;  . TONSILLECTOMY    . TRANSCATHETER AORTIC VALVE REPLACEMENT, TRANSFEMORAL N/A 04/30/2016   Procedure: TRANSCATHETER AORTIC VALVE REPLACEMENT, TRANSFEMORAL;  Surgeon:  Kathleene Hazel, MD;  Location: [facility] OR;  Service: Open Heart Surgery;  Laterality: N/A;    Allergies  Allergen Reactions  . Other     Patient is allergic to something but doesn't know the name.  . Pramipexole Swelling      Medication List       Accurate as of 06/12/16 12:16 PM. Always use your most recent med list.          acetaminophen 325 MG tablet Commonly known as:  TYLENOL Take 325-650 mg by mouth every 6 (six) hours as needed for mild pain or moderate pain.   amiodarone 200 MG tablet Commonly known as:  PACERONE Take 1 tablet (200 mg total) by mouth daily.   amLODipine 10 MG tablet Commonly known as:  NORVASC Take 1 tablet (10 mg total) by mouth daily.   apixaban 5 MG Tabs tablet Commonly known as:  ELIQUIS Take 1 tablet (5 mg total) by mouth 2 (two) times daily.   aspirin 81 MG tablet Take 81 mg by mouth every evening.   atorvastatin 40 MG tablet Commonly known as:  LIPITOR TAKE 1 TABLET DAILY AT 6 P.M. (KEEP OFFICE VISIT)   cetirizine 10 MG tablet Commonly known as:  ZYRTEC Take 10 mg by mouth daily as needed for allergies.   Dextromethorphan-Guaifenesin 5-100 MG/5ML Liqd Give 15 cc by mouth every 6 hour prn   gabapentin 300 MG capsule Commonly known as:  NEURONTIN Take 1 capsule (300 mg total) by mouth at bedtime.   hydrALAZINE 10 MG tablet Commonly known as:  APRESOLINE Take 2 tablets (20 mg total) by mouth every 8 (eight) hours.   lisinopril 10 MG tablet Commonly known as:  PRINIVIL,ZESTRIL Take 1 tablet (10 mg total) by mouth daily.   metformin 1000 MG (OSM) 24 hr tablet Commonly known as:  FORTAMET Take 1,000 mg by mouth 2 (two) times daily with a meal.   metoprolol 50 MG tablet Commonly known as:  LOPRESSOR Take 1 tablet (50 mg total) by mouth 2 (two) times daily.   multivitamin-iron-minerals-folic acid chewable tablet Chew 1 tablet by mouth daily.   PRESERVISION AREDS 2 PO Take 1 capsule by mouth 2 (two) times daily.     niacin 750 MG CR tablet Commonly known as:  NIASPAN Take 2 tablets (1,500 mg total) by mouth at bedtime.   nitroGLYCERIN 0.4 MG SL tablet Commonly known as:  NITROSTAT Place 1 tablet (0.4 mg total) under the tongue every 5 (five) minutes as needed for chest pain.   oxymetazoline 0.05 % nasal spray Commonly  known as:  AFRIN Place 2 sprays into both nostrils 3 (three) times daily as needed for congestion (Allergies).   polycarbophil 625 MG tablet Commonly known as:  FIBERCON Take 1,250 mg by mouth daily.   potassium chloride SA 20 MEQ tablet Commonly known as:  K-DUR,KLOR-CON Take 1 tablet (20 mEq total) by mouth daily.   sulfamethoxazole-trimethoprim 800-160 MG tablet Commonly known as:  BACTRIM DS,SEPTRA DS Take 1 tablet by mouth 2 (two) times daily.   torsemide 20 MG tablet Commonly known as:  DEMADEX Give once a day every other day.   torsemide 20 MG tablet Commonly known as:  DEMADEX Take 40 mg by mouth daily.       Review of Systems  Constitutional:  Says he feels well weight appears to be  fairly stableNegative for chills, diaphoresis, fatigueand fever.  Has lost almost 15 lbs in past 2 months since surgery. HENT: Does complain of allergy type symptoms he says is relieved with Zyrtec  Respiratory: Negative. Negative for apnea, cough choking, chest tightness, shortness of breath,says he gets a little short-winded in therapy but this is not new when expected with the exertion nowheezingand stridor.  Cardiovascular: Negative. Negative for chest pain, palpitationsedema he says is stable Gastrointestinal: Negative. Negative for abdominal distention, blood in stool, constipation, diarrheaand nausea.  Genitourinary: . Negative for difficulty urinating, dysuria, flank painand hematuria. Says the burning with urination he felt yesterday is gone- has been started on Bactrim for suspected UTI  Musculoskeletal: Negative.  Neurological: Positive for  weakness which has improved . Negative for dizziness, facial asymmetry, light-headednessand headaches.  Psychiatric/Behavioral: Negative. Negative for agitation, behavioral problems, confusion, decreased concentrationand dysphoric mood.      Immunization History  Administered Date(s) Administered  . Influenza Whole 04/07/2013  . Influenza,inj,Quad PF,36+ Mos 04/25/2014  . Influenza-Unspecified 04/20/2015   Pertinent  Health Maintenance Due  Topic Date Due  . FOOT EXAM  01/25/1938  . OPHTHALMOLOGY EXAM  01/25/1938  . INFLUENZA VACCINE  06/07/2017 (Originally 02/06/2016)  . PNA vac Low Risk Adult (1 of 2 - PCV13) 06/07/2017 (Originally 01/25/1995)  . HEMOGLOBIN A1C  10/25/2016   Fall Risk  10/26/2015  Falls in the past year? No   Functional Status Survey:    Vitals:   06/12/16 1148  BP: (!) 145/74  Pulse: 60  Resp: 18  Temp: 98.2 F (36.8 C)  TempSrc: Oral  Weight: 164 lb (74.4 kg)   Weight is 164 pounds recently has run in the lower mid 160s Physical Exam  Constitutional: He is oriented to person, place, and time. He appears well-developedand well-nourished. --Sitting comfortably in his wheelchair HENT:  Head: Normocephalic. He has prescription lenses Mouth/Throat: Oropharynx is clear and moist.  Neck: Neck supple.  Cardiovascular: Normal rateand normal heart sounds. An irregular rhythmpresent.  No murmurheard. Pulmonary/Chest: Effort normaland breath sounds normal. No respiratory distress. He has no wheezes. He has no rales. He exhibits no tenderness.  Abdominal: Soft. Bowel sounds are normal. He exhibits no distensionand no mass. There is no tenderness. There is no reboundand no guarding.  Musculoskeletal: He exhibits mild edema --more on the right versus left he says this is not new there appears to be some  edema above his sock line which appears baseline from previous exam  Neurological: He is alertand oriented to person, place, and time.  Had good  strength in all extremitiesis able to stand without assistance and ambulate but is somewhat unsteady  Psychiatric: He has a normal mood and affect. His  behavior is normal. Judgmentand thought contentnormal. Very pleasant conversational   Labs reviewed:  Recent Labs  05/01/16 0345  05/11/16 0440  05/27/16 0811 06/04/16 0615 06/12/16 0700  NA 139  < >  --   < > 141 140 136  K 3.8  < >  --   < > 3.6 3.8 4.2  CL 101  < >  --   < > 102 100* 96*  CO2 29  < >  --   < > 30 29 31   GLUCOSE 96  < >  --   < > 135* 144* 134*  BUN 9  < >  --   < > 31* 40* 43*  CREATININE 0.79  < > 1.12  < > 1.14 1.11 1.75*  CALCIUM 8.7*  < >  --   < > 9.7 10.0 9.3  MG 2.0  --  2.0  --   --   --   --   < > = values in this interval not displayed.  Recent Labs  06/26/15 1123 04/26/16 1331  AST 20 25  ALT 15 19  ALKPHOS 59 53  BILITOT 0.5 0.6  PROT 7.3 6.4*  ALBUMIN 0.5* 4.1    Recent Labs  03/27/16 1509  05/11/16 0140  05/16/16 0444 05/17/16 0340 05/27/16 0811  WBC 7.3  < > 10.5  < > 8.7 8.5 8.3  NEUTROABS 4,234  --  7.7  --   --   --   --   HGB 12.8*  < > 11.8*  < > 11.5* 10.8* 12.4*  HCT 38.8  < > 34.6*  < > 35.0* 33.4* 38.8*  MCV 93.3  < > 91.8  < > 93.8 94.4 97.0  PLT 195  < > 192  < > 215 188 170  < > = values in this interval not displayed. Lab Results  Component Value Date   TSH 1.102 05/16/2016   Lab Results  Component Value Date   HGBA1C 7.1 (H) 04/26/2016   Lab Results  Component Value Date   CHOL 135 09/28/2014   HDL 48 09/28/2014   LDLCALC 72 09/28/2014   TRIG 77 09/28/2014    Significant Diagnostic Results in last 30 days:  Dg Chest 2 View  Result Date: 05/17/2016 CLINICAL DATA:  Pacemaker EXAM: CHEST  2 VIEW COMPARISON:  Yesterday FINDINGS: New biventricular ICD/ pacer. Leads overlap the right atrial appendage, right ventricle, and left ventricle. Status post transcatheter aortic valve replacement. The aorta is tortuous, with low and rightward positioning of CABG  ostial markers. Chronic elevation of the left diaphragm. Pulmonary edema is resolved. No pneumothorax. IMPRESSION: No acute finding after biventricular pacer implant. Resolved pulmonary edema. Electronically Signed   By: Marnee SpringJonathon  Watts M.D.   On: 05/17/2016 08:01   Dg Chest Portable 1 View  Result Date: 05/16/2016 CLINICAL DATA:  Chest pain and shortness of breath this morning. EXAM: PORTABLE CHEST 1 VIEW COMPARISON:  05/11/2016 FINDINGS: Postoperative changes in the mediastinum. Shallow inspiration. Cardiac enlargement. No pulmonary vascular congestion. Elevation of left hemidiaphragm with linear atelectasis in the lung bases. No focal consolidation. No blunting of costophrenic angles. No pneumothorax. Calcified and tortuous aorta. IMPRESSION: Cardiac enlargement without vascular congestion. Atelectasis in the lung bases with elevation of left hemidiaphragm. No change since prior study. Electronically Signed   By: Burman NievesWilliam  Stevens M.D.   On: 05/16/2016 05:14    Assessment/Plan #1 renal insufficiency with creatinine rising-I did discuss this with Dr.Gupta--via phone-she did see him  yesterday as well.  It actually appears to be doing quite well says he feels significantly better than when he first came-at this point will continue to monitor his renal function will order a metabolic panel on Friday this will need follow-up by primary care provider-also will attempt to get a primary care appointment by the end of the week to follow-up on this.  Clinically appears to be doing well.  In regards to his UTI suspected--at  this point culture will need to be followed up on he is on Bactrim apparently this is helping he denies dysuria today.  [id number]        London Sheer, CMA [phone]

## 2016-06-13 ENCOUNTER — Encounter (HOSPITAL_COMMUNITY): Payer: Self-pay | Admitting: Emergency Medicine

## 2016-06-13 ENCOUNTER — Inpatient Hospital Stay (HOSPITAL_COMMUNITY)
Admission: EM | Admit: 2016-06-13 | Discharge: 2016-07-08 | DRG: 871 | Disposition: E | Payer: Medicare Other | Attending: Internal Medicine | Admitting: Internal Medicine

## 2016-06-13 ENCOUNTER — Emergency Department (HOSPITAL_COMMUNITY): Payer: Medicare Other

## 2016-06-13 DIAGNOSIS — Z66 Do not resuscitate: Secondary | ICD-10-CM | POA: Diagnosis present

## 2016-06-13 DIAGNOSIS — I5043 Acute on chronic combined systolic (congestive) and diastolic (congestive) heart failure: Secondary | ICD-10-CM | POA: Diagnosis present

## 2016-06-13 DIAGNOSIS — I4891 Unspecified atrial fibrillation: Secondary | ICD-10-CM | POA: Diagnosis not present

## 2016-06-13 DIAGNOSIS — I248 Other forms of acute ischemic heart disease: Secondary | ICD-10-CM | POA: Diagnosis present

## 2016-06-13 DIAGNOSIS — R509 Fever, unspecified: Secondary | ICD-10-CM

## 2016-06-13 DIAGNOSIS — R0602 Shortness of breath: Secondary | ICD-10-CM

## 2016-06-13 DIAGNOSIS — I509 Heart failure, unspecified: Secondary | ICD-10-CM

## 2016-06-13 DIAGNOSIS — E860 Dehydration: Secondary | ICD-10-CM | POA: Diagnosis present

## 2016-06-13 DIAGNOSIS — I480 Paroxysmal atrial fibrillation: Secondary | ICD-10-CM | POA: Diagnosis present

## 2016-06-13 DIAGNOSIS — G609 Hereditary and idiopathic neuropathy, unspecified: Secondary | ICD-10-CM | POA: Diagnosis present

## 2016-06-13 DIAGNOSIS — D649 Anemia, unspecified: Secondary | ICD-10-CM | POA: Diagnosis present

## 2016-06-13 DIAGNOSIS — K7200 Acute and subacute hepatic failure without coma: Secondary | ICD-10-CM | POA: Diagnosis present

## 2016-06-13 DIAGNOSIS — R0600 Dyspnea, unspecified: Secondary | ICD-10-CM | POA: Diagnosis not present

## 2016-06-13 DIAGNOSIS — R748 Abnormal levels of other serum enzymes: Secondary | ICD-10-CM | POA: Diagnosis not present

## 2016-06-13 DIAGNOSIS — H353130 Nonexudative age-related macular degeneration, bilateral, stage unspecified: Secondary | ICD-10-CM | POA: Diagnosis present

## 2016-06-13 DIAGNOSIS — Z7189 Other specified counseling: Secondary | ICD-10-CM

## 2016-06-13 DIAGNOSIS — I5021 Acute systolic (congestive) heart failure: Secondary | ICD-10-CM

## 2016-06-13 DIAGNOSIS — D696 Thrombocytopenia, unspecified: Secondary | ICD-10-CM | POA: Diagnosis present

## 2016-06-13 DIAGNOSIS — I959 Hypotension, unspecified: Secondary | ICD-10-CM | POA: Diagnosis present

## 2016-06-13 DIAGNOSIS — N390 Urinary tract infection, site not specified: Secondary | ICD-10-CM | POA: Diagnosis present

## 2016-06-13 DIAGNOSIS — N281 Cyst of kidney, acquired: Secondary | ICD-10-CM | POA: Diagnosis present

## 2016-06-13 DIAGNOSIS — J9601 Acute respiratory failure with hypoxia: Secondary | ICD-10-CM | POA: Diagnosis present

## 2016-06-13 DIAGNOSIS — M810 Age-related osteoporosis without current pathological fracture: Secondary | ICD-10-CM | POA: Diagnosis present

## 2016-06-13 DIAGNOSIS — I110 Hypertensive heart disease with heart failure: Secondary | ICD-10-CM | POA: Diagnosis present

## 2016-06-13 DIAGNOSIS — I420 Dilated cardiomyopathy: Secondary | ICD-10-CM | POA: Diagnosis present

## 2016-06-13 DIAGNOSIS — E785 Hyperlipidemia, unspecified: Secondary | ICD-10-CM | POA: Diagnosis present

## 2016-06-13 DIAGNOSIS — Z515 Encounter for palliative care: Secondary | ICD-10-CM | POA: Diagnosis not present

## 2016-06-13 DIAGNOSIS — I481 Persistent atrial fibrillation: Secondary | ICD-10-CM | POA: Diagnosis present

## 2016-06-13 DIAGNOSIS — I447 Left bundle-branch block, unspecified: Secondary | ICD-10-CM | POA: Diagnosis present

## 2016-06-13 DIAGNOSIS — A419 Sepsis, unspecified organism: Principal | ICD-10-CM | POA: Diagnosis present

## 2016-06-13 DIAGNOSIS — Z7982 Long term (current) use of aspirin: Secondary | ICD-10-CM

## 2016-06-13 DIAGNOSIS — R778 Other specified abnormalities of plasma proteins: Secondary | ICD-10-CM | POA: Diagnosis present

## 2016-06-13 DIAGNOSIS — I2510 Atherosclerotic heart disease of native coronary artery without angina pectoris: Secondary | ICD-10-CM | POA: Diagnosis present

## 2016-06-13 DIAGNOSIS — E871 Hypo-osmolality and hyponatremia: Secondary | ICD-10-CM

## 2016-06-13 DIAGNOSIS — J189 Pneumonia, unspecified organism: Secondary | ICD-10-CM | POA: Clinically undetermined

## 2016-06-13 DIAGNOSIS — J810 Acute pulmonary edema: Secondary | ICD-10-CM | POA: Diagnosis not present

## 2016-06-13 DIAGNOSIS — G2581 Restless legs syndrome: Secondary | ICD-10-CM | POA: Diagnosis present

## 2016-06-13 DIAGNOSIS — Z7901 Long term (current) use of anticoagulants: Secondary | ICD-10-CM

## 2016-06-13 DIAGNOSIS — Z79899 Other long term (current) drug therapy: Secondary | ICD-10-CM

## 2016-06-13 DIAGNOSIS — Z1611 Resistance to penicillins: Secondary | ICD-10-CM | POA: Diagnosis present

## 2016-06-13 DIAGNOSIS — J9621 Acute and chronic respiratory failure with hypoxia: Secondary | ICD-10-CM | POA: Diagnosis not present

## 2016-06-13 DIAGNOSIS — D6489 Other specified anemias: Secondary | ICD-10-CM | POA: Diagnosis present

## 2016-06-13 DIAGNOSIS — R042 Hemoptysis: Secondary | ICD-10-CM | POA: Diagnosis not present

## 2016-06-13 DIAGNOSIS — Y95 Nosocomial condition: Secondary | ICD-10-CM | POA: Diagnosis present

## 2016-06-13 DIAGNOSIS — E119 Type 2 diabetes mellitus without complications: Secondary | ICD-10-CM

## 2016-06-13 DIAGNOSIS — E7800 Pure hypercholesterolemia, unspecified: Secondary | ICD-10-CM

## 2016-06-13 DIAGNOSIS — N179 Acute kidney failure, unspecified: Secondary | ICD-10-CM | POA: Diagnosis present

## 2016-06-13 DIAGNOSIS — K219 Gastro-esophageal reflux disease without esophagitis: Secondary | ICD-10-CM | POA: Diagnosis present

## 2016-06-13 DIAGNOSIS — Z952 Presence of prosthetic heart valve: Secondary | ICD-10-CM

## 2016-06-13 DIAGNOSIS — I5042 Chronic combined systolic (congestive) and diastolic (congestive) heart failure: Secondary | ICD-10-CM | POA: Diagnosis not present

## 2016-06-13 DIAGNOSIS — Z961 Presence of intraocular lens: Secondary | ICD-10-CM | POA: Diagnosis present

## 2016-06-13 DIAGNOSIS — B961 Klebsiella pneumoniae [K. pneumoniae] as the cause of diseases classified elsewhere: Secondary | ICD-10-CM | POA: Diagnosis present

## 2016-06-13 DIAGNOSIS — R7989 Other specified abnormal findings of blood chemistry: Secondary | ICD-10-CM

## 2016-06-13 DIAGNOSIS — Z8249 Family history of ischemic heart disease and other diseases of the circulatory system: Secondary | ICD-10-CM

## 2016-06-13 DIAGNOSIS — I5023 Acute on chronic systolic (congestive) heart failure: Secondary | ICD-10-CM | POA: Diagnosis not present

## 2016-06-13 DIAGNOSIS — E1151 Type 2 diabetes mellitus with diabetic peripheral angiopathy without gangrene: Secondary | ICD-10-CM | POA: Diagnosis present

## 2016-06-13 DIAGNOSIS — I10 Essential (primary) hypertension: Secondary | ICD-10-CM

## 2016-06-13 DIAGNOSIS — Z955 Presence of coronary angioplasty implant and graft: Secondary | ICD-10-CM

## 2016-06-13 DIAGNOSIS — R740 Nonspecific elevation of levels of transaminase and lactic acid dehydrogenase [LDH]: Secondary | ICD-10-CM | POA: Diagnosis present

## 2016-06-13 DIAGNOSIS — I5041 Acute combined systolic (congestive) and diastolic (congestive) heart failure: Secondary | ICD-10-CM | POA: Diagnosis not present

## 2016-06-13 DIAGNOSIS — I350 Nonrheumatic aortic (valve) stenosis: Secondary | ICD-10-CM | POA: Diagnosis present

## 2016-06-13 DIAGNOSIS — I4819 Other persistent atrial fibrillation: Secondary | ICD-10-CM | POA: Diagnosis present

## 2016-06-13 DIAGNOSIS — Z951 Presence of aortocoronary bypass graft: Secondary | ICD-10-CM

## 2016-06-13 DIAGNOSIS — Z950 Presence of cardiac pacemaker: Secondary | ICD-10-CM

## 2016-06-13 DIAGNOSIS — Z888 Allergy status to other drugs, medicaments and biological substances status: Secondary | ICD-10-CM

## 2016-06-13 DIAGNOSIS — Z7984 Long term (current) use of oral hypoglycemic drugs: Secondary | ICD-10-CM

## 2016-06-13 HISTORY — DX: Chronic kidney disease, unspecified: N18.9

## 2016-06-13 LAB — TROPONIN I: Troponin I: 0.28 ng/mL (ref <0.03)

## 2016-06-13 LAB — PROTIME-INR
INR: 1.54
INR: 1.54
Prothrombin Time: 18.6 seconds — ABNORMAL HIGH (ref 11.4–15.2)
Prothrombin Time: 18.7 seconds — ABNORMAL HIGH (ref 11.4–15.2)

## 2016-06-13 LAB — COMPREHENSIVE METABOLIC PANEL
ALBUMIN: 3.6 g/dL (ref 3.5–5.0)
ALT: 74 U/L — ABNORMAL HIGH (ref 17–63)
ANION GAP: 11 (ref 5–15)
AST: 121 U/L — AB (ref 15–41)
Alkaline Phosphatase: 80 U/L (ref 38–126)
BILIRUBIN TOTAL: 0.6 mg/dL (ref 0.3–1.2)
BUN: 48 mg/dL — AB (ref 6–20)
CO2: 25 mmol/L (ref 22–32)
Calcium: 9 mg/dL (ref 8.9–10.3)
Chloride: 95 mmol/L — ABNORMAL LOW (ref 101–111)
Creatinine, Ser: 2.25 mg/dL — ABNORMAL HIGH (ref 0.61–1.24)
GFR calc Af Amer: 29 mL/min — ABNORMAL LOW (ref >60)
GFR calc non Af Amer: 25 mL/min — ABNORMAL LOW (ref >60)
GLUCOSE: 191 mg/dL — AB (ref 65–99)
POTASSIUM: 4.7 mmol/L (ref 3.5–5.1)
SODIUM: 131 mmol/L — AB (ref 135–145)
Total Protein: 7 g/dL (ref 6.5–8.1)

## 2016-06-13 LAB — URINALYSIS, ROUTINE W REFLEX MICROSCOPIC
Bacteria, UA: NONE SEEN
Bilirubin Urine: NEGATIVE
GLUCOSE, UA: NEGATIVE mg/dL
HGB URINE DIPSTICK: NEGATIVE
KETONES UR: NEGATIVE mg/dL
Nitrite: NEGATIVE
PH: 5 (ref 5.0–8.0)
Protein, ur: 30 mg/dL — AB
Specific Gravity, Urine: 1.012 (ref 1.005–1.030)

## 2016-06-13 LAB — CBC WITH DIFFERENTIAL/PLATELET
BASOS PCT: 0 %
Basophils Absolute: 0 10*3/uL (ref 0.0–0.1)
Eosinophils Absolute: 0 10*3/uL (ref 0.0–0.7)
Eosinophils Relative: 0 %
HEMATOCRIT: 31.6 % — AB (ref 39.0–52.0)
HEMOGLOBIN: 10.5 g/dL — AB (ref 13.0–17.0)
LYMPHS ABS: 0.4 10*3/uL — AB (ref 0.7–4.0)
LYMPHS PCT: 4 %
MCH: 31.3 pg (ref 26.0–34.0)
MCHC: 33.2 g/dL (ref 30.0–36.0)
MCV: 94.3 fL (ref 78.0–100.0)
MONO ABS: 1.2 10*3/uL — AB (ref 0.1–1.0)
MONOS PCT: 12 %
NEUTROS ABS: 8.5 10*3/uL — AB (ref 1.7–7.7)
NEUTROS PCT: 84 %
PLATELETS: 103 10*3/uL — AB (ref 150–400)
RBC: 3.35 MIL/uL — ABNORMAL LOW (ref 4.22–5.81)
RDW: 15 % (ref 11.5–15.5)
SMEAR REVIEW: DECREASED
WBC: 10.2 10*3/uL (ref 4.0–10.5)

## 2016-06-13 LAB — APTT: aPTT: 41 seconds — ABNORMAL HIGH (ref 24–36)

## 2016-06-13 LAB — CBG MONITORING, ED: Glucose-Capillary: 185 mg/dL — ABNORMAL HIGH (ref 65–99)

## 2016-06-13 LAB — I-STAT CG4 LACTIC ACID, ED
LACTIC ACID, VENOUS: 1.22 mmol/L (ref 0.5–1.9)
Lactic Acid, Venous: 2.88 mmol/L (ref 0.5–1.9)

## 2016-06-13 MED ORDER — NIACIN ER (ANTIHYPERLIPIDEMIC) 500 MG PO TBCR
1500.0000 mg | EXTENDED_RELEASE_TABLET | Freq: Every day | ORAL | Status: DC
  Filled 2016-06-13 (×3): qty 3

## 2016-06-13 MED ORDER — ONDANSETRON HCL 4 MG PO TABS: 4.0000 mg | ORAL_TABLET | Freq: Four times a day (QID) | ORAL | Status: DC | PRN

## 2016-06-13 MED ORDER — ACETAMINOPHEN 325 MG PO TABS
650.0000 mg | ORAL_TABLET | Freq: Once | ORAL | Status: AC
  Administered 2016-06-13: 650 mg via ORAL
  Filled 2016-06-13: qty 2

## 2016-06-13 MED ORDER — ACETAMINOPHEN 500 MG PO TABS
1000.0000 mg | ORAL_TABLET | Freq: Four times a day (QID) | ORAL | Status: DC | PRN
Start: 2016-06-13 — End: 2016-06-21
  Administered 2016-06-14 – 2016-06-16 (×2): 1000 mg via ORAL
  Filled 2016-06-13 (×2): qty 2

## 2016-06-13 MED ORDER — ASPIRIN 81 MG PO CHEW
81.0000 mg | CHEWABLE_TABLET | Freq: Every evening | ORAL | Status: DC
  Administered 2016-06-14 – 2016-06-19 (×5): 81 mg via ORAL
  Filled 2016-06-13 (×5): qty 1

## 2016-06-13 MED ORDER — ATORVASTATIN CALCIUM 40 MG PO TABS: 40.0000 mg | ORAL_TABLET | Freq: Every day | ORAL | Status: DC

## 2016-06-13 MED ORDER — VANCOMYCIN HCL IN DEXTROSE 1-5 GM/200ML-% IV SOLN
1000.0000 mg | Freq: Once | INTRAVENOUS | Status: AC
  Administered 2016-06-13: 1000 mg via INTRAVENOUS
  Filled 2016-06-13: qty 200

## 2016-06-13 MED ORDER — AMLODIPINE BESYLATE 5 MG PO TABS
10.0000 mg | ORAL_TABLET | Freq: Every day | ORAL | Status: DC
  Administered 2016-06-14: 10 mg via ORAL
  Filled 2016-06-13: qty 2

## 2016-06-13 MED ORDER — SODIUM CHLORIDE 0.9 % IV SOLN
500.0000 mg | INTRAVENOUS | Status: DC
  Administered 2016-06-14: 500 mg via INTRAVENOUS
  Filled 2016-06-13 (×3): qty 500

## 2016-06-13 MED ORDER — SODIUM CHLORIDE 0.9% FLUSH
3.0000 mL | Freq: Two times a day (BID) | INTRAVENOUS | Status: DC
  Administered 2016-06-14 – 2016-06-21 (×11): 3 mL via INTRAVENOUS

## 2016-06-13 MED ORDER — ENSURE ENLIVE PO LIQD
237.0000 mL | Freq: Two times a day (BID) | ORAL | Status: DC
  Administered 2016-06-14 – 2016-06-20 (×8): 237 mL via ORAL

## 2016-06-13 MED ORDER — APIXABAN 5 MG PO TABS
5.0000 mg | ORAL_TABLET | Freq: Two times a day (BID) | ORAL | Status: DC
  Administered 2016-06-13 – 2016-06-16 (×7): 5 mg via ORAL
  Filled 2016-06-13 (×8): qty 1

## 2016-06-13 MED ORDER — ONDANSETRON HCL 4 MG/2ML IJ SOLN
4.0000 mg | Freq: Four times a day (QID) | INTRAMUSCULAR | Status: DC | PRN
  Administered 2016-06-19: 4 mg via INTRAVENOUS
  Filled 2016-06-13: qty 2

## 2016-06-13 MED ORDER — PIPERACILLIN-TAZOBACTAM 3.375 G IVPB
3.3750 g | Freq: Three times a day (TID) | INTRAVENOUS | Status: DC
  Administered 2016-06-14 – 2016-06-15 (×4): 3.375 g via INTRAVENOUS
  Filled 2016-06-13 (×4): qty 50

## 2016-06-13 MED ORDER — METOPROLOL TARTRATE 50 MG PO TABS
50.0000 mg | ORAL_TABLET | Freq: Two times a day (BID) | ORAL | Status: DC
  Administered 2016-06-14 – 2016-06-17 (×7): 50 mg via ORAL
  Filled 2016-06-13 (×7): qty 1

## 2016-06-13 MED ORDER — INSULIN ASPART 100 UNIT/ML ~~LOC~~ SOLN
0.0000 [IU] | Freq: Three times a day (TID) | SUBCUTANEOUS | Status: DC
  Administered 2016-06-14: 3 [IU] via SUBCUTANEOUS
  Administered 2016-06-14: 2 [IU] via SUBCUTANEOUS
  Administered 2016-06-15: 3 [IU] via SUBCUTANEOUS
  Administered 2016-06-15: 1 [IU] via SUBCUTANEOUS
  Administered 2016-06-15: 3 [IU] via SUBCUTANEOUS
  Administered 2016-06-16 (×2): 2 [IU] via SUBCUTANEOUS
  Administered 2016-06-16: 1 [IU] via SUBCUTANEOUS
  Administered 2016-06-17: 9 [IU] via SUBCUTANEOUS
  Administered 2016-06-17: 7 [IU] via SUBCUTANEOUS

## 2016-06-13 MED ORDER — PIPERACILLIN-TAZOBACTAM 3.375 G IVPB 30 MIN
3.3750 g | Freq: Once | INTRAVENOUS | Status: AC
  Administered 2016-06-13: 3.375 g via INTRAVENOUS
  Filled 2016-06-13: qty 50

## 2016-06-13 MED ORDER — SODIUM CHLORIDE 0.9 % IV SOLN
1000.0000 mL | INTRAVENOUS | Status: DC
  Administered 2016-06-13 – 2016-06-14 (×5): 1000 mL via INTRAVENOUS

## 2016-06-13 MED ORDER — SODIUM CHLORIDE 0.9 % IV BOLUS (SEPSIS)
500.0000 mL | Freq: Once | INTRAVENOUS | Status: AC
  Administered 2016-06-13: 500 mL via INTRAVENOUS

## 2016-06-13 MED ORDER — AMIODARONE HCL 200 MG PO TABS
200.0000 mg | ORAL_TABLET | Freq: Every day | ORAL | Status: DC
  Administered 2016-06-14 – 2016-06-17 (×4): 200 mg via ORAL
  Filled 2016-06-13 (×4): qty 1

## 2016-06-13 MED ORDER — SODIUM CHLORIDE 0.9 % IV SOLN: 1000.0000 mL | INTRAVENOUS | Status: DC

## 2016-06-13 MED ORDER — GABAPENTIN 300 MG PO CAPS
300.0000 mg | ORAL_CAPSULE | Freq: Every day | ORAL | Status: DC
  Administered 2016-06-13: 300 mg via ORAL
  Filled 2016-06-13: qty 1

## 2016-06-13 NOTE — Progress Notes (Signed)
Pharmacy Antibiotic Note  Melvin [name] is a 82 y.o. male seen in ED on 07/05/2016 with UTI.  Pharmacy has been consulted for Vancomycin and Zosyn dosing.  Initial doses ordered by EDP.  Plan: Vancomycin 500mg  IV every 24 hours.  Goal trough 10-15 mcg/mL.  Zosyn 3.375gm IV every 8 hours. Follow-up micro data, labs, vitals.   Height: 5\' 6"  (167.6 cm) Weight: 164 lb (74.4 kg) IBW/kg (Calculated) : 63.8  Temp (24hrs), Avg:102.7 F (39.3 C), Min:100.8 F (38.2 C), Max:104.6 F (40.3 C)   Recent Labs Lab 06/12/16 0700  CREATININE 1.75*    Estimated Creatinine Clearance: 27.3 mL/min (by C-G formula based on SCr of 1.75 mg/dL (H)).    Allergies  Allergen Reactions  . Other     Patient is allergic to something but doesn't know the name.  . Pramipexole Swelling    Antimicrobials this admission: Vancomycin 12/7  >>  Zosyn 12/7 >>   Dose adjustments this admission: n/a   Microbiology results: 12/5 UCx: Klebsiella (pending)   Thank you for allowing pharmacy to be a part of this patient's care.  Mady GemmaHayes, Adison Jerger R 06/07/2016 7:26 PM

## 2016-06-13 NOTE — ED Triage Notes (Signed)
[facility] being treated for UTI with keflex. Patient reports increased fever and urinary frequency today with fever of 103.1. Temp 100.8 in triage.

## 2016-06-13 NOTE — ED Notes (Signed)
Was in with pt nurse trying to get iv started. Tried to run I-stat lactic all bubbles not enough blood to run,.

## 2016-06-13 NOTE — ED Notes (Signed)
CRITICAL VALUE ALERT  Critical value received:  Troponin 0.28  Date of notification:  06/12/2016  Time of notification:  2038  Critical value read back: yes  Nurse who received alert:  Tenna DelaineLori Kimarion Chery, RN  MD notified (1st page):  Dr. Clarene DukeMcManus  Time of first page:  2038  MD notified (2nd page):  Time of second page:  Responding MD:  Dr. Clarene DukeMcManus  Time MD responded:  2039

## 2016-06-13 NOTE — ED Notes (Signed)
Went to check on pt Dr in with pt @ this time

## 2016-06-13 NOTE — H&P (Signed)
History and Physical    Melvin LappingBenjamin W Kampe ZOX:[id number] DOB: 1927-10-04 DOA: 06/12/2016  PCP: Colette RibasGOLDING, [name], MD Consultants:  Vascular surgery; Tresa EndoKelly - cardiology; Ladona Ridgelaylor - EP cardiology Patient coming from: home (but just discharged [facility] yesterday); lives with wife; NOK: wife, [phone]  Chief Complaint: fever  HPI: Melvin [name] is a 82 y.o. male with medical history significant of CAD s/p remote CABG, VHD s/p TAVR 10/24 with post-procedure PAF, HTN, HLD, DM, chronic CHF/mixed CM, pacemaker placement on Nov 4 due to complete heart block.  He was discharged to [facility] for [facility] on 11/9.   He was doing well with his rehab and approaching discharge when he was seen on 12/5 with acute onset of urinary symptoms as well as fever and chills.  He was started on Bactrim DS for 7 days; urine culture is growing >100k colonies of Klebsiella Ornithinolytica (sensitivites pending).   He was discharged the next day, on 12/6, despite a creatinine up from baseline of 1.1 to 1.75. His wife reports that he was doing fair until today, but she was surprised he couldn't do more after going through all that exercise.  Started feeling bad today about noon with fever to 103 at home.   Wife says he started feeling badly about noon, a few hours later he started shaking.    Occasional cough, not sure If new/different.  +productive, mostly clear mucus (slight pink tinge x 1).  Slight blood-tinged nasal secretion.  Urinary symptoms yesterday morning.      ED Course: Per Dr. Clarene DukeMcManus: 2110: Rectal temp 104; code sepsis called. [facility]. BP stable. Judicious IVF given for mildly elevated BUN/Cr and lactic acid due to CHF on CXR. Troponin elevated, but lower than last month. APAP given for fever (4 hours after LD) with slow decrease; cooling blanket applied. T/C to [facility]. Ophelia CharterYates, case discussed, including: HPI, pertinent PM/SHx, VS/PE, dx testing, ED course and  treatment: Agreeable to admit, requests to write temporary orders, obtain stepdown bed to team APAdmits.   Review of Systems: As per HPI; otherwise 10 point review of systems reviewed and negative.   Ambulatory Status:  Requires cane/walker to get around (cane until last night, walker since)  Past Medical History:  Diagnosis Date  . Age-related macular degeneration, dry, both eyes   . Arthritis    "just about all over my body"  . Carotid artery disease (HCC)    a. Carotid US 10/17: R 1-39; L 40-59  . Chronic combined systolic and diastolic CHF (congestive heart failure) (HCC)    a. 11/2015 Echo: EF 30-35%, diff HK, Gr1 DD.  Marland Kitchen. Chronic kidney disease   . Coronary artery disease    a. 1991 PTCA of LAD; b. 1998 CABG x 5 (LIMA->LAD, VG->RPDA->RPLA, VG->OM1, VG->D2; c. 09/2014 MV: lat ischemia, EF 37%; d. 10/2014 PCI: LAD 100, LCX 99ost/100p, RCA 100p, LIMA->LAD ok, VG->D2 ok, VG->OM1 95(3.0x15 Resolute Integrity DES), VG->PDA ok w/ 30% w/in VG->PL. // d. LHC 9/17: grafts patent, med Rx continued  . GERD (gastroesophageal reflux disease)   . High cholesterol   . History of hiatal hernia   . Hypertension   . Ischemic cardiomyopathy    a. 11/2015 Echo: EF 30-35%, diff HK, Gr1 DD.  Marland Kitchen. LBBB (left bundle branch block)   . Osteoporosis   . PAD (peripheral artery disease) (HCC)    a. s/p prior rotational atherectomy of L pop and tib trunk and PTA of  focal popliteal lesions; b. 03/2015 ABI: R - 0.88, L 0.96, LCFA 50-79%, RSFA 50-79% distal stenosis, 3 vessel runoff.  . Palpitations    a. 06/2012 Event monitor: freq ventricular ectopy w/o VT.  Marland Kitchen Persistent atrial fibrillation (HCC)    a. CHADS2-VASc=6 // b. Eliquis started 11/17 // c. Amiodarone started 11/17  . PVC (premature ventricular contraction) 04/26/1997   on beta blocker/pt had cardio net monitor 06/23/2012  . RLS (restless legs syndrome) 10/01/2012  . Severe aortic stenosis    a. 11/2015 Echo: EF 30-35%, diff HK, Gr1 DD, mod AS, mild AI,  mildly dil Ao root and Asc Ao, mild MR, mod dil LA, PASP . // b. Dob Echo 9/17 c/w severe AS // c. s/p TAVR 10/17 // d. Echo 05/01/16: Mod conc LVH, EF 45-50, Gr 1 DD, s/p TAVR with normal function, mean gradient 10 mmHg, mild MR, trivial PI  . Tachy-brady syndrome (HCC)    s/p CRT-P in 11/17  . Type II diabetes mellitus (HCC)   . Unspecified hereditary and idiopathic peripheral neuropathy 10/01/2012    Past Surgical History:  Procedure Laterality Date  . APPENDECTOMY    . CARDIAC CATHETERIZATION  04/26/1997   CAD - 50-60% prox to mid LAD with 70% stenosis in mid-distal, 70-80% 1st diag stenosis, 50-60% ostial stenosis in large 2nd diagonal; diffuse RCA stenosis with 40-50% osital narrowing with 70% prox stenosis (Dr. Bishop Limbo) >> CABG  . CARDIAC CATHETERIZATION  11/01/2014  . CARDIAC CATHETERIZATION N/A 04/05/2016   Procedure: Right/Left Heart Cath and Coronary/Graft Angiography;  Surgeon: Lennette Bihari, MD;  Location: [facility] INVASIVE CV LAB;  Service: Cardiovascular;  Laterality: N/A;  . CARDIAC CATHETERIZATION N/A 05/11/2016   Procedure: Temporary Pacemaker;  Surgeon:  Decamp, MD;  Location: [facility] INVASIVE CV LAB;  Service: Cardiovascular;  Laterality: N/A;  . CATARACT EXTRACTION W/ INTRAOCULAR LENS  IMPLANT, BILATERAL Bilateral   . CORONARY ARTERY BYPASS GRAFT  04/1997   CABGx5 - LIMA to LAD, SVG to PDA, SVG to PLA, VG to OM1, VG to 2nd diagonal (Dr. Dorris Fetch)  . DOPPLER ECHOCARDIOGRAPHY  05/18/2012   EF 45-50%, LV systolic function mildly reduced; borderline LA enlargment; mild mitral annular calcif, mild-mod MR; mild TR with normal RVSP; mild calcification of AV leaflets and mild-mod valvular AS with mild regurg      . DOPPLER ECHOCARDIOGRAPHY  08/19/2011   EF 50-55%, mod conc LVH; mild mitral annular calcif with mild MR; mild-mod TR; AV mildly sclerotic with mild valvular AS and mild regurg, mild aortic root dilatation  . EP IMPLANTABLE DEVICE N/A 05/16/2016   Procedure: BiV Pacemaker  Insertion CRT-P;  Surgeon: Marinus Maw, MD;  Location: [facility] INVASIVE CV LAB;  Service: Cardiovascular;  Laterality: N/A;  . HERNIA REPAIR    . HIATAL HERNIA REPAIR    . INGUINAL HERNIA REPAIR Bilateral   . LEFT AND RIGHT HEART CATHETERIZATION WITH CORONARY ANGIOGRAM N/A 11/01/2014   Procedure: LEFT AND RIGHT HEART CATHETERIZATION WITH CORONARY ANGIOGRAM;  Surgeon: Lennette Bihari, MD; EF35-40%, mild AS w/ 2+ AI, LAD, CFX, RCA 100%, LIMA-LAD OK, SVG-D1 OK, SVG-PDA 30%, SVG-OM 95%  . LOWER EXTREMITY ANGIOGRAM  02/05/2011   Diamondback orbital rotational atherectomy, percutaneous transluminal angioplasty of high-grade calcified popliteal & tibioperoneal stenosis (Dr. Erlene Quan)  . LOWER EXTREMITY ARTERIAL DOPPLER  02/2012   RLE - mild to mod arterial insuff; RSFA 50-69% diameter reduction; R pop 50-69% diameter reduction; posterior tibial (R) demonstrates occlusive disease; LSFA/prox pop 0-49% diameter reduction; L distal  pop 50-69% diameter reduction; posterior tibial (L) demonstrates occlusive disease  . NM MYOCAR PERF EJECTION FRACTION  09/2011   lexiscan - no reversible ischmai, fixed anteroseptal defect r/t LBBB; EF 51%; septal hypokinesis; low risk but abnormal  . PERCUTANEOUS CORONARY STENT INTERVENTION (PCI-S) N/A 11/02/2014   Procedure: PERCUTANEOUS CORONARY STENT INTERVENTION (PCI-S);  Surgeon: Lennette Bihari, MD; 3.015 mm Resolute integrity DES to the SVG-OM   . TEE WITHOUT CARDIOVERSION N/A 04/30/2016   Procedure: TRANSESOPHAGEAL ECHOCARDIOGRAM (TEE);  Surgeon: Kathleene Hazel, MD;  Location: [facility] OR;  Service: Open Heart Surgery;  Laterality: N/A;  . TONSILLECTOMY    . TRANSCATHETER AORTIC VALVE REPLACEMENT, TRANSFEMORAL N/A 04/30/2016   Procedure: TRANSCATHETER AORTIC VALVE REPLACEMENT, TRANSFEMORAL;  Surgeon: Kathleene Hazel, MD;  Location: [facility] OR;  Service: Open Heart Surgery;  Laterality: N/A;    Social History   Social History  . Marital status: Married    Spouse name:  Maralyn Sago  . Number of children: 1  . Years of education: College   Occupational History  .  Retired   Social History Main Topics  . Smoking status: Never Smoker  . Smokeless tobacco: Never Used  . Alcohol use No  . Drug use: No  . Sexual activity: No   Other Topics Concern  . Not on file   Social History Narrative   Pt lives at home with his spouse.   Has been married 42yrs.   Caffeine YNW:GNFAOZ, is right handed    Allergies  Allergen Reactions  . Other     Patient is allergic to something but doesn't know the name.  . Pramipexole Swelling    Family History  Problem Relation Age of Onset  . CAD Mother   . Heart attack Father     Prior to Admission medications   Medication Sig Start Date End Date Taking? Authorizing Provider  acetaminophen (TYLENOL) 325 MG tablet Take 325-650 mg by mouth every 6 (six) hours as needed for mild pain or moderate pain.   Yes Historical Provider, MD  amiodarone (PACERONE) 200 MG tablet Take 1 tablet (200 mg total) by mouth daily. 05/18/16  Yes Renee Norberto Sorenson, PA-C  amLODipine (NORVASC) 10 MG tablet Take 1 tablet (10 mg total) by mouth daily. 05/03/16  Yes Little Ishikawa, NP  apixaban (ELIQUIS) 5 MG TABS tablet Take 1 tablet (5 mg total) by mouth 2 (two) times daily. 05/17/16  Yes Renee Norberto Sorenson, PA-C  aspirin 81 MG tablet Take 81 mg by mouth every evening.    Yes Historical Provider, MD  atorvastatin (LIPITOR) 40 MG tablet TAKE 1 TABLET DAILY AT 6 P.M. (KEEP OFFICE VISIT) 02/05/16  Yes Lennette Bihari, MD  cetirizine (ZYRTEC) 10 MG tablet Take 10 mg by mouth daily as needed for allergies.    Yes Historical Provider, MD  Dextromethorphan-Guaifenesin 5-100 MG/5ML LIQD Give 15 cc by mouth every 6 hour prn   Yes Historical Provider, MD  gabapentin (NEURONTIN) 300 MG capsule Take 1 capsule (300 mg total) by mouth at bedtime. 10/26/15  Yes Huston Foley, MD  hydrALAZINE (APRESOLINE) 10 MG tablet Take 2 tablets (20 mg total) by mouth every 8 (eight)  hours. 01/03/16  Yes Lennette Bihari, MD  lisinopril (PRINIVIL,ZESTRIL) 10 MG tablet Take 1 tablet (10 mg total) by mouth daily. 05/15/16  Yes Renee Norberto Sorenson, PA-C  metformin (FORTAMET) 1000 MG (OSM) 24 hr tablet Take 1,000 mg by mouth 2 (two) times daily with a meal.   Yes Historical  Provider, MD  metoprolol (LOPRESSOR) 50 MG tablet Take 1 tablet (50 mg total) by mouth 2 (two) times daily. 05/17/16  Yes Renee Norberto SorensonLynn Ursuy, PA-C  Multiple Vitamins-Minerals (PRESERVISION AREDS 2 PO) Take 1 capsule by mouth 2 (two) times daily.    Yes Historical Provider, MD  multivitamin-iron-minerals-folic acid (CENTRUM) chewable tablet Chew 1 tablet by mouth daily.   Yes Historical Provider, MD  niacin (NIASPAN) 750 MG CR tablet Take 2 tablets (1,500 mg total) by mouth at bedtime. 01/22/16  Yes Lennette Biharihomas A Kelly, MD  nitroGLYCERIN (NITROSTAT) 0.4 MG SL tablet Place 1 tablet (0.4 mg total) under the tongue every 5 (five) minutes as needed for chest pain. 11/29/15  Yes Lennette Biharihomas A Kelly, MD  oxymetazoline (AFRIN) 0.05 % nasal spray Place 2 sprays into both nostrils 3 (three) times daily as needed for congestion (Allergies).   Yes Historical Provider, MD  polycarbophil (FIBERCON) 625 MG tablet Take 1,250 mg by mouth daily.    Yes Historical Provider, MD  potassium chloride SA (K-DUR,KLOR-CON) 20 MEQ tablet Take 1 tablet (20 mEq total) by mouth daily. 05/04/16  Yes Little IshikawaErin E Smith, NP  sulfamethoxazole-trimethoprim (BACTRIM DS,SEPTRA DS) 800-160 MG tablet Take 1 tablet by mouth 2 (two) times daily.   Yes Historical Provider, MD  torsemide (DEMADEX) 20 MG tablet Give once a day every other day.   Yes Historical Provider, MD  torsemide (DEMADEX) 20 MG tablet Take 40 mg by mouth daily.   Yes Historical Provider, MD    Physical Exam: Vitals:   06/09/2016 2127 06/27/2016 2129 07/06/2016 2206 06/12/2016 2317  BP:  165/70 (!) 121/53   Pulse:  75 82   Resp:  23 21   Temp: 102.4 F (39.1 C)   99 F (37.2 C)  TempSrc: Oral   Oral  SpO2:  94%  94%   Weight:    78.2 kg (172 lb 6.4 oz)  Height:    5\' 6"  (1.676 m)     General: Appears ill - febrile, tachypneic Eyes:  PERRL, EOMI, normal lids, iris ENT:  Hard of hearing, normal lips & tongue, mmm Neck:  no LAD, masses or thyromegaly Cardiovascular:  RRR, no m/r/g. 2-3+ pitting LE edema (states no change from baseline).  Remote midline sternal incision, recent faint scars from pacemaker placement in left upper chest Respiratory:  CTA bilaterally, no w/r/r. Normal respiratory effort. Abdomen:  soft, ntnd, NABS Skin:  no rash or induration seen on limited exam Musculoskeletal:  grossly normal tone BUE/BLE, good ROM, no bony abnormality Psychiatric:  grossly normal mood and affect, speech sparse but appropriate, AOx3 Neurologic:  CN 2-12 grossly intact, moves all extremities in coordinated fashion, sensation intact  Labs on Admission: I have personally reviewed following labs and imaging studies  CBC:  Recent Labs Lab 06/12/2016 1940  WBC 10.2  NEUTROABS 8.5*  HGB 10.5*  HCT 31.6*  MCV 94.3  PLT 103*   Basic Metabolic Panel:  Recent Labs Lab 06/12/16 0700 06/09/2016 1940  NA 136 131*  K 4.2 4.7  CL 96* 95*  CO2 31 25  GLUCOSE 134* 191*  BUN 43* 48*  CREATININE 1.75* 2.25*  CALCIUM 9.3 9.0   GFR: Estimated Creatinine Clearance: 23.2 mL/min (by C-G formula based on SCr of 2.25 mg/dL (H)). Liver Function Tests:  Recent Labs Lab 07/05/2016 1940  AST 121*  ALT 74*  ALKPHOS 80  BILITOT 0.6  PROT 7.0  ALBUMIN 3.6   No results for input(s): LIPASE, AMYLASE in the last 168  hours. No results for input(s): AMMONIA in the last 168 hours. Coagulation Profile:  Recent Labs Lab 06/23/16 2022  INR 1.54   Cardiac Enzymes:  Recent Labs Lab 06/23/2016 1940  TROPONINI 0.28*   BNP (last 3 results) No results for input(s): PROBNP in the last 8760 hours. HbA1C: No results for input(s): HGBA1C in the last 72 hours. CBG:  Recent Labs Lab Jun 23, 2016 1835  GLUCAP  185*   Lipid Profile: No results for input(s): CHOL, HDL, LDLCALC, TRIG, CHOLHDL, LDLDIRECT in the last 72 hours. Thyroid Function Tests: No results for input(s): TSH, T4TOTAL, FREET4, T3FREE, THYROIDAB in the last 72 hours. Anemia Panel: No results for input(s): VITAMINB12, FOLATE, FERRITIN, TIBC, IRON, RETICCTPCT in the last 72 hours. Urine analysis:    Component Value Date/Time   COLORURINE YELLOW 06/23/2016 1831   APPEARANCEUR CLEAR 06/23/16 1831   LABSPEC 1.012 2016/06/23 1831   PHURINE 5.0 2016/06/23 1831   GLUCOSEU NEGATIVE 06/23/2016 1831   HGBUR NEGATIVE 06-23-16 1831   BILIRUBINUR NEGATIVE Jun 23, 2016 1831   KETONESUR NEGATIVE June 23, 2016 1831   PROTEINUR 30 (A) 06/23/16 1831   NITRITE NEGATIVE 2016/06/23 1831   LEUKOCYTESUR TRACE (A) 2016-06-23 1831    Creatinine Clearance: Estimated Creatinine Clearance: 23.2 mL/min (by C-G formula based on SCr of 2.25 mg/dL (H)).  Sepsis Labs: @LABRCNTIP (procalcitonin:4,lacticidven:4) ) Recent Results (from the past 240 hour(s))  Urine culture     Status: Abnormal (Preliminary result)   Collection Time: 06/11/16 11:10 AM  Result Value Ref Range Status   Specimen Description URINE, CLEAN CATCH  Final   Special Requests NONE  Final   Culture >=100,000 COLONIES/mL KLEBSIELLA ORNITHINOLYTICA (A)  Final   Report Status PENDING  Incomplete  Culture, blood (Routine x 2)     Status: None (Preliminary result)   Collection Time: 23-Jun-2016  7:40 PM  Result Value Ref Range Status   Specimen Description LEFT ANTECUBITAL  Final   Special Requests BOTTLES DRAWN AEROBIC AND ANAEROBIC 6CC  Final   Culture PENDING  Incomplete   Report Status PENDING  Incomplete     Radiological Exams on Admission: Dg Chest Port 1 View  Result Date: Jun 23, 2016 CLINICAL DATA:  Fever, UTI EXAM: PORTABLE CHEST 1 VIEW COMPARISON:  05/17/2016 FINDINGS: Small left pleural effusion. Trace right pleural effusion. Bilateral interstitial thickening. No  pneumothorax. Stable cardiomegaly. Prior CABG. Prior TAVR. Cardiac pacemaker noted. No acute osseous injury. IMPRESSION: 1. Mild CHF. Electronically Signed   By: Elige Ko   On: Jun 23, 2016 19:22    EKG: Independently reviewed.  Afib/flutter with ventricularly paced complexes with rate 89; NSCSLT  Assessment/Plan Principal Problem:   Sepsis (HCC) Active Problems:   Hypertension   HLD (hyperlipidemia)   Non-insulin dependent type 2 diabetes mellitus (HCC)   Persistent atrial fibrillation (HCC)   Chronic combined systolic and diastolic CHF (congestive heart failure) (HCC)   Hyponatremia   Acute renal failure (HCC)   Elevated troponin   Anemia   Thrombocytopenia (HCC)   Sepsis, likely urinary source -Extremely high fever, WBC count 10.2 with elevated lactate to 2.88 -Sepsis protocol initiated; has not yet received all of IVF bolus due to concern for CHF on CXR (see below) -Suspect urinary source despite normal UA - he has been treated with Bactrim but is growing an unusual Klebsiella species that is apparently not sensitive to Bactrim -Blood and urine cultures pending -Will admit to SDU and continue to closely monitor -Treat with IV Zosyn and Vanc for now for undifferentiated sepsis, but once sensitivities are  back on the 12/5 culture this can likely be tapered -Will trend lactate to ensure ongoing improvement  Acute renal failure -Creatinine is 2.25, was 1.75 on 12/6, baseline 1.11 on 11/28 -It is unclear why the patient was discharged from rehab with acute renal failure (>0.5 increase from baseline) yesterday, but the persistent infection likely led to worsening -Will give gentle IVF hydration (see below - h/o CHF) and monitor; if BP decreases, will need bolus  H/o CHF -CXR today read as CHF -Patient does not appear to be volume overloaded - he has chronic LE edema that he says is no worse than baseline and his lungs are CTA -For now, will treat for sepsis with judicious  fluids -He is at high risk for CHF exacerbation based on recent valve replacement, pacemaker placement, and prior Echo on 11/22 with EF 45-50% and grade 1 diastolic dysfunction -Troponin is elevated today, but this may be associated with renal dysfunction and it is also improved from 11/9 (currently 0.28, prior was 1.36); no reported CP, will trend  Hyponatremia -Current sodium 131, prior was normal -Suspect due to volume deficiency in the setting of sepsis -Will follow with rehydration  DM -A1c on 10/20 was 7.1 -Current glucose is elevated to 191 -Tight glycemic control may improve infection but since he is a borderline ICU patient, will need to ensure that control is not overly aggressive -Will cover with SSI  Elevated LFTs -AST 121, ALT 74 -Prior was normal on 10/20 -Will trend  Anemia -Hgb 10.5, 12.4 on 11/20 -However, his norm appears to be in the 10-11 range -Will trend without intervention  Thrombocytopenia -Platelets currently 103, were 170 on 11/20 -however, in 10/17, he also had this issue -For now will continue anticoagulation with Eliquis - but need to monitor  Afib -On Eliquis -Also with TAVR -Needs AC for now but also needs careful monitoring  DVT prophylaxis: Eliquis Code Status:  Full - confirmed with patient/family Family Communication: Wife, son-in-law present   Disposition Plan:  To be determined Consults called: None Admission status: Admit to SDU - It is my clinical opinion that admission to INPATIENT is reasonable and necessary because this patient will require at least 2 midnights in the hospital to treat this condition based on the medical complexity of the problems presented.  Given the aforementioned information, the predictability of an adverse outcome is felt to be significant.    Jonah Blue MD [facility]  If 7PM-7AM, please contact night-coverage [email or web address] Password [facility]  06/26/2016, 11:27 PM

## 2016-06-13 NOTE — ED Notes (Signed)
Paged hospitalist at this time.

## 2016-06-13 NOTE — ED Notes (Signed)
Pt using urinal @ this time

## 2016-06-13 NOTE — ED Notes (Signed)
Hospitalist  notified of pt current temp.

## 2016-06-13 NOTE — ED Provider Notes (Signed)
[facility]-EM[facility]GENCY DEPT Provider Note   CSN: [id number] Arrival date & time: 06/18/2016  1821     History   Chief Complaint Chief Complaint  Patient presents with  . Urinary Tract Infection  . Fever    HPI Melvin [name] is a 82 y.o. male.  HPI Pt was seen at 1835. Per EMS and pt, c/o gradual onset and persistence of constant "fevers to 103.1" today. Has been associated with urinary frequency and "a little" cough. Pt states he was discharged from the [facility] yesterday, where he was being tx for UTI with keflex. LD tylenol approximately 1600 PTA. Denies abd pain, no back pain, no N/V/D, no CP/SOB.   Past Medical History:  Diagnosis Date  . Age-related macular degeneration, dry, both eyes   . Arthritis    "just about all over my body"  . Carotid artery disease (HCC)    a. Carotid US 10/17: R 1-39; L 40-59  . Chronic combined systolic and diastolic CHF (congestive heart failure) (HCC)    a. 11/2015 Echo: EF 30-35%, diff HK, Gr1 DD.  Marland Kitchen Coronary artery disease    a. 1991 PTCA of LAD; b. 1998 CABG x 5 (LIMA->LAD, VG->RPDA->RPLA, VG->OM1, VG->D2; c. 09/2014 MV: lat ischemia, EF 37%; d. 10/2014 PCI: LAD 100, LCX 99ost/100p, RCA 100p, LIMA->LAD ok, VG->D2 ok, VG->OM1 95(3.0x15 Resolute Integrity DES), VG->PDA ok w/ 30% w/in VG->PL. // d. LHC 9/17: grafts patent, med Rx continued  . GERD (gastroesophageal reflux disease)   . High cholesterol   . History of hiatal hernia   . Hypertension   . Ischemic cardiomyopathy    a. 11/2015 Echo: EF 30-35%, diff HK, Gr1 DD.  Marland Kitchen LBBB (left bundle branch block)   . Osteoporosis   . PAD (peripheral artery disease) (HCC)    a. s/p prior rotational atherectomy of L pop and tib trunk and PTA of focal popliteal lesions; b. 03/2015 ABI: R - 0.88, L 0.96, LCFA 50-79%, RSFA 50-79% distal stenosis, 3 vessel runoff.  . Palpitations    a. 06/2012 Event monitor: freq ventricular ectopy w/o VT.  Marland Kitchen Persistent atrial fibrillation (HCC)    a. CHADS2-VASc=6 // b.  Eliquis started 11/17 // c. Amiodarone started 11/17  . PVC (premature ventricular contraction) 04/26/1997   on beta blocker/pt had cardio net monitor 06/23/2012  . RLS (restless legs syndrome) 10/01/2012  . Severe aortic stenosis    a. 11/2015 Echo: EF 30-35%, diff HK, Gr1 DD, mod AS, mild AI, mildly dil Ao root and Asc Ao, mild MR, mod dil LA, PASP . // b. Dob Echo 9/17 c/w severe AS // c. s/p TAVR 10/17 // d. Echo 05/01/16: Mod conc LVH, EF 45-50, Gr 1 DD, s/p TAVR with normal function, mean gradient 10 mmHg, mild MR, trivial PI  . Tachy-brady syndrome (HCC)    s/p CRT-P in 11/17  . Type II diabetes mellitus (HCC)   . Unspecified hereditary and idiopathic peripheral neuropathy 10/01/2012    Patient Active Problem List   Diagnosis Date Noted  . Status post biventricular pacemaker 05/20/2016  . Chronic combined systolic and diastolic CHF (congestive heart failure) (HCC) 05/20/2016  . Persistent atrial fibrillation (HCC) 05/16/2016  . Complete heart block (HCC) 05/11/2016  . S/P TAVR (transcatheter aortic valve replacement) 05/11/2016  . Skin lesion 02/11/2015  . Coronary artery disease involving native heart without angina pectoris 09/28/2014  . Tachycardia-bradycardia syndrome (HCC) 04/26/2014  . Non-insulin dependent type 2 diabetes mellitus (HCC) 07/02/2013  . Osteoporosis, unspecified 06/24/2013  .  Edema, lower extremity 06/22/2013  . LBBB (left bundle branch block) 12/01/2012  . Heart palpitations 12/01/2012  . Unspecified hereditary and idiopathic peripheral neuropathy 10/01/2012  . RLS (restless legs syndrome) 10/01/2012  . Hypertension 11/26/2011  . HLD (hyperlipidemia) 11/26/2011  . Liver lesion 11/26/2011    Past Surgical History:  Procedure Laterality Date  . APPENDECTOMY    . CARDIAC CATHETERIZATION  04/26/1997   CAD - 50-60% prox to mid LAD with 70% stenosis in mid-distal, 70-80% 1st diag stenosis, 50-60% ostial stenosis in large 2nd diagonal; diffuse RCA  stenosis with 40-50% osital narrowing with 70% prox stenosis (Dr. Bishop Limbo. Kelly) >> CABG  . CARDIAC CATHETERIZATION  11/01/2014  . CARDIAC CATHETERIZATION N/A 04/05/2016   Procedure: Right/Left Heart Cath and Coronary/Graft Angiography;  Surgeon: Lennette Biharihomas A Kelly, MD;  [facility];  Service: Cardiovascular;  Laterality: N/A;  . CARDIAC CATHETERIZATION N/A 05/11/2016   Procedure: Temporary Pacemaker;  Surgeon: Yates DecampJay Ganji, MD;  Location: [facility]AS[facility] CV LAB;  Service: Cardiovascular;  Laterality: N/A;  . CATARACT EXTRACTION W/ INTRAOCULAR LENS  IMPLANT, BILATERAL Bilateral   . CORONARY ARTERY BYPASS GRAFT  04/1997   CABGx5 - LIMA to LAD, SVG to PDA, SVG to PLA, VG to OM1, VG to 2nd diagonal (Dr. Dorris FetchHendrickson)  . DOPPLER ECHOCARDIOGRAPHY  05/18/2012   EF 45-50%, LV systolic function mildly reduced; borderline LA enlargment; mild mitral annular calcif, mild-mod MR; mild TR with normal RVSP; mild calcification of AV leaflets and mild-mod valvular AS with mild regurg      . DOPPLER ECHOCARDIOGRAPHY  08/19/2011   EF 50-55%, mod conc LVH; mild mitral annular calcif with mild MR; mild-mod TR; AV mildly sclerotic with mild valvular AS and mild regurg, mild aortic root dilatation  . EP IMPLANTABLE DEVICE N/A 05/16/2016   Procedure: BiV Pacemaker Insertion CRT-P;  Surgeon: Marinus MawGregg W Taylor, MD;  [facility];  Service: Cardiovascular;  Laterality: N/A;  . HERNIA REPAIR    . HIATAL HERNIA REPAIR    . INGUINAL HERNIA REPAIR Bilateral   . LEFT AND RIGHT HEART CATHETERIZATION WITH CORONARY ANGIOGRAM N/A 11/01/2014   Procedure: LEFT AND RIGHT HEART CATHETERIZATION WITH CORONARY ANGIOGRAM;  Surgeon: Lennette Biharihomas A Kelly, MD; EF35-40%, mild AS w/ 2+ AI, LAD, CFX, RCA 100%, LIMA-LAD OK, SVG-D1 OK, SVG-PDA 30%, SVG-OM 95%  . LOWER EXTREMITY ANGIOGRAM  02/05/2011   Diamondback orbital rotational atherectomy, percutaneous transluminal angioplasty of high-grade calcified popliteal & tibioperoneal stenosis (Dr.  Erlene QuanJ. Berry)  . LOWER EXTREMITY ARTERIAL DOPPLER  02/2012   RLE - mild to mod arterial insuff; RSFA 50-69% diameter reduction; R pop 50-69% diameter reduction; posterior tibial (R) demonstrates occlusive disease; LSFA/prox pop 0-49% diameter reduction; L distal pop 50-69% diameter reduction; posterior tibial (L) demonstrates occlusive disease  . NM MYOCAR PERF EJECTION FRACTION  09/2011   lexiscan - no reversible ischmai, fixed anteroseptal defect r/t LBBB; EF 51%; septal hypokinesis; low risk but abnormal  . PERCUTANEOUS CORONARY STENT INTERVENTION (PCI-S) N/A 11/02/2014   Procedure: PERCUTANEOUS CORONARY STENT INTERVENTION (PCI-S);  Surgeon: Lennette Biharihomas A Kelly, MD; 3.015 mm Resolute integrity DES to the SVG-OM   . TEE WITHOUT CARDIOVERSION N/A 04/30/2016   Procedure: TRANSESOPHAGEAL ECHOCARDIOGRAM (TEE);  Surgeon: Kathleene Hazelhristopher D McAlhany, MD;  [facility];  Service: Open Heart Surgery;  Laterality: N/A;  . TONSILLECTOMY    . TRANSCATHETER AORTIC VALVE REPLACEMENT, TRANSFEMORAL N/A 04/30/2016   Procedure: TRANSCATHETER AORTIC VALVE REPLACEMENT, TRANSFEMORAL;  Surgeon: Kathleene Hazelhristopher D McAlhany, MD;  Location: [facility] OR;  Service: Open Heart Surgery;  Laterality: N/A;       Home Medications    Prior to Admission medications   Medication Sig Start Date End Date Taking? Authorizing Provider  acetaminophen (TYLENOL) 325 MG tablet Take 325-650 mg by mouth every 6 (six) hours as needed for mild pain or moderate pain.    Historical Provider, MD  amiodarone (PACERONE) 200 MG tablet Take 1 tablet (200 mg total) by mouth daily. 05/18/16   Renee Norberto Sorenson, PA-C  amLODipine (NORVASC) 10 MG tablet Take 1 tablet (10 mg total) by mouth daily. 05/03/16   Little Ishikawa, NP  apixaban (ELIQUIS) 5 MG TABS tablet Take 1 tablet (5 mg total) by mouth 2 (two) times daily. 05/17/16   Renee Norberto Sorenson, PA-C  aspirin 81 MG tablet Take 81 mg by mouth every evening.     Historical Provider, MD  atorvastatin (LIPITOR) 40 MG tablet  TAKE 1 TABLET DAILY AT 6 P.M. (KEEP OFFICE VISIT) 02/05/16   Lennette Bihari, MD  cetirizine (ZYRTEC) 10 MG tablet Take 10 mg by mouth daily as needed for allergies.     Historical Provider, MD  Dextromethorphan-Guaifenesin 5-100 MG/5ML LIQD Give 15 cc by mouth every 6 hour prn    Historical Provider, MD  gabapentin (NEURONTIN) 300 MG capsule Take 1 capsule (300 mg total) by mouth at bedtime. 10/26/15   Huston Foley, MD  hydrALAZINE (APRESOLINE) 10 MG tablet Take 2 tablets (20 mg total) by mouth every 8 (eight) hours. 01/03/16   Lennette Bihari, MD  lisinopril (PRINIVIL,ZESTRIL) 10 MG tablet Take 1 tablet (10 mg total) by mouth daily. 05/15/16   Renee Norberto Sorenson, PA-C  metformin (FORTAMET) 1000 MG (OSM) 24 hr tablet Take 1,000 mg by mouth 2 (two) times daily with a meal.    Historical Provider, MD  metoprolol (LOPRESSOR) 50 MG tablet Take 1 tablet (50 mg total) by mouth 2 (two) times daily. 05/17/16   Renee Norberto Sorenson, PA-C  Multiple Vitamins-Minerals (PRESERVISION AREDS 2 PO) Take 1 capsule by mouth 2 (two) times daily.     Historical Provider, MD  multivitamin-iron-minerals-folic acid (CENTRUM) chewable tablet Chew 1 tablet by mouth daily.    Historical Provider, MD  niacin (NIASPAN) 750 MG CR tablet Take 2 tablets (1,500 mg total) by mouth at bedtime. 01/22/16   Lennette Bihari, MD  nitroGLYCERIN (NITROSTAT) 0.4 MG SL tablet Place 1 tablet (0.4 mg total) under the tongue every 5 (five) minutes as needed for chest pain. 11/29/15   Lennette Bihari, MD  oxymetazoline (AFRIN) 0.05 % nasal spray Place 2 sprays into both nostrils 3 (three) times daily as needed for congestion (Allergies).    Historical Provider, MD  polycarbophil (FIBERCON) 625 MG tablet Take 1,250 mg by mouth daily.     Historical Provider, MD  potassium chloride SA (K-DUR,KLOR-CON) 20 MEQ tablet Take 1 tablet (20 mEq total) by mouth daily. 05/04/16   Little Ishikawa, NP  sulfamethoxazole-trimethoprim (BACTRIM DS,SEPTRA DS) 800-160 MG tablet Take 1  tablet by mouth 2 (two) times daily.    Historical Provider, MD  torsemide (DEMADEX) 20 MG tablet Give once a day every other day.    Historical Provider, MD  torsemide (DEMADEX) 20 MG tablet Take 40 mg by mouth daily.    Historical Provider, MD    Family History Family History  Problem Relation Age of Onset  . CAD Mother   . Heart attack Father     Social History Social History  Substance Use Topics  . Smoking status: Never Smoker  . Smokeless tobacco: Never Used  . Alcohol use No     Allergies   Other and Pramipexole   Review of Systems Review of Systems ROS: Statement: All systems negative except as marked or noted in the HPI; Constitutional: +fever and chills. ; ; Eyes: Negative for eye pain, redness and discharge. ; ; ENMT: Negative for ear pain, hoarseness, nasal congestion, sinus pressure and sore throat. ; ; Cardiovascular: Negative for chest pain, palpitations, diaphoresis, dyspnea and peripheral edema. ; ; Respiratory: +cough. Negative for wheezing and stridor. ; ; Gastrointestinal: Negative for nausea, vomiting, diarrhea, abdominal pain, blood in stool, hematemesis, jaundice and rectal bleeding. . ; ; Genitourinary: +urinary frequency. Negative for dysuria, flank pain and hematuria. ; ; Musculoskeletal: Negative for back pain and neck pain. Negative for swelling and trauma.; ; Skin: Negative for pruritus, rash, abrasions, blisters, bruising and skin lesion.; ; Neuro: Negative for headache, lightheadedness and neck stiffness. Negative for weakness, altered level of consciousness, altered mental status, extremity weakness, paresthesias, involuntary movement, seizure and syncope.      Physical Exam Updated Vital Signs BP (!) 143/52 (BP Location: Left Arm)   Pulse 74   Temp (!) 104.6 F (40.3 C) (Rectal)   Resp 26   Ht 5\' 6"  (1.676 m)   Wt 164 lb (74.4 kg)   SpO2 97%   BMI 26.47 kg/m    Patient Vitals for the past 24 hrs:  BP Temp Temp src Pulse Resp SpO2 Height  Weight  06/12/2016 2129 165/70 - - 75 23 94 % - -  07/06/2016 2127 - 102.4 F (39.1 C) Oral - - - - -  06/19/2016 2030 112/57 - - 79 22 94 % - -  06/30/2016 2000 123/58 - - 80 20 94 % - -  06/09/2016 1850 - (!) 104.6 F (40.3 C) Rectal - - - - -  06/12/2016 1828 - - - - - - 5\' 6"  (1.676 m) 164 lb (74.4 kg)  06/28/2016 1827 (!) 143/52 100.8 F (38.2 C) Oral 74 26 97 % - -     Physical Exam 1840: Physical examination:  Nursing notes reviewed; Vital signs and O2 SAT reviewed;  Constitutional: Well developed, Well nourished, Well hydrated, In no acute distress; Head:  Normocephalic, atraumatic; Eyes: EOMI, PERRL, No scleral icterus; ENMT: Mouth and pharynx normal, Mucous membranes moist; Neck: Supple, Full range of motion, No lymphadenopathy; Cardiovascular: Regular rate and rhythm, No gallop; Respiratory: Breath sounds clear & equal bilaterally, No wheezes.  Speaking full sentences with ease, Normal respiratory effort/excursion; Chest: Nontender, Movement normal; Abdomen: Soft, Nontender, Nondistended, Normal bowel sounds; Genitourinary: No CVA tenderness; Extremities: Pulses normal, No tenderness, +1-2 pedal edema bilat with weeping. No calf asymmetry.; Neuro: AA&Ox3, Major CN grossly intact.  Speech clear. No gross focal motor deficits in extremities.; Skin: Color normal, Warm, Dry.   ED Treatments / Results  Labs (all labs ordered are listed, but only abnormal results are displayed)   EKG  EKG Interpretation  Date/Time:  Thursday June 13 2016 18:55:10 EST Ventricular Rate:  89 PR Interval:    QRS Duration: 182 QT Interval:  533 QTC Calculation: 619 R Axis:   -46 Text Interpretation:  Afib/flut and V-paced complexes No further rhythm analysis attempted due to paced rhythm Left bundle branch block When compared with ECG of 05/16/2016 No significant change was found Confirmed by [facility]  MD, Nicholos Johns [phone]) on 07/06/2016 7:01:17 PM  Radiology   Procedures Procedures (including  critical care time)  Medications Ordered in ED Medications  piperacillin-tazobactam (ZOSYN) IVPB 3.375 g (not administered)  vancomycin (VANCOCIN) IVPB 1000 mg/200 mL premix (not administered)  0.9 %  sodium chloride infusion (not administered)     Initial Impression / Assessment and Plan / ED Course  I have reviewed the triage vital signs and the nursing notes.  Pertinent labs & imaging results that were available during my care of the patient were reviewed by me and considered in my medical decision making (see chart for details).  MDM Reviewed: previous chart, nursing note and vitals Reviewed previous: labs and ECG Interpretation: labs, ECG and x-ray Total time providing critical care: 30-74 minutes. This excludes time spent performing separately reportable procedures and services. Consults: admitting MD   CRITICAL CARE Performed by: Laray AngerMCMANUS,Nethaniel Mattie M Total critical care time: 35 minutes Critical care time was exclusive of separately billable procedures and treating other patients. Critical care was necessary to treat or prevent imminent or life-threatening deterioration. Critical care was time spent personally by me on the following activities: development of treatment plan with patient and/or surrogate as well as nursing, discussions with consultants, evaluation of patient's response to treatment, examination of patient, obtaining history from patient or surrogate, ordering and performing treatments and interventions, ordering and review of laboratory studies, ordering and review of radiographic studies, pulse oximetry and re-evaluation of patient's condition.   Results for orders placed or performed during the hospital encounter of January 31, 2016  Culture, blood (Routine x 2)  Result Value Ref Range   Specimen Description LEFT ANTECUBITAL    Special Requests BOTTLES DRAWN AEROBIC AND ANAEROBIC 6CC    Culture PENDING    Report Status PENDING   Comprehensive metabolic panel    Result Value Ref Range   Sodium 131 (L) 135 - 145 mmol/L   Potassium 4.7 3.5 - 5.1 mmol/L   Chloride 95 (L) 101 - 111 mmol/L   CO2 25 22 - 32 mmol/L   Glucose, Bld 191 (H) 65 - 99 mg/dL   BUN 48 (H) 6 - 20 mg/dL   Creatinine, Ser 1.612.25 (H) 0.61 - 1.24 mg/dL   Calcium 9.0 8.9 - 09.610.3 mg/dL   Total Protein 7.0 6.5 - 8.1 g/dL   Albumin 3.6 3.5 - 5.0 g/dL   AST 045121 (H) 15 - 41 U/L   ALT 74 (H) 17 - 63 U/L   Alkaline Phosphatase 80 38 - 126 U/L   Total Bilirubin 0.6 0.3 - 1.2 mg/dL   GFR calc non Af Amer 25 (L) >60 mL/min   GFR calc Af Amer 29 (L) >60 mL/min   Anion gap 11 5 - 15  CBC with Differential  Result Value Ref Range   WBC 10.2 4.0 - 10.5 K/uL   RBC 3.35 (L) 4.22 - 5.81 MIL/uL   Hemoglobin 10.5 (L) 13.0 - 17.0 g/dL   HCT 40.931.6 (L) 81.139.0 - 91.452.0 %   MCV 94.3 78.0 - 100.0 fL   MCH 31.3 26.0 - 34.0 pg   MCHC 33.2 30.0 - 36.0 g/dL   RDW 78.215.0 95.611.5 - 21.315.5 %   Platelets 103 (L) 150 - 400 K/uL   Neutrophils Relative % 84 %   Neutro Abs 8.5 (H) 1.7 - 7.7 K/uL   Lymphocytes Relative 4 %   Lymphs Abs 0.4 (L) 0.7 - 4.0 K/uL   Monocytes Relative 12 %   Monocytes Absolute 1.2 (H) 0.1 - 1.0 K/uL   Eosinophils Relative 0 %  Eosinophils Absolute 0.0 0.0 - 0.7 K/uL   Basophils Relative 0 %   Basophils Absolute 0.0 0.0 - 0.1 K/uL   RBC Morphology ROULEAUX    Smear Review PLATELETS APPEAR DECREASED   Urinalysis, Routine w reflex microscopic  Result Value Ref Range   Color, Urine YELLOW YELLOW   APPearance CLEAR CLEAR   Specific Gravity, Urine 1.012 1.005 - 1.030   pH 5.0 5.0 - 8.0   Glucose, UA NEGATIVE NEGATIVE mg/dL   Hgb urine dipstick NEGATIVE NEGATIVE   Bilirubin Urine NEGATIVE NEGATIVE   Ketones, ur NEGATIVE NEGATIVE mg/dL   Protein, ur 30 (A) NEGATIVE mg/dL   Nitrite NEGATIVE NEGATIVE   Leukocytes, UA TRACE (A) NEGATIVE   RBC / HPF 0-5 0 - 5 RBC/hpf   WBC, UA 6-30 0 - 5 WBC/hpf   Bacteria, UA NONE SEEN NONE SEEN   Hyaline Casts, UA PRESENT   Troponin I  Result Value  Ref Range   Troponin I 0.28 (HH) <0.03 ng/mL  Protime-INR  Result Value Ref Range   Prothrombin Time 18.6 (H) 11.4 - 15.2 seconds   INR 1.54   I-Stat CG4 Lactic Acid, ED  Result Value Ref Range   Lactic Acid, Venous 2.88 (HH) 0.5 - 1.9 mmol/L  CBG monitoring, ED  Result Value Ref Range   Glucose-Capillary 185 (H) 65 - 99 mg/dL    Dg Chest Port 1 View Result Date: 07-02-16 CLINICAL DATA:  Fever, UTI EXAM: PORTABLE CHEST 1 VIEW COMPARISON:  05/17/2016 FINDINGS: Small left pleural effusion. Trace right pleural effusion. Bilateral interstitial thickening. No pneumothorax. Stable cardiomegaly. Prior CABG. Prior TAVR. Cardiac pacemaker noted. No acute osseous injury. IMPRESSION: 1. Mild CHF. Electronically Signed   By: Elige Ko   On: 07/02/16 19:22    2110:  Rectal temp 104; code sepsis called. IV abx started after [facility] and UC obtained. BP stable. Judicious IVF given for mildly elevated BUN/Cr and lactic acid due to CHF on CXR. Troponin elevated, but lower than last month. APAP given for fever (4 hours after LD) with slow decrease; cooling blanket applied. T/C to [facility]. Ophelia Charter, case discussed, including:  HPI, pertinent PM/SHx, VS/PE, dx testing, ED course and treatment:  Agreeable to admit, requests to write temporary orders, obtain stepdown bed to team APAdmits.    Final Clinical Impressions(s) / ED Diagnoses   Final diagnoses:  None    New Prescriptions New Prescriptions   No medications on file     Samuel Jester, DO 06/15/16 2248

## 2016-06-14 ENCOUNTER — Inpatient Hospital Stay (HOSPITAL_COMMUNITY): Payer: Medicare Other

## 2016-06-14 DIAGNOSIS — R740 Nonspecific elevation of levels of transaminase and lactic acid dehydrogenase [LDH]: Secondary | ICD-10-CM

## 2016-06-14 DIAGNOSIS — N390 Urinary tract infection, site not specified: Secondary | ICD-10-CM

## 2016-06-14 DIAGNOSIS — B961 Klebsiella pneumoniae [K. pneumoniae] as the cause of diseases classified elsewhere: Secondary | ICD-10-CM

## 2016-06-14 LAB — GLUCOSE, CAPILLARY
GLUCOSE-CAPILLARY: 119 mg/dL — AB (ref 65–99)
GLUCOSE-CAPILLARY: 163 mg/dL — AB (ref 65–99)
GLUCOSE-CAPILLARY: 96 mg/dL (ref 65–99)
GLUCOSE-CAPILLARY: 213 mg/dL — AB (ref 65–99)
GLUCOSE-CAPILLARY: 118 mg/dL — AB (ref 65–99)

## 2016-06-14 LAB — BLOOD CULTURE ID PANEL (REFLEXED)
Acinetobacter baumannii: NOT DETECTED
CANDIDA GLABRATA: NOT DETECTED
CANDIDA TROPICALIS: NOT DETECTED
Candida albicans: NOT DETECTED
Candida krusei: NOT DETECTED
Candida parapsilosis: NOT DETECTED
ENTEROBACTER CLOACAE COMPLEX: NOT DETECTED
Enterobacteriaceae species: NOT DETECTED
Enterococcus species: NOT DETECTED
Escherichia coli: NOT DETECTED
Haemophilus influenzae: NOT DETECTED
KLEBSIELLA PNEUMONIAE: NOT DETECTED
Klebsiella oxytoca: NOT DETECTED
Listeria monocytogenes: NOT DETECTED
NEISSERIA MENINGITIDIS: NOT DETECTED
PROTEUS SPECIES: NOT DETECTED
PSEUDOMONAS AERUGINOSA: NOT DETECTED
SERRATIA MARCESCENS: NOT DETECTED
STAPHYLOCOCCUS AUREUS BCID: NOT DETECTED
STAPHYLOCOCCUS SPECIES: NOT DETECTED
STREPTOCOCCUS AGALACTIAE: NOT DETECTED
STREPTOCOCCUS PNEUMONIAE: NOT DETECTED
Streptococcus pyogenes: NOT DETECTED
Streptococcus species: NOT DETECTED

## 2016-06-14 LAB — MRSA PCR SCREENING: MRSA BY PCR: NEGATIVE

## 2016-06-14 LAB — BASIC METABOLIC PANEL
Anion gap: 9 (ref 5–15)
BUN: 46 mg/dL — AB (ref 6–20)
CALCIUM: 8.2 mg/dL — AB (ref 8.9–10.3)
CO2: 23 mmol/L (ref 22–32)
CREATININE: 2.04 mg/dL — AB (ref 0.61–1.24)
Chloride: 100 mmol/L — ABNORMAL LOW (ref 101–111)
GFR calc Af Amer: 32 mL/min — ABNORMAL LOW (ref >60)
GFR, EST NON AFRICAN AMERICAN: 28 mL/min — AB (ref >60)
GLUCOSE: 196 mg/dL — AB (ref 65–99)
Potassium: 4.3 mmol/L (ref 3.5–5.1)
Sodium: 132 mmol/L — ABNORMAL LOW (ref 135–145)

## 2016-06-14 LAB — CBC
HCT: 27.6 % — ABNORMAL LOW (ref 39.0–52.0)
Hemoglobin: 9.3 g/dL — ABNORMAL LOW (ref 13.0–17.0)
MCH: 31.6 pg (ref 26.0–34.0)
MCHC: 33.7 g/dL (ref 30.0–36.0)
MCV: 93.9 fL (ref 78.0–100.0)
Platelets: 102 10*3/uL — ABNORMAL LOW (ref 150–400)
RBC: 2.94 MIL/uL — ABNORMAL LOW (ref 4.22–5.81)
RDW: 15 % (ref 11.5–15.5)
WBC: 9 10*3/uL (ref 4.0–10.5)

## 2016-06-14 LAB — LACTIC ACID, PLASMA
LACTIC ACID, VENOUS: 1.4 mmol/L (ref 0.5–1.9)
LACTIC ACID, VENOUS: 1 mmol/L (ref 0.5–1.9)

## 2016-06-14 LAB — HEPATIC FUNCTION PANEL
ALT: 79 U/L — ABNORMAL HIGH (ref 17–63)
AST: 104 U/L — ABNORMAL HIGH (ref 15–41)
Albumin: 3.1 g/dL — ABNORMAL LOW (ref 3.5–5.0)
Alkaline Phosphatase: 70 U/L (ref 38–126)
BILIRUBIN DIRECT: 0.1 mg/dL (ref 0.1–0.5)
BILIRUBIN INDIRECT: 0.4 mg/dL (ref 0.3–0.9)
TOTAL PROTEIN: 6.1 g/dL — AB (ref 6.5–8.1)
Total Bilirubin: 0.5 mg/dL (ref 0.3–1.2)

## 2016-06-14 LAB — URINE CULTURE: Culture: >=100000 — AB

## 2016-06-14 LAB — PROCALCITONIN: PROCALCITONIN: 18.51 ng/mL

## 2016-06-14 LAB — TROPONIN I
TROPONIN I: 0.22 ng/mL — AB (ref <0.03)
TROPONIN I: 0.35 ng/mL — AB (ref <0.03)
TROPONIN I: 0.48 ng/mL — AB (ref <0.03)

## 2016-06-14 NOTE — Care Management Important Message (Signed)
Important Message  Patient Details  Name: Melvin [name] MRN: [id number] Date of Birth: 1927/12/17   Medicare Important Message Given:  Yes    Malcolm Metrohildress, Allani Reber Demske, RN 06/14/2016, 2:20 PM

## 2016-06-14 NOTE — Progress Notes (Signed)
Donnamarie PoagK. Kirby notified of troponin that is currently trending up.

## 2016-06-14 NOTE — Progress Notes (Addendum)
PROGRESS NOTE                                                                                                                                                                                                             Patient Demographics:    Melvin [name], is a 82 y.o. male, DOB - February 18, 1928, ZOX:[id number]  Admit date - 06/25/2016   Admitting Physician Jonah Blue, MD  Outpatient Primary MD for the patient is Colette Ribas, MD  LOS - 1  Outpatient Specialists: [facility] cardiology EP: Dr Ladona Ridgel  Chief Complaint  Patient presents with  . Urinary Tract Infection  . Fever       Brief Narrative   82 year old male with extensive cardiac disease including CAD status post remote CABG last PCI in April 2016, ischemic cardiomyopathy ( last EF of 30-35%), PAD with prior interventions, LBBB, aortic stenosis status post TAVR on 10/24, implantation of CRT-PPM on 11/9 for  tachybradycardia syndrome, insulin-dependent diabetes mellitus, I per lipidemia, hypertension who was discharged to pain Center for rehabilitation after recent pacemaker placement. Patient was tolerating rehabilitation well and was seen on 12/5 with acute urinary symptoms with fevers and chills. Started on Bactrim DS for UTI. Culture grew Klebsiella ornitholytica. He was discharged home the next day. However after getting home he was feeling increasingly weak and had a fever of 103F at home. Also had chills. In the ED he was septic with fever of 104.35F, hypotensive with blood pressure 97/54 mmHg. blood work showed WBC of 9, hemoglobin 9.3, platelets of 102, sodium 132, chloride 100, worsened renal function with BUN of 46 and creatinine of 2.04, elevated troponin of 0.35 lactic acid of 2.88. procalcitonin was 18.5. LFTs showed mild transaminitis. Chest x-ray showed findings of mild CHF.     Subjective:   Patient denies any specific symptom. Denies  shortness of breath or dysuria. Denies any flank pain.   Assessment  & Plan :    Principal Problem:   Sepsis (HCC) Suspected due to UTI. Cultures growing Klebsiella ornitholytica, sensitivity pending. Code sepsis initiated. Received limited IV fluid bolus (500 mL given cardiomyopathy) and then placed on empiric IV vancomycin and Zosyn. Subsequently lactic acid normalized. Blood pressure has been stable. patient remains afebrile. -Follow blood cultures and final urine culture results. -Supportive care with Tylenol. -Continue step down monitoring.  Active  Problems: Acute kidney injury -Suspected due to sepsis with UTI. Also being on lisinopril, metformin, torsemide and recently started on Bactrim has caused further worsening. Discontinued all offending agents. No signs of volume overload clinically although has bilateral pitting leg edema. -Will monitor off IV fluids. Check renal ultrasound and daily labs.   Hypotension Continue beta blocker. Hold diuretic,ACE inhibitor and amlodipine. Blood pressure stable this morning.   CAD with history of CABG/ischemic cardiomyopathy Monitor strict I/O and daily weight. Continue aspirin,  and metoprolol.    Non-insulin dependent type 2 diabetes mellitus (HCC) Hold  metformin. Monitor on sliding scale coverage. A1C of 7.1    Persistent atrial fibrillation (HCC) Rate controlled. Continue metoprolol, amiodarone and Eliquis  Elevated troponin Suspected due to demand ischemia with sepsis. Subsequent troponin trending down. No chest pain symptoms or EKG changes. Monitor on telemetry.  Transaminitis Suspected due to sepsis +/- hepatic congestion. Hold statin and niacin. Hold gabapentin. Monitor daily. Check abdominal ultrasound to evaluate liver and kidney.  Thrombocytopenia Suspected due to sepsis. Monitor closely while on anticoagulation.  Anemia  drop in hemoglobin from baseline. Check stool for Hemoccult. Monitor daily.  Given his extensive  cardiac history with a recent TAVR and PPM placement underlying severe cardiomyopathy patient is at risk of having cardiac on medications with sepsis. Monitor closely in ICU. Low threshold [facility] issues (no cardiology service over the weekend)        Code Status : Full code  Family Communication  : None at bedside  Disposition Plan  : Continue ICU monitoring  Barriers For Discharge : Active symptoms  Consults  :  None  Procedures  : Ultrasound abdomen  DVT Prophylaxis  : Eliquis  Lab Results  Component Value Date   PLT 102 (L) 06/14/2016    Antibiotics  :    Anti-infectives    Start     Dose/Rate Route Frequency Ordered Stop   06/14/16 2000  vancomycin (VANCOCIN) 500 mg in sodium chloride 0.9 % 100 mL IVPB     500 mg 100 mL/hr over 60 Minutes Intravenous Every 24 hours 06/28/2016 1929     06/14/16 0400  piperacillin-tazobactam (ZOSYN) IVPB 3.375 g     3.375 g 12.5 mL/hr over 240 Minutes Intravenous Every 8 hours 06/29/2016 1929     06/29/2016 1845  piperacillin-tazobactam (ZOSYN) IVPB 3.375 g     3.375 g 100 mL/hr over 30 Minutes Intravenous  Once 06/28/2016 1842 06/14/2016 2016   07/06/2016 1845  vancomycin (VANCOCIN) IVPB 1000 mg/200 mL premix     1,000 mg 200 mL/hr over 60 Minutes Intravenous  Once 06/24/2016 1842 06/24/2016 2101        Objective:   Vitals:   06/14/16 0500 06/14/16 0600 06/14/16 1005 06/14/16 1006  BP: (!) 97/54 (!) 103/47 (!) 111/54 (!) 111/54  Pulse: (!) 59 (!) 59 63 67  Resp: 17 19  20   Temp:    97.7 F (36.5 C)  TempSrc:    Oral  SpO2: 96% 96%  98%  Weight: 78.2 kg (172 lb 6.4 oz)     Height:        Wt Readings from Last 3 Encounters:  06/14/16 78.2 kg (172 lb 6.4 oz)  06/12/16 74.4 kg (164 lb)  05/29/16 73 kg (161 lb)     Intake/Output Summary (Last 24 hours) at 06/14/16 1346 Last data filed at 06/14/16 1000  Gross per 24 hour  Intake  878.33 ml  Output              680 ml    Net           198.33 ml     Physical Exam  Gen: not in distress HEENT: no pallor, moist mucosa, supple neck Chest: clear b/l, no added sounds CVS:  S1&S2 irregular , systolic Murmur 3/6 GI: soft, NT, ND, BS+ Musculoskeletal: warm, 2+ pitting edema bilaterally with some weeping on the left leg CNS: Alert and oriented    Data Review:    CBC  Recent Labs Lab 06/26/2016 1940 06/14/16 0235  WBC 10.2 9.0  HGB 10.5* 9.3*  HCT 31.6* 27.6*  PLT 103* 102*  MCV 94.3 93.9  MCH 31.3 31.6  MCHC 33.2 33.7  RDW 15.0 15.0  LYMPHSABS 0.4*  --   MONOABS 1.2*  --   EOSABS 0.0  --   BASOSABS 0.0  --     Chemistries   Recent Labs Lab 06/12/16 0700 2016/06/26 1940 06/14/16 0235  NA 136 131* 132*  K 4.2 4.7 4.3  CL 96* 95* 100*  CO2 31 25 23   GLUCOSE 134* 191* 196*  BUN 43* 48* 46*  CREATININE 1.75* 2.25* 2.04*  CALCIUM 9.3 9.0 8.2*  AST  --  121* 104*  ALT  --  74* 79*  ALKPHOS  --  80 70  BILITOT  --  0.6 0.5   ------------------------------------------------------------------------------------------------------------------ No results for input(s): CHOL, HDL, LDLCALC, TRIG, CHOLHDL, LDLDIRECT in the last 72 hours.  Lab Results  Component Value Date   HGBA1C 7.1 (H) 04/26/2016   ------------------------------------------------------------------------------------------------------------------ No results for input(s): TSH, T4TOTAL, T3FREE, THYROIDAB in the last 72 hours.  Invalid input(s): FREET3 ------------------------------------------------------------------------------------------------------------------ No results for input(s): VITAMINB12, FOLATE, FERRITIN, TIBC, IRON, RETICCTPCT in the last 72 hours.  Coagulation profile  Recent Labs Lab 06/26/16 2022 06-26-2016 2324  INR 1.54 1.54    No results for input(s): DDIMER in the last 72 hours.  Cardiac Enzymes  Recent Labs Lab 06/26/16 2336 06/14/16 0510 06/14/16 1117  TROPONINI 0.48* 0.35* 0.22*    ------------------------------------------------------------------------------------------------------------------    Component Value Date/Time   BNP 151.5 (H) 05/16/2016 0446   BNP 299.6 (H) 01/30/2016 1518    Inpatient Medications  Scheduled Meds: . amiodarone  200 mg Oral Daily  . amLODipine  10 mg Oral Daily  . apixaban  5 mg Oral BID  . aspirin  81 mg Oral QPM  . atorvastatin  40 mg Oral q1800  . feeding supplement (ENSURE ENLIVE)  237 mL Oral BID BM  . gabapentin  300 mg Oral QHS  . insulin aspart  0-9 Units Subcutaneous TID WC  . metoprolol  50 mg Oral BID  . niacin  1,500 mg Oral QHS  . piperacillin-tazobactam (ZOSYN)  IV  3.375 g Intravenous Q8H  . sodium chloride flush  3 mL Intravenous Q12H  . vancomycin  500 mg Intravenous Q24H   Continuous Infusions: . sodium chloride 1,000 mL (06/14/16 0930)   PRN Meds:.acetaminophen, ondansetron **OR** ondansetron (ZOFRAN) IV  Micro Results Recent Results (from the past 240 hour(s))  Urine culture     Status: Abnormal   Collection Time: 06/11/16 11:10 AM  Result Value Ref Range Status   Specimen Description URINE, CLEAN CATCH  Final   Special Requests NONE  Final   Culture >=100,000 COLONIES/mL KLEBSIELLA ORNITHINOLYTICA (A)  Final   Report Status 06/14/2016 FINAL  Final   Organism ID, Bacteria KLEBSIELLA ORNITHINOLYTICA (A)  Final  Susceptibility   Klebsiella ornithinolytica - MIC*    AMPICILLIN >=32 RESISTANT Resistant     CEFAZOLIN <=4 SENSITIVE Sensitive     CEFTRIAXONE <=1 SENSITIVE Sensitive     CIPROFLOXACIN <=0.25 SENSITIVE Sensitive     GENTAMICIN <=1 SENSITIVE Sensitive     IMIPENEM 0.5 SENSITIVE Sensitive     NITROFURANTOIN <=16 SENSITIVE Sensitive     TRIMETH/SULFA <=20 SENSITIVE Sensitive     AMPICILLIN/SULBACTAM 4 SENSITIVE Sensitive     PIP/TAZO <=4 SENSITIVE Sensitive     * >=100,000 COLONIES/mL KLEBSIELLA ORNITHINOLYTICA  Culture, blood (Routine x 2)     Status: None (Preliminary result)    Collection Time: 07/03/2016  7:40 PM  Result Value Ref Range Status   Specimen Description LEFT ANTECUBITAL  Final   Special Requests BOTTLES DRAWN AEROBIC AND ANAEROBIC 6CC  Final   Culture NO GROWTH < 24 HOURS  Final   Report Status PENDING  Incomplete  Culture, blood (Routine x 2)     Status: None (Preliminary result)   Collection Time: 07/01/2016 10:50 PM  Result Value Ref Range Status   Specimen Description BLOOD LEFT FOREARM  Final   Special Requests   Final    BOTTLES DRAWN AEROBIC AND ANAEROBIC AEB 8CC ANA 6CC   Culture NO GROWTH < 24 HOURS  Final   Report Status PENDING  Incomplete  MRSA PCR Screening     Status: None   Collection Time: 07/04/2016 11:36 PM  Result Value Ref Range Status   MRSA by PCR NEGATIVE NEGATIVE Final    Comment:        The GeneXpert MRSA Assay (FDA approved for NASAL specimens only), is one component of a comprehensive MRSA colonization surveillance program. It is not intended to diagnose MRSA infection nor to guide or monitor treatment for MRSA infections.     Radiology Reports Dg Chest 2 View  Result Date: 05/17/2016 CLINICAL DATA:  Pacemaker EXAM: CHEST  2 VIEW COMPARISON:  Yesterday FINDINGS: New biventricular ICD/ pacer. Leads overlap the right atrial appendage, right ventricle, and left ventricle. Status post transcatheter aortic valve replacement. The aorta is tortuous, with low and rightward positioning of CABG ostial markers. Chronic elevation of the left diaphragm. Pulmonary edema is resolved. No pneumothorax. IMPRESSION: No acute finding after biventricular pacer implant. Resolved pulmonary edema. Electronically Signed   By: Marnee Spring M.D.   On: 05/17/2016 08:01   Dg Chest Port 1 View  Result Date: 06/09/2016 CLINICAL DATA:  Fever, UTI EXAM: PORTABLE CHEST 1 VIEW COMPARISON:  05/17/2016 FINDINGS: Small left pleural effusion. Trace right pleural effusion. Bilateral interstitial thickening. No pneumothorax. Stable cardiomegaly. Prior  CABG. Prior TAVR. Cardiac pacemaker noted. No acute osseous injury. IMPRESSION: 1. Mild CHF. Electronically Signed   By: Elige Ko   On: 06/20/2016 19:22   Dg Chest Portable 1 View  Result Date: 05/16/2016 CLINICAL DATA:  Chest pain and shortness of breath this morning. EXAM: PORTABLE CHEST 1 VIEW COMPARISON:  05/11/2016 FINDINGS: Postoperative changes in the mediastinum. Shallow inspiration. Cardiac enlargement. No pulmonary vascular congestion. Elevation of left hemidiaphragm with linear atelectasis in the lung bases. No focal consolidation. No blunting of costophrenic angles. No pneumothorax. Calcified and tortuous aorta. IMPRESSION: Cardiac enlargement without vascular congestion. Atelectasis in the lung bases with elevation of left hemidiaphragm. No change since prior study. Electronically Signed   By: Burman Nieves M.D.   On: 05/16/2016 05:14    Time Spent in minutes  35   Eddie North M.D on 06/14/2016 at  1:46 PM  Between 7am to 7pm - Pager - [phone]  After 7pm go to [email or web address] - password [email or web address]  [facility] -  Office  [phone]

## 2016-06-14 NOTE — Progress Notes (Signed)
Initial Nutrition Assessment  DOCUMENTATION CODES:  Not applicable  INTERVENTION:  Continue Ensure Enlive BID  NUTRITION DIAGNOSIS:  Increased nutrient needs related to Severe acute illness (very febrile/Sepsis) as evidenced by estimated nutritional requirements for this condition  GOAL:  Patient will meet greater than or equal to 90% of their needs   MONITOR:  PO intake, Supplement acceptance, Labs, Weight trends, I & O's  REASON FOR ASSESSMENT:  Malnutrition Screening Tool    ASSESSMENT:  82 y/o male PMhx CAD s/p CABG, VHD s/p TAVR, PAF, HTN, HLD, DM, CHF, S/P postmaker placement. Recently discharged from SNF. Presented with fever and shakes. Worked up for Sepis and acute renal failure.   Pt was discharged from SNF on 12/6. At time of discharge patient was feeling well w/ exception of dysuria.  The week prior to discharge, pt was eating exceptionally well, 50-100% of Consistent carb, reduced sodium diet.  He received Ensure Enlive 1x/day At SNF was taking Fibercon, MVI, Niacin, KCL, Preservision MVI.   Today, pt reports that he does not have any appetite, though family member at bedside said he sipped on an Ensure today and did fine with that. He mostly is just fatigued. Denies n/v/c/d. He was agreeable to continuing the Ensure [place] while he as admitted.   On discharge from SNF his weight was 162.5 lbs. His weight was 170-175 before he had series of hospital admissions/ED visits in September-November. This is not significant loss for length of time  Physical Exam: He does have some mild-mod muscle wasting, however given his past month of good PO intake, suspect this is more related to age related sarcopenia.   Labs: Albumin: 3.1, BG: 120 now. A1C on 10/20 was 7.1 Medications: Niacin, IV abx, IVF   Recent Labs Lab 06/12/16 0700 06/29/2016 1940 06/14/16 0235  NA 136 131* 132*  K 4.2 4.7 4.3  CL 96* 95* 100*  CO2 31 25 23   BUN 43* 48* 46*  CREATININE 1.75* 2.25* 2.04*   CALCIUM 9.3 9.0 8.2*  GLUCOSE 134* 191* 196*     Diet Order:  Diet NPO time specified Except for: Sips with Meds  Skin: Pale/Dry, Abrasion to bilateral legs  Last BM:  12/6  Height:  Ht Readings from Last 1 Encounters:  06/19/2016 5\' 6"  (1.676 m)   Weight:  Wt Readings from Last 1 Encounters:  06/14/16 172 lb 6.4 oz (78.2 kg)    Wt Readings from Last 10 Encounters:  06/14/16 172 lb 6.4 oz (78.2 kg)  06/12/16 164 lb (74.4 kg)  05/29/16 161 lb (73 kg)  05/21/16 162 lb 3.2 oz (73.6 kg)  05/17/16 169 lb 8 oz (76.9 kg)  05/14/16 166 lb 4.8 oz (75.4 kg)  05/03/16 162 lb 1.6 oz (73.5 kg)  04/26/16 175 lb 1 oz (79.4 kg)  04/17/16 170 lb (77.1 kg)  04/10/16 173 lb (78.5 kg)  [facility]: 162.5 lbs (73.86 kg)  Ideal Body Weight:  64.55 kg  BMI:  Body mass index is 27.83 kg/m.  Estimated Nutritional Needs:  Kcal:  1900-2100 (25-27 kcal/kg bw) Protein:  90-105 g (1.4-1.6 g/kg bw) Fluid:  Per md  EDUCATION NEEDS:  No education needs identified at this time  Christophe LouisNathan Franks RD, LDN, CNSC Clinical Nutrition Pager: [phone] 06/14/2016 12:34 PM

## 2016-06-14 NOTE — Care Management Note (Signed)
Case Management Note  Patient Details  Name: Melvin [name] MRN: [id number] Date of Birth: 11-26-1927  Subjective/Objective:                  Pt admitted with sepsis. [facility] is [facility] Pt lives with his wife and was ind with ADL's. He uses a walker for ambulation and reports no DME needs. He does not wear oxygen PTA. [facility] Pt had not been made active but [facility]. Therisa DoyneKathy Cheek, of [facility], is aware of referral and will obtain pt info from chart.   Action/Plan: Will cont to follow.   Expected Discharge Date:     06/19/2016             Expected Discharge Plan:  Home w Home Health Services  In-House Referral:  NA  Discharge planning Services  CM Consult  Post Acute Care Choice:  Home Health Choice offered to:  Patient  HH Arranged:  RN, [facility]  [facility]  Status of Service:  In process, will continue to follow  Malcolm MetroChildress, Arionne Iams Demske, RN 06/14/2016, 2:21 PM

## 2016-06-15 ENCOUNTER — Inpatient Hospital Stay (HOSPITAL_COMMUNITY): Payer: Medicare Other

## 2016-06-15 LAB — COMPREHENSIVE METABOLIC PANEL
ALBUMIN: 2.7 g/dL — AB (ref 3.5–5.0)
ALT: 69 U/L — ABNORMAL HIGH (ref 17–63)
AST: 84 U/L — AB (ref 15–41)
Alkaline Phosphatase: 60 U/L (ref 38–126)
Anion gap: 6 (ref 5–15)
BILIRUBIN TOTAL: 0.4 mg/dL (ref 0.3–1.2)
BUN: 33 mg/dL — AB (ref 6–20)
CO2: 27 mmol/L (ref 22–32)
Calcium: 8.6 mg/dL — ABNORMAL LOW (ref 8.9–10.3)
Chloride: 107 mmol/L (ref 101–111)
Creatinine, Ser: 1.36 mg/dL — ABNORMAL HIGH (ref 0.61–1.24)
GFR calc Af Amer: 53 mL/min — ABNORMAL LOW (ref >60)
GFR calc non Af Amer: 45 mL/min — ABNORMAL LOW (ref >60)
GLUCOSE: 129 mg/dL — AB (ref 65–99)
POTASSIUM: 4.7 mmol/L (ref 3.5–5.1)
Sodium: 140 mmol/L (ref 135–145)
TOTAL PROTEIN: 5.7 g/dL — AB (ref 6.5–8.1)

## 2016-06-15 LAB — GLUCOSE, CAPILLARY
GLUCOSE-CAPILLARY: 227 mg/dL — AB (ref 65–99)
GLUCOSE-CAPILLARY: 123 mg/dL — AB (ref 65–99)
Glucose-Capillary: 128 mg/dL — ABNORMAL HIGH (ref 65–99)
Glucose-Capillary: 210 mg/dL — ABNORMAL HIGH (ref 65–99)

## 2016-06-15 LAB — CULTURE, BLOOD (ROUTINE X 2)

## 2016-06-15 LAB — CBC
HEMATOCRIT: 28.4 % — AB (ref 39.0–52.0)
HEMOGLOBIN: 9.3 g/dL — AB (ref 13.0–17.0)
MCH: 31.3 pg (ref 26.0–34.0)
MCHC: 32.7 g/dL (ref 30.0–36.0)
MCV: 95.6 fL (ref 78.0–100.0)
Platelets: 104 10*3/uL — ABNORMAL LOW (ref 150–400)
RBC: 2.97 MIL/uL — AB (ref 4.22–5.81)
RDW: 15 % (ref 11.5–15.5)
WBC: 8.4 10*3/uL (ref 4.0–10.5)

## 2016-06-15 MED ORDER — SALINE SPRAY 0.65 % NA SOLN
1.0000 | NASAL | Status: DC | PRN
  Administered 2016-06-16: 1 via NASAL
  Filled 2016-06-15: qty 44

## 2016-06-15 MED ORDER — GUAIFENESIN-DM 100-10 MG/5ML PO SYRP
5.0000 mL | ORAL_SOLUTION | ORAL | Status: DC | PRN
  Administered 2016-06-16 – 2016-06-21 (×6): 5 mL via ORAL
  Filled 2016-06-15 (×6): qty 5

## 2016-06-15 MED ORDER — GABAPENTIN 300 MG PO CAPS
300.0000 mg | ORAL_CAPSULE | Freq: Every day | ORAL | Status: DC
  Administered 2016-06-15 – 2016-06-20 (×5): 300 mg via ORAL
  Filled 2016-06-15 (×5): qty 1

## 2016-06-15 MED ORDER — LEVOFLOXACIN IN D5W 500 MG/100ML IV SOLN
500.0000 mg | INTRAVENOUS | Status: DC
  Administered 2016-06-15 – 2016-06-17 (×3): 500 mg via INTRAVENOUS
  Filled 2016-06-15 (×4): qty 100

## 2016-06-15 MED ORDER — AMLODIPINE BESYLATE 5 MG PO TABS
5.0000 mg | ORAL_TABLET | Freq: Every day | ORAL | Status: DC
  Administered 2016-06-15 – 2016-06-17 (×3): 5 mg via ORAL
  Filled 2016-06-15 (×2): qty 1

## 2016-06-15 NOTE — Progress Notes (Signed)
Pt. To room 326 from ICU.

## 2016-06-15 NOTE — Progress Notes (Signed)
PROGRESS NOTE  Melvin [name]:[id number] DOB: May 02, 1928 DOA: 07/01/2016 PCP: Colette Ribas, MD   LOS: 2 days   Brief Narrative: 82 year old male with extensive cardiac disease including CAD status post remote CABG last PCI in April 2016, ischemic cardiomyopathy ( last EF of 30-35%), PAD with prior interventions, LBBB, aortic stenosis status post TAVR on 10/24, implantation of CRT-PPM on 11/9 for  tachybradycardia syndrome, insulin-dependent diabetes mellitus, I per lipidemia, hypertension who was discharged to pain Center for rehabilitation after recent pacemaker placement. Patient was tolerating rehabilitation well and was seen on 12/5 with acute urinary symptoms with fevers and chills. Started on Bactrim DS for UTI. Culture grew Klebsiella ornitholytica. He was discharged home the next day. However after getting home he was feeling increasingly weak and had a fever of 103F at home. Also had chills. In the ED he was septic with fever of 104.13F, hypotensive with blood pressure 97/54 mmHg. blood work showed WBC of 9, hemoglobin 9.3, platelets of 102, sodium 132, chloride 100, worsened renal function with BUN of 46 and creatinine of 2.04, elevated troponin of 0.35 lactic acid of 2.88. procalcitonin was 18.5. LFTs showed mild transaminitis. Chest x-ray showed findings of mild CHF.  Assessment & Plan: Principal Problem:   Sepsis (HCC) Active Problems:   Hypertension   HLD (hyperlipidemia)   Non-insulin dependent type 2 diabetes mellitus (HCC)   Persistent atrial fibrillation (HCC)   Chronic combined systolic and diastolic CHF (congestive heart failure) (HCC)   Hyponatremia   Acute renal failure (HCC)   Elevated troponin   Anemia   Thrombocytopenia (HCC)   Sepsis (HCC) due to UTI vs HCAP - Suspected due to UTI. Cultures obtained on 12/5 growing Klebsiella ornitholytica, resistant to ampicillin but otherwise sensitive - Sepsis physiology has not resolved - Blood cultures with  1 out of 2 bottles with Diphtheroids (Corynebacterium species), likely contaminant - Discontinue vancomycin and narrow Zosyn to Levaquin to have pneumonia coverage as well - Patient with an episode of hemoptysis yesterday as well as one overnight, antibiotics as above to have HCAP coverage - Clinically improving, transfer to telemetry today  Acute kidney injury - Suspected due to sepsis with UTI. Also being on lisinopril, metformin, torsemide and recently started on Bactrim has caused further worsening. Discontinued all offending agents. - Discontinue IV fluids. - Kidney ultrasound with a 2 cm right lower pole renal cyst, but otherwise unremarkable  Hypotension - Resolved, blood pressure in the 140s to 150s systolic this morning - Continue beta blocker. Hold diuretic, ACE inhibitor and amlodipine. Blood pressure stable this morning. - Resume low-dose amlodipine today  CAD with history of CABG/ischemic cardiomyopathy - Monitor strict I/O and daily weight. Continue aspirin and metoprolol.  Non-insulin dependent type 2 diabetes mellitus (HCC) - Hold  metformin. Monitor on sliding scale coverage. A1C of 7.1  Persistent atrial fibrillation (HCC) - Rate controlled. Continue metoprolol, amiodarone and Eliquis  Elevated troponin - Suspected due to demand ischemia with sepsis. Subsequent troponin trending down. No chest pain symptoms or EKG changes. Monitor on telemetry.  Transaminitis - Suspected due to sepsis +/- hepatic congestion. Hold statin and niacin. Hold gabapentin.  - Improving, right upper quadrant ultrasound with "nonspecific echogenic lesions in the liver, suspect hepatic granulomata versus partially calcified hemangiomas. These have a benign appearance."  Thrombocytopenia - Suspected due to sepsis. Monitor closely while on anticoagulation.  - It appears stable today  Anemia - drop in hemoglobin from baseline, likely decrease in the setting of sepsis, stable at  9.3     Deconditioning - Patient with repeated hospitalizations in the last several months, he feels extremely weak, and tells me his wife cannot care for him at home he does not want to go back home. I consulted Child psychotherapistsocial worker for SNF placement. Physical therapy evaluation pending   DVT prophylaxis: Eliquis Code Status: Full Family Communication: no family bedside Disposition Plan: transfer to telemetry   Consultants:   None   Procedures:   None   Antimicrobials:  Vancomycin 12/7 >>  Zosyn 12/7 >>  Subjective: - no chest pain, no abdominal pain, nausea or vomiting.   Objective: Vitals:   06/15/16 0500 06/15/16 0700 06/15/16 0816 06/15/16 0909  BP:  (!) 178/76 (!) 147/60 (!) 152/60  Pulse: (!) 59 64 62 64  Resp: 20 17 16    Temp:   98.6 F (37 C)   TempSrc:   Oral   SpO2: 99% 96% 96%   Weight:      Height:        Intake/Output Summary (Last 24 hours) at 06/15/16 1018 Last data filed at 06/15/16 0900  Gross per 24 hour  Intake             3035 ml  Output             1310 ml  Net             1725 ml   Filed Weights   06/15/2016 2317 06/14/16 0500 06/15/16 0400  Weight: 78.2 kg (172 lb 6.4 oz) 78.2 kg (172 lb 6.4 oz) 78.5 kg (173 lb 1 oz)    Examination: Constitutional: NAD Vitals:   06/15/16 0500 06/15/16 0700 06/15/16 0816 06/15/16 0909  BP:  (!) 178/76 (!) 147/60 (!) 152/60  Pulse: (!) 59 64 62 64  Resp: 20 17 16    Temp:   98.6 F (37 C)   TempSrc:   Oral   SpO2: 99% 96% 96%   Weight:      Height:       Eyes: PERRL, lids and conjunctivae normal Respiratory: clear to auscultation bilaterally, no wheezing, no crackles. Normal respiratory effort.  Cardiovascular: irregular, no murmurs / rubs / gallops. 2+ LE edema. 2+ pedal pulses.  Abdomen: no tenderness. Bowel sounds positive.  Skin: no rashes, lesions, ulcers. No induration Neurologic:  no focal findings  Psychiatric: Normal judgment and insight. Alert and oriented x 3. Normal mood.    Data  Reviewed: I have personally reviewed following labs and imaging studies  CBC:  Recent Labs Lab 06/26/2016 1940 06/14/16 0235 06/15/16 0444  WBC 10.2 9.0 8.4  NEUTROABS 8.5*  --   --   HGB 10.5* 9.3* 9.3*  HCT 31.6* 27.6* 28.4*  MCV 94.3 93.9 95.6  PLT 103* 102* 104*   Basic Metabolic Panel:  Recent Labs Lab 06/12/16 0700 06/12/2016 1940 06/14/16 0235 06/15/16 0444  NA 136 131* 132* 140  K 4.2 4.7 4.3 4.7  CL 96* 95* 100* 107  CO2 31 25 23 27   GLUCOSE 134* 191* 196* 129*  BUN 43* 48* 46* 33*  CREATININE 1.75* 2.25* 2.04* 1.36*  CALCIUM 9.3 9.0 8.2* 8.6*   GFR: Estimated Creatinine Clearance: 38.4 mL/min (by C-G formula based on SCr of 1.36 mg/dL (H)). Liver Function Tests:  Recent Labs Lab 06/11/2016 1940 06/14/16 0235 06/15/16 0444  AST 121* 104* 84*  ALT 74* 79* 69*  ALKPHOS 80 70 60  BILITOT 0.6 0.5 0.4  PROT 7.0 6.1* 5.7*  ALBUMIN 3.6 3.1*  2.7*   No results for input(s): LIPASE, AMYLASE in the last 168 hours. No results for input(s): AMMONIA in the last 168 hours. Coagulation Profile:  Recent Labs Lab 06/20/2016 2022 07/02/2016 2324  INR 1.54 1.54   Cardiac Enzymes:  Recent Labs Lab 06/07/2016 1940 06/17/2016 2336 06/14/16 0510 06/14/16 1117  TROPONINI 0.28* 0.48* 0.35* 0.22*   BNP (last 3 results) No results for input(s): PROBNP in the last 8760 hours. HbA1C: No results for input(s): HGBA1C in the last 72 hours. CBG:  Recent Labs Lab 06/14/16 1148 06/14/16 1540 06/14/16 1717 06/14/16 2234 06/15/16 0827  GLUCAP 163* 96 213* 118* 128*   Lipid Profile: No results for input(s): CHOL, HDL, LDLCALC, TRIG, CHOLHDL, LDLDIRECT in the last 72 hours. Thyroid Function Tests: No results for input(s): TSH, T4TOTAL, FREET4, T3FREE, THYROIDAB in the last 72 hours. Anemia Panel: No results for input(s): VITAMINB12, FOLATE, FERRITIN, TIBC, IRON, RETICCTPCT in the last 72 hours. Urine analysis:    Component Value Date/Time   COLORURINE YELLOW 07/06/2016  1831   APPEARANCEUR CLEAR 06/27/2016 1831   LABSPEC 1.012 06/12/2016 1831   PHURINE 5.0 07/06/2016 1831   GLUCOSEU NEGATIVE 06/20/2016 1831   HGBUR NEGATIVE 06/20/2016 1831   BILIRUBINUR NEGATIVE 06/23/2016 1831   KETONESUR NEGATIVE 06/23/2016 1831   PROTEINUR 30 (A) 07/03/2016 1831   NITRITE NEGATIVE 06/10/2016 1831   LEUKOCYTESUR TRACE (A) 06/29/2016 1831   Sepsis Labs: Invalid input(s): PROCALCITONIN, LACTICIDVEN  Recent Results (from the past 240 hour(s))  Urine culture     Status: Abnormal   Collection Time: 06/11/16 11:10 AM  Result Value Ref Range Status   Specimen Description URINE, CLEAN CATCH  Final   Special Requests NONE  Final   Culture >=100,000 COLONIES/mL KLEBSIELLA ORNITHINOLYTICA (A)  Final   Report Status 06/14/2016 FINAL  Final   Organism ID, Bacteria KLEBSIELLA ORNITHINOLYTICA (A)  Final      Susceptibility   Klebsiella ornithinolytica - MIC*    AMPICILLIN >=32 RESISTANT Resistant     CEFAZOLIN <=4 SENSITIVE Sensitive     CEFTRIAXONE <=1 SENSITIVE Sensitive     CIPROFLOXACIN <=0.25 SENSITIVE Sensitive     GENTAMICIN <=1 SENSITIVE Sensitive     IMIPENEM 0.5 SENSITIVE Sensitive     NITROFURANTOIN <=16 SENSITIVE Sensitive     TRIMETH/SULFA <=20 SENSITIVE Sensitive     AMPICILLIN/SULBACTAM 4 SENSITIVE Sensitive     PIP/TAZO <=4 SENSITIVE Sensitive     * >=100,000 COLONIES/mL KLEBSIELLA ORNITHINOLYTICA  Culture, blood (Routine x 2)     Status: Abnormal   Collection Time: 06/08/2016  7:40 PM  Result Value Ref Range Status   Specimen Description LEFT ANTECUBITAL  Final   Special Requests BOTTLES DRAWN AEROBIC AND ANAEROBIC 6CC  Final   Culture  Setup Time   Final    GRAM POSITIVE RODS IN BOTH AEROBIC AND ANAEROBIC BOTTLES Organism ID to follow CRITICAL RESULT CALLED TO, READ BACK BY AND VERIFIED WITH: Milus MallickS GAMMONS RN [id number] 06/14/16 A BROWNING    Culture (A)  Final    DIPHTHEROIDS(CORYNEBACTERIUM SPECIES) Standardized susceptibility testing for this organism is  not available. [facility]    Report Status 06/15/2016 FINAL  Final  Blood Culture ID Panel (Reflexed)     Status: None   Collection Time: 06/12/2016  7:40 PM  Result Value Ref Range Status   Enterococcus species NOT DETECTED NOT DETECTED Final   Listeria monocytogenes NOT DETECTED NOT DETECTED Final   Staphylococcus species NOT DETECTED NOT DETECTED Final  Staphylococcus aureus NOT DETECTED NOT DETECTED Final   Streptococcus species NOT DETECTED NOT DETECTED Final   Streptococcus agalactiae NOT DETECTED NOT DETECTED Final   Streptococcus pneumoniae NOT DETECTED NOT DETECTED Final   Streptococcus pyogenes NOT DETECTED NOT DETECTED Final   Acinetobacter baumannii NOT DETECTED NOT DETECTED Final   Enterobacteriaceae species NOT DETECTED NOT DETECTED Final   Enterobacter cloacae complex NOT DETECTED NOT DETECTED Final   Escherichia coli NOT DETECTED NOT DETECTED Final   Klebsiella oxytoca NOT DETECTED NOT DETECTED Final   Klebsiella pneumoniae NOT DETECTED NOT DETECTED Final   Proteus species NOT DETECTED NOT DETECTED Final   Serratia marcescens NOT DETECTED NOT DETECTED Final   Haemophilus influenzae NOT DETECTED NOT DETECTED Final   Neisseria meningitidis NOT DETECTED NOT DETECTED Final   Pseudomonas aeruginosa NOT DETECTED NOT DETECTED Final   Candida albicans NOT DETECTED NOT DETECTED Final   Candida glabrata NOT DETECTED NOT DETECTED Final   Candida krusei NOT DETECTED NOT DETECTED Final   Candida parapsilosis NOT DETECTED NOT DETECTED Final   Candida tropicalis NOT DETECTED NOT DETECTED Final    Comment: Performed at [facility]  Culture, blood (Routine x 2)     Status: None (Preliminary result)   Collection Time: 2016/07/11 10:50 PM  Result Value Ref Range Status   Specimen Description BLOOD LEFT FOREARM  Final   Special Requests   Final    BOTTLES DRAWN AEROBIC AND ANAEROBIC AEB 8CC ANA 6CC   Culture NO GROWTH 2 DAYS  Final   Report Status PENDING   Incomplete  MRSA PCR Screening     Status: None   Collection Time: 2016/07/11 11:36 PM  Result Value Ref Range Status   MRSA by PCR NEGATIVE NEGATIVE Final    Comment:        The GeneXpert MRSA Assay (FDA approved for NASAL specimens only), is one component of a comprehensive MRSA colonization surveillance program. It is not intended to diagnose MRSA infection nor to guide or monitor treatment for MRSA infections.     Radiology Studies: US Abdomen Complete  Result Date: 06/15/2016 CLINICAL DATA:  Renal failure EXAM: ABDOMEN ULTRASOUND COMPLETE COMPARISON:  04/15/2016 FINDINGS: Gallbladder: Hypoechoic intraluminal sludge layering in the gallbladder. No definite gallstones. Wall thickness measures 2.9 mm. No Murphy's sign elicited or visualized pericholecystic fluid. Common bile duct: Diameter: 4 mm Liver: Background echogenicity is normal. Mild nodularity to the liver surface suggesting a component of cirrhosis. Patent hepatic and portal veins. Scattered echogenic lesions in the liver (2 visualized). Largest echogenic lesion has associated posterior acoustic shadowing compatible with calcification measuring 2.0 x 2.3 x 1.9 cm. This could represent complex calcified cyst, granulomata versus partially calcified hemangioma. IVC: No abnormality visualized. Pancreas: Visualized portion unremarkable. Spleen: Limited visualization because of bowel gas appear Right Kidney: Length: 10.3 cm. Slight increased echogenicity and cortical thinning. No hydronephrosis. Minimally complex hypoechoic cyst in the lower pole measures 2 cm. Left Kidney: Length: 10.0 cm. Normal echogenicity and preserved cortex. No hydronephrosis. Limited visualization because of bowel gas. Abdominal aorta: Obscured by bowel gas. Other findings: No free fluid. IMPRESSION: Gallbladder sludge without cholelithiasis or signs of cholecystitis. No biliary dilatation Nonspecific echogenic lesions in the liver, suspect hepatic granulomata versus  partially calcified hemangiomas. These have a benign appearance. 2 cm right lower pole renal cyst Obscured aorta by bowel gas No free fluid or acute process by ultrasound Electronically Signed   By: Judie Petit.  Shick M.D.   On: 06/15/2016 10:05   Dg Chest  Port 1 View  Result Date: 06/15/2016 CLINICAL DATA:  Hemoptysis. EXAM: PORTABLE CHEST 1 VIEW COMPARISON:  06/08/2016 and chest CT 04/15/2016 FINDINGS: Left-sided pacemaker and sternotomy wires unchanged. Patient is rotated to the left. There is moderate stable elevation of the left hemidiaphragm. Mild opacification adjacent the left hemidiaphragm which may be due to atelectasis. The cannot exclude a small amount left pleural fluid. There is hazy patchy opacification of the right mid to lower lung which may be due to asymmetric edema versus infection. Stable cardiomegaly. Remainder of the exam is unchanged. IMPRESSION: Mild opacification adjacent the elevated left hemidiaphragm suggesting atelectasis. Patchy hazy opacification over the right mid to lower lung which may be due to asymmetric edema versus infection. Possible small amount left pleural fluid. Stable cardiomegaly. Electronically Signed   By: Elberta Fortis M.D.   On: 06/15/2016 09:09   Dg Chest Port 1 View  Result Date: 06/30/2016 CLINICAL DATA:  Fever, UTI EXAM: PORTABLE CHEST 1 VIEW COMPARISON:  05/17/2016 FINDINGS: Small left pleural effusion. Trace right pleural effusion. Bilateral interstitial thickening. No pneumothorax. Stable cardiomegaly. Prior CABG. Prior TAVR. Cardiac pacemaker noted. No acute osseous injury. IMPRESSION: 1. Mild CHF. Electronically Signed   By: Elige Ko   On: 06/27/2016 19:22   Scheduled Meds: . amiodarone  200 mg Oral Daily  . apixaban  5 mg Oral BID  . aspirin  81 mg Oral QPM  . feeding supplement (ENSURE ENLIVE)  237 mL Oral BID BM  . insulin aspart  0-9 Units Subcutaneous TID WC  . metoprolol  50 mg Oral BID  . piperacillin-tazobactam (ZOSYN)  IV  3.375 g  Intravenous Q8H  . sodium chloride flush  3 mL Intravenous Q12H  . vancomycin  500 mg Intravenous Q24H   Continuous Infusions:  Pamella Pert, MD, PhD [facility] Pager [phone]  If 7PM-7AM, please contact night-coverage [email or web address] Password [email or web address] 06/15/2016, 10:18 AM

## 2016-06-15 NOTE — Progress Notes (Signed)
CXR and Abd ultrasound completed at bedside, Patient transferred to unit 300. Norm ParcelShannon Dionis Autry, RN 06/15/2016 9:57 AM

## 2016-06-16 LAB — COMPREHENSIVE METABOLIC PANEL
ALK PHOS: 66 U/L (ref 38–126)
ALT: 64 U/L — AB (ref 17–63)
AST: 61 U/L — AB (ref 15–41)
Albumin: 2.7 g/dL — ABNORMAL LOW (ref 3.5–5.0)
Anion gap: 7 (ref 5–15)
BUN: 17 mg/dL (ref 6–20)
CALCIUM: 8.9 mg/dL (ref 8.9–10.3)
CHLORIDE: 108 mmol/L (ref 101–111)
CO2: 26 mmol/L (ref 22–32)
Creatinine, Ser: 0.97 mg/dL (ref 0.61–1.24)
GFR calc Af Amer: >60 mL/min (ref >60)
Glucose, Bld: 159 mg/dL — ABNORMAL HIGH (ref 65–99)
Potassium: 4.2 mmol/L (ref 3.5–5.1)
Sodium: 141 mmol/L (ref 135–145)
Total Bilirubin: 0.4 mg/dL (ref 0.3–1.2)
Total Protein: 5.8 g/dL — ABNORMAL LOW (ref 6.5–8.1)

## 2016-06-16 LAB — GLUCOSE, CAPILLARY
GLUCOSE-CAPILLARY: 165 mg/dL — AB (ref 65–99)
Glucose-Capillary: 150 mg/dL — ABNORMAL HIGH (ref 65–99)
Glucose-Capillary: 174 mg/dL — ABNORMAL HIGH (ref 65–99)
Glucose-Capillary: 156 mg/dL — ABNORMAL HIGH (ref 65–99)

## 2016-06-16 LAB — CBC
HCT: 30.2 % — ABNORMAL LOW (ref 39.0–52.0)
Hemoglobin: 9.8 g/dL — ABNORMAL LOW (ref 13.0–17.0)
MCH: 31.4 pg (ref 26.0–34.0)
MCHC: 32.5 g/dL (ref 30.0–36.0)
MCV: 96.8 fL (ref 78.0–100.0)
PLATELETS: 117 10*3/uL — AB (ref 150–400)
RBC: 3.12 MIL/uL — ABNORMAL LOW (ref 4.22–5.81)
RDW: 15 % (ref 11.5–15.5)
WBC: 8.5 10*3/uL (ref 4.0–10.5)

## 2016-06-16 LAB — URINE CULTURE: Culture: 50000 — AB

## 2016-06-16 MED ORDER — FUROSEMIDE 10 MG/ML IJ SOLN
40.0000 mg | Freq: Once | INTRAMUSCULAR | Status: AC
  Administered 2016-06-16: 40 mg via INTRAVENOUS
  Filled 2016-06-16: qty 4

## 2016-06-16 NOTE — Progress Notes (Signed)
Pt.'s temp 101.6 oral. MD notified, Tylenol given and Will recheck temp. In 1-2 hours.

## 2016-06-16 NOTE — Progress Notes (Signed)
PROGRESS NOTE  Melvin [name] ZOX:[id number] DOB: 08/29/1927 DOA: 07/01/2016 PCP: Colette Ribas, MD   LOS: 3 days   Brief Narrative: 82 year old male with extensive cardiac disease including CAD status post remote CABG last PCI in April 2016, ischemic cardiomyopathy ( last EF of 30-35%), PAD with prior interventions, LBBB, aortic stenosis status post TAVR on 10/24, implantation of CRT-PPM on 11/9 for  tachybradycardia syndrome, insulin-dependent diabetes mellitus, I per lipidemia, hypertension who was discharged to pain Center for rehabilitation after recent pacemaker placement. Patient was tolerating rehabilitation well and was seen on 12/5 with acute urinary symptoms with fevers and chills. Started on Bactrim DS for UTI. Culture grew Klebsiella ornitholytica. He was discharged home the next day. However after getting home he was feeling increasingly weak and had a fever of 103F at home. Also had chills. In the ED he was septic with fever of 104.17F, hypotensive with blood pressure 97/54 mmHg. blood work showed WBC of 9, hemoglobin 9.3, platelets of 102, sodium 132, chloride 100, worsened renal function with BUN of 46 and creatinine of 2.04, elevated troponin of 0.35 lactic acid of 2.88. procalcitonin was 18.5. LFTs showed mild transaminitis. Chest x-ray showed findings of mild CHF.  Assessment & Plan: Principal Problem:   Sepsis (HCC) Active Problems:   Hypertension   HLD (hyperlipidemia)   Non-insulin dependent type 2 diabetes mellitus (HCC)   Persistent atrial fibrillation (HCC)   Chronic combined systolic and diastolic CHF (congestive heart failure) (HCC)   Hyponatremia   Acute renal failure (HCC)   Elevated troponin   Anemia   Thrombocytopenia (HCC)   Sepsis (HCC) due to UTI vs HCAP - Suspected due to UTI. Cultures obtained on 12/5 growing Klebsiella ornitholytica, resistant to ampicillin but otherwise sensitive - Sepsis physiology has now resolved - Blood cultures with  1 out of 2 bottles with Diphtheroids (Corynebacterium species), likely contaminant - Discontinued vancomycin and narrow Zosyn to Levaquin to have pneumonia coverage as well on 12/9 - Urine culture speciated with coag-negative staph 5^5, likely contaminant, sensitive to fluoroquinolones though  Acute kidney injury - Suspected due to sepsis with UTI. Also being on lisinopril, metformin, torsemide and recently started on Bactrim has caused further worsening. Discontinued all offending agents. - Discontinued IV fluids on 12/9 - Kidney ultrasound with a 2 cm right lower pole renal cyst, but otherwise unremarkable  Acute hypoxic respiratory failure - Supportive treatment, wean off oxygen as tolerated - Patient with more shortness of breath overnight, he complains of a cough, this suggests likely HCAP as etiology for his sepsis, also component of acute on chronic systolic CHF is possible given the fact that he was fluid resuscitated in the setting of sepsis - Provide IV Lasix today, closely monitor respiratory status   Hypotension - Resolved, blood pressure in the 160s systolic this morning - Continue beta blocker. Hold ACE inhibitor and amlodipine.  - Resumed low-dose amlodipine 12/9, IV Lasix today and monitor blood pressure  CAD with history of CABG/ischemic cardiomyopathy with acute on chronic systolic CHF - Monitor strict I/O and daily weight. Continue aspirin and metoprolol. - IV Lasix today 1, monitor I's and O's, monitor weights  Non-insulin dependent type 2 diabetes mellitus (HCC) - Hold metformin. Monitor on sliding scale coverage. A1C of 7.1  Persistent atrial fibrillation (HCC) - Rate controlled. Continue metoprolol, amiodarone and Eliquis  Elevated troponin - Suspected due to demand ischemia with sepsis. Subsequent troponin trending down. No chest pain symptoms or EKG changes. Monitor on telemetry.  Transaminitis -  Suspected due to sepsis +/- hepatic congestion. Hold  statin and niacin. Hold gabapentin.  - Improving, right upper quadrant ultrasound with "nonspecific echogenic lesions in the liver, suspect hepatic granulomata versus partially calcified hemangiomas. These have a benign appearance."  Thrombocytopenia - Suspected due to sepsis. Monitor closely while on anticoagulation.  - It appears stable today, improving  Anemia - drop in hemoglobin from baseline, likely decrease in the setting of sepsis, stable at 9.8  Deconditioning - Patient with repeated hospitalizations in the last several months, he feels extremely weak, and tells me his wife cannot care for him at home he does not want to go back home. I consulted Child psychotherapist for SNF placement. Physical therapy evaluation pending   DVT prophylaxis: Eliquis Code Status: Full Family Communication: no family bedside Disposition Plan: tSNF 2-3 days when ready   Consultants:   None   Procedures:   None   Antimicrobials:  Vancomycin 12/7 >> 12/9   Zosyn 12/7 >> 12/9  Levofloxacin 12/9 >>  Subjective: - Complains of worsening shortness of breath overnight and this morning, he complains of a worsening cough as well.   Objective: Vitals:   06/15/16 0909 06/15/16 1512 06/15/16 2107 06/16/16 0556  BP: (!) 152/60 (!) 145/56 (!) 160/61 (!) 177/63  Pulse: 64 (!) 59 67 66  Resp:  16 18 (!) 21  Temp:  98.9 F (37.2 C) 99 F (37.2 C) 99.8 F (37.7 C)  TempSrc:  Oral Oral Oral  SpO2:  99% 96% 93%  Weight:    78.2 kg (172 lb 6.4 oz)  Height:        Intake/Output Summary (Last 24 hours) at 06/16/16 1053 Last data filed at 06/16/16 1041  Gross per 24 hour  Intake              873 ml  Output             1400 ml  Net             -527 ml   Filed Weights   06/14/16 0500 06/15/16 0400 06/16/16 0556  Weight: 78.2 kg (172 lb 6.4 oz) 78.5 kg (173 lb 1 oz) 78.2 kg (172 lb 6.4 oz)    Examination: Constitutional: NAD Vitals:   06/15/16 0909 06/15/16 1512 06/15/16 2107 06/16/16 0556    BP: (!) 152/60 (!) 145/56 (!) 160/61 (!) 177/63  Pulse: 64 (!) 59 67 66  Resp:  16 18 (!) 21  Temp:  98.9 F (37.2 C) 99 F (37.2 C) 99.8 F (37.7 C)  TempSrc:  Oral Oral Oral  SpO2:  99% 96% 93%  Weight:    78.2 kg (172 lb 6.4 oz)  Height:       Eyes: PERRL, lids and conjunctivae normal Respiratory: clear to auscultation bilaterally, no wheezing, no crackles. Normal respiratory effort.  Cardiovascular: irregular, no murmurs / rubs / gallops. 2+ LE edema. 2+ pedal pulses.  Abdomen: no tenderness. Bowel sounds positive.  Skin: no rashes, lesions, ulcers. No induration Neurologic:  no focal findings  Psychiatric: Normal judgment and insight. Alert and oriented x 3. Normal mood.    Data Reviewed: I have personally reviewed following labs and imaging studies  CBC:  Recent Labs Lab Jul 13, 2016 1940 06/14/16 0235 06/15/16 0444 06/16/16 0620  WBC 10.2 9.0 8.4 8.5  NEUTROABS 8.5*  --   --   --   HGB 10.5* 9.3* 9.3* 9.8*  HCT 31.6* 27.6* 28.4* 30.2*  MCV 94.3 93.9 95.6 96.8  PLT  103* 102* 104* 117*   Basic Metabolic Panel:  Recent Labs Lab 06/12/16 0700 06/19/2016 1940 06/14/16 0235 06/15/16 0444 06/16/16 0620  NA 136 131* 132* 140 141  K 4.2 4.7 4.3 4.7 4.2  CL 96* 95* 100* 107 108  CO2 31 25 23 27 26   GLUCOSE 134* 191* 196* 129* 159*  BUN 43* 48* 46* 33* 17  CREATININE 1.75* 2.25* 2.04* 1.36* 0.97  CALCIUM 9.3 9.0 8.2* 8.6* 8.9   GFR: Estimated Creatinine Clearance: 53.8 mL/min (by C-G formula based on SCr of 0.97 mg/dL). Liver Function Tests:  Recent Labs Lab 06/20/2016 1940 06/14/16 0235 06/15/16 0444 06/16/16 0620  AST 121* 104* 84* 61*  ALT 74* 79* 69* 64*  ALKPHOS 80 70 60 66  BILITOT 0.6 0.5 0.4 0.4  PROT 7.0 6.1* 5.7* 5.8*  ALBUMIN 3.6 3.1* 2.7* 2.7*   No results for input(s): LIPASE, AMYLASE in the last 168 hours. No results for input(s): AMMONIA in the last 168 hours. Coagulation Profile:  Recent Labs Lab 06/19/2016 2022 06/25/2016 2324  INR  1.54 1.54   Cardiac Enzymes:  Recent Labs Lab 07/01/2016 1940 06/17/2016 2336 06/14/16 0510 06/14/16 1117  TROPONINI 0.28* 0.48* 0.35* 0.22*   BNP (last 3 results) No results for input(s): PROBNP in the last 8760 hours. HbA1C: No results for input(s): HGBA1C in the last 72 hours. CBG:  Recent Labs Lab 06/15/16 0827 06/15/16 1201 06/15/16 1701 06/15/16 2103 06/16/16 0735  GLUCAP 128* 210* 227* 123* 150*   Lipid Profile: No results for input(s): CHOL, HDL, LDLCALC, TRIG, CHOLHDL, LDLDIRECT in the last 72 hours. Thyroid Function Tests: No results for input(s): TSH, T4TOTAL, FREET4, T3FREE, THYROIDAB in the last 72 hours. Anemia Panel: No results for input(s): VITAMINB12, FOLATE, FERRITIN, TIBC, IRON, RETICCTPCT in the last 72 hours. Urine analysis:    Component Value Date/Time   COLORURINE YELLOW 06/25/2016 1831   APPEARANCEUR CLEAR 06/16/2016 1831   LABSPEC 1.012 06/20/2016 1831   PHURINE 5.0 06/12/2016 1831   GLUCOSEU NEGATIVE 06/14/2016 1831   HGBUR NEGATIVE 07/03/2016 1831   BILIRUBINUR NEGATIVE 07/06/2016 1831   KETONESUR NEGATIVE 06/17/2016 1831   PROTEINUR 30 (A) 06/23/2016 1831   NITRITE NEGATIVE 06/08/2016 1831   LEUKOCYTESUR TRACE (A) 07/03/2016 1831   Sepsis Labs: Invalid input(s): PROCALCITONIN, LACTICIDVEN  Recent Results (from the past 240 hour(s))  Urine culture     Status: Abnormal   Collection Time: 06/11/16 11:10 AM  Result Value Ref Range Status   Specimen Description URINE, CLEAN CATCH  Final   Special Requests NONE  Final   Culture >=100,000 COLONIES/mL KLEBSIELLA ORNITHINOLYTICA (A)  Final   Report Status 06/14/2016 FINAL  Final   Organism ID, Bacteria KLEBSIELLA ORNITHINOLYTICA (A)  Final      Susceptibility   Klebsiella ornithinolytica - MIC*    AMPICILLIN >=32 RESISTANT Resistant     CEFAZOLIN <=4 SENSITIVE Sensitive     CEFTRIAXONE <=1 SENSITIVE Sensitive     CIPROFLOXACIN <=0.25 SENSITIVE Sensitive     GENTAMICIN <=1 SENSITIVE  Sensitive     IMIPENEM 0.5 SENSITIVE Sensitive     NITROFURANTOIN <=16 SENSITIVE Sensitive     TRIMETH/SULFA <=20 SENSITIVE Sensitive     AMPICILLIN/SULBACTAM 4 SENSITIVE Sensitive     PIP/TAZO <=4 SENSITIVE Sensitive     * >=100,000 COLONIES/mL KLEBSIELLA ORNITHINOLYTICA  Urine culture     Status: Abnormal   Collection Time: 06/12/2016  6:31 PM  Result Value Ref Range Status   Specimen Description URINE, RANDOM  Final  Special Requests NONE  Final   Culture (A)  Final    50,000 COLONIES/mL STAPHYLOCOCCUS SPECIES (COAGULASE NEGATIVE)   Report Status 06/16/2016 FINAL  Final   Organism ID, Bacteria STAPHYLOCOCCUS SPECIES (COAGULASE NEGATIVE) (A)  Final      Susceptibility   Staphylococcus species (coagulase negative) - MIC*    CIPROFLOXACIN 1 SENSITIVE Sensitive     GENTAMICIN 2 SENSITIVE Sensitive     NITROFURANTOIN <=16 SENSITIVE Sensitive     OXACILLIN >=4 RESISTANT Resistant     TETRACYCLINE 2 SENSITIVE Sensitive     VANCOMYCIN 2 SENSITIVE Sensitive     TRIMETH/SULFA 160 RESISTANT Resistant     CLINDAMYCIN <=0.25 SENSITIVE Sensitive     RIFAMPIN <=0.5 SENSITIVE Sensitive     Inducible Clindamycin NEGATIVE Sensitive     * 50,000 COLONIES/mL STAPHYLOCOCCUS SPECIES (COAGULASE NEGATIVE)  Culture, blood (Routine x 2)     Status: Abnormal   Collection Time: 20-Sep-2015  7:40 PM  Result Value Ref Range Status   Specimen Description LEFT ANTECUBITAL  Final   Special Requests BOTTLES DRAWN AEROBIC AND ANAEROBIC 6CC  Final   Culture  Setup Time   Final    GRAM POSITIVE RODS IN BOTH AEROBIC AND ANAEROBIC BOTTLES Organism ID to follow CRITICAL RESULT CALLED TO, READ BACK BY AND VERIFIED WITH: Milus MallickS GAMMONS RN [id number] 06/14/16 A BROWNING    Culture (A)  Final    DIPHTHEROIDS(CORYNEBACTERIUM SPECIES) Standardized susceptibility testing for this organism is not available. Performed [facility]    Report Status 06/15/2016 FINAL  Final  Blood Culture ID Panel (Reflexed)     Status: None    Collection Time: 20-Sep-2015  7:40 PM  Result Value Ref Range Status   Enterococcus species NOT DETECTED NOT DETECTED Final   Listeria monocytogenes NOT DETECTED NOT DETECTED Final   Staphylococcus species NOT DETECTED NOT DETECTED Final   Staphylococcus aureus NOT DETECTED NOT DETECTED Final   Streptococcus species NOT DETECTED NOT DETECTED Final   Streptococcus agalactiae NOT DETECTED NOT DETECTED Final   Streptococcus pneumoniae NOT DETECTED NOT DETECTED Final   Streptococcus pyogenes NOT DETECTED NOT DETECTED Final   Acinetobacter baumannii NOT DETECTED NOT DETECTED Final   Enterobacteriaceae species NOT DETECTED NOT DETECTED Final   Enterobacter cloacae complex NOT DETECTED NOT DETECTED Final   Escherichia coli NOT DETECTED NOT DETECTED Final   Klebsiella oxytoca NOT DETECTED NOT DETECTED Final   Klebsiella pneumoniae NOT DETECTED NOT DETECTED Final   Proteus species NOT DETECTED NOT DETECTED Final   Serratia marcescens NOT DETECTED NOT DETECTED Final   Haemophilus influenzae NOT DETECTED NOT DETECTED Final   Neisseria meningitidis NOT DETECTED NOT DETECTED Final   Pseudomonas aeruginosa NOT DETECTED NOT DETECTED Final   Candida albicans NOT DETECTED NOT DETECTED Final   Candida glabrata NOT DETECTED NOT DETECTED Final   Candida krusei NOT DETECTED NOT DETECTED Final   Candida parapsilosis NOT DETECTED NOT DETECTED Final   Candida tropicalis NOT DETECTED NOT DETECTED Final    Comment: [facility]  Culture, blood (Routine x 2)     Status: None (Preliminary result)   Collection Time: 20-Sep-2015 10:50 PM  Result Value Ref Range Status   Specimen Description BLOOD LEFT FOREARM  Final   Special Requests   Final    BOTTLES DRAWN AEROBIC AND ANAEROBIC AEB 8CC ANA 6CC   Culture NO GROWTH 2 DAYS  Final   Report Status PENDING  Incomplete  MRSA PCR Screening     Status:  None   Collection Time: 06/17/2016 11:36 PM  Result Value Ref Range Status   MRSA by PCR NEGATIVE  NEGATIVE Final    Comment:        The GeneXpert MRSA Assay (FDA approved for NASAL specimens only), is one component of a comprehensive MRSA colonization surveillance program. It is not intended to diagnose MRSA infection nor to guide or monitor treatment for MRSA infections.     Radiology Studies: US Abdomen Complete  Result Date: 06/15/2016 CLINICAL DATA:  Renal failure EXAM: ABDOMEN ULTRASOUND COMPLETE COMPARISON:  04/15/2016 FINDINGS: Gallbladder: Hypoechoic intraluminal sludge layering in the gallbladder. No definite gallstones. Wall thickness measures 2.9 mm. No Murphy's sign elicited or visualized pericholecystic fluid. Common bile duct: Diameter: 4 mm Liver: Background echogenicity is normal. Mild nodularity to the liver surface suggesting a component of cirrhosis. Patent hepatic and portal veins. Scattered echogenic lesions in the liver (2 visualized). Largest echogenic lesion has associated posterior acoustic shadowing compatible with calcification measuring 2.0 x 2.3 x 1.9 cm. This could represent complex calcified cyst, granulomata versus partially calcified hemangioma. IVC: No abnormality visualized. Pancreas: Visualized portion unremarkable. Spleen: Limited visualization because of bowel gas appear Right Kidney: Length: 10.3 cm. Slight increased echogenicity and cortical thinning. No hydronephrosis. Minimally complex hypoechoic cyst in the lower pole measures 2 cm. Left Kidney: Length: 10.0 cm. Normal echogenicity and preserved cortex. No hydronephrosis. Limited visualization because of bowel gas. Abdominal aorta: Obscured by bowel gas. Other findings: No free fluid. IMPRESSION: Gallbladder sludge without cholelithiasis or signs of cholecystitis. No biliary dilatation Nonspecific echogenic lesions in the liver, suspect hepatic granulomata versus partially calcified hemangiomas. These have a benign appearance. 2 cm right lower pole renal cyst Obscured aorta by bowel gas No free fluid or  acute process by ultrasound Electronically Signed   By: Judie Petit.  Shick M.D.   On: 06/15/2016 10:05   Dg Chest Port 1 View  Result Date: 06/15/2016 CLINICAL DATA:  Hemoptysis. EXAM: PORTABLE CHEST 1 VIEW COMPARISON:  06/10/2016 and chest CT 04/15/2016 FINDINGS: Left-sided pacemaker and sternotomy wires unchanged. Patient is rotated to the left. There is moderate stable elevation of the left hemidiaphragm. Mild opacification adjacent the left hemidiaphragm which may be due to atelectasis. The cannot exclude a small amount left pleural fluid. There is hazy patchy opacification of the right mid to lower lung which may be due to asymmetric edema versus infection. Stable cardiomegaly. Remainder of the exam is unchanged. IMPRESSION: Mild opacification adjacent the elevated left hemidiaphragm suggesting atelectasis. Patchy hazy opacification over the right mid to lower lung which may be due to asymmetric edema versus infection. Possible small amount left pleural fluid. Stable cardiomegaly. Electronically Signed   By: Elberta Fortis M.D.   On: 06/15/2016 09:09   Scheduled Meds: . amiodarone  200 mg Oral Daily  . amLODipine  5 mg Oral Daily  . apixaban  5 mg Oral BID  . aspirin  81 mg Oral QPM  . feeding supplement (ENSURE ENLIVE)  237 mL Oral BID BM  . gabapentin  300 mg Oral QHS  . insulin aspart  0-9 Units Subcutaneous TID WC  . levofloxacin (LEVAQUIN) IV  500 mg Intravenous Q24H  . metoprolol  50 mg Oral BID  . sodium chloride flush  3 mL Intravenous Q12H   Continuous Infusions:  Pamella Pert, MD, PhD [facility] Pager [phone]  If 7PM-7AM, please contact night-coverage [email or web address] Password [email or web address] 06/16/2016, 10:53 AM

## 2016-06-17 ENCOUNTER — Inpatient Hospital Stay (HOSPITAL_COMMUNITY): Payer: Medicare Other

## 2016-06-17 DIAGNOSIS — I5043 Acute on chronic combined systolic (congestive) and diastolic (congestive) heart failure: Secondary | ICD-10-CM

## 2016-06-17 DIAGNOSIS — I5023 Acute on chronic systolic (congestive) heart failure: Secondary | ICD-10-CM

## 2016-06-17 DIAGNOSIS — R748 Abnormal levels of other serum enzymes: Secondary | ICD-10-CM

## 2016-06-17 DIAGNOSIS — R0600 Dyspnea, unspecified: Secondary | ICD-10-CM

## 2016-06-17 DIAGNOSIS — A419 Sepsis, unspecified organism: Principal | ICD-10-CM

## 2016-06-17 DIAGNOSIS — J810 Acute pulmonary edema: Secondary | ICD-10-CM

## 2016-06-17 LAB — APTT
APTT: 53 s — AB (ref 24–36)
aPTT: 38 seconds — ABNORMAL HIGH (ref 24–36)

## 2016-06-17 LAB — HEPARIN LEVEL (UNFRACTIONATED)
HEPARIN UNFRACTIONATED: 2.2 [IU]/mL — AB (ref 0.30–0.70)
Heparin Unfractionated: 2.62 IU/mL — ABNORMAL HIGH (ref 0.30–0.70)

## 2016-06-17 LAB — TROPONIN I
TROPONIN I: 0.49 ng/mL — AB (ref <0.03)
TROPONIN I: 0.69 ng/mL — AB (ref <0.03)
Troponin I: 0.56 ng/mL (ref <0.03)

## 2016-06-17 LAB — GLUCOSE, CAPILLARY
GLUCOSE-CAPILLARY: 257 mg/dL — AB (ref 65–99)
GLUCOSE-CAPILLARY: 306 mg/dL — AB (ref 65–99)
GLUCOSE-CAPILLARY: 314 mg/dL — AB (ref 65–99)
GLUCOSE-CAPILLARY: 325 mg/dL — AB (ref 65–99)
Glucose-Capillary: 392 mg/dL — ABNORMAL HIGH (ref 65–99)

## 2016-06-17 LAB — MAGNESIUM: Magnesium: 1.7 mg/dL (ref 1.7–2.4)

## 2016-06-17 LAB — BLOOD GAS, ARTERIAL
ACID-BASE DEFICIT: 1.9 mmol/L (ref 0.0–2.0)
Bicarbonate: 22.4 mmol/L (ref 20.0–28.0)
DRAWN BY: 23534
FIO2: 100
O2 CONTENT: 100 L/min
O2 Saturation: 84.2 %
Pressure support: 40 cmH2O
pCO2 arterial: 44.1 mmHg (ref 32.0–48.0)
pH, Arterial: 7.338 — ABNORMAL LOW (ref 7.350–7.450)
pO2, Arterial: 59.7 mmHg — ABNORMAL LOW (ref 83.0–108.0)

## 2016-06-17 LAB — MRSA PCR SCREENING: MRSA BY PCR: NEGATIVE

## 2016-06-17 LAB — BASIC METABOLIC PANEL
Anion gap: 10 (ref 5–15)
BUN: 12 mg/dL (ref 6–20)
CALCIUM: 8.7 mg/dL — AB (ref 8.9–10.3)
CO2: 26 mmol/L (ref 22–32)
CREATININE: 0.88 mg/dL (ref 0.61–1.24)
Chloride: 100 mmol/L — ABNORMAL LOW (ref 101–111)
GFR calc Af Amer: >60 mL/min (ref >60)
GFR calc non Af Amer: >60 mL/min (ref >60)
GLUCOSE: 219 mg/dL — AB (ref 65–99)
POTASSIUM: 4.6 mmol/L (ref 3.5–5.1)
SODIUM: 136 mmol/L (ref 135–145)

## 2016-06-17 LAB — ECHOCARDIOGRAM COMPLETE
HEIGHTINCHES: 66 in
WEIGHTICAEL: 2696 [oz_av]

## 2016-06-17 MED ORDER — HEPARIN (PORCINE) IN NACL 100-0.45 UNIT/ML-% IJ SOLN
1200.0000 [IU]/h | INTRAMUSCULAR | Status: AC
  Administered 2016-06-17: 1050 [IU]/h via INTRAVENOUS
  Filled 2016-06-17 (×2): qty 250

## 2016-06-17 MED ORDER — MAGNESIUM SULFATE 2 GM/50ML IV SOLN
2.0000 g | Freq: Once | INTRAVENOUS | Status: AC
  Administered 2016-06-17: 2 g via INTRAVENOUS
  Filled 2016-06-17: qty 50

## 2016-06-17 MED ORDER — FUROSEMIDE 10 MG/ML IJ SOLN
60.0000 mg | Freq: Once | INTRAMUSCULAR | Status: AC
  Administered 2016-06-17: 60 mg via INTRAVENOUS
  Filled 2016-06-17: qty 6

## 2016-06-17 MED ORDER — MORPHINE SULFATE (PF) 4 MG/ML IV SOLN
4.0000 mg | Freq: Once | INTRAVENOUS | Status: AC
  Administered 2016-06-17: 4 mg via INTRAVENOUS
  Filled 2016-06-17: qty 1

## 2016-06-17 MED ORDER — FUROSEMIDE 10 MG/ML IJ SOLN
80.0000 mg | Freq: Once | INTRAMUSCULAR | Status: AC
  Administered 2016-06-17: 80 mg via INTRAVENOUS
  Filled 2016-06-17: qty 8

## 2016-06-17 MED ORDER — SODIUM CHLORIDE 0.9% FLUSH
3.0000 mL | Freq: Two times a day (BID) | INTRAVENOUS | Status: DC
  Administered 2016-06-17: 3 mL via INTRAVENOUS

## 2016-06-17 MED ORDER — FUROSEMIDE 10 MG/ML IJ SOLN
40.0000 mg | Freq: Two times a day (BID) | INTRAMUSCULAR | Status: DC
Start: 2016-06-17 — End: 2016-06-18
  Administered 2016-06-17 (×2): 40 mg via INTRAVENOUS
  Filled 2016-06-17 (×2): qty 4

## 2016-06-17 MED ORDER — SODIUM CHLORIDE 0.9 % IV SOLN: INTRAVENOUS | Status: DC

## 2016-06-17 MED ORDER — MORPHINE SULFATE (PF) 4 MG/ML IV SOLN
INTRAVENOUS | Status: AC
  Filled 2016-06-17: qty 1

## 2016-06-17 MED ORDER — MORPHINE SULFATE (PF) 4 MG/ML IV SOLN
4.0000 mg | Freq: Once | INTRAVENOUS | Status: AC
  Administered 2016-06-17: 4 mg via INTRAVENOUS

## 2016-06-17 MED ORDER — SODIUM CHLORIDE 0.9 % IV SOLN
250.0000 mL | INTRAVENOUS | Status: DC | PRN
Start: 2016-06-17 — End: 2016-06-18

## 2016-06-17 MED ORDER — SODIUM CHLORIDE 0.9 % IV SOLN
INTRAVENOUS | Status: DC
  Administered 2016-06-18: 06:00:00 via INTRAVENOUS

## 2016-06-17 MED ORDER — INSULIN ASPART 100 UNIT/ML ~~LOC~~ SOLN
0.0000 [IU] | SUBCUTANEOUS | Status: DC
  Administered 2016-06-17: 7 [IU] via SUBCUTANEOUS
  Administered 2016-06-18: 2 [IU] via SUBCUTANEOUS
  Administered 2016-06-18 (×2): 1 [IU] via SUBCUTANEOUS
  Administered 2016-06-18: 5 [IU] via SUBCUTANEOUS
  Administered 2016-06-18 (×2): 1 [IU] via SUBCUTANEOUS
  Administered 2016-06-19: 2 [IU] via SUBCUTANEOUS
  Administered 2016-06-19: 5 [IU] via SUBCUTANEOUS
  Administered 2016-06-19 (×2): 2 [IU] via SUBCUTANEOUS
  Administered 2016-06-19: 1 [IU] via SUBCUTANEOUS
  Administered 2016-06-19: 2 [IU] via SUBCUTANEOUS
  Administered 2016-06-20: 1 [IU] via SUBCUTANEOUS
  Administered 2016-06-20: 5 [IU] via SUBCUTANEOUS
  Administered 2016-06-20: 1 [IU] via SUBCUTANEOUS

## 2016-06-17 MED ORDER — ASPIRIN 81 MG PO CHEW: 81.0000 mg | CHEWABLE_TABLET | ORAL | Status: DC

## 2016-06-17 MED ORDER — ASPIRIN 81 MG PO CHEW
81.0000 mg | CHEWABLE_TABLET | ORAL | Status: AC
  Administered 2016-06-18: 81 mg via ORAL
  Filled 2016-06-17: qty 1

## 2016-06-17 MED ORDER — METOPROLOL TARTRATE 50 MG PO TABS
75.0000 mg | ORAL_TABLET | Freq: Two times a day (BID) | ORAL | Status: DC
  Administered 2016-06-17 – 2016-06-19 (×3): 75 mg via ORAL
  Filled 2016-06-17 (×3): qty 1

## 2016-06-17 MED ORDER — LISINOPRIL 5 MG PO TABS
5.0000 mg | ORAL_TABLET | Freq: Every day | ORAL | Status: DC
  Administered 2016-06-17: 5 mg via ORAL
  Filled 2016-06-17: qty 1

## 2016-06-17 MED ORDER — SODIUM CHLORIDE 0.9% FLUSH: 3.0000 mL | INTRAVENOUS | Status: DC | PRN

## 2016-06-17 MED ORDER — SODIUM CHLORIDE 0.9% FLUSH
3.0000 mL | INTRAVENOUS | Status: DC | PRN
Start: 2016-06-17 — End: 2016-06-18

## 2016-06-17 MED ORDER — SODIUM CHLORIDE 0.9 % IV SOLN: 250.0000 mL | INTRAVENOUS | Status: DC | PRN

## 2016-06-17 MED ORDER — ZOLPIDEM TARTRATE 5 MG PO TABS
2.5000 mg | ORAL_TABLET | ORAL | Status: DC | PRN
  Administered 2016-06-17: 2.5 mg via ORAL
  Filled 2016-06-17 (×2): qty 1

## 2016-06-17 MED ORDER — SODIUM CHLORIDE 0.9% FLUSH: 3.0000 mL | Freq: Two times a day (BID) | INTRAVENOUS | Status: DC

## 2016-06-17 NOTE — Progress Notes (Signed)
ANTICOAGULATION CONSULT NOTE - Initial Consult  Pharmacy Consult for heparin Indication: atrial fibrillation  Allergies  Allergen Reactions  . Other     Patient is allergic to something but doesn't know the name.  . Pramipexole Swelling    Patient Measurements: Height: 5\' 6"  (167.6 cm) Weight: 168 lb 8 oz (76.4 kg) IBW/kg (Calculated) : 63.8 Heparin Dosing Weight: 76 kg  Vital Signs: Temp: 98.2 F (36.8 C) (12/11 0546) Temp Source: Oral (12/11 0546) BP: 162/82 (12/11 0546) Pulse Rate: 92 (12/11 1018)  Labs:  Recent Labs  06/15/16 0444 06/16/16 0620 06/17/16 0509 06/17/16 0802  HGB 9.3* 9.8*  --   --   HCT 28.4* 30.2*  --   --   PLT 104* 117*  --   --   CREATININE 1.36* 0.97 0.88  --   TROPONINI  --   --   --  0.49*    Estimated Creatinine Clearance: 54.4 mL/min (by C-G formula based on SCr of 0.88 mg/dL).   Medical History: Past Medical History:  Diagnosis Date  . Age-related macular degeneration, dry, both eyes   . Arthritis    "just about all over my body"  . Carotid artery disease (HCC)    a. Carotid US 10/17: R 1-39; L 40-59  . Chronic combined systolic and diastolic CHF (congestive heart failure) (HCC)    a. 11/2015 Echo: EF 30-35%, diff HK, Gr1 DD.  Marland Kitchen. Chronic kidney disease   . Coronary artery disease    a. 1991 PTCA of LAD; b. 1998 CABG x 5 (LIMA->LAD, VG->RPDA->RPLA, VG->OM1, VG->D2; c. 09/2014 MV: lat ischemia, EF 37%; d. 10/2014 PCI: LAD 100, LCX 99ost/100p, RCA 100p, LIMA->LAD ok, VG->D2 ok, VG->OM1 95(3.0x15 Resolute Integrity DES), VG->PDA ok w/ 30% w/in VG->PL. // d. LHC 9/17: grafts patent, med Rx continued  . GERD (gastroesophageal reflux disease)   . High cholesterol   . History of hiatal hernia   . Hypertension   . Ischemic cardiomyopathy    a. 11/2015 Echo: EF 30-35%, diff HK, Gr1 DD.  Marland Kitchen. LBBB (left bundle branch block)   . Osteoporosis   . PAD (peripheral artery disease) (HCC)    a. s/p prior rotational atherectomy of L pop and tib trunk  and PTA of focal popliteal lesions; b. 03/2015 ABI: R - 0.88, L 0.96, LCFA 50-79%, RSFA 50-79% distal stenosis, 3 vessel runoff.  . Palpitations    a. 06/2012 Event monitor: freq ventricular ectopy w/o VT.  Marland Kitchen. Persistent atrial fibrillation (HCC)    a. CHADS2-VASc=6 // b. Eliquis started 11/17 // c. Amiodarone started 11/17  . PVC (premature ventricular contraction) 04/26/1997   on beta blocker/pt had cardio net monitor 06/23/2012  . RLS (restless legs syndrome) 10/01/2012  . Severe aortic stenosis    a. 11/2015 Echo: EF 30-35%, diff HK, Gr1 DD, mod AS, mild AI, mildly dil Ao root and Asc Ao, mild MR, mod dil LA, PASP 46mmHg. // b. Dob Echo 9/17 c/w severe AS // c. s/p TAVR 10/17 // d. Echo 05/01/16: Mod conc LVH, EF 45-50, Gr 1 DD, s/p TAVR with normal function, mean gradient 10 mmHg, mild MR, trivial PI  . Tachy-brady syndrome (HCC)    s/p CRT-P in 11/17  . Type II diabetes mellitus (HCC)   . Unspecified hereditary and idiopathic peripheral neuropathy 10/01/2012    Medications:  Prescriptions Prior to Admission  Medication Sig Dispense Refill Last Dose  . acetaminophen (TYLENOL) 325 MG tablet Take 325-650 mg by mouth every 6 (six)  hours as needed for mild pain or moderate pain.   Taking  . amiodarone (PACERONE) 200 MG tablet Take 1 tablet (200 mg total) by mouth daily. 30 tablet 3 06/28/2016 at Unknown time  . amLODipine (NORVASC) 10 MG tablet Take 1 tablet (10 mg total) by mouth daily. 30 tablet 12 06/20/2016 at Unknown time  . apixaban (ELIQUIS) 5 MG TABS tablet Take 1 tablet (5 mg total) by mouth 2 (two) times daily. 60 tablet 3 06/28/2016 at Unknown time  . aspirin 81 MG tablet Take 81 mg by mouth every evening.    06/20/2016 at Unknown time  . atorvastatin (LIPITOR) 40 MG tablet TAKE 1 TABLET DAILY AT 6 P.M. (KEEP OFFICE VISIT) 90 tablet 2 06/12/2016 at Unknown time  . cetirizine (ZYRTEC) 10 MG tablet Take 10 mg by mouth daily as needed for allergies.    07/06/2016 at Unknown time  .  Dextromethorphan-Guaifenesin 5-100 MG/5ML LIQD Give 15 cc by mouth every 6 hour prn   Taking  . gabapentin (NEURONTIN) 300 MG capsule Take 1 capsule (300 mg total) by mouth at bedtime. 90 capsule 3 06/12/2016 at Unknown time  . hydrALAZINE (APRESOLINE) 10 MG tablet Take 2 tablets (20 mg total) by mouth every 8 (eight) hours. 270 tablet 1 07/01/2016 at Unknown time  . lisinopril (PRINIVIL,ZESTRIL) 10 MG tablet Take 1 tablet (10 mg total) by mouth daily. 30 tablet 3 06/07/2016 at Unknown time  . metformin (FORTAMET) 1000 MG (OSM) 24 hr tablet Take 1,000 mg by mouth 2 (two) times daily with a meal.   06/17/2016 at Unknown time  . metoprolol (LOPRESSOR) 50 MG tablet Take 1 tablet (50 mg total) by mouth 2 (two) times daily. 60 tablet 3 06/26/2016 at 1000  . Multiple Vitamins-Minerals (PRESERVISION AREDS 2 PO) Take 1 capsule by mouth 2 (two) times daily.    06/20/2016 at Unknown time  . multivitamin-iron-minerals-folic acid (CENTRUM) chewable tablet Chew 1 tablet by mouth daily.   06/15/2016 at Unknown time  . niacin (NIASPAN) 750 MG CR tablet Take 2 tablets (1,500 mg total) by mouth at bedtime. 180 tablet 1 06/12/2016 at Unknown time  . nitroGLYCERIN (NITROSTAT) 0.4 MG SL tablet Place 1 tablet (0.4 mg total) under the tongue every 5 (five) minutes as needed for chest pain. 25 tablet 3 06/28/2016 at Unknown time  . oxymetazoline (AFRIN) 0.05 % nasal spray Place 2 sprays into both nostrils 3 (three) times daily as needed for congestion (Allergies).   Taking  . polycarbophil (FIBERCON) 625 MG tablet Take 1,250 mg by mouth daily.    06/15/2016 at Unknown time  . potassium chloride SA (K-DUR,KLOR-CON) 20 MEQ tablet Take 1 tablet (20 mEq total) by mouth daily. 30 tablet 12 06/19/2016 at Unknown time  . torsemide (DEMADEX) 20 MG tablet Take 40 mg by mouth daily.   06/28/2016 at Unknown time    Assessment: 82 yo man who was on eliquis for afib.  He has respiratory distress and will be transferred to ICU.  Eliquis will be  held so heparin therapy will be initiated.  Last dose of eliquis last night at 20:26.  We will be using aPTT to guide therapy until aPTT and heparin levels correlate. Goal of Therapy:  Heparin level 0.3-0.7 units/ml aPTT 66-102 seconds Monitor platelets by anticoagulation protocol: Yes   Plan:  Check baseline aPTT and heparin level Start heparin drip with no bolus at 1050 units/hr Check aPTT and heparin level 6 hours after start Daily aPTT, HL and CBC while  on heparin Monitor for bleeding complications  Alanda Colton Poteet 06/17/2016,12:27 PM

## 2016-06-17 NOTE — Progress Notes (Signed)
PROGRESS NOTE                                                                                                                                                                                                             Patient Demographics:    Melvin [name], is a 82 y.o. male, DOB - 02/21/1928, ZOX:[id number]  Admit date - 06/20/2016   Admitting Physician Jonah Blue, MD  Outpatient Primary MD for the patient is Colette Ribas, MD  LOS - 4  Outpatient Specialists: [facility] [place] cardiology Dr. Ladona Ridgel (EP)  Chief Complaint  Patient presents with  . Urinary Tract Infection  . 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 Patient was tolerating rehabilitation well and was seen on 12/5 with acute urinary symptoms with fevers and chills. Started on Bactrim DS for UTI. Culture grew Klebsiella ornitholytica. He was discharged home the next day. However after getting home he was feeling increasingly weak and had a fever of 103F at home. Also had chills. In the ED he was septic with fever of 104.40F, hypotensive with blood pressure 97/54 mmHg.blood work showed WBC of 9, hemoglobin 9.3, platelets of 102, sodium 132, chloride 100, worsened renal function with BUN of 46 and creatinine of 2.04, elevated troponin of 0.35 lactic acid of 2.88. procalcitonin was 18.5. LFTs showed mild transaminitis. Chest x-ray showed findings of mild CHF.  Hospital course complicated with worsening hypoxic respiratory failure  with acute pulmonary edema. Limited 2-D echo showing worsened EF of 20-25%.   Subjective:   Patient has been increasingly short of breath since yesterday evening. Required NRB this morning. After further discussion he agrees to be on BiPAP and was transferred to ICU after IV Lasix given..   Assessment  & Plan :    Principal Problem:   Sepsis (HCC) Secondary to UTI with possible HCAP.  Cultures obtained on 12/5 growing Klebsiella ornitholytica,sensitive to quinolone and transitioned to it.. - Blood cultures with 1 out of  2 bottles with Diphtheroids (Corynebacterium species), likely contaminant -Sepsis currently resolved.  Active Problems: Acute hypoxic respiratory failure/ischemic cardiomyopathy Secondary to worsened combined systolic and diastolic CHF with acute pulmonary edema. Patient placed on NRB this morning and now transitioned to BiPAP. Started on IV Lasix. Monitor strict I/O. Limited 2-D echo showing worsened EF of 20-25%. Cardiology recommends transferring [facility] stepdown unit with plan on right heart catheterization.  Acute kidney injury Secondary to sepsis with UTI and dehydration on presentation. Patient being on lisinopril, metformin and torsemide and recent Bactrim also contributed to further worsening. Discontinued all these medications and given IV hydration on admission. Now resolved.  Hypotension On presentation. Improve after holding blood pressure medications. Neck line  Non-insulin dependent type 2 diabetes mellitus (HCC) - Hold metformin. Monitor on sliding scale coverage. A1C of 7.1  Persistent atrial fibrillation (HCC) - Rate controlled. Continue metoprolol, amiodarone . Switched Eliquis to IV heparin even plan on cardiac cath.  NSVT Replaced magnesium. Increase beta blocker dose.  Elevated troponin - Suspected due to demand ischemia with sepsis. Subsequent troponin trending down. No chest pain symptoms or EKG changes.   Transaminitis -  Suspected due to sepsis +/- hepaticcongestion. Hold statin and niacin. Hold gabapentin.  - Right upper quadrant ultrasound with nonspecific findings.  Thrombocytopenia - Suspected due to sepsis. Monitor closely while on anticoagulation.    Anemia -drop in hemoglobin from baseline, likely decrease in the setting of sepsis, stable at 9.8  Deconditioning - Patient with repeated hospitalizations in the last several months. PT evaluation once able.   Code Status : Full code (patient wishes to continue aggressive care)  Family Communication  : Discussed with wife and daughter  [facility] Plan  : [facility]  Barriers For Discharge : Active symptoms  Consults  : Cardiology  Procedures  :  2-D echo Abdominal ultrasound  DVT Prophylaxis  :  IV heparin  Lab Results  Component Value Date   PLT 117 (L) 06/16/2016    Antibiotics  :    Anti-infectives    Start     Dose/Rate Route Frequency Ordered Stop   06/15/16 1045  levofloxacin (LEVAQUIN) IVPB 500 mg     500 mg 100 mL/hr over 60 Minutes Intravenous Every 24 hours 06/15/16 1033     06/14/16 2000  vancomycin (VANCOCIN) 500 mg in sodium chloride 0.9 % 100 mL IVPB  Status:  Discontinued     500 mg 100 mL/hr over 60 Minutes Intravenous Every 24 hours Sep 03, 2015 1929 06/15/16 1033   06/14/16 0400  piperacillin-tazobactam (ZOSYN) IVPB 3.375 g  Status:  Discontinued     3.375 g 12.5 mL/hr over 240 Minutes Intravenous Every 8 hours Sep 03, 2015 1929 06/15/16 1033   Sep 03, 2015 1845  piperacillin-tazobactam (ZOSYN) IVPB 3.375 g     3.375 g 100 mL/hr over 30 Minutes Intravenous  Once Sep 03, 2015 1842 Sep 03, 2015 2016   Sep 03, 2015 1845  vancomycin (VANCOCIN) IVPB 1000 mg/200 mL premix     1,000 mg 200 mL/hr over 60 Minutes Intravenous  Once Sep 03, 2015 1842 Sep 03, 2015 2101        Objective:   Vitals:   06/17/16 1018 06/17/16 1338 06/17/16 1400 06/17/16 1444  BP:  134/86 (!) 127/93   Pulse: 92 92 90   Resp: (!) 26   (!) 28  Temp:   97 F (36.1 C)    TempSrc:  Oral    SpO2: 95% 100% 98% 95%  Weight:      Height:  Wt Readings from Last 3 Encounters:  06/17/16 76.4 kg (168 lb 8 oz)  06/12/16 74.4 kg (164 lb)  05/29/16 73 kg (161 lb)     Intake/Output Summary (Last 24 hours) at 06/17/16 1529 Last data filed at 06/17/16 1338  Gross per 24 hour  Intake              243 ml  Output             2750 ml  Net            -2507 ml     Physical Exam  Gen: on NRB, tachypneic HEENT: JVD ++, moist mucosa, supple neck Chest: Coarse crackles bilaterally CVS: S1 and S2 irregular, systolic murmur 3/6 GI: soft, NT, ND, BS+ Musculoskeletal: warm, no edema CNS: Alert and oriented    Data Review:    CBC  Recent Labs Lab 07/06/2016 1940 06/14/16 0235 06/15/16 0444 06/16/16 0620  WBC 10.2 9.0 8.4 8.5  HGB 10.5* 9.3* 9.3* 9.8*  HCT 31.6* 27.6* 28.4* 30.2*  PLT 103* 102* 104* 117*  MCV 94.3 93.9 95.6 96.8  MCH 31.3 31.6 31.3 31.4  MCHC 33.2 33.7 32.7 32.5  RDW 15.0 15.0 15.0 15.0  LYMPHSABS 0.4*  --   --   --   MONOABS 1.2*  --   --   --   EOSABS 0.0  --   --   --   BASOSABS 0.0  --   --   --     Chemistries   Recent Labs Lab 06/14/2016 1940 06/14/16 0235 06/15/16 0444 06/16/16 0620 06/17/16 0509 06/17/16 0802  NA 131* 132* 140 141 136  --   K 4.7 4.3 4.7 4.2 4.6  --   CL 95* 100* 107 108 100*  --   CO2 25 23 27 26 26   --   GLUCOSE 191* 196* 129* 159* 219*  --   BUN 48* 46* 33* 17 12  --   CREATININE 2.25* 2.04* 1.36* 0.97 0.88  --   CALCIUM 9.0 8.2* 8.6* 8.9 8.7*  --   MG  --   --   --   --   --  1.7  AST 121* 104* 84* 61*  --   --   ALT 74* 79* 69* 64*  --   --   ALKPHOS 80 70 60 66  --   --   BILITOT 0.6 0.5 0.4 0.4  --   --    ------------------------------------------------------------------------------------------------------------------ No results for input(s): CHOL, HDL, LDLCALC, TRIG, CHOLHDL, LDLDIRECT in the last 72 hours.  Lab Results  Component Value Date   HGBA1C 7.1  (H) 04/26/2016   ------------------------------------------------------------------------------------------------------------------ No results for input(s): TSH, T4TOTAL, T3FREE, THYROIDAB in the last 72 hours.  Invalid input(s): FREET3 ------------------------------------------------------------------------------------------------------------------ No results for input(s): VITAMINB12, FOLATE, FERRITIN, TIBC, IRON, RETICCTPCT in the last 72 hours.  Coagulation profile  Recent Labs Lab 06/12/2016 2022 06/27/2016 2324  INR 1.54 1.54    No results for input(s): DDIMER in the last 72 hours.  Cardiac Enzymes  Recent Labs Lab 06/14/16 1117 06/17/16 0802 06/17/16 1337  TROPONINI 0.22* 0.49* 0.56*   ------------------------------------------------------------------------------------------------------------------    Component Value Date/Time   BNP 151.5 (H) 05/16/2016 0446   BNP 299.6 (H) 01/30/2016 1518    Inpatient Medications  Scheduled Meds: . amiodarone  200 mg Oral Daily  . amLODipine  5 mg Oral Daily  . aspirin  81 mg Oral QPM  . feeding supplement (ENSURE ENLIVE)  237  mL Oral BID BM  . furosemide  40 mg Intravenous Q12H  . gabapentin  300 mg Oral QHS  . insulin aspart  0-9 Units Subcutaneous TID WC  . levofloxacin (LEVAQUIN) IV  500 mg Intravenous Q24H  . lisinopril  5 mg Oral Daily  . metoprolol  75 mg Oral BID  . morphine      . sodium chloride flush  3 mL Intravenous Q12H   Continuous Infusions: . heparin 1,050 Units/hr (06/17/16 1444)   PRN Meds:.acetaminophen, guaiFENesin-dextromethorphan, ondansetron **OR** ondansetron (ZOFRAN) IV, sodium chloride  Micro Results Recent Results (from the past 240 hour(s))  Urine culture     Status: Abnormal   Collection Time: 06/11/16 11:10 AM  Result Value Ref Range Status   Specimen Description URINE, CLEAN CATCH  Final   Special Requests NONE  Final   Culture >=100,000 COLONIES/mL KLEBSIELLA ORNITHINOLYTICA (A)   Final   Report Status 06/14/2016 FINAL  Final   Organism ID, Bacteria KLEBSIELLA ORNITHINOLYTICA (A)  Final      Susceptibility   Klebsiella ornithinolytica - MIC*    AMPICILLIN >=32 RESISTANT Resistant     CEFAZOLIN <=4 SENSITIVE Sensitive     CEFTRIAXONE <=1 SENSITIVE Sensitive     CIPROFLOXACIN <=0.25 SENSITIVE Sensitive     GENTAMICIN <=1 SENSITIVE Sensitive     IMIPENEM 0.5 SENSITIVE Sensitive     NITROFURANTOIN <=16 SENSITIVE Sensitive     TRIMETH/SULFA <=20 SENSITIVE Sensitive     AMPICILLIN/SULBACTAM 4 SENSITIVE Sensitive     PIP/TAZO <=4 SENSITIVE Sensitive     * >=100,000 COLONIES/mL KLEBSIELLA ORNITHINOLYTICA  Urine culture     Status: Abnormal   Collection Time: 07/05/2016  6:31 PM  Result Value Ref Range Status   Specimen Description URINE, RANDOM  Final   Special Requests NONE  Final   Culture (A)  Final    50,000 COLONIES/mL STAPHYLOCOCCUS SPECIES (COAGULASE NEGATIVE)   Report Status 06/16/2016 FINAL  Final   Organism ID, Bacteria STAPHYLOCOCCUS SPECIES (COAGULASE NEGATIVE) (A)  Final      Susceptibility   Staphylococcus species (coagulase negative) - MIC*    CIPROFLOXACIN 1 SENSITIVE Sensitive     GENTAMICIN 2 SENSITIVE Sensitive     NITROFURANTOIN <=16 SENSITIVE Sensitive     OXACILLIN >=4 RESISTANT Resistant     TETRACYCLINE 2 SENSITIVE Sensitive     VANCOMYCIN 2 SENSITIVE Sensitive     TRIMETH/SULFA 160 RESISTANT Resistant     CLINDAMYCIN <=0.25 SENSITIVE Sensitive     RIFAMPIN <=0.5 SENSITIVE Sensitive     Inducible Clindamycin NEGATIVE Sensitive     * 50,000 COLONIES/mL STAPHYLOCOCCUS SPECIES (COAGULASE NEGATIVE)  Culture, blood (Routine x 2)     Status: Abnormal   Collection Time: 07/07/2016  7:40 PM  Result Value Ref Range Status   Specimen Description LEFT ANTECUBITAL  Final   Special Requests BOTTLES DRAWN AEROBIC AND ANAEROBIC 6CC  Final   Culture  Setup Time   Final    GRAM POSITIVE RODS IN BOTH AEROBIC AND ANAEROBIC BOTTLES Organism ID to  follow CRITICAL RESULT CALLED TO, READ BACK BY AND VERIFIED WITH: Milus Mallick RN 1840 06/14/16 A BROWNING    Culture (A)  Final    DIPHTHEROIDS(CORYNEBACTERIUM SPECIES) Standardized susceptibility testing for this organism is not available. Performed at [facility]    Report Status 06/15/2016 FINAL  Final  Blood Culture ID Panel (Reflexed)     Status: None   Collection Time: 06/19/2016  7:40 PM  Result Value Ref Range Status  Enterococcus species NOT DETECTED NOT DETECTED Final   Listeria monocytogenes NOT DETECTED NOT DETECTED Final   Staphylococcus species NOT DETECTED NOT DETECTED Final   Staphylococcus aureus NOT DETECTED NOT DETECTED Final   Streptococcus species NOT DETECTED NOT DETECTED Final   Streptococcus agalactiae NOT DETECTED NOT DETECTED Final   Streptococcus pneumoniae NOT DETECTED NOT DETECTED Final   Streptococcus pyogenes NOT DETECTED NOT DETECTED Final   Acinetobacter baumannii NOT DETECTED NOT DETECTED Final   Enterobacteriaceae species NOT DETECTED NOT DETECTED Final   Enterobacter cloacae complex NOT DETECTED NOT DETECTED Final   Escherichia coli NOT DETECTED NOT DETECTED Final   Klebsiella oxytoca NOT DETECTED NOT DETECTED Final   Klebsiella pneumoniae NOT DETECTED NOT DETECTED Final   Proteus species NOT DETECTED NOT DETECTED Final   Serratia marcescens NOT DETECTED NOT DETECTED Final   Haemophilus influenzae NOT DETECTED NOT DETECTED Final   Neisseria meningitidis NOT DETECTED NOT DETECTED Final   Pseudomonas aeruginosa NOT DETECTED NOT DETECTED Final   Candida albicans NOT DETECTED NOT DETECTED Final   Candida glabrata NOT DETECTED NOT DETECTED Final   Candida krusei NOT DETECTED NOT DETECTED Final   Candida parapsilosis NOT DETECTED NOT DETECTED Final   Candida tropicalis NOT DETECTED NOT DETECTED Final    Comment: Performed at [facility]  Culture, blood (Routine x 2)     Status: None (Preliminary result)   Collection Time: 06/26/2016 10:50  PM  Result Value Ref Range Status   Specimen Description BLOOD LEFT FOREARM  Final   Special Requests   Final    BOTTLES DRAWN AEROBIC AND ANAEROBIC AEB 8CC ANA 6CC   Culture NO GROWTH 4 DAYS  Final   Report Status PENDING  Incomplete  MRSA PCR Screening     Status: None   Collection Time: 06/29/2016 11:36 PM  Result Value Ref Range Status   MRSA by PCR NEGATIVE NEGATIVE Final    Comment:        The GeneXpert MRSA Assay (FDA approved for NASAL specimens only), is one component of a comprehensive MRSA colonization surveillance program. It is not intended to diagnose MRSA infection nor to guide or monitor treatment for MRSA infections.     Radiology Reports US Abdomen Complete  Result Date: 06/15/2016 CLINICAL DATA:  Renal failure EXAM: ABDOMEN ULTRASOUND COMPLETE COMPARISON:  04/15/2016 FINDINGS: Gallbladder: Hypoechoic intraluminal sludge layering in the gallbladder. No definite gallstones. Wall thickness measures 2.9 mm. No Murphy's sign elicited or visualized pericholecystic fluid. Common bile duct: Diameter: 4 mm Liver: Background echogenicity is normal. Mild nodularity to the liver surface suggesting a component of cirrhosis. Patent hepatic and portal veins. Scattered echogenic lesions in the liver (2 visualized). Largest echogenic lesion has associated posterior acoustic shadowing compatible with calcification measuring 2.0 x 2.3 x 1.9 cm. This could represent complex calcified cyst, granulomata versus partially calcified hemangioma. IVC: No abnormality visualized. Pancreas: Visualized portion unremarkable. Spleen: Limited visualization because of bowel gas appear Right Kidney: Length: 10.3 cm. Slight increased echogenicity and cortical thinning. No hydronephrosis. Minimally complex hypoechoic cyst in the lower pole measures 2 cm. Left Kidney: Length: 10.0 cm. Normal echogenicity and preserved cortex. No hydronephrosis. Limited visualization because of bowel gas. Abdominal aorta:  Obscured by bowel gas. Other findings: No free fluid. IMPRESSION: Gallbladder sludge without cholelithiasis or signs of cholecystitis. No biliary dilatation Nonspecific echogenic lesions in the liver, suspect hepatic granulomata versus partially calcified hemangiomas. These have a benign appearance. 2 cm right lower pole renal cyst Obscured aorta by bowel  gas No free fluid or acute process by ultrasound Electronically Signed   By: Judie PetitM.  Shick M.D.   On: 06/15/2016 10:05   Dg Chest Port 1 View  Result Date: 06/17/2016 CLINICAL DATA:  Shortness of breath. EXAM: PORTABLE CHEST 1 VIEW COMPARISON:  06/15/2016. FINDINGS: Cardiac pacer with lead tips over the right atrium right ventricle. Prior CABG. Cardiomegaly with bilateral pulmonary alveolar infiltrates consistent with congestive heart failure. Bilateral pneumonia cannot be excluded. Findings progressed from prior exam. Small bilateral pleural effusions. No pneumothorax. IMPRESSION: 1. Prior CABG. Cardiac pacer noted with lead tips in right atrium right ventricle. 2. Prominent bilateral pulmonary infiltrates consistent pulmonary edema. Findings progressed from prior exam Small bilateral pleural effusions . Electronically Signed   By: Maisie Fushomas  Register   On: 06/17/2016 07:37   Dg Chest Port 1 View  Result Date: 06/15/2016 CLINICAL DATA:  Hemoptysis. EXAM: PORTABLE CHEST 1 VIEW COMPARISON:  03/29/2016 and chest CT 04/15/2016 FINDINGS: Left-sided pacemaker and sternotomy wires unchanged. Patient is rotated to the left. There is moderate stable elevation of the left hemidiaphragm. Mild opacification adjacent the left hemidiaphragm which may be due to atelectasis. The cannot exclude a small amount left pleural fluid. There is hazy patchy opacification of the right mid to lower lung which may be due to asymmetric edema versus infection. Stable cardiomegaly. Remainder of the exam is unchanged. IMPRESSION: Mild opacification adjacent the elevated left hemidiaphragm  suggesting atelectasis. Patchy hazy opacification over the right mid to lower lung which may be due to asymmetric edema versus infection. Possible small amount left pleural fluid. Stable cardiomegaly. Electronically Signed   By: Elberta Fortisaniel  Boyle M.D.   On: 06/15/2016 09:09   Dg Chest Port 1 View  Result Date: 06/17/2016 CLINICAL DATA:  Fever, UTI EXAM: PORTABLE CHEST 1 VIEW COMPARISON:  05/17/2016 FINDINGS: Small left pleural effusion. Trace right pleural effusion. Bilateral interstitial thickening. No pneumothorax. Stable cardiomegaly. Prior CABG. Prior TAVR. Cardiac pacemaker noted. No acute osseous injury. IMPRESSION: 1. Mild CHF. Electronically Signed   By: Elige KoHetal  Patel   On: 03/29/2016 19:22    Time Spent in minutes  35   Eddie NorthHUNGEL, Shon Mansouri M.D on 06/17/2016 at 3:29 PM  Between 7am to 7pm - Pager - [phone]  After 7pm go to [email or web address] - password [email or web address]  [facility] -  Office  [phone]

## 2016-06-17 NOTE — Progress Notes (Signed)
ANTICOAGULATION CONSULT NOTE - Follow Up Consult  Pharmacy Consult for Heparin (apixaban on hold) Indication: atrial fibrillation  Allergies  Allergen Reactions  . Other     Patient is allergic to something but doesn't know the name.  . Pramipexole Swelling   Patient Measurements: Height: 5\' 6"  (167.6 cm) Weight: 168 lb 8 oz (76.4 kg) IBW/kg (Calculated) : 63.8  Vital Signs: Temp: 98.9 F (37.2 C) (12/11 2137) Temp Source: Oral (12/11 2137) BP: 143/79 (12/11 2137) Pulse Rate: 86 (12/11 2316)  Labs:  Recent Labs  06/15/16 0444 06/16/16 0620 06/17/16 0509 06/17/16 0802 06/17/16 1337 06/17/16 1939 06/17/16 1940  HGB 9.3* 9.8*  --   --   --   --   --   HCT 28.4* 30.2*  --   --   --   --   --   PLT 104* 117*  --   --   --   --   --   APTT  --   --   --   --  38* 53*  --   HEPARINUNFRC  --   --   --   --  2.62*  --  2.20*  CREATININE 1.36* 0.97 0.88  --   --   --   --   TROPONINI  --   --   --  0.49* 0.56* 0.69*  --     Estimated Creatinine Clearance: 54.4 mL/min (by C-G formula based on SCr of 0.88 mg/dL).    Assessment: Tx from [facility] for higher level of care, on heparin drip at 1050 units/hr, aPTT is low at 53, using aPTT for dosing due to Apixaban influence on anti-Xa levels. No issues per RN.   Goal of Therapy:  Heparin level 0.3-0.7 units/ml aPTT 66-102 seconds Monitor platelets by anticoagulation protocol: Yes   Plan:  -Inc heparin to 1200 units/hr -0800 HL/aPTT  Abran DukeLedford, Noeh Sparacino 06/17/2016,11:46 PM

## 2016-06-17 NOTE — Progress Notes (Signed)
Patient placed on non-breather mask to help with oxygen saturation and breathing. Patient oxygen saturation now in mid-90s, although patient is still having increased respirations.  Notified MD.  Received order of morphine, will give to patient to help with work of breathing.  Will continue to monitor patient.

## 2016-06-17 NOTE — Progress Notes (Signed)
Patient had c/o trouble breathing.  Patient coughing and oxygen saturation in the 70s.  Notified md of patient condition, MD came to floor to assess patient.  Oxygen level increased to 6 liters and respiratory called and patient placed on high flow oxygen.  Lasix given per MD order.  Will monitor patient and reassess patient.  Patient alert and oriented, responsive, and agreeable to treatment at this time.

## 2016-06-17 NOTE — Progress Notes (Signed)
Patient had been satting 100% on nonrebreather mask. Respiratory therapy transitioned patient to a partial rebreather and his o2 sat dropped to the high 80s. Patient transitioned to BiPAP and O2 sat at 95%.

## 2016-06-17 NOTE — Progress Notes (Signed)
*  PRELIMINARY RESULTS* Echocardiogram 2D Echocardiogram has been performed.  Melvin [name], [name] 06/17/2016, 2:25 PM

## 2016-06-17 NOTE — Progress Notes (Signed)
PT Cancellation Note  Patient Details Name: Melvin [name] MRN: [id number] DOB: [birth year]   Cancelled Treatment:    Reason Eval/Treat Not Completed: Patient not medically ready;Medical issues which prohibited therapy (Pt is transferring to ICU due to respiratory distress.  Will check back tomorrow.)   Carollee HerterBeth Trevionne Advani, PT, DPT X: [phone]

## 2016-06-17 NOTE — Progress Notes (Signed)
Patient work of breathing and respirations increased.  Patient placed back on non-rebreather.  Md notified of change in patient condition.  Will follow orders.  Will monitor patient.

## 2016-06-17 NOTE — Progress Notes (Signed)
PT TRANSFERRED TO [facility] VIA CARE LINK. HEPARIN DRIP INFUSING AT 1050UNITS PER HOUR. PT ON BIPAP. CONDOM CATH IN PLACE. AV PACER MAKER FUNCTIONING PROPERLY.Marland Kitchen. BLEE . CBG OF 314 COVERED W/ 7UNITS NOVOLOG SS COVERAGE INSULIN. TRANSFER REPORT CALL TO JOHNNA RN ON 3W. PT'S DAUGHTER HAS LEFT TO MEET PT AT [facility].

## 2016-06-17 NOTE — Progress Notes (Signed)
Patient removed from non-rebreather and placed back on high flow oxygen per request.  Oxygen saturation maintaining on high flow oxygen, patients breathing has slowed, and patient states that he feels as if he is doing better now.  Notified MD of patient condition, no new orders received, will continue to monitor patient.

## 2016-06-17 NOTE — Consult Note (Addendum)
CARDIOLOGY CONSULT NOTE   Patient ID: Melvin [name] MRN: [id number] DOB/AGE: 12-10-1927 82 y.o.  Admit Date: 07/03/2016 Referring Physician: TRH-Gherghe MD Primary Physician: Colette Ribas, MD Consulting Cardiologist: Dina Rich MD Primary Cardiologist Marlene Lard Reason for Consultation: CHF management  Clinical Summary Melvin [name] is a 82 y.o.male with known history of valvular heart disease, status post TAVR October 2017, history of coronary artery disease with coronary artery bypass grafting, (LIMA to LAD, SVG to circumflex marginal, SVG to mid PDA, SVG to second diagonal vessel of LAD,) PCI to proximal SVG to circumflex, status post Medtronic Bi-V pacemaker for complete heart block and 05/2016, ischemic cardiomyopathy with EF of 40%, atrial fibrillation with RVR with recent admission for same on ELIQUIS. Multiple medical issues as well. or currently a resident of [facility] for [facility].  Was recently treated for Klebsiella UTI in ER with presentation for fever, hemoptysis, and dyspnea. Chest x-ray revealed CHF.   Lactic acid was elevated at 2.88, sodium 131, potassium 4.7, chloride 95, creatinine 2.25. Glucose 19. He was found to be anemic with a hemoglobin of 10.5 hematocrit of 31.6 (compared to previous blood draw 05/27/2016 of a hemoglobin of 12.4 hematocrit 38.8). There was no leukocytosis. Troponin was elevated at 0.28 (compared to 05/16/2016 significantly lower, 1.36, this a.m. 0.49). Blood cultures pending. He was started on antibiotics with Zosyn and vancomycin, treated for fever with acetaminophen. We are asked for cardiac assistant with multiple cardiovascular issues and CHF. He continues on daily dosing of IV Lasix. He is diuresed 1.977 cc since admission with a total urine output of 2.7 liters.  He is currently on NRB mask and will be moving to step down for BiPAP treatment and closer monitoring. ABG abnormal. Ph 7.33, pCO2 44.1, p O2 50.7.  Bicarb 22.4. Compensated respiratory acidosis.  He is unable to give a good history currently due respiratory distress.   Allergies  Allergen Reactions  . Other     Patient is allergic to something but doesn't know the name.  . Pramipexole Swelling    Medications Scheduled Medications: . amiodarone  200 mg Oral Daily  . amLODipine  5 mg Oral Daily  . apixaban  5 mg Oral BID  . aspirin  81 mg Oral QPM  . feeding supplement (ENSURE ENLIVE)  237 mL Oral BID BM  . gabapentin  300 mg Oral QHS  . insulin aspart  0-9 Units Subcutaneous TID WC  . levofloxacin (LEVAQUIN) IV  500 mg Intravenous Q24H  . metoprolol  50 mg Oral BID  . morphine      . sodium chloride flush  3 mL Intravenous Q12H      PRN Medications:  acetaminophen, guaiFENesin-dextromethorphan, ondansetron **OR** ondansetron (ZOFRAN) IV, sodium chloride   Past Medical History:  Diagnosis Date  . Age-related macular degeneration, dry, both eyes   . Arthritis    "just about all over my body"  . Carotid artery disease (HCC)    a. Carotid US 10/17: R 1-39; L 40-59  . Chronic combined systolic and diastolic CHF (congestive heart failure) (HCC)    a. 11/2015 Echo: EF 30-35%, diff HK, Gr1 DD.  Marland Kitchen Chronic kidney disease   . Coronary artery disease    a. 1991 PTCA of LAD; b. 1998 CABG x 5 (LIMA->LAD, VG->RPDA->RPLA, VG->OM1, VG->D2; c. 09/2014 MV: lat ischemia, EF 37%; d. 10/2014 PCI: LAD 100, LCX 99ost/100p, RCA 100p, LIMA->LAD ok, VG->D2 ok, VG->OM1 95(3.0x15 Resolute Integrity DES), VG->PDA ok w/ 30%  w/in VG->PL. // d. LHC 9/17: grafts patent, med Rx continued  . GERD (gastroesophageal reflux disease)   . High cholesterol   . History of hiatal hernia   . Hypertension   . Ischemic cardiomyopathy    a. 11/2015 Echo: EF 30-35%, diff HK, Gr1 DD.  Marland Kitchen. LBBB (left bundle branch block)   . Osteoporosis   . PAD (peripheral artery disease) (HCC)    a. s/p prior rotational atherectomy of L pop and tib trunk and PTA of focal popliteal  lesions; b. 03/2015 ABI: R - 0.88, L 0.96, LCFA 50-79%, RSFA 50-79% distal stenosis, 3 vessel runoff.  . Palpitations    a. 06/2012 Event monitor: freq ventricular ectopy w/o VT.  Marland Kitchen. Persistent atrial fibrillation (HCC)    a. CHADS2-VASc=6 // b. Eliquis started 11/17 // c. Amiodarone started 11/17  . PVC (premature ventricular contraction) 04/26/1997   on beta blocker/pt had cardio net monitor 06/23/2012  . RLS (restless legs syndrome) 10/01/2012  . Severe aortic stenosis    a. 11/2015 Echo: EF 30-35%, diff HK, Gr1 DD, mod AS, mild AI, mildly dil Ao root and Asc Ao, mild MR, mod dil LA, PASP 46mmHg. // b. Dob Echo 9/17 c/w severe AS // c. s/p TAVR 10/17 // d. Echo 05/01/16: Mod conc LVH, EF 45-50, Gr 1 DD, s/p TAVR with normal function, mean gradient 10 mmHg, mild MR, trivial PI  . Tachy-brady syndrome (HCC)    s/p CRT-P in 11/17  . Type II diabetes mellitus (HCC)   . Unspecified hereditary and idiopathic peripheral neuropathy 10/01/2012    Past Surgical History:  Procedure Laterality Date  . APPENDECTOMY    . CARDIAC CATHETERIZATION  04/26/1997   CAD - 50-60% prox to mid LAD with 70% stenosis in mid-distal, 70-80% 1st diag stenosis, 50-60% ostial stenosis in large 2nd diagonal; diffuse RCA stenosis with 40-50% osital narrowing with 70% prox stenosis (Dr. Bishop Limbo. Kelly) >> CABG  . CARDIAC CATHETERIZATION  11/01/2014  . CARDIAC CATHETERIZATION N/A 04/05/2016   Procedure: Right/Left Heart Cath and Coronary/Graft Angiography;  Surgeon: Lennette Biharihomas A Kelly, MD;  [facility];  Service: Cardiovascular;  Laterality: N/A;  . CARDIAC CATHETERIZATION N/A 05/11/2016   Procedure: Temporary Pacemaker;  Surgeon: Yates DecampJay Ganji, MD;  Location: [facility] INVASIVE CV LAB;  Service: Cardiovascular;  Laterality: N/A;  . CATARACT EXTRACTION W/ INTRAOCULAR LENS  IMPLANT, BILATERAL Bilateral   . CORONARY ARTERY BYPASS GRAFT  04/1997   CABGx5 - LIMA to LAD, SVG to PDA, SVG to PLA, VG to OM1, VG to 2nd diagonal (Dr.  Dorris FetchHendrickson)  . DOPPLER ECHOCARDIOGRAPHY  05/18/2012   EF 45-50%, LV systolic function mildly reduced; borderline LA enlargment; mild mitral annular calcif, mild-mod MR; mild TR with normal RVSP; mild calcification of AV leaflets and mild-mod valvular AS with mild regurg      . DOPPLER ECHOCARDIOGRAPHY  08/19/2011   EF 50-55%, mod conc LVH; mild mitral annular calcif with mild MR; mild-mod TR; AV mildly sclerotic with mild valvular AS and mild regurg, mild aortic root dilatation  . EP IMPLANTABLE DEVICE N/A 05/16/2016   Procedure: BiV Pacemaker Insertion CRT-P;  Surgeon: Marinus MawGregg W Taylor, MD;  [facility];  Service: Cardiovascular;  Laterality: N/A;  . HERNIA REPAIR    . HIATAL HERNIA REPAIR    . INGUINAL HERNIA REPAIR Bilateral   . LEFT AND RIGHT HEART CATHETERIZATION WITH CORONARY ANGIOGRAM N/A 11/01/2014   Procedure: LEFT AND RIGHT HEART CATHETERIZATION WITH CORONARY ANGIOGRAM;  Surgeon: Maisie Fushomas  Alphonsus Sias, MD; EF35-40%, mild AS w/ 2+ AI, LAD, CFX, RCA 100%, LIMA-LAD OK, SVG-D1 OK, SVG-PDA 30%, SVG-OM 95%  . LOWER EXTREMITY ANGIOGRAM  02/05/2011   Diamondback orbital rotational atherectomy, percutaneous transluminal angioplasty of high-grade calcified popliteal & tibioperoneal stenosis (Dr. Erlene Quan)  . LOWER EXTREMITY ARTERIAL DOPPLER  02/2012   RLE - mild to mod arterial insuff; RSFA 50-69% diameter reduction; R pop 50-69% diameter reduction; posterior tibial (R) demonstrates occlusive disease; LSFA/prox pop 0-49% diameter reduction; L distal pop 50-69% diameter reduction; posterior tibial (L) demonstrates occlusive disease  . NM MYOCAR PERF EJECTION FRACTION  09/2011   lexiscan - no reversible ischmai, fixed anteroseptal defect r/t LBBB; EF 51%; septal hypokinesis; low risk but abnormal  . PERCUTANEOUS CORONARY STENT INTERVENTION (PCI-S) N/A 11/02/2014   Procedure: PERCUTANEOUS CORONARY STENT INTERVENTION (PCI-S);  Surgeon: Lennette Bihari, MD; 3.015 mm Resolute integrity DES to the  SVG-OM   . TEE WITHOUT CARDIOVERSION N/A 04/30/2016   Procedure: TRANSESOPHAGEAL ECHOCARDIOGRAM (TEE);  Surgeon: Kathleene Hazel, MD;  Location: [facility] OR;  Service: Open Heart Surgery;  Laterality: N/A;  . TONSILLECTOMY    . TRANSCATHETER AORTIC VALVE REPLACEMENT, TRANSFEMORAL N/A 04/30/2016   Procedure: TRANSCATHETER AORTIC VALVE REPLACEMENT, TRANSFEMORAL;  Surgeon: Kathleene Hazel, MD;  Location: [facility] OR;  Service: Open Heart Surgery;  Laterality: N/A;    Family History  Problem Relation Age of Onset  . CAD Mother   . Heart attack Father    Social History Melvin [name] reports that he has never smoked. He has never used smokeless tobacco. Melvin [name] reports that he does not drink alcohol.  Review of Systems Complete review of systems are found to be negative unless outlined in H&P above.  Physical Examination Blood pressure (!) 162/82, pulse 94, temperature 98.2 F (36.8 C), temperature source Oral, resp. rate (!) 24, height 5\' 6"  (1.676 m), weight 168 lb 8 oz (76.4 kg), SpO2 (!) 87 %.  Intake/Output Summary (Last 24 hours) at 06/17/16 0907 Last data filed at 06/17/16 0549  Gross per 24 hour  Intake              483 ml  Output             2500 ml  Net            -2017 ml    Telemetry: AV pacing.   GEN: Mild respiratory distress wearing NRB now.  HEENT: Conjunctiva and lids normal, oropharynx clear with moist mucosa. Neck: Supple, no elevated JVP or carotid bruits, no thyromegaly. Lungs: Bilateral rhonchi with occasional coughing.  Cardiac: Regular rate and rhythm, distant,  no S3 or significant systolic murmur, no pericardial rub. Abdomen: Soft, nontender, no hepatomegaly, bowel sounds present, no guarding or rebound. Extremities: 2+ edema of the ankles, distal pulses 2+. Skin: Warm and dry. Musculoskeletal: No kyphosis. Neuropsychiatric: Alert and oriented x3, affect grossly appropriate.  Prior Cardiac Testing/Procedures  1. PPM Insertion  05/16/2016 CONCLUSIONS:  1. Successful implantation of a medtronic BiV pacemaker for symptomatic complete as well as left bundle AV block  2. No early apparent complications.   Melvin Bunting, MD   2. Echocardiogram 05/29/2016 Left ventricle: The cavity size was normal. Wall thickness was   increased in a pattern of mild LVH. Systolic function was mildly   reduced. The estimated ejection fraction was in the range of 45%   to 50%. There is hypokinesis of the mid-apical anteroseptal   myocardium, dyssynergy (paced rhyhtm). Doppler parameters are  consistent with abnormal left ventricular relaxation (grade 1   diastolic dysfunction). - Ventricular septum: Septal motion showed abnormal function and   dyssynergy. - Aortic valve: A bioprosthesis was present, TAVR, well seated,   normal transvalvular velocity. Peak velocity (S): 246 cm/s. - Mitral valve: Calcified annulus. Mildly thickened leaflets . - Left atrium: The atrium was mildly dilated. - Right ventricle: The cavity size was mildly dilated. Wall   thickness was normal. Pacer wire or catheter noted in right   ventricle. - Right atrium: The atrium was mildly dilated. - Tricuspid valve: There was trivial regurgitation. - Compared to the prior study, there has been no significant   interval change.  3. Cardiac Cath 04/05/2016 Conclusion     Ost 2nd Diag to 2nd Diag lesion, 90 %stenosed.  Mid LAD to Dist LAD lesion, 100 %stenosed.  Ost Cx lesion, 100 %stenosed.  Ost RCA lesion, 100 %stenosed.  SVG.  SVG.  SVG.  Origin to Prox Graft lesion, 20 %stenosed.  LIMA.  There is moderate (3+) aortic regurgitation.   Severe multivessel native CAD with 90% stenosis in the second diagonal branch of the LAD and total occlusion of the LAD after the proximal septal perforating artery; total occlusion of the left circumflex vessel ostially and total occlusion of the RCA ostium.  Patent LIMA graft which supplies the mid LAD.   There is a smooth 10% ostial narrowing.  The subclavian vessel takes off much more rightward from the flattened aortic arch.  Patent SVG which supplies the circumflex marginal vessel.  A stent is in the proximal portion of the graft which is patent and has smooth inferior intimal hyperplasia of less than 20%.  Patent SVG which supplies the second diagonal vessel of the LAD.  Patent SVG which supplies the mid PDA.  A sequential limb was not visualized, but the distal RCA gave rise to 3 additional branches beyond the PDA takeoff.  Supravalvular aortography mentioning a dilated aortic root with markedly reduced aortic valve excursion, and at least 2+ angiographic aortic insufficiency.  Significant aortic valve stenosis which has been documented on dobutamine echo as low gradient severe  AS.  Mild pulmonary hypertension.  RECOMMENDATION: The present catheterization continues to demonstrate patency to the vein graft conduits as well as the left internal mammary artery with severe native CAD.  The patient has experienced a definite decline in functional capacity and on recent echo Doppler study EF has declined to 35%.  I have recommended that he follow-up with Dr. Sanjuana Kava to proceed with TAVR and additional imaging requirements prior to final recommendations.    04/30/2016 break Procedure:        Transcatheter Aortic Valve Replacement - Transfemoral Approach             Edwards Sapien 3 THV (size 29 mm, model # K966601, serial # [id number])              Co-Surgeons:                        Alleen Borne, MD and Verne Carrow, MD  Anesthesiologist:                  Krista Blue  Echocardiographer:              Eden Emms  Pre-operative Echo Findings: ? Severe aortic stenosis ? Moderate left ventricular systolic dysfunction ? Moderate AI  Post-operative Echo Findings: ? No paravalvular leak ? Moderate left ventricular systolic dysfunction  Lab Results  Basic Metabolic  Panel:  Recent Labs Lab 06/09/2016 1940 06/14/16 0235 06/15/16 0444 06/16/16 0620 06/17/16 0509 06/17/16 0802  NA 131* 132* 140 141 136  --   K 4.7 4.3 4.7 4.2 4.6  --   CL 95* 100* 107 108 100*  --   CO2 25 23 27 26 26   --   GLUCOSE 191* 196* 129* 159* 219*  --   BUN 48* 46* 33* 17 12  --   CREATININE 2.25* 2.04* 1.36* 0.97 0.88  --   CALCIUM 9.0 8.2* 8.6* 8.9 8.7*  --   MG  --   --   --   --   --  1.7    Liver Function Tests:  Recent Labs Lab 06/08/2016 1940 06/14/16 0235 06/15/16 0444 06/16/16 0620  AST 121* 104* 84* 61*  ALT 74* 79* 69* 64*  ALKPHOS 80 70 60 66  BILITOT 0.6 0.5 0.4 0.4  PROT 7.0 6.1* 5.7* 5.8*  ALBUMIN 3.6 3.1* 2.7* 2.7*    CBC:  Recent Labs Lab 07/03/2016 1940 06/14/16 0235 06/15/16 0444 06/16/16 0620  WBC 10.2 9.0 8.4 8.5  NEUTROABS 8.5*  --   --   --   HGB 10.5* 9.3* 9.3* 9.8*  HCT 31.6* 27.6* 28.4* 30.2*  MCV 94.3 93.9 95.6 96.8  PLT 103* 102* 104* 117*    Cardiac Enzymes:  Recent Labs Lab 06/19/2016 1940 06/22/2016 2336 06/14/16 0510 06/14/16 1117 06/17/16 0802  TROPONINI 0.28* 0.48* 0.35* 0.22* 0.49*    Radiology: US Abdomen Complete  Result Date: 06/15/2016 CLINICAL DATA:  Renal failure EXAM: ABDOMEN ULTRASOUND COMPLETE COMPARISON:  04/15/2016 FINDINGS: Gallbladder: Hypoechoic intraluminal sludge layering in the gallbladder. No definite gallstones. Wall thickness measures 2.9 mm. No Murphy's sign elicited or visualized pericholecystic fluid. Common bile duct: Diameter: 4 mm Liver: Background echogenicity is normal. Mild nodularity to the liver surface suggesting a component of cirrhosis. Patent hepatic and portal veins. Scattered echogenic lesions in the liver (2 visualized). Largest echogenic lesion has associated posterior acoustic shadowing compatible with calcification measuring 2.0 x 2.3 x 1.9 cm. This could represent complex calcified cyst, granulomata versus partially calcified hemangioma. IVC: No abnormality visualized.  Pancreas: Visualized portion unremarkable. Spleen: Limited visualization because of bowel gas appear Right Kidney: Length: 10.3 cm. Slight increased echogenicity and cortical thinning. No hydronephrosis. Minimally complex hypoechoic cyst in the lower pole measures 2 cm. Left Kidney: Length: 10.0 cm. Normal echogenicity and preserved cortex. No hydronephrosis. Limited visualization because of bowel gas. Abdominal aorta: Obscured by bowel gas. Other findings: No free fluid. IMPRESSION: Gallbladder sludge without cholelithiasis or signs of cholecystitis. No biliary dilatation Nonspecific echogenic lesions in the liver, suspect hepatic granulomata versus partially calcified hemangiomas. These have a benign appearance. 2 cm right lower pole renal cyst Obscured aorta by bowel gas No free fluid or acute process by ultrasound Electronically Signed   By: Judie Petit.  Shick M.D.   On: 06/15/2016 10:05   Dg Chest Port 1 View  Result Date: 06/17/2016 CLINICAL DATA:  Shortness of breath. EXAM: PORTABLE CHEST 1 VIEW COMPARISON:  06/15/2016. FINDINGS: Cardiac pacer with lead tips over the right atrium right ventricle. Prior CABG. Cardiomegaly with bilateral pulmonary alveolar infiltrates consistent with congestive heart failure. Bilateral pneumonia cannot be excluded. Findings progressed from prior exam. Small bilateral pleural effusions. No pneumothorax. IMPRESSION: 1. Prior CABG. Cardiac pacer noted with lead tips in right atrium right ventricle. 2. Prominent bilateral pulmonary infiltrates consistent pulmonary edema. Findings progressed from prior exam Small  bilateral pleural effusions . Electronically Signed   By: Maisie Fushomas  Register   On: 06/17/2016 07:37     ECG: AV Paced.    Impression and Recommendations  1. CAD: Know history of CABG. Denies symptoms of chest pain. Remains dyspneic. EKG is non-diagnostic due to PPM. Troponin only minimally elevated compared to prior troponin during recent hospitalization. Remains on  metoprolol 50 mg BID, ASA 81. Marland Kitchen. No ACE currently. Was on lisinopril 10 mg daily on hospital discharge 05/16/2016. Will restart due to hypertension.   2. S/P Bi Ventricular PPM: Medtronic. Placed by Dr. Ladona Ridgelaylor 05/16/2016. PPM interrogation completed on 11./22./2017 and functioning appropriately.   3. Acute on Chronic Systolic CHF:  Most recent echocardiogram demonstrated EF of 45%-50% with CXR revealing CHF. Has been given daily one time doses of lasix IV. Will begin BID IV lasix 40 mg. Can titrate based upon kidney function and I/O. Wt on discharge after PPM placement 169 lbs. Wt today essentially the same. 173 lbs on admission. Insert foley due to respiratory distress and aggressive diureses. Consider limited echo for LV function due to acute CHF.   4. TAVR: Placed on 05/31/2016 due to severe aortic valve stenosis: Most recent echo demonstrated well seated valve Peak velocity (S): 246 cm/s, normal velocities.   5. Respiratory Distress: Being sent to Step Down on BiPAP CXR this am bilateral pulmonary infiltrates consistent with pulmonary edema. Adding IV lasix BID as above.       Signed: Bettey MareKathryn M. Lawrence NP [facility]  06/17/2016, 9:07 AM Co-Sign MD  Patient seen and discussed with NP Lyman BishopLawrence, I agree with her documenation above. 82 yo male history of severe AS s/pt TAVR, HTN, HL, DM2, CAD with prior CABG and PCI, ICM, pacemaker followed by Dr Ladona Ridgelaylor, PAF.   Admitted with UTI with sepsis, abx per primary team. Presentation complicated by AKI that has since resolved. Has had some SOB and cough which appears to have some CHF component, he did receive IVFs in setting of his sepsis and hypotension. Hypotension resolved, he is back on his cardiac meds. Currently on lasix 40mg  IV bid for volume overload(diuretics started yesterday), negative 2 liters yesterday and negative 700mL since admission according to charting.   05/2016 echo: LVEF 45-50%, grade I diastolic dysfunction, normal bioprosthetic  AVR,  K 4.6, Cr 0.88, Mg 1.7, Hgb 9.3, Plt 104 CXR pulmonary edema progressed  Acute on chronic combined systolic/diastolic HF, exacerbated in the setting of acute infection as well as required fluid resuscitiation in setteing of UTI sepsis and hypotension. Good diuresis yesterday negative 2 liters, would continue IV lasix today. Elevated bp's, his home regimen has been restarted now that hypotension resolved, follow trend. Runs of NSVT, K 4.6 Mg 1.8. We will increase lopressor to 75mg  bid. Elevated troponin in setting of infection, hypoxia and tachycardia. We will f/u echo results, follow trend, at this time I think demand ischemia is most likely etiology. We will stop his eliquis and start heparin gtt in case invasive testing does become required. He denies chest pain, his SOB somewhat improved with diuresis since last night.     Dominga FerryJ Branch MD   Addendum 250pm Repeat echo shows LVEF 20-25%, diffuse hypokinesis. LVEF by echo last month was 45-50%. His LVEF had been 30-35% in 11/2015, s/p TAVR improved to 45-50%. There is a clear new decline. Cath 03/2016 showed severe multivessel native disease, patient grafts. Troponin has trended up to 0.56, patient with signficant worsening in his breathing overnight with pulmonary edema on CXR.  I suspect this is likely a stress induced CM given his recent severe systemic illness, however I cannot rule out an acute coronary event. EKG is v-paced and cannot eval for ischemia. He has had some runs of NSVT as well. In this setting recommend RHC/LHC to further evaluate cardiac status. I discussed with patient and his wife today, they are in agreement. Discussed with Dr Gonzella Lex who is in agreement and will arrange transfer. Pending respiratory status possible RHC/LHC today, if not potentially tomorrow.   Family is requesting his primary cardiologist Dr Tresa Endo be made aware of admission and potentially do cath if available. I spoke with daughter and explained we will try to  accomdate this if scheduling allows. I have made Dr Tresa Endo aware.    Dominga Ferry MD

## 2016-06-17 NOTE — Progress Notes (Signed)
Dr Wyline MoodBranch notified of elevated troponin of 0.56 from previous 0.49. Dr Wyline MoodBranch came to bedside and reviewed echo results with patient and patient's wife. RN spoke to the patient's daughter on the phone and she stated she would prefer Dr Tresa EndoKelly to be notified of patient's hospitalization. RN provided Dr Wyline MoodBranch with daughter's telephone number and he called and updated her.

## 2016-06-17 NOTE — Clinical Social Work Note (Signed)
CSW received consult for SNF. Pt is transferring to ICU. Will assess when appropriate.  Derenda FennelKara Jaidence Geisler, LCSW [phone]

## 2016-06-18 ENCOUNTER — Inpatient Hospital Stay (HOSPITAL_COMMUNITY): Payer: Medicare Other

## 2016-06-18 ENCOUNTER — Encounter (HOSPITAL_COMMUNITY): Admission: EM | Disposition: E | Payer: Self-pay | Source: Home / Self Care | Attending: Internal Medicine

## 2016-06-18 DIAGNOSIS — J9621 Acute and chronic respiratory failure with hypoxia: Secondary | ICD-10-CM

## 2016-06-18 DIAGNOSIS — Z7189 Other specified counseling: Secondary | ICD-10-CM

## 2016-06-18 DIAGNOSIS — Z515 Encounter for palliative care: Secondary | ICD-10-CM

## 2016-06-18 DIAGNOSIS — I5041 Acute combined systolic (congestive) and diastolic (congestive) heart failure: Secondary | ICD-10-CM

## 2016-06-18 DIAGNOSIS — I5042 Chronic combined systolic (congestive) and diastolic (congestive) heart failure: Secondary | ICD-10-CM

## 2016-06-18 DIAGNOSIS — R509 Fever, unspecified: Secondary | ICD-10-CM

## 2016-06-18 DIAGNOSIS — R0602 Shortness of breath: Secondary | ICD-10-CM

## 2016-06-18 LAB — GLUCOSE, CAPILLARY
GLUCOSE-CAPILLARY: 126 mg/dL — AB (ref 65–99)
GLUCOSE-CAPILLARY: 148 mg/dL — AB (ref 65–99)
GLUCOSE-CAPILLARY: 183 mg/dL — AB (ref 65–99)
GLUCOSE-CAPILLARY: 185 mg/dL — AB (ref 65–99)
Glucose-Capillary: 128 mg/dL — ABNORMAL HIGH (ref 65–99)
Glucose-Capillary: 264 mg/dL — ABNORMAL HIGH (ref 65–99)

## 2016-06-18 LAB — HEPARIN LEVEL (UNFRACTIONATED): HEPARIN UNFRACTIONATED: 1.7 [IU]/mL — AB (ref 0.30–0.70)

## 2016-06-18 LAB — BASIC METABOLIC PANEL
ANION GAP: 8 (ref 5–15)
BUN: 18 mg/dL (ref 6–20)
CO2: 33 mmol/L — AB (ref 22–32)
Calcium: 8.9 mg/dL (ref 8.9–10.3)
Chloride: 102 mmol/L (ref 101–111)
Creatinine, Ser: 1.05 mg/dL (ref 0.61–1.24)
GLUCOSE: 135 mg/dL — AB (ref 65–99)
Potassium: 3.7 mmol/L (ref 3.5–5.1)
Sodium: 143 mmol/L (ref 135–145)

## 2016-06-18 LAB — CBC
HEMATOCRIT: 30.7 % — AB (ref 39.0–52.0)
HEMOGLOBIN: 9.7 g/dL — AB (ref 13.0–17.0)
MCH: 30 pg (ref 26.0–34.0)
MCHC: 31.6 g/dL (ref 30.0–36.0)
MCV: 95 fL (ref 78.0–100.0)
Platelets: 185 10*3/uL (ref 150–400)
RBC: 3.23 MIL/uL — AB (ref 4.22–5.81)
RDW: 14.4 % (ref 11.5–15.5)
WBC: 17.6 10*3/uL — AB (ref 4.0–10.5)

## 2016-06-18 LAB — APTT
APTT: 86 s — AB (ref 24–36)
aPTT: 76 seconds — ABNORMAL HIGH (ref 24–36)

## 2016-06-18 LAB — TROPONIN I: TROPONIN I: 0.64 ng/mL — AB (ref <0.03)

## 2016-06-18 SURGERY — RIGHT/LEFT HEART CATH AND CORONARY ANGIOGRAPHY

## 2016-06-18 MED ORDER — AMIODARONE HCL IN DEXTROSE 360-4.14 MG/200ML-% IV SOLN
INTRAVENOUS | Status: AC
  Administered 2016-06-18: 15:00:00
  Filled 2016-06-18: qty 200

## 2016-06-18 MED ORDER — AMIODARONE HCL IN DEXTROSE 360-4.14 MG/200ML-% IV SOLN
60.0000 mg/h | INTRAVENOUS | Status: AC
  Administered 2016-06-18 (×2): 60 mg/h via INTRAVENOUS
  Filled 2016-06-18: qty 200

## 2016-06-18 MED ORDER — SENNOSIDES-DOCUSATE SODIUM 8.6-50 MG PO TABS
1.0000 | ORAL_TABLET | Freq: Every day | ORAL | Status: DC
  Administered 2016-06-19: 1 via ORAL
  Filled 2016-06-18: qty 1

## 2016-06-18 MED ORDER — MIDAZOLAM HCL 2 MG/2ML IJ SOLN
INTRAMUSCULAR | Status: AC
  Administered 2016-06-18: 13:00:00
  Filled 2016-06-18: qty 4

## 2016-06-18 MED ORDER — SODIUM CHLORIDE 0.9% FLUSH
10.0000 mL | INTRAVENOUS | Status: DC | PRN
Start: 2016-06-18 — End: 2016-06-22

## 2016-06-18 MED ORDER — METOPROLOL TARTRATE 5 MG/5ML IV SOLN
INTRAVENOUS | Status: AC
  Administered 2016-06-18: 13:00:00
  Filled 2016-06-18: qty 5

## 2016-06-18 MED ORDER — DEXTROSE 5 % IV SOLN
2.0000 g | INTRAVENOUS | Status: DC
  Administered 2016-06-18 – 2016-06-20 (×3): 2 g via INTRAVENOUS
  Filled 2016-06-18 (×3): qty 2

## 2016-06-18 MED ORDER — ORAL CARE MOUTH RINSE
15.0000 mL | Freq: Two times a day (BID) | OROMUCOSAL | Status: DC
  Administered 2016-06-18 – 2016-06-20 (×5): 15 mL via OROMUCOSAL

## 2016-06-18 MED ORDER — CHLORHEXIDINE GLUCONATE 0.12 % MT SOLN
15.0000 mL | Freq: Two times a day (BID) | OROMUCOSAL | Status: DC
  Administered 2016-06-18 – 2016-06-20 (×4): 15 mL via OROMUCOSAL
  Filled 2016-06-18 (×2): qty 15

## 2016-06-18 MED ORDER — AMIODARONE HCL IN DEXTROSE 360-4.14 MG/200ML-% IV SOLN
30.0000 mg/h | INTRAVENOUS | Status: DC
  Administered 2016-06-19 – 2016-06-21 (×4): 30 mg/h via INTRAVENOUS
  Filled 2016-06-18 (×5): qty 200

## 2016-06-18 MED ORDER — VANCOMYCIN HCL IN DEXTROSE 750-5 MG/150ML-% IV SOLN
750.0000 mg | Freq: Two times a day (BID) | INTRAVENOUS | Status: DC
  Administered 2016-06-18 – 2016-06-19 (×4): 750 mg via INTRAVENOUS
  Filled 2016-06-18 (×7): qty 150

## 2016-06-18 MED ORDER — AMIODARONE HCL IN DEXTROSE 360-4.14 MG/200ML-% IV SOLN
INTRAVENOUS | Status: AC
  Administered 2016-06-18: 13:00:00
  Filled 2016-06-18: qty 200

## 2016-06-18 MED ORDER — FUROSEMIDE 10 MG/ML IJ SOLN
80.0000 mg | Freq: Two times a day (BID) | INTRAMUSCULAR | Status: DC
  Administered 2016-06-18 (×2): 80 mg via INTRAVENOUS
  Filled 2016-06-18 (×2): qty 8

## 2016-06-18 NOTE — Care Management Important Message (Signed)
Important Message  Patient Details  Name: Melvin [name] MRN: [id number] Date of Birth: 15-Jul-1927   Medicare Important Message Given:  Yes    Kyla BalzarineShealy, Chizara Mena Abena 07/03/2016, 10:31 AM

## 2016-06-18 NOTE — Progress Notes (Signed)
ANTICOAGULATION CONSULT NOTE - Follow Up Consult  Pharmacy Consult for Heparin (Apixaban on hold) Indication: atrial fibrillation and chest pain/ACS  Allergies  Allergen Reactions  . Other     Patient is allergic to something but doesn't know the name.  . Pramipexole Swelling    Patient Measurements: Height: 5\' 6"  (167.6 cm) Weight: 169 lb 14.4 oz (77.1 kg) IBW/kg (Calculated) : 63.8 Heparin Dosing Weight: 77.1 kg  Vital Signs: BP: 126/77 (12/12 0403) Pulse Rate: 66 (12/12 0750)  Labs:  Recent Labs  06/16/16 0620 06/17/16 0509 06/17/16 0802 06/17/16 1337 06/17/16 1939 06/17/16 1940 06/20/2016 0830  HGB 9.8*  --   --   --   --   --  9.7*  HCT 30.2*  --   --   --   --   --  30.7*  PLT 117*  --   --   --   --   --  185  APTT  --   --   --  38* 53*  --  86*  HEPARINUNFRC  --   --   --  2.62*  --  2.20*  --   CREATININE 0.97 0.88  --   --   --   --  1.05  TROPONINI  --   --  0.49* 0.56* 0.69*  --   --     Estimated Creatinine Clearance: 49.4 mL/min (by C-G formula based on SCr of 1.05 mg/dL).   Medications:  Scheduled:  . amiodarone  200 mg Oral Daily  . amLODipine  5 mg Oral Daily  . aspirin  81 mg Oral QPM  . ceFEPime (MAXIPIME) IV  2 g Intravenous Q24H  . chlorhexidine  15 mL Mouth Rinse BID  . feeding supplement (ENSURE ENLIVE)  237 mL Oral BID BM  . furosemide  80 mg Intravenous Q12H  . gabapentin  300 mg Oral QHS  . insulin aspart  0-9 Units Subcutaneous Q4H  . lisinopril  5 mg Oral Daily  . mouth rinse  15 mL Mouth Rinse q12n4p  . metoprolol  75 mg Oral BID  . sodium chloride flush  3 mL Intravenous Q12H  . sodium chloride flush  3 mL Intravenous Q12H  . sodium chloride flush  3 mL Intravenous Q12H  . vancomycin  750 mg Intravenous Q12H   Infusions:  . sodium chloride    . sodium chloride 10 mL/hr at 06/19/2016 0603  . heparin 1,200 Units/hr (07/02/2016 [id number])    Assessment: 82 yo M on Apixaban PTA for afib.  Initial plan was to transition to heparin  for cardiac cath.  Now no plans for cath - med mgmt only.  Anti-Xa level elevated due to recent Apixaban dosing.  Will continue to monitor heparin using aPTT.  Currently aPTT therapeutic on heparin at 1200 units/hr.  Goal of Therapy:  Heparin level 0.3-0.7 units/ml aPTT 66-102 seconds Monitor platelets by anticoagulation protocol: Yes   Plan:  Continue heparin at 1200 units/hr Recheck aPTT at 1400 for confirmation Daily heparin level, aPTT, and CBC  Lavine Hargrove, Pharm.D., BCPS Clinical Pharmacist 06/26/2016 10:25 AM

## 2016-06-18 NOTE — Progress Notes (Signed)
Patient Name: Melvin LappingBenjamin W [name] Date of Encounter: 06/30/2016  Primary Cardiologist: Dr. Tresa EndoKelly, Dr. Clifton JamesMcAlhany did TAVR in Oct. 2017   Hospital Problem List     Principal Problem:   Sepsis [facility](HCC) Active Problems:   Hypertension   HLD (hyperlipidemia)   Non-insulin dependent type 2 diabetes mellitus (HCC)   Persistent atrial fibrillation (HCC)   Chronic combined systolic and diastolic CHF (congestive heart failure) (HCC)   Hyponatremia   Acute renal failure (HCC)   Elevated troponin   Anemia   Thrombocytopenia (HCC)     Subjective   Denies chest pain. Feels SOB, on Bipap.   Inpatient Medications    Scheduled Meds: . amLODipine  5 mg Oral Daily  . aspirin  81 mg Oral QPM  . ceFEPime (MAXIPIME) IV  2 g Intravenous Q24H  . chlorhexidine  15 mL Mouth Rinse BID  . feeding supplement (ENSURE ENLIVE)  237 mL Oral BID BM  . furosemide  80 mg Intravenous Q12H  . gabapentin  300 mg Oral QHS  . insulin aspart  0-9 Units Subcutaneous Q4H  . lisinopril  5 mg Oral Daily  . mouth rinse  15 mL Mouth Rinse q12n4p  . metoprolol  75 mg Oral BID  . sodium chloride flush  3 mL Intravenous Q12H  . vancomycin  750 mg Intravenous Q12H   Continuous Infusions: . amiodarone    . amiodarone    . heparin 1,200 Units/hr (07/06/2016 0603)   PRN Meds: acetaminophen, guaiFENesin-dextromethorphan, ondansetron **OR** ondansetron (ZOFRAN) IV, sodium chloride, zolpidem   Vital Signs    Vitals:   06/28/2016 1415 06/12/2016 1430 06/07/2016 1445 06/24/2016 1500  BP: 97/63 (!) 86/59 93/61 (!) 81/63  Pulse: (!) 107 95 92 (!) 120  Resp: (!) 29 (!) 32 (!) 33 (!) 28  Temp:      TempSrc:      SpO2: 100% 100% 100% 95%  Weight:      Height:        Intake/Output Summary (Last 24 hours) at 06/20/2016 1517 Last data filed at 07/07/2016 1300  Gross per 24 hour  Intake           219.05 ml  Output              450 ml  Net          -230.95 ml   Filed Weights   06/17/16 0546 06/27/2016 0403 06/15/2016 1400    Weight: 168 lb 8 oz (76.4 kg) 169 lb 14.4 oz (77.1 kg) 164 lb 3.9 oz (74.5 kg)    Physical Exam   GEN: Ill appearing male in no acute distress.  HEENT: Grossly normal.  Neck: Supple, JVD 6-7 cm above clavicle at 45 degrees. No carotid bruits, or masses. Cardiac: RRR, no murmurs, rubs, or gallops. No clubbing, cyanosis, +1 pretibial edema.  Radials/DP/PT 2+ and equal bilaterally.  Respiratory:  Respirations regular and labored. Fine crackles in bases.  GI: Soft, nontender, nondistended, BS + x 4. MS: no deformity or atrophy. Skin: warm and dry, no rash. Neuro:  Strength and sensation are intact. Psych: AAOx3.  Normal affect.  Labs    CBC  Recent Labs  06/16/16 0620 06/22/2016 0830  WBC 8.5 17.6*  HGB 9.8* 9.7*  HCT 30.2* 30.7*  MCV 96.8 95.0  PLT 117* 185   Basic Metabolic Panel  Recent Labs  06/17/16 0509 06/17/16 0802 06/12/2016 0830  NA 136  --  143  K 4.6  --  3.7  CL 100*  --  102  CO2 26  --  33*  GLUCOSE 219*  --  135*  BUN 12  --  18  CREATININE 0.88  --  1.05  CALCIUM 8.7*  --  8.9  MG  --  1.7  --    Liver Function Tests  Recent Labs  06/16/16 0620  AST 61*  ALT 64*  ALKPHOS 66  BILITOT 0.4  PROT 5.8*  ALBUMIN 2.7*   Cardiac Enzymes  Recent Labs  06/17/16 0802 06/17/16 1337 06/17/16 1939  TROPONINI 0.49* 0.56* 0.69*     Telemetry    V paced, frequent PVC's  - Personally Reviewed  ECG    V paced - Personally Reviewed  Radiology    Dg Chest Port 1 View  Result Date: 07/07/2016 CLINICAL DATA:  Urinary tract infection.  Sepsis and fever. EXAM: PORTABLE CHEST 1 VIEW COMPARISON:  Single-view of the chest 06/17/2016 and 06/15/2016. FINDINGS: Extensive bilateral airspace disease, worse on the right persists but appears mildly improved. No pneumothorax or pleural effusion. There is cardiomegaly. IMPRESSION: Persistent but improved right worse than left airspace disease most compatible with pneumonia. Electronically Signed   By: Drusilla Kanner M.D.   On: 06/20/2016 09:04   Dg Chest Port 1 View  Result Date: 06/17/2016 CLINICAL DATA:  Shortness of breath. EXAM: PORTABLE CHEST 1 VIEW COMPARISON:  06/15/2016. FINDINGS: Cardiac pacer with lead tips over the right atrium right ventricle. Prior CABG. Cardiomegaly with bilateral pulmonary alveolar infiltrates consistent with congestive heart failure. Bilateral pneumonia cannot be excluded. Findings progressed from prior exam. Small bilateral pleural effusions. No pneumothorax. IMPRESSION: 1. Prior CABG. Cardiac pacer noted with lead tips in right atrium right ventricle. 2. Prominent bilateral pulmonary infiltrates consistent pulmonary edema. Findings progressed from prior exam Small bilateral pleural effusions . Electronically Signed   By: Maisie Fus  Register   On: 06/17/2016 07:37    Cardiac Studies  Transthoracic Echocardiography 06/17/16 Study Conclusions  - Left ventricle: The cavity size was normal. Wall thickness was   increased increased in a pattern of mild to moderate LVH.   Systolic function was severely reduced. The estimated ejection   fraction was in the range of 20% to 25%. Diastolic function is   abnormal, indeterminate grade. There is evidence of elevated LA   pressure. Diffuse hypokinesis. - Aortic valve: Valve area (VTI): 1.81 cm^2. Valve area (Vmax):   1.62 cm^2. Valve area (Vmean): 1.58 cm^2. - Mitral valve: There was mild regurgitation. - Left atrium: The atrium was moderately dilated. - Right ventricle: Systolic function was mildly reduced. - Right atrium: The atrium was mildly dilated. - Atrial septum: No defect or patent foramen ovale was identified. - Technically difficult study.    Patient Profile     Melvin [name] is a 82 year old male with a past medical history of CAD s/p CABG, ischemic cardiomyopathy, DM, and aortic stenosis s/p TAVR in Oct. 2017. Also developed CHB in Nov. 2017 and had [employer] BiV Pacemaker inserted. Admitted on 2016-06-16 from  home (discharged from SNF the day prior), with fever, productive cough and rigors. Fever was 104 rectally on presentation, treated for urosepsis.   Echo done after TAVR shows EF of 45-50%, Echo done yesterday shows EF of 20-25%.   Assessment & Plan    1. Acute on chronic combined systolic and diastolic CHF: Now with reduced EF of 20-25%, has gotten IV diuresis for 2 days, and is negative 1.8L, weight is inaccurate. Discharge weight after TAVR  was 162 pounds in October, weight is 169 today.   Patient had respiratory distress this am, now requiring Bipap. We will place PICC line to assess CVP and Co Ox. This will help guide diuresis and we can differentiate septic vs. Cardiogenic shock   2. Acute hypoxic respiratory failure: Secondary to CHF and HCAP. Primary team is ordering blood cultures again. Will increase IV lasix to 80mg  BID.   3. Persistent atrial fibrillation: rate controlled, on Amio and metoprolol. Continue same.   Signed, Nicki Guadalajarahomas Kelly, MD  06/11/2016, 3:17 PM   Patient seen and examined. Agree with assessment and plan. Pt is well known to me. He is s/p PCI to LAD in 1991; s/p CABG x 5 in 1998, and PCI to SVG to Lcx with DES stent in 10/2014. He had developped progressive AS with ultimate decline in LV fxn and underwent successful TAVR 04/2016. EF post TAVR had improved to 45-50%. He  h/o AF and developed CHB and underwent BiV pacemaker 05/09/2016. He was hospitalized last week with Klebsiella UTI/sepsis and has developed acute on chronic CHF. F/u echo yesterday at [facility] showed EF now 20 - 25% with severe diffuse hypokinesis. AVR mean gradient 5 and peak 11 mm Hg arguing against valve thrombus. He has been on eliquis anticoagulation. He has been receiving Lasix 80 mg iv bid and BiPAP pressure support with diuresis.  Today he developed A Fib with RVR and underwent attempt at cardioversion with transient success but immediately reverted back to AF and had an unsuccessful 2nd attempt. He received  amiodarone 150 mg x2 and is now on heparin and amiodarone drip; off eliquis. Now in CCU, alert on BiPAP; HR 105-110; BP 104/70 to 90/60 range. If BP drops lower consider levophed. Cr today 1.05 but 2.04 on 12/8. He is on cephepine iv antibiotics.   Lennette Biharihomas A. Kelly, MD, [facility]ano[facility]ACC 07/02/2016 3:17 PM

## 2016-06-18 NOTE — Consult Note (Signed)
Consultation Note Date: 06/19/2016   Patient Name: Melvin [name]  DOB: 01-08-1928  MRN: [id number]  Age / Sex: 82 y.o., male  PCP: Melvin Sites, MD Referring Physician: Jonetta Osgood, MD  Reason for Consultation: Disposition, Establishing goals of care and Psychosocial/spiritual support  HPI/Patient Profile: 82 y.o. male  with past medical history of CAD s/p CABG, aortic stenosis s/p TAVR, hypertension, hyperlipidemia, diabetes, persistent atrial fibrillation, and tachy brady syndrome s/p PPM implant 05/2016. He was admitted on 07/02/2016 with fever, hemoptysis, and shortness of breath. Work-up revealed Urosepsis,  Acute on chronic systolic heart failure (EF 20-25%, prior echo in November with EF 45-50%), and Pneumonia with resultant acute respiratory failure requiring BiPAP. He has also developed A. Fib with RVR with unsuccessful cardioversion.   Clinical Assessment and Goals of Care: I met with Melvin [name]'s wife and daughter outside of the patient's room. I introduced myself and the role of Palliative Care on the patient's care team.   Melvin [name], the patient's daughter, relayed that she had good communication with the care team and had been updated by Melvin [name]'s primary cardiologist 15 minutes prior to our meeting. She was able to verbalize a clear understanding of her father's multiple serious issues, including urosepsis, acute respiratory failure, worsened heart failure and now A. Fib with RVR, and his pneumonia. She was also clear on his goals of care: promote quality of life and avoid suffering. She reiterated his desire for no CPR or intubation, however struggled to understand the conflict of preforming defibrillation and utilizing ACLS medications (conflict being that both could cause significant pain/discomfort). I did try to address these things but she became increasingly withdrawn and did not  engage the topic.   In terms of their expectations for the future, she and her mother remain hopeful that he will improve. They are clear that they want to continue to treat what is treatable, continue full scope care, and assess on a day to day basis. I gently brought-up the potential of not improving, and clinically what that could look like: ongoing use of BiPAP with no improvement, uncontrollable symptomatic A. Fib, uncontrollable heart rate, etc. In these cases I asked them to consider the benefit of ongoing interventions if the goal is to promote quality of life.   Finally, I met with Melvin [name] at his bedside. He had the BiPAP in place, but was able to verbalize words in the mask and I had a writing board present if he wished to write his response. I reviewed the discussion I had with his family, and confirmed that he too wanted to continue treating what was treatable, pursue full scope of care, and reassess daily. He expressed that he had no significant symptoms at present, and felt the interventions we were doing were still beneficial and aligned with his overall health goals.I did not get a chance to discuss code status with him (to clarify his partial code decision), as he felt very tired and wanted to resume napping. We agreed to talk about things  more tomorrow after he was better rested.  Primary Decision Maker PATIENT   SUMMARY OF RECOMMENDATIONS    Continue to treat what is treatable with full scope care. He remains a partial code, which I will work to discuss with him and his family at our next meeting. He and his family understand how serious his situation is, though I do not see them withdrawing any intervention without significant deterioration, despite their stated goal of focusing on quality of life. I suspect he and his family will need a lot of support in light of his poor overall prognosis.   Code Status/Advance Care Planning:  DNR/DNI, does want defibrillation, ACLS  medications, and BiPAP  Symptom Management:   Pt denies significant symptom burden at present.   Palliative Prophylaxis:   Aspiration, Bowel Regimen, Frequent Pain Assessment and Oral Care  Additional Recommendations (Limitations, Scope, Preferences):  Full Scope Treatment  Psycho-social/Spiritual:   Desire for further Chaplaincy support:no  Additional Recommendations: TBD  Prognosis:   Unable to determine  Discharge Planning: To Be Determined      Primary Diagnoses: Present on Admission: . Sepsis ([place]) . Chronic combined systolic and diastolic CHF (congestive heart failure) ([place]) . HLD (hyperlipidemia) . Hypertension . Persistent atrial fibrillation ([place]) . Hyponatremia . Acute renal failure ([place]) . Elevated troponin . Anemia . Thrombocytopenia ([place])   I have reviewed the medical record, interviewed the patient and family, and examined the patient. The following aspects are pertinent.  Past Medical History:  Diagnosis Date  . Age-related macular degeneration, dry, both eyes   . Arthritis    "just about all over my body"  . Carotid artery disease ([place])    a. Carotid US 10/17: R 1-39; L 40-59  . Chronic combined systolic and diastolic CHF (congestive heart failure) ([place])    a. 11/2015 Echo: EF 30-35%, diff HK, Gr1 DD.  Marland Kitchen Chronic kidney disease   . Coronary artery disease    a. 1991 PTCA of LAD; b. 1998 CABG x 5 (LIMA->LAD, VG->RPDA->RPLA, VG->OM1, VG->D2; c. 09/2014 MV: lat ischemia, EF 37%; d. 10/2014 PCI: LAD 100, LCX 99ost/100p, RCA 100p, LIMA->LAD ok, VG->D2 ok, VG->OM1 95(3.0x15 Resolute Integrity DES), VG->PDA ok w/ 30% w/in VG->PL. // d. LHC 9/17: grafts patent, med Rx continued  . GERD (gastroesophageal reflux disease)   . High cholesterol   . History of hiatal hernia   . Hypertension   . Ischemic cardiomyopathy    a. 11/2015 Echo: EF 30-35%, diff HK, Gr1 DD.  Marland Kitchen LBBB (left bundle branch block)   . Osteoporosis   . PAD (peripheral artery disease)  ([place])    a. s/p prior rotational atherectomy of L pop and tib trunk and PTA of focal popliteal lesions; b. 03/2015 ABI: R - 0.88, L 0.96, LCFA 50-79%, RSFA 50-79% distal stenosis, 3 vessel runoff.  . Palpitations    a. 06/2012 Event monitor: freq ventricular ectopy w/o VT.  Marland Kitchen Persistent atrial fibrillation ([place])    a. CHADS2-VASc=6 // b. Eliquis started 11/17 // c. Amiodarone started 11/17  . PVC (premature ventricular contraction) 04/26/1997   on beta blocker/pt had cardio net monitor 06/23/2012  . RLS (restless legs syndrome) 10/01/2012  . [name] aortic stenosis    a. 11/2015 Echo: EF 30-35%, diff HK, Gr1 DD, mod AS, mild AI, mildly dil Ao root and Asc Ao, mild MR, mod dil LA, PASP 32mHg. // b. Dob Echo 9/17 c/w [name] AS // c. s/p TAVR 10/17 // d. Echo 05/01/16: Mod conc LVH, EF  45-50, Gr 1 DD, s/p TAVR with normal function, mean gradient 10 mmHg, mild MR, trivial PI  . Tachy-brady syndrome ([place])    s/p CRT-P in 11/17  . Type II diabetes mellitus ([place])   . Unspecified hereditary and idiopathic peripheral neuropathy 10/01/2012   Social History   Social History  . Marital status: Married    Spouse name: Judson Roch  . Number of children: 1  . Years of education: College   Occupational History  .  Retired   Social History Main Topics  . Smoking status: Never Smoker  . Smokeless tobacco: Never Used  . Alcohol use No  . Drug use: No  . Sexual activity: No   Other Topics Concern  . None   Social History Narrative   Pt lives at home with his spouse.   Has been married 54yr.   Caffeine UWIO:MBTDHR is right handed   Family History  Problem Relation Age of Onset  . CAD Mother   . Heart attack Father    Scheduled Meds: . amLODipine  5 mg Oral Daily  . aspirin  81 mg Oral QPM  . ceFEPime (MAXIPIME) IV  2 g Intravenous Q24H  . chlorhexidine  15 mL Mouth Rinse BID  . feeding supplement (ENSURE ENLIVE)  237 mL Oral BID BM  . furosemide  80 mg Intravenous Q12H  . gabapentin  300 mg  Oral QHS  . insulin aspart  0-9 Units Subcutaneous Q4H  . lisinopril  5 mg Oral Daily  . mouth rinse  15 mL Mouth Rinse q12n4p  . metoprolol  75 mg Oral BID  . sodium chloride flush  3 mL Intravenous Q12H  . vancomycin  750 mg Intravenous Q12H   Continuous Infusions: . amiodarone 60 mg/hr (06/24/2016 1600)  . amiodarone    . heparin 1,200 Units/hr (06/19/2016 0603)   PRN Meds:.acetaminophen, guaiFENesin-dextromethorphan, ondansetron **OR** ondansetron (ZOFRAN) IV, sodium chloride, zolpidem Allergies  Allergen Reactions  . Other     Patient is allergic to something but doesn't know the name.  . Pramipexole Swelling   Review of Systems  Constitutional: Positive for activity change, appetite change and fatigue.  HENT: Positive for hearing loss. Negative for congestion, sore throat and trouble swallowing.   Respiratory: Positive for shortness of breath. Negative for chest tightness and wheezing.   Cardiovascular: Negative for chest pain.  Gastrointestinal: Negative for abdominal pain, constipation, diarrhea and nausea.  Musculoskeletal: Negative for back pain.  Neurological: Negative for dizziness, speech difficulty and light-headedness.  Psychiatric/Behavioral: Negative for confusion and sleep disturbance. The patient is nervous/anxious.     Physical Exam  Constitutional: He is oriented to person, place, and time.  Frail elderly man in bed with BiPAP  HENT:  Head: Normocephalic and atraumatic.  Eyes: EOM are normal.  Neck: Normal range of motion.  Cardiovascular:  A. Fib with RVR  Pulmonary/Chest:  BiPAP, pt appears comfortable  Abdominal: Soft. Normal appearance.  Musculoskeletal: Normal range of motion.  Neurological: He is alert and oriented to person, place, and time.  Skin: Skin is warm and dry. There is pallor.  Psychiatric: He has a normal mood and affect. Judgment and thought content normal. He is slowed. Cognition and memory are normal.    Vital Signs: BP (!) 81/63    Pulse (!) 120   Temp 98.9 F (37.2 C) (Oral)   Resp (!) 28   Ht _0  (1.676 m)   Wt 74.5 kg (164 lb 3.9 oz)   SpO2 95%  BMI 26.51 kg/m  Pain Assessment: No/denies pain POSS *See Group Information*: 1-Acceptable,Awake and alert Pain Score: 0-No pain   SpO2: SpO2: 95 % O2 Device:SpO2: 95 % O2 Flow Rate: .O2 Flow Rate (L/min): 40 L/min (Plus 10)  IO: Intake/output summary:  Intake/Output Summary (Last 24 hours) at 06/19/2016 1542 Last data filed at 06/26/2016 1300  Gross per 24 hour  Intake           219.05 ml  Output              450 ml  Net          -230.95 ml    LBM: Last BM Date: 06/16/16 Baseline Weight: Weight: 74.4 kg (164 lb) Most recent weight: Weight: 74.5 kg (164 lb 3.9 oz)     Palliative Assessment/Data:  PPS 30% at present due to respiratory/cardiac status.   Flowsheet Rows   Flowsheet Row Most Recent Value  Intake Tab  Referral Department  Hospitalist  Unit at Time of Referral  Cardiac/Telemetry Unit  Palliative Care Primary Diagnosis  Cardiac  Date Notified  06/07/2016  Palliative Care Type  New Palliative care  Reason for referral  Clarify Goals of Care  Date of Admission  06/12/2016  # of days IP prior to Palliative referral  5  Clinical Assessment  Psychosocial & Spiritual Assessment  Palliative Care Outcomes      Time In: 9643 Time Out: 1620 Time Total: 50 minutes Greater than 50%  of this time was spent counseling and coordinating care related to the above assessment and plan.  Signed by: Charlynn Court, NP Palliative Medicine Team Team Phone # [phone]

## 2016-06-18 NOTE — Progress Notes (Signed)
Pharmacy Antibiotic Note  Melvin [name] is a 82 y.o. male admitted on 06/08/2016 with UTI and fever.  Pt has been on antibiotics since admission.  Now with acute elevation in WBC and worsening clinical progress.  To start Vancomycin and Cefepime for pneumonia.    Plan: Vancomycin 750mg  IV q12h Cefepime 2gm IV q24h Follow-up cx data, renal function, and clinical progress.  Height: 5\' 6"  (167.6 cm) Weight: 169 lb 14.4 oz (77.1 kg) IBW/kg (Calculated) : 63.8  Temp (24hrs), Avg:98 F (36.7 C), Min:97 F (36.1 C), Max:98.9 F (37.2 C)   Recent Labs Lab 06/15/2016 1940 07/03/2016 2021 07/07/2016 2211 06/09/2016 2324 06/14/16 0235 06/15/16 0444 06/16/16 0620 06/17/16 0509 Apr 12, 2016 0830  WBC 10.2  --   --   --  9.0 8.4 8.5  --  17.6*  CREATININE 2.25*  --   --   --  2.04* 1.36* 0.97 0.88 1.05  LATICACIDVEN  --  2.88* 1.22 1.4 1.0  --   --   --   --     Estimated Creatinine Clearance: 49.4 mL/min (by C-G formula based on SCr of 1.05 mg/dL).    Allergies  Allergen Reactions  . Other     Patient is allergic to something but doesn't know the name.  . Pramipexole Swelling    Antimicrobials this admission: Vancomycin 12/7 >> 12/8, restart 12/12 Zosyn 12/7 >> 12/9 LVQ 12/9>> 12/11 Cefepime 12/12 >>  Dose adjustments this admission:   Microbiology results: 12/7 BCx: 1/2 gpr- diptheroids 12/8 UCx: Klebsiella ornithinolytica, Sens to zosyn, ancef, rocephin, cipro 12/7  UCx:  50k CNS 12/7 MRSA negative 12/11 MRSA negative   Thank you for allowing pharmacy to be a part of this patient's care.  Toys 'R' UsKimberly Raquel Racey, Pharm.D., BCPS Clinical Pharmacist 01-07-16 10:13 AM

## 2016-06-18 NOTE — Progress Notes (Signed)
Peripherally Inserted Central Catheter/Midline Placement  The IV Nurse has discussed with the patient and/or persons authorized to consent for the patient, the purpose of this procedure and the potential benefits and risks involved with this procedure.  The benefits include less needle sticks, lab draws from the catheter, and the patient may be discharged home with the catheter. Risks include, but not limited to, infection, bleeding, blood clot (thrombus formation), and puncture of an artery; nerve damage and irregular heartbeat and possibility to perform a PICC exchange if needed/ordered by physician.  Alternatives to this procedure were also discussed.  Bard Power PICC patient education guide, fact sheet on infection prevention and patient information card has been provided to patient /or left at bedside.    PICC/Midline Placement Documentation  PICC Double Lumen 06/27/2016 PICC Right Basilic 41 cm 0 cm (Active)  Indication for Insertion or Continuance of Line Poor Vasculature-patient has had multiple peripheral attempts or PIVs lasting less than 24 hours 06/16/2016  6:00 PM  Exposed Catheter (cm) 0 cm 07/07/2016  6:00 PM  Dressing Change Due 06/25/16 06/17/2016  6:00 PM       Stacie GlazeJoyce, Kameren Baade Horton 06/25/2016, 6:53 PM

## 2016-06-18 NOTE — Progress Notes (Signed)
Called by ICU for curbside regarding critical Troponin 0.64.   [name] was seen today by the EPS, please see Consult note for full details.   Primary diagnosis urosepsis, persistently hypotensive, continues to have AF with a HR 90-120. He has been on BIPAP all day today and has been unable to take any medications p.o.   Trop trend 0.56 -> 0.69 -> 0.64. No significant change. Per notes, plan is for medical management. Continue Heparin gtt, when able given ACS medications p.o.   Given persistent hypotension despite IVF, may consider discontinuing Metoprolol as already on Amiodarone gtt.   Melvin FickleAditya Karnisha Lefebre, MD

## 2016-06-18 NOTE — Progress Notes (Signed)
Notified by CCMD of patient in Vtach, at bedside patient in no pain or distress, MD paged, [facility], EP, and cardiology to bedside. Amio gtt started bolus given x2, IV lopressor given, 4 of versed given, DCCV X2. Patient remains in rhythm now identified as Afib RVR. Patient respiration becoming more tachypnic remained on BiPAP entire time. Transfer orders written to 4N.

## 2016-06-18 NOTE — Progress Notes (Addendum)
PROGRESS NOTE        PATIENT DETAILS Name: Melvin [name] Age: 82 y.o. Sex: male Date of Birth: Oct 07, 1927 Admit Date: 06/24/2016 Admitting Physician Jonah Blue, MD [id number], Chancy Hurter, MD  Brief Narrative: Patient is a 82 y.o. male with history of severe as s/p TAVR October 2017, history of CAD s/p CABG in 1998 and PCI in 08/2014,hx of complete heart block requiring permanent pacemaker implantation in 05/2016, history of A. fib on anticoagulation with Eliquis presented to [facility] on 12/7 sepsis due to probable UTI. Patient was admitted to [facility] and started on broad-spectrum antibiotic, unfortunately hospital course was complicated by worsening acute hypoxemic respiratory failure due to bilateral pulmonary infiltrates, necessitating BiPAP support. 2-D echocardiogram showed worsening EF of around 25%. Patient was subsequently transferred to [facility] for further evaluation and treatment.   On 12/12, the patient still with hypoxia requiring BiPAP support, and developed wide complex tachycardia. See below for further details  Subjective: Still on BiPAP support.  Per RN- desaturates rapidly when taken off BiPAP.  Assessment/Plan: Sepsis secondary to UTI and possible HCAP: Sepsis pathophysiology has resolved, however hospital course has been complicated by development of severe hypoxia due to bilateral pulmonary infiltrates-not sure if this is HCAP. Given profound hypoxia-worsening chest x-ray findings since admission-and leukocytosis-will restart broad-spectrum antibiotics with vancomycin and cefepime. Repeat blood cultures today. Continue to monitor and stepdown status.  Acute hypoxemic respiratory failure: Continue BiPAP. Has developed worsening bilateral pulmonary infiltrates in his chest x-ray over the past few days-given worsening EF seen on 2-D echocardiogram-I suspect this is probably cardiogenic pulmonary edema-but  with worsening leukocytosis-not sure if there is a pneumonic component. Plans are to aggressively diurese, restart vancomycin/cefepime, place PICC and check Co-Oximetry and CVP- follow clinical course. After long discussion with patient and daughter at bedside-we have agreed to a DNI order.   Acute on chronic combined systolic and diastolic CHF: See above-echo on 12/11 shows EF around 20-25% which is a marked change from a very recent echocardiogram on 11/22 which showed an EF around 45%. Not sure if this is secondary to ischemia (recent cath on 9/29 showed patent grafts) or stress induced cardiomyopathy from sepsis. No significant aortic regurgitation seen on transthoracic echocardiogram to suggest valve malfunction. Although transferred from [facility] for possible right and left heart catheterization-given severity of hypoxia and inability to lie flat-this has been canceled for now. Continue Lasix and follow clinical course.  Wide complex tachycardia due to aberrant atrial fibrillation with RVR: Occurred on 12/12-initially thought to be ventricular tachycardia-bolused with amiodarone-subsequently EP emergently consulted-and underwent urgent cardioversion with initial restoration of sinus rhythm but recurrence of wide complex tachycardia. Upon further evaluation by EP-now thought to have aberrant atrial fibrillation with RVR. Recommendations from EP are to continue with IV amiodarone and IV heparin.  AKI: Thought to be due to hemodynamic mediated injury in a setting of sepsis and dehydration along with contributions from lisinopril and Bactrim. Thankfully creatinine improved with just supportive care. Follow electrolytes.  Elevated troponins: Probably secondary to demand ischemia-given advanced age/frailty, profound hypoxia and worsening clinical status-not sure if pursuing heart catheterization is going to change outcome. Plans are to continue conservative management and follow clinical  course.  Type 2 diabetes: Continue to hold metformin-CBGs stable with SSI. Follow.  History of severe aortic stenosis:underwent TAVR in  October 2017. Transthoracic echo done this admission did not show any significant aortic regurgitation to suggest of valve malfunction. Given severity of hypoxia, not a candidate to pursue TEE in this setting. Family and patient both do not desire aggressive interventions at this time-if he does not improve in the next few days-family/patient are agreeable to transitioning to hospice care rather than pursuing further investigations including her TEE.  History of complete heart block/tachybradycardia syndrome: He is now s/p implantation of permanent pacemaker on 05/16/16. Paced rhythm on telemetry.  History of CAD s/p CABG in 1998-s/p PCI to saphenous vein graft in 10/2014: Continue aspirin and statin and beta blocker when oral intake possible (currently on BiPAP). Has elevated troponin levels-trend is mostly flat-has also developed worsening EF seen on echo this admission-although plans were for cardiac catheterization-given severe hypoxemia and inability to lie flat-this has been canceled for now. Plans are to continue supportive care with conservative treatment and follow clinical course.  Anemia: Secondary to acute illness-follow. No indication for transfusion at this time.  Thrombocytopenia: Suspect secondary to sepsis, recent Bactrim therapy. Platelet count is now back to normal. Follow periodically.  Palliative care/goals of care: Long discussion with patient and daughter multiple times over the day today. Family is aware that patient is critically ill with potential for further decompensation. Family is very  realistic and is aware that patient is frail and likely not a good candidate for aggressive medical care at this point. After much discussion we agreed to continue with IV antibiotics, IV diuresis, PICC line placement and follow clinical course for the next  day or so. If no improvement, family and patient are agreeable to consider transitioning to hospice care. We initially agreed for a DO NOT RESUSCITATE order, however this was later changed to a limited code (DO NOT INTUBATE) when patient consented to defibrillation when he went into wide complex tachycardia. We have consulted palliative care as well.  DVT Prophylaxis: Full dose anticoagulation with Heparin  Code Status: DNI  Family Communication: Daughter/Spouse at bedside  Disposition Plan: Remain inpatient-SNF or residential hospice on discharge-dependent on clinical course  Antimicrobial agents: See below  Procedures: Echo 12/11>>EF 20-25%  CONSULTS:  cardiology  Time spent: 655 minutes-Greater than 50% of this time was spent in counseling, explanation of diagnosis, planning of further management, and coordination of care.  MEDICATIONS: Anti-infectives    Start     Dose/Rate Route Frequency Ordered Stop   06/15/16 1045  levofloxacin (LEVAQUIN) IVPB 500 mg     500 mg 100 mL/hr over 60 Minutes Intravenous Every 24 hours 06/15/16 1033     06/14/16 2000  vancomycin (VANCOCIN) 500 mg in sodium chloride 0.9 % 100 mL IVPB  Status:  Discontinued     500 mg 100 mL/hr over 60 Minutes Intravenous Every 24 hours 06/11/2016 1929 06/15/16 1033   06/14/16 0400  piperacillin-tazobactam (ZOSYN) IVPB 3.375 g  Status:  Discontinued     3.375 g 12.5 mL/hr over 240 Minutes Intravenous Every 8 hours 06/29/2016 1929 06/15/16 1033   06/22/2016 1845  piperacillin-tazobactam (ZOSYN) IVPB 3.375 g     3.375 g 100 mL/hr over 30 Minutes Intravenous  Once 06/23/2016 1842 06/25/2016 2016   06/30/2016 1845  vancomycin (VANCOCIN) IVPB 1000 mg/200 mL premix     1,000 mg 200 mL/hr over 60 Minutes Intravenous  Once 06/20/2016 1842 06/09/2016 2101      Scheduled Meds: . amiodarone  200 mg Oral Daily  . amLODipine  5 mg Oral Daily  .  aspirin  81 mg Oral QPM  . chlorhexidine  15 mL Mouth Rinse BID  . feeding  supplement (ENSURE ENLIVE)  237 mL Oral BID BM  . furosemide  40 mg Intravenous Q12H  . gabapentin  300 mg Oral QHS  . insulin aspart  0-9 Units Subcutaneous Q4H  . levofloxacin (LEVAQUIN) IV  500 mg Intravenous Q24H  . lisinopril  5 mg Oral Daily  . mouth rinse  15 mL Mouth Rinse q12n4p  . metoprolol  75 mg Oral BID  . sodium chloride flush  3 mL Intravenous Q12H  . sodium chloride flush  3 mL Intravenous Q12H  . sodium chloride flush  3 mL Intravenous Q12H   Continuous Infusions: . sodium chloride    . sodium chloride 10 mL/hr at 07/06/2016 0603  . heparin 1,200 Units/hr (06/11/2016 0603)   PRN Meds:.sodium chloride, sodium chloride, acetaminophen, guaiFENesin-dextromethorphan, ondansetron **OR** ondansetron (ZOFRAN) IV, sodium chloride, sodium chloride flush, sodium chloride flush, zolpidem   PHYSICAL EXAM: Vital signs: Vitals:   07/06/2016 0000 06/17/2016 0324 07/04/2016 0403 06/20/2016 0750  BP:   126/77   Pulse: 65 63 69 66  Resp: (!) 29 (!) 28 (!) 30 (!) 26  Temp:      TempSrc:      SpO2: 97% 97% 95% 96%  Weight:   77.1 kg (169 lb 14.4 oz)   Height: 5\' 6"  (1.676 m)      Filed Weights   06/16/16 0556 06/17/16 0546 06/07/2016 0403  Weight: 78.2 kg (172 lb 6.4 oz) 76.4 kg (168 lb 8 oz) 77.1 kg (169 lb 14.4 oz)   Body mass index is 27.42 kg/m.   General appearance :Awake, alert, On BiPAP-appears comfortable.  Eyes:, pupils equally reactive to light and accomodation,no scleral icterus.Pink conjunctiva HEENT: Atraumatic and Normocephalic Neck: supple, ++JVD. No cervical lymphadenopathy.  Resp: Bibasilar rales extending up to mid lung zones. CVS: S1 S2 regular, + systolic murmur.  GI: Bowel sounds present, Non tender and not distended with no gaurding, rigidity or rebound.No organomegaly Extremities: B/L Lower Ext shows no edema, both legs are warm to touch Neurology:  Moves all 4 tonsils. Psychiatric: Alert and oriented x 3.  Musculoskeletal:No digital cyanosis Skin:No Rash,  warm and dry Wounds:N/A  I have personally reviewed following labs and imaging studies  LABORATORY DATA: CBC:  Recent Labs Lab June 30, 2016 1940 06/14/16 0235 06/15/16 0444 06/16/16 0620  WBC 10.2 9.0 8.4 8.5  NEUTROABS 8.5*  --   --   --   HGB 10.5* 9.3* 9.3* 9.8*  HCT 31.6* 27.6* 28.4* 30.2*  MCV 94.3 93.9 95.6 96.8  PLT 103* 102* 104* 117*    Basic Metabolic Panel:  Recent Labs Lab 2016/06/30 1940 06/14/16 0235 06/15/16 0444 06/16/16 0620 06/17/16 0509 06/17/16 0802  NA 131* 132* 140 141 136  --   K 4.7 4.3 4.7 4.2 4.6  --   CL 95* 100* 107 108 100*  --   CO2 25 23 27 26 26   --   GLUCOSE 191* 196* 129* 159* 219*  --   BUN 48* 46* 33* 17 12  --   CREATININE 2.25* 2.04* 1.36* 0.97 0.88  --   CALCIUM 9.0 8.2* 8.6* 8.9 8.7*  --   MG  --   --   --   --   --  1.7    GFR: Estimated Creatinine Clearance: 58.9 mL/min (by C-G formula based on SCr of 0.88 mg/dL).  Liver Function Tests:  Recent Labs Lab 2016/06/30  1940 06/14/16 0235 06/15/16 0444 06/16/16 0620  AST 121* 104* 84* 61*  ALT 74* 79* 69* 64*  ALKPHOS 80 70 60 66  BILITOT 0.6 0.5 0.4 0.4  PROT 7.0 6.1* 5.7* 5.8*  ALBUMIN 3.6 3.1* 2.7* 2.7*   No results for input(s): LIPASE, AMYLASE in the last 168 hours. No results for input(s): AMMONIA in the last 168 hours.  Coagulation Profile:  Recent Labs Lab Jan 19, 2016 2022 Jan 19, 2016 2324  INR 1.54 1.54    Cardiac Enzymes:  Recent Labs Lab 06/14/16 0510 06/14/16 1117 06/17/16 0802 06/17/16 1337 06/17/16 1939  TROPONINI 0.35* 0.22* 0.49* 0.56* 0.69*    BNP (last 3 results) No results for input(s): PROBNP in the last 8760 hours.  HbA1C: No results for input(s): HGBA1C in the last 72 hours.  CBG:  Recent Labs Lab 06/17/16 2001 06/17/16 2144 06/30/2016 0011 07/01/2016 0402 06/25/2016 0725  GLUCAP 314* 325* 264* 128* 126*    Lipid Profile: No results for input(s): CHOL, HDL, LDLCALC, TRIG, CHOLHDL, LDLDIRECT in the last 72 hours.  Thyroid  Function Tests: No results for input(s): TSH, T4TOTAL, FREET4, T3FREE, THYROIDAB in the last 72 hours.  Anemia Panel: No results for input(s): VITAMINB12, FOLATE, FERRITIN, TIBC, IRON, RETICCTPCT in the last 72 hours.  Urine analysis:    Component Value Date/Time   COLORURINE YELLOW October 22, 2015 1831   APPEARANCEUR CLEAR October 22, 2015 1831   LABSPEC 1.012 October 22, 2015 1831   PHURINE 5.0 October 22, 2015 1831   GLUCOSEU NEGATIVE October 22, 2015 1831   HGBUR NEGATIVE October 22, 2015 1831   BILIRUBINUR NEGATIVE October 22, 2015 1831   KETONESUR NEGATIVE October 22, 2015 1831   PROTEINUR 30 (A) October 22, 2015 1831   NITRITE NEGATIVE October 22, 2015 1831   LEUKOCYTESUR TRACE (A) October 22, 2015 1831    Sepsis Labs: Lactic Acid, Venous    Component Value Date/Time   LATICACIDVEN 1.0 06/14/2016 0235    MICROBIOLOGY: Recent Results (from the past 240 hour(s))  Urine culture     Status: Abnormal   Collection Time: 06/11/16 11:10 AM  Result Value Ref Range Status   Specimen Description URINE, CLEAN CATCH  Final   Special Requests NONE  Final   Culture >=100,000 COLONIES/mL KLEBSIELLA ORNITHINOLYTICA (A)  Final   Report Status 06/14/2016 FINAL  Final   Organism ID, Bacteria KLEBSIELLA ORNITHINOLYTICA (A)  Final      Susceptibility   Klebsiella ornithinolytica - MIC*    AMPICILLIN >=32 RESISTANT Resistant     CEFAZOLIN <=4 SENSITIVE Sensitive     CEFTRIAXONE <=1 SENSITIVE Sensitive     CIPROFLOXACIN <=0.25 SENSITIVE Sensitive     GENTAMICIN <=1 SENSITIVE Sensitive     IMIPENEM 0.5 SENSITIVE Sensitive     NITROFURANTOIN <=16 SENSITIVE Sensitive     TRIMETH/SULFA <=20 SENSITIVE Sensitive     AMPICILLIN/SULBACTAM 4 SENSITIVE Sensitive     PIP/TAZO <=4 SENSITIVE Sensitive     * >=100,000 COLONIES/mL KLEBSIELLA ORNITHINOLYTICA  Urine culture     Status: Abnormal   Collection Time: Jan 19, 2016  6:31 PM  Result Value Ref Range Status   Specimen Description URINE, RANDOM  Final   Special Requests NONE  Final   Culture (A)  Final     50,000 COLONIES/mL STAPHYLOCOCCUS SPECIES (COAGULASE NEGATIVE)   Report Status 06/16/2016 FINAL  Final   Organism ID, Bacteria STAPHYLOCOCCUS SPECIES (COAGULASE NEGATIVE) (A)  Final      Susceptibility   Staphylococcus species (coagulase negative) - MIC*    CIPROFLOXACIN 1 SENSITIVE Sensitive     GENTAMICIN 2 SENSITIVE Sensitive     NITROFURANTOIN <=16 SENSITIVE  Sensitive     OXACILLIN >=4 RESISTANT Resistant     TETRACYCLINE 2 SENSITIVE Sensitive     VANCOMYCIN 2 SENSITIVE Sensitive     TRIMETH/SULFA 160 RESISTANT Resistant     CLINDAMYCIN <=0.25 SENSITIVE Sensitive     RIFAMPIN <=0.5 SENSITIVE Sensitive     Inducible Clindamycin NEGATIVE Sensitive     * 50,000 COLONIES/mL STAPHYLOCOCCUS SPECIES (COAGULASE NEGATIVE)  Culture, blood (Routine x 2)     Status: Abnormal   Collection Time: 06/23/2016  7:40 PM  Result Value Ref Range Status   Specimen Description LEFT ANTECUBITAL  Final   Special Requests BOTTLES DRAWN AEROBIC AND ANAEROBIC 6CC  Final   Culture  Setup Time   Final    GRAM POSITIVE RODS IN BOTH AEROBIC AND ANAEROBIC BOTTLES Organism ID to follow CRITICAL RESULT CALLED TO, READ BACK BY AND VERIFIED WITH: Milus Mallick RN 1840 06/14/16 A BROWNING    Culture (A)  Final    DIPHTHEROIDS(CORYNEBACTERIUM SPECIES) Standardized susceptibility testing for this organism is not available. Performed at [facility]    Report Status 06/15/2016 FINAL  Final  Blood Culture ID Panel (Reflexed)     Status: None   Collection Time: 06/27/2016  7:40 PM  Result Value Ref Range Status   Enterococcus species NOT DETECTED NOT DETECTED Final   Listeria monocytogenes NOT DETECTED NOT DETECTED Final   Staphylococcus species NOT DETECTED NOT DETECTED Final   Staphylococcus aureus NOT DETECTED NOT DETECTED Final   Streptococcus species NOT DETECTED NOT DETECTED Final   Streptococcus agalactiae NOT DETECTED NOT DETECTED Final   Streptococcus pneumoniae NOT DETECTED NOT DETECTED Final    Streptococcus pyogenes NOT DETECTED NOT DETECTED Final   Acinetobacter baumannii NOT DETECTED NOT DETECTED Final   Enterobacteriaceae species NOT DETECTED NOT DETECTED Final   Enterobacter cloacae complex NOT DETECTED NOT DETECTED Final   Escherichia coli NOT DETECTED NOT DETECTED Final   Klebsiella oxytoca NOT DETECTED NOT DETECTED Final   Klebsiella pneumoniae NOT DETECTED NOT DETECTED Final   Proteus species NOT DETECTED NOT DETECTED Final   Serratia marcescens NOT DETECTED NOT DETECTED Final   Haemophilus influenzae NOT DETECTED NOT DETECTED Final   Neisseria meningitidis NOT DETECTED NOT DETECTED Final   Pseudomonas aeruginosa NOT DETECTED NOT DETECTED Final   Candida albicans NOT DETECTED NOT DETECTED Final   Candida glabrata NOT DETECTED NOT DETECTED Final   Candida krusei NOT DETECTED NOT DETECTED Final   Candida parapsilosis NOT DETECTED NOT DETECTED Final   Candida tropicalis NOT DETECTED NOT DETECTED Final    Comment: Performed at [facility]  Culture, blood (Routine x 2)     Status: None (Preliminary result)   Collection Time: 06/25/2016 10:50 PM  Result Value Ref Range Status   Specimen Description BLOOD LEFT FOREARM  Final   Special Requests   Final    BOTTLES DRAWN AEROBIC AND ANAEROBIC AEB 8CC ANA 6CC   Culture NO GROWTH 4 DAYS  Final   Report Status PENDING  Incomplete  MRSA PCR Screening     Status: None   Collection Time: 07/05/2016 11:36 PM  Result Value Ref Range Status   MRSA by PCR NEGATIVE NEGATIVE Final    Comment:        The GeneXpert MRSA Assay (FDA approved for NASAL specimens only), is one component of a comprehensive MRSA colonization surveillance program. It is not intended to diagnose MRSA infection nor to guide or monitor treatment for MRSA infections.   MRSA PCR Screening  Status: None   Collection Time: 06/17/16  1:23 PM  Result Value Ref Range Status   MRSA by PCR NEGATIVE NEGATIVE Final    Comment:        The GeneXpert MRSA  Assay (FDA approved for NASAL specimens only), is one component of a comprehensive MRSA colonization surveillance program. It is not intended to diagnose MRSA infection nor to guide or monitor treatment for MRSA infections.     RADIOLOGY STUDIES/RESULTS: US Abdomen Complete  Result Date: 06/15/2016 CLINICAL DATA:  Renal failure EXAM: ABDOMEN ULTRASOUND COMPLETE COMPARISON:  04/15/2016 FINDINGS: Gallbladder: Hypoechoic intraluminal sludge layering in the gallbladder. No definite gallstones. Wall thickness measures 2.9 mm. No Murphy's sign elicited or visualized pericholecystic fluid. Common bile duct: Diameter: 4 mm Liver: Background echogenicity is normal. Mild nodularity to the liver surface suggesting a component of cirrhosis. Patent hepatic and portal veins. Scattered echogenic lesions in the liver (2 visualized). Largest echogenic lesion has associated posterior acoustic shadowing compatible with calcification measuring 2.0 x 2.3 x 1.9 cm. This could represent complex calcified cyst, granulomata versus partially calcified hemangioma. IVC: No abnormality visualized. Pancreas: Visualized portion unremarkable. Spleen: Limited visualization because of bowel gas appear Right Kidney: Length: 10.3 cm. Slight increased echogenicity and cortical thinning. No hydronephrosis. Minimally complex hypoechoic cyst in the lower pole measures 2 cm. Left Kidney: Length: 10.0 cm. Normal echogenicity and preserved cortex. No hydronephrosis. Limited visualization because of bowel gas. Abdominal aorta: Obscured by bowel gas. Other findings: No free fluid. IMPRESSION: Gallbladder sludge without cholelithiasis or signs of cholecystitis. No biliary dilatation Nonspecific echogenic lesions in the liver, suspect hepatic granulomata versus partially calcified hemangiomas. These have a benign appearance. 2 cm right lower pole renal cyst Obscured aorta by bowel gas No free fluid or acute process by ultrasound Electronically  Signed   By: Judie Petit.  Shick M.D.   On: 06/15/2016 10:05   Dg Chest Port 1 View  Result Date: 06/17/2016 CLINICAL DATA:  Shortness of breath. EXAM: PORTABLE CHEST 1 VIEW COMPARISON:  06/15/2016. FINDINGS: Cardiac pacer with lead tips over the right atrium right ventricle. Prior CABG. Cardiomegaly with bilateral pulmonary alveolar infiltrates consistent with congestive heart failure. Bilateral pneumonia cannot be excluded. Findings progressed from prior exam. Small bilateral pleural effusions. No pneumothorax. IMPRESSION: 1. Prior CABG. Cardiac pacer noted with lead tips in right atrium right ventricle. 2. Prominent bilateral pulmonary infiltrates consistent pulmonary edema. Findings progressed from prior exam Small bilateral pleural effusions . Electronically Signed   By: Maisie Fus  Register   On: 06/17/2016 07:37   Dg Chest Port 1 View  Result Date: 06/15/2016 CLINICAL DATA:  Hemoptysis. EXAM: PORTABLE CHEST 1 VIEW COMPARISON:  06/09/2016 and chest CT 04/15/2016 FINDINGS: Left-sided pacemaker and sternotomy wires unchanged. Patient is rotated to the left. There is moderate stable elevation of the left hemidiaphragm. Mild opacification adjacent the left hemidiaphragm which may be due to atelectasis. The cannot exclude a small amount left pleural fluid. There is hazy patchy opacification of the right mid to lower lung which may be due to asymmetric edema versus infection. Stable cardiomegaly. Remainder of the exam is unchanged. IMPRESSION: Mild opacification adjacent the elevated left hemidiaphragm suggesting atelectasis. Patchy hazy opacification over the right mid to lower lung which may be due to asymmetric edema versus infection. Possible small amount left pleural fluid. Stable cardiomegaly. Electronically Signed   By: Elberta Fortis M.D.   On: 06/15/2016 09:09   Dg Chest Port 1 View  Result Date: 06/20/2016 CLINICAL DATA:  Fever, UTI EXAM:  PORTABLE CHEST 1 VIEW COMPARISON:  05/17/2016 FINDINGS: Small left  pleural effusion. Trace right pleural effusion. Bilateral interstitial thickening. No pneumothorax. Stable cardiomegaly. Prior CABG. Prior TAVR. Cardiac pacemaker noted. No acute osseous injury. IMPRESSION: 1. Mild CHF. Electronically Signed   By: Elige Ko   On: 06/20/2016 19:22     LOS: 5 days   Jeoffrey Massed, MD  [facility] Pager:[phone]  If 7PM-7AM, please contact night-coverage [email or web address] Password [facility] 06/27/2016, 8:41 AM

## 2016-06-18 NOTE — Progress Notes (Signed)
ANTICOAGULATION CONSULT NOTE - Follow Up Consult  Pharmacy Consult for Heparin (Apixaban on hold) Indication: atrial fibrillation and chest pain/ACS  Allergies  Allergen Reactions  . Other     Patient is allergic to something but doesn't know the name.  . Pramipexole Swelling    Patient Measurements: Height: 5\' 6"  (167.6 cm) Weight: 164 lb 3.9 oz (74.5 kg) IBW/kg (Calculated) : 63.8 Heparin Dosing Weight: 77.1 kg  Vital Signs: Temp: 97.8 F (36.6 C) (12/12 1948) Temp Source: Axillary (12/12 1948) BP: 88/63 (12/12 2000) Pulse Rate: 108 (12/12 2000)  Labs:  Recent Labs  06/16/16 0620 06/17/16 0509 06/17/16 0802  06/17/16 1337 06/17/16 1939 06/17/16 1940 06/27/2016 0830 06/27/2016 2000  HGB 9.8*  --   --   --   --   --   --  9.7*  --   HCT 30.2*  --   --   --   --   --   --  30.7*  --   PLT 117*  --   --   --   --   --   --  185  --   APTT  --   --   --   < > 38* 53*  --  86* 76*  HEPARINUNFRC  --   --   --   --  2.62*  --  2.20* 1.70*  --   CREATININE 0.97 0.88  --   --   --   --   --  1.05  --   TROPONINI  --   --  0.49*  --  0.56* 0.69*  --   --   --   < > = values in this interval not displayed.  Estimated Creatinine Clearance: 45.6 mL/min (by C-G formula based on SCr of 1.05 mg/dL).   Medications:  Scheduled:  . amLODipine  5 mg Oral Daily  . aspirin  81 mg Oral QPM  . ceFEPime (MAXIPIME) IV  2 g Intravenous Q24H  . chlorhexidine  15 mL Mouth Rinse BID  . feeding supplement (ENSURE ENLIVE)  237 mL Oral BID BM  . furosemide  80 mg Intravenous Q12H  . gabapentin  300 mg Oral QHS  . insulin aspart  0-9 Units Subcutaneous Q4H  . lisinopril  5 mg Oral Daily  . mouth rinse  15 mL Mouth Rinse q12n4p  . metoprolol  75 mg Oral BID  . senna-docusate  1 tablet Oral QHS  . sodium chloride flush  3 mL Intravenous Q12H  . vancomycin  750 mg Intravenous Q12H   Infusions:  . amiodarone 60 mg/hr (06/11/2016 2017)  . amiodarone    . heparin 1,200 Units/hr (06/15/2016  1900)    Assessment: 82 yo M on Apixaban PTA for afib.  Initial plan was to transition to heparin for cardiac cath.  Now no plans for cath - med mgmt only.  Anti-Xa level elevated due to recent Apixaban dosing.  Currently aPTT therapeutic on heparin at 1200 units/hr.  Goal of Therapy:  Heparin level 0.3-0.7 units/ml aPTT 66-102 seconds Monitor platelets by anticoagulation protocol: Yes    Plan:  -Continue heparin at 1200 units/hr -Daily HL, CBC, aPTT -F/u transition to oral anticoagulation     Agapito GamesAlison Dawan Farney, PharmD, BCPS Clinical Pharmacist 06/14/2016 9:02 PM

## 2016-06-18 NOTE — Consult Note (Signed)
ELECTROPHYSIOLOGY CONSULT NOTE    Patient ID: Melvin [name] MRN: [id number], DOB/AGE: 1928-06-25 82 y.o.  Admit date: 06/19/2016 Date of Consult: 06/17/2016  Primary Physician: Colette RibasGOLDING, [name], MD Primary Cardiologist: Tresa EndoKelly Structural Heart: Clifton JamesMcAlhany Electrophysiologist: Ladona Ridgelaylor  Reason for Consultation: AF with RVR   HPI:  Melvin LappingBenjamin W Asman is a 82 y.o. male with a past medical history significant for CAD s/p CABG, aortic stenosis s/p TAVR, hypertension, hyperlipidemia, diabetes, persistent atrial fibrillation, and tachy brady syndrome s/p PPM implant 05/2016.  He presented on the day of admission with fever, hemoptysis, and shortness of breath. He has been found to have acute hypoxic respiratory failure 2/2 heart failure and pneumonia.  He is also being treated for UTI.  This afternoon, he went into a wide complex irregular rhythm and EP was called urgently to evaluate.  Device interrogation demonstrated AF with RVR and VS response pacing.  He was placed on IV amiodarone and underwent urgent cardioversion with initial restoration of SR but quickly returned to AF with RVR.  Repeat cardioversion was unsuccessful.  He is currently on Bipap and acutely ill.  Other ROS and history unable to be obtained.    Echo this admission demonstrated EF 20-25%, LA moderately dilated.  Past Medical History:  Diagnosis Date  . Age-related macular degeneration, dry, both eyes   . Arthritis    "just about all over my body"  . Carotid artery disease (HCC)    a. Carotid US 10/17: R 1-39; L 40-59  . Chronic combined systolic and diastolic CHF (congestive heart failure) (HCC)    a. 11/2015 Echo: EF 30-35%, diff HK, Gr1 DD.  Marland Kitchen. Chronic kidney disease   . Coronary artery disease    a. 1991 PTCA of LAD; b. 1998 CABG x 5 (LIMA->LAD, VG->RPDA->RPLA, VG->OM1, VG->D2; c. 09/2014 MV: lat ischemia, EF 37%; d. 10/2014 PCI: LAD 100, LCX 99ost/100p, RCA 100p, LIMA->LAD ok, VG->D2 ok, VG->OM1 95(3.0x15 Resolute  Integrity DES), VG->PDA ok w/ 30% w/in VG->PL. // d. LHC 9/17: grafts patent, med Rx continued  . GERD (gastroesophageal reflux disease)   . High cholesterol   . History of hiatal hernia   . Hypertension   . Ischemic cardiomyopathy    a. 11/2015 Echo: EF 30-35%, diff HK, Gr1 DD.  Marland Kitchen. LBBB (left bundle branch block)   . Osteoporosis   . PAD (peripheral artery disease) (HCC)    a. s/p prior rotational atherectomy of L pop and tib trunk and PTA of focal popliteal lesions; b. 03/2015 ABI: R - 0.88, L 0.96, LCFA 50-79%, RSFA 50-79% distal stenosis, 3 vessel runoff.  . Palpitations    a. 06/2012 Event monitor: freq ventricular ectopy w/o VT.  Marland Kitchen. Persistent atrial fibrillation (HCC)    a. CHADS2-VASc=6 // b. Eliquis started 11/17 // c. Amiodarone started 11/17  . PVC (premature ventricular contraction) 04/26/1997   on beta blocker/pt had cardio net monitor 06/23/2012  . RLS (restless legs syndrome) 10/01/2012  . Severe aortic stenosis    a. 11/2015 Echo: EF 30-35%, diff HK, Gr1 DD, mod AS, mild AI, mildly dil Ao root and Asc Ao, mild MR, mod dil LA, PASP 46mmHg. // b. Dob Echo 9/17 c/w severe AS // c. s/p TAVR 10/17 // d. Echo 05/01/16: Mod conc LVH, EF 45-50, Gr 1 DD, s/p TAVR with normal function, mean gradient 10 mmHg, mild MR, trivial PI  . Tachy-brady syndrome (HCC)    s/p CRT-P in 11/17  . Type II diabetes mellitus (HCC)   .  Unspecified hereditary and idiopathic peripheral neuropathy 10/01/2012     Surgical History:  Past Surgical History:  Procedure Laterality Date  . APPENDECTOMY    . CARDIAC CATHETERIZATION  04/26/1997   CAD - 50-60% prox to mid LAD with 70% stenosis in mid-distal, 70-80% 1st diag stenosis, 50-60% ostial stenosis in large 2nd diagonal; diffuse RCA stenosis with 40-50% osital narrowing with 70% prox stenosis (Dr. Bishop Limbo) >> CABG  . CARDIAC CATHETERIZATION  11/01/2014  . CARDIAC CATHETERIZATION N/A 04/05/2016   Procedure: Right/Left Heart Cath and Coronary/Graft  Angiography;  Surgeon: Lennette Bihari, MD;  Location: [facility] INVASIVE CV LAB;  Service: Cardiovascular;  Laterality: N/A;  . CARDIAC CATHETERIZATION N/A 05/11/2016   Procedure: Temporary Pacemaker;  Surgeon: Yates Decamp, MD;  Location: [facility] INVASIVE CV LAB;  Service: Cardiovascular;  Laterality: N/A;  . CATARACT EXTRACTION W/ INTRAOCULAR LENS  IMPLANT, BILATERAL Bilateral   . CORONARY ARTERY BYPASS GRAFT  04/1997   CABGx5 - LIMA to LAD, SVG to PDA, SVG to PLA, VG to OM1, VG to 2nd diagonal (Dr. Dorris Fetch)  . DOPPLER ECHOCARDIOGRAPHY  05/18/2012   EF 45-50%, LV systolic function mildly reduced; borderline LA enlargment; mild mitral annular calcif, mild-mod MR; mild TR with normal RVSP; mild calcification of AV leaflets and mild-mod valvular AS with mild regurg      . DOPPLER ECHOCARDIOGRAPHY  08/19/2011   EF 50-55%, mod conc LVH; mild mitral annular calcif with mild MR; mild-mod TR; AV mildly sclerotic with mild valvular AS and mild regurg, mild aortic root dilatation  . EP IMPLANTABLE DEVICE N/A 05/16/2016   Procedure: BiV Pacemaker Insertion CRT-P;  Surgeon: Marinus Maw, MD;  Location: [facility] INVASIVE CV LAB;  Service: Cardiovascular;  Laterality: N/A;  . HERNIA REPAIR    . HIATAL HERNIA REPAIR    . INGUINAL HERNIA REPAIR Bilateral   . LEFT AND RIGHT HEART CATHETERIZATION WITH CORONARY ANGIOGRAM N/A 11/01/2014   Procedure: LEFT AND RIGHT HEART CATHETERIZATION WITH CORONARY ANGIOGRAM;  Surgeon: Lennette Bihari, MD; EF35-40%, mild AS w/ 2+ AI, LAD, CFX, RCA 100%, LIMA-LAD OK, SVG-D1 OK, SVG-PDA 30%, SVG-OM 95%  . LOWER EXTREMITY ANGIOGRAM  02/05/2011   Diamondback orbital rotational atherectomy, percutaneous transluminal angioplasty of high-grade calcified popliteal & tibioperoneal stenosis (Dr. Erlene Quan)  . LOWER EXTREMITY ARTERIAL DOPPLER  02/2012   RLE - mild to mod arterial insuff; RSFA 50-69% diameter reduction; R pop 50-69% diameter reduction; posterior tibial (R) demonstrates occlusive disease; LSFA/prox  pop 0-49% diameter reduction; L distal pop 50-69% diameter reduction; posterior tibial (L) demonstrates occlusive disease  . NM MYOCAR PERF EJECTION FRACTION  09/2011   lexiscan - no reversible ischmai, fixed anteroseptal defect r/t LBBB; EF 51%; septal hypokinesis; low risk but abnormal  . PERCUTANEOUS CORONARY STENT INTERVENTION (PCI-S) N/A 11/02/2014   Procedure: PERCUTANEOUS CORONARY STENT INTERVENTION (PCI-S);  Surgeon: Lennette Bihari, MD; 3.015 mm Resolute integrity DES to the SVG-OM   . TEE WITHOUT CARDIOVERSION N/A 04/30/2016   Procedure: TRANSESOPHAGEAL ECHOCARDIOGRAM (TEE);  Surgeon: Kathleene Hazel, MD;  Location: [facility] OR;  Service: Open Heart Surgery;  Laterality: N/A;  . TONSILLECTOMY    . TRANSCATHETER AORTIC VALVE REPLACEMENT, TRANSFEMORAL N/A 04/30/2016   Procedure: TRANSCATHETER AORTIC VALVE REPLACEMENT, TRANSFEMORAL;  Surgeon: Kathleene Hazel, MD;  Location[facility] OR;  Service: Open Heart Surgery;  Laterality: N/A;     Prescriptions Prior to Admission  Medication Sig Dispense Refill Last Dose  . acetaminophen (TYLENOL) 325 MG tablet Take 325-650 mg by mouth every 6 (  six) hours as needed for mild pain or moderate pain.   Taking  . amiodarone (PACERONE) 200 MG tablet Take 1 tablet (200 mg total) by mouth daily. 30 tablet 3 07/07/2016 at Unknown time  . amLODipine (NORVASC) 10 MG tablet Take 1 tablet (10 mg total) by mouth daily. 30 tablet 12 06/24/2016 at Unknown time  . apixaban (ELIQUIS) 5 MG TABS tablet Take 1 tablet (5 mg total) by mouth 2 (two) times daily. 60 tablet 3 06/27/2016 at Unknown time  . aspirin 81 MG tablet Take 81 mg by mouth every evening.    06/27/2016 at Unknown time  . atorvastatin (LIPITOR) 40 MG tablet TAKE 1 TABLET DAILY AT 6 P.M. (KEEP OFFICE VISIT) 90 tablet 2 06/12/2016 at Unknown time  . cetirizine (ZYRTEC) 10 MG tablet Take 10 mg by mouth daily as needed for allergies.    06/30/2016 at Unknown time  . Dextromethorphan-Guaifenesin 5-100 MG/5ML LIQD  Give 15 cc by mouth every 6 hour prn   Taking  . gabapentin (NEURONTIN) 300 MG capsule Take 1 capsule (300 mg total) by mouth at bedtime. 90 capsule 3 06/12/2016 at Unknown time  . hydrALAZINE (APRESOLINE) 10 MG tablet Take 2 tablets (20 mg total) by mouth every 8 (eight) hours. 270 tablet 1 06/09/2016 at Unknown time  . lisinopril (PRINIVIL,ZESTRIL) 10 MG tablet Take 1 tablet (10 mg total) by mouth daily. 30 tablet 3 07/05/2016 at Unknown time  . metformin (FORTAMET) 1000 MG (OSM) 24 hr tablet Take 1,000 mg by mouth 2 (two) times daily with a meal.   06/25/2016 at Unknown time  . metoprolol (LOPRESSOR) 50 MG tablet Take 1 tablet (50 mg total) by mouth 2 (two) times daily. 60 tablet 3 06/14/2016 at 1000  . Multiple Vitamins-Minerals (PRESERVISION AREDS 2 PO) Take 1 capsule by mouth 2 (two) times daily.    07/03/2016 at Unknown time  . multivitamin-iron-minerals-folic acid (CENTRUM) chewable tablet Chew 1 tablet by mouth daily.   06/12/2016 at Unknown time  . niacin (NIASPAN) 750 MG CR tablet Take 2 tablets (1,500 mg total) by mouth at bedtime. 180 tablet 1 06/12/2016 at Unknown time  . nitroGLYCERIN (NITROSTAT) 0.4 MG SL tablet Place 1 tablet (0.4 mg total) under the tongue every 5 (five) minutes as needed for chest pain. 25 tablet 3 06/23/2016 at Unknown time  . oxymetazoline (AFRIN) 0.05 % nasal spray Place 2 sprays into both nostrils 3 (three) times daily as needed for congestion (Allergies).   Taking  . polycarbophil (FIBERCON) 625 MG tablet Take 1,250 mg by mouth daily.    07/02/2016 at Unknown time  . potassium chloride SA (K-DUR,KLOR-CON) 20 MEQ tablet Take 1 tablet (20 mEq total) by mouth daily. 30 tablet 12 06/12/2016 at Unknown time  . torsemide (DEMADEX) 20 MG tablet Take 40 mg by mouth daily.   06/10/2016 at Unknown time    Inpatient Medications:  . amiodarone  200 mg Oral Daily  . amLODipine  5 mg Oral Daily  . aspirin  81 mg Oral QPM  . ceFEPime (MAXIPIME) IV  2 g Intravenous Q24H  .  chlorhexidine  15 mL Mouth Rinse BID  . feeding supplement (ENSURE ENLIVE)  237 mL Oral BID BM  . furosemide  80 mg Intravenous Q12H  . gabapentin  300 mg Oral QHS  . insulin aspart  0-9 Units Subcutaneous Q4H  . lisinopril  5 mg Oral Daily  . mouth rinse  15 mL Mouth Rinse q12n4p  . metoprolol      .  metoprolol  75 mg Oral BID  . midazolam      . sodium chloride flush  3 mL Intravenous Q12H  . sodium chloride flush  3 mL Intravenous Q12H  . sodium chloride flush  3 mL Intravenous Q12H  . vancomycin  750 mg Intravenous Q12H    Allergies:  Allergies  Allergen Reactions  . Other     Patient is allergic to something but doesn't know the name.  . Pramipexole Swelling    Social History   Social History  . Marital status: Married    Spouse name: Maralyn Sago  . Number of children: 1  . Years of education: College   Occupational History  .  Retired   Social History Main Topics  . Smoking status: Never Smoker  . Smokeless tobacco: Never Used  . Alcohol use No  . Drug use: No  . Sexual activity: No   Other Topics Concern  . Not on file   Social History Narrative   Pt lives at home with his spouse.   Has been married 68yrs.   Caffeine ZOX:WRUEAV, is right handed     Family History  Problem Relation Age of Onset  . CAD Mother   . Heart attack Father      Review of Systems: All other systems reviewed and are otherwise negative except as noted above.  Physical Exam: Vitals:   06/20/2016 0324 06/28/2016 0403 07/02/2016 0750 06/24/2016 1230  BP:  126/77    Pulse: 63 69 66 79  Resp: (!) 28 (!) 30 (!) 26 (!) 36  Temp:      TempSrc:      SpO2: 97% 95% 96% 96%  Weight:  169 lb 14.4 oz (77.1 kg)    Height:        GEN- The patient is acutely ill appearing, on BiPap HEENT: normocephalic, atraumatic; sclera clear, conjunctiva pink; hearing intact; oropharynx clear; neck supple  Lungs- increased work of breathing, +Bipap Heart- Tachycardic irregular rate and rhythm  GI- soft,  non-tender, non-distended, bowel sounds present  Extremities- no clubbing, cyanosis, or edema  MS- no significant deformity or atrophy Skin- warm and dry, no rash or lesion Psych- euthymic mood, full affect Neuro- strength and sensation are intact  Labs:   Lab Results  Component Value Date   WBC 17.6 (H) 07/01/2016   HGB 9.7 (L) 06/26/2016   HCT 30.7 (L) 06/17/2016   MCV 95.0 06/17/2016   PLT 185 06/13/2016    Recent Labs Lab 06/16/16 0620  06/25/2016 0830  NA 141  < > 143  K 4.2  < > 3.7  CL 108  < > 102  CO2 26  < > 33*  BUN 17  < > 18  CREATININE 0.97  < > 1.05  CALCIUM 8.9  < > 8.9  PROT 5.8*  --   --   BILITOT 0.4  --   --   ALKPHOS 66  --   --   ALT 64*  --   --   AST 61*  --   --   GLUCOSE 159*  < > 135*  < > = values in this interval not displayed.    Radiology/Studies: US Abdomen Complete Result Date: 06/15/2016 CLINICAL DATA:  Renal failure EXAM: ABDOMEN ULTRASOUND COMPLETE COMPARISON:  04/15/2016 FINDINGS: Gallbladder: Hypoechoic intraluminal sludge layering in the gallbladder. No definite gallstones. Wall thickness measures 2.9 mm. No Murphy's sign elicited or visualized pericholecystic fluid. Common bile duct: Diameter: 4 mm Liver: Background echogenicity  is normal. Mild nodularity to the liver surface suggesting a component of cirrhosis. Patent hepatic and portal veins. Scattered echogenic lesions in the liver (2 visualized). Largest echogenic lesion has associated posterior acoustic shadowing compatible with calcification measuring 2.0 x 2.3 x 1.9 cm. This could represent complex calcified cyst, granulomata versus partially calcified hemangioma. IVC: No abnormality visualized. Pancreas: Visualized portion unremarkable. Spleen: Limited visualization because of bowel gas appear Right Kidney: Length: 10.3 cm. Slight increased echogenicity and cortical thinning. No hydronephrosis. Minimally complex hypoechoic cyst in the lower pole measures 2 cm. Left Kidney: Length:  10.0 cm. Normal echogenicity and preserved cortex. No hydronephrosis. Limited visualization because of bowel gas. Abdominal aorta: Obscured by bowel gas. Other findings: No free fluid. IMPRESSION: Gallbladder sludge without cholelithiasis or signs of cholecystitis. No biliary dilatation Nonspecific echogenic lesions in the liver, suspect hepatic granulomata versus partially calcified hemangiomas. These have a benign appearance. 2 cm right lower pole renal cyst Obscured aorta by bowel gas No free fluid or acute process by ultrasound Electronically Signed   By: Judie Petit.  Shick M.D.   On: 06/15/2016 10:05   Dg Chest Port 1 View Result Date: 06/07/2016 CLINICAL DATA:  Urinary tract infection.  Sepsis and fever. EXAM: PORTABLE CHEST 1 VIEW COMPARISON:  Single-view of the chest 06/17/2016 and 06/15/2016. FINDINGS: Extensive bilateral airspace disease, worse on the right persists but appears mildly improved. No pneumothorax or pleural effusion. There is cardiomegaly. IMPRESSION: Persistent but improved right worse than left airspace disease most compatible with pneumonia. Electronically Signed   By: Drusilla Kanner M.D.   On: 07/04/2016 09:04   EKG: AF with RVR, LBBB   TELEMETRY: AP/BiV pace with reversion to AF with RVR 1:14PM  DEVICE HISTORY: MDT CRTP implanted 05/2016 for tachy brady syndrome   Assessment/Plan: 1.  Persistent atrial fibrillation He has had return of atrial fibrillation likely 2/2 acute medical issues of HCAP, urosepsis, and respiratory failure.  He failed cardioversion twice today with ERAF. For now, would continue IV amiodarone and Metoprolol for rate control.   Continue IV Heparin for CHADS2VASC of 6  2.  Acute on chronic systolic heart failure Management per cardiology  3.  Acute hypoxic respiratory failure Management per primary team  4.  Urosepsis Management per primary team   His prognosis appears to be poor at this time, agree with palliative care consult.   Gypsy Balsam,  NP 06/27/2016 1:32 PM   I have seen and examined this patient with Gypsy Balsam.  Agree with above, note added to reflect my findings.  On exam, iRRR, tachycardic, no murmurs, lungs clear. Called to the room due to wide complex tachycardia.  Device interrogation shows atrial fibrillation with aberrancy. Attempted cardioversion without success, had ERAF. Plan for amiodarone loading and possibly repeat cardioversion attempt after amiodarone load. This may also improve as his sepsis improves.  Uel Davidow M. Abir Craine MD 06/19/2016 8:18 AM

## 2016-06-18 NOTE — Progress Notes (Signed)
Pt transported from 3W29 to 4N19 without incidence.

## 2016-06-18 NOTE — Progress Notes (Signed)
PT Cancellation Note  Patient Details Name: Melvin [name] MRN: [id number] DOB: [birth year]   Cancelled Treatment:    Reason Eval/Treat Not Completed: Patient not medically ready Pt transferred to step down. Will follow up as appropriate.   Blake DivineShauna A Giara Mcgaughey 2016-05-31, 2:40 PM Mylo RedShauna Aziel Morgan, PT, DPT [phone]

## 2016-06-18 NOTE — Progress Notes (Addendum)
Received patient from Onalee HuaAnne penn, [facility] Bipap on, sats 100% vital signs stable upon arrival, Heparin infusing in IV to Left hand.  Daughter to bedside; patient and daughter oriented to unit and room.  Call bell placed within reach, bed left in lowest position.  Will continue to monitor closely.

## 2016-06-19 ENCOUNTER — Inpatient Hospital Stay (HOSPITAL_COMMUNITY): Payer: Medicare Other

## 2016-06-19 DIAGNOSIS — I2489 Other forms of acute ischemic heart disease: Secondary | ICD-10-CM | POA: Diagnosis present

## 2016-06-19 DIAGNOSIS — I248 Other forms of acute ischemic heart disease: Secondary | ICD-10-CM

## 2016-06-19 DIAGNOSIS — Y95 Nosocomial condition: Secondary | ICD-10-CM

## 2016-06-19 DIAGNOSIS — J189 Pneumonia, unspecified organism: Secondary | ICD-10-CM | POA: Clinically undetermined

## 2016-06-19 DIAGNOSIS — I420 Dilated cardiomyopathy: Secondary | ICD-10-CM

## 2016-06-19 DIAGNOSIS — I447 Left bundle-branch block, unspecified: Secondary | ICD-10-CM

## 2016-06-19 DIAGNOSIS — I481 Persistent atrial fibrillation: Secondary | ICD-10-CM

## 2016-06-19 DIAGNOSIS — I2510 Atherosclerotic heart disease of native coronary artery without angina pectoris: Secondary | ICD-10-CM

## 2016-06-19 DIAGNOSIS — I4891 Unspecified atrial fibrillation: Secondary | ICD-10-CM | POA: Diagnosis not present

## 2016-06-19 DIAGNOSIS — J9601 Acute respiratory failure with hypoxia: Secondary | ICD-10-CM | POA: Diagnosis present

## 2016-06-19 DIAGNOSIS — I5043 Acute on chronic combined systolic (congestive) and diastolic (congestive) heart failure: Secondary | ICD-10-CM | POA: Clinically undetermined

## 2016-06-19 LAB — MAGNESIUM: Magnesium: 2.3 mg/dL (ref 1.7–2.4)

## 2016-06-19 LAB — GLUCOSE, CAPILLARY
GLUCOSE-CAPILLARY: 139 mg/dL — AB (ref 65–99)
GLUCOSE-CAPILLARY: 158 mg/dL — AB (ref 65–99)
GLUCOSE-CAPILLARY: 164 mg/dL — AB (ref 65–99)
GLUCOSE-CAPILLARY: 183 mg/dL — AB (ref 65–99)
GLUCOSE-CAPILLARY: 254 mg/dL — AB (ref 65–99)
Glucose-Capillary: 166 mg/dL — ABNORMAL HIGH (ref 65–99)

## 2016-06-19 LAB — CULTURE, BLOOD (ROUTINE X 2): CULTURE: NO GROWTH

## 2016-06-19 LAB — CBC
HCT: 29.8 % — ABNORMAL LOW (ref 39.0–52.0)
Hemoglobin: 9.3 g/dL — ABNORMAL LOW (ref 13.0–17.0)
MCH: 30.3 pg (ref 26.0–34.0)
MCHC: 31.2 g/dL (ref 30.0–36.0)
MCV: 97.1 fL (ref 78.0–100.0)
PLATELETS: 209 10*3/uL (ref 150–400)
RBC: 3.07 MIL/uL — ABNORMAL LOW (ref 4.22–5.81)
RDW: 15 % (ref 11.5–15.5)
WBC: 15 10*3/uL — AB (ref 4.0–10.5)

## 2016-06-19 LAB — COMPREHENSIVE METABOLIC PANEL
ALBUMIN: 2.2 g/dL — AB (ref 3.5–5.0)
ALT: 110 U/L — ABNORMAL HIGH (ref 17–63)
ANION GAP: 10 (ref 5–15)
AST: 103 U/L — ABNORMAL HIGH (ref 15–41)
Alkaline Phosphatase: 84 U/L (ref 38–126)
BILIRUBIN TOTAL: 0.4 mg/dL (ref 0.3–1.2)
BUN: 25 mg/dL — ABNORMAL HIGH (ref 6–20)
CALCIUM: 8.4 mg/dL — AB (ref 8.9–10.3)
CO2: 30 mmol/L (ref 22–32)
Chloride: 102 mmol/L (ref 101–111)
Creatinine, Ser: 1.15 mg/dL (ref 0.61–1.24)
GFR calc non Af Amer: 56 mL/min — ABNORMAL LOW (ref >60)
Glucose, Bld: 227 mg/dL — ABNORMAL HIGH (ref 65–99)
POTASSIUM: 3.6 mmol/L (ref 3.5–5.1)
Sodium: 142 mmol/L (ref 135–145)
TOTAL PROTEIN: 5.6 g/dL — AB (ref 6.5–8.1)

## 2016-06-19 LAB — COOXEMETRY PANEL
Carboxyhemoglobin: 0.7 % (ref 0.5–1.5)
Methemoglobin: 0.8 % (ref 0.0–1.5)
O2 SAT: 69.2 %
TOTAL HEMOGLOBIN: 9.9 g/dL — AB (ref 12.0–16.0)

## 2016-06-19 LAB — APTT: APTT: 67 s — AB (ref 24–36)

## 2016-06-19 LAB — BRAIN NATRIURETIC PEPTIDE: B NATRIURETIC PEPTIDE 5: 1369.4 pg/mL — AB (ref 0.0–100.0)

## 2016-06-19 LAB — TSH: TSH: 4.269 u[IU]/mL (ref 0.350–4.500)

## 2016-06-19 LAB — HEPARIN LEVEL (UNFRACTIONATED): Heparin Unfractionated: 1 IU/mL — ABNORMAL HIGH (ref 0.30–0.70)

## 2016-06-19 MED ORDER — THROMBIN 5000 UNITS EX SOLR
5000.0000 [IU] | Freq: Once | CUTANEOUS | Status: DC
  Filled 2016-06-19: qty 5000

## 2016-06-19 MED ORDER — "THROMBI-PAD 3""X3"" EX PADS"
1.0000 | MEDICATED_PAD | Freq: Once | CUTANEOUS | Status: AC
  Administered 2016-06-19: 1 via TOPICAL
  Filled 2016-06-19: qty 1

## 2016-06-19 MED ORDER — METOLAZONE 5 MG PO TABS
5.0000 mg | ORAL_TABLET | Freq: Every day | ORAL | Status: DC
  Administered 2016-06-19 – 2016-06-20 (×2): 5 mg via ORAL
  Filled 2016-06-19 (×2): qty 1

## 2016-06-19 MED ORDER — MORPHINE SULFATE (PF) 2 MG/ML IV SOLN
1.0000 mg | INTRAVENOUS | Status: DC | PRN
  Administered 2016-06-19: 2 mg via INTRAVENOUS

## 2016-06-19 MED ORDER — FUROSEMIDE 10 MG/ML IJ SOLN
120.0000 mg | Freq: Two times a day (BID) | INTRAVENOUS | Status: DC
  Administered 2016-06-19 – 2016-06-20 (×3): 120 mg via INTRAVENOUS
  Filled 2016-06-19 (×5): qty 12

## 2016-06-19 MED ORDER — MORPHINE SULFATE (PF) 2 MG/ML IV SOLN
INTRAVENOUS | Status: AC
  Filled 2016-06-19: qty 1

## 2016-06-19 MED ORDER — APIXABAN 5 MG PO TABS
5.0000 mg | ORAL_TABLET | Freq: Two times a day (BID) | ORAL | Status: DC
  Administered 2016-06-19 – 2016-06-20 (×3): 5 mg via ORAL
  Filled 2016-06-19 (×3): qty 1

## 2016-06-19 NOTE — Progress Notes (Addendum)
ANTICOAGULATION CONSULT NOTE - Follow Up Consult  Pharmacy Consult for Heparin (Apixaban on hold) Indication: atrial fibrillation and chest pain/ACS  Allergies  Allergen Reactions  . Other     Patient is allergic to something but doesn't know the name.  . Pramipexole Swelling    Patient Measurements: Height: 5\' 6"  (167.6 cm) Weight: 166 lb 0.1 oz (75.3 kg) IBW/kg (Calculated) : 63.8 Heparin Dosing Weight: 77.1 kg  Vital Signs: Temp: 97.8 F (36.6 C) (12/13 0800) Temp Source: Oral (12/13 0800) BP: 101/67 (12/13 0800) Pulse Rate: 138 (12/13 0800)  Labs:  Recent Labs  06/17/16 0509  06/17/16 1337 06/17/16 1939 06/17/16 1940 May 16, 2016 0830 May 16, 2016 2000 06/19/16 0410  HGB  --   --   --   --   --  9.7*  --  9.3*  HCT  --   --   --   --   --  30.7*  --  29.8*  PLT  --   --   --   --   --  185  --  209  APTT  --   < > 38* 53*  --  86* 76* 67*  HEPARINUNFRC  --   < > 2.62*  --  2.20* 1.70*  --  1.00*  CREATININE 0.88  --   --   --   --  1.05  --  1.15  TROPONINI  --   < > 0.56* 0.69*  --   --  0.64*  --   < > = values in this interval not displayed.  Estimated Creatinine Clearance: 41.6 mL/min (by C-G formula based on SCr of 1.15 mg/dL).   Medications:  Scheduled:  . aspirin  81 mg Oral QPM  . ceFEPime (MAXIPIME) IV  2 g Intravenous Q24H  . chlorhexidine  15 mL Mouth Rinse BID  . feeding supplement (ENSURE ENLIVE)  237 mL Oral BID BM  . furosemide  120 mg Intravenous BID  . gabapentin  300 mg Oral QHS  . insulin aspart  0-9 Units Subcutaneous Q4H  . mouth rinse  15 mL Mouth Rinse q12n4p  . metolazone  5 mg Oral Daily  . metoprolol  75 mg Oral BID  . senna-docusate  1 tablet Oral QHS  . sodium chloride flush  3 mL Intravenous Q12H  . vancomycin  750 mg Intravenous Q12H   Infusions:  . amiodarone 30 mg/hr (06/19/16 0423)  . heparin 1,200 Units/hr (May 16, 2016 1900)    Assessment: 82 yo M on Apixaban PTA for afib. Patient noted with afib w. RVR on 12/13 and  attempted cardioversion x2.  He continues on heparin and remains at goal. Plans to restart apixiban today  Goal of Therapy:  Heparin level 0.3-0.7 units/ml aPTT 66-102 seconds Monitor platelets by anticoagulation protocol: Yes    Plan:  -Discontinue heparin -resume apixiban 5mg  po bid  [name], Pharm D 06/19/2016 8:46 AM

## 2016-06-19 NOTE — Progress Notes (Signed)
CRITICAL VALUE ALERT  Critical value received:  Troponin 0.64 Date of notification:  06/11/2016  Time of notification:  2104  Critical value read back:Yes.    Nurse who received alert:  Dorna LeitzAmanda Guffey RN  MD notified (1st page):  Dr. Merrilee JanskyMandawat  Time of first page:  2105   Responding MD:  Dr. Merrilee JanskyMandawat  Time MD responded:  2117  Also spoke with MD about po meds Lopressor and pt continues to be on bipap. bp soft. Held pm dose. MD aware will continue to monitor.

## 2016-06-19 NOTE — Progress Notes (Signed)
Daily Progress Note   Patient Name: Melvin [name]       Date: 06/19/2016 DOB: 1927/10/25  Age: 82 y.o. MRN#: [id number] Attending Physician: Jonetta Osgood, MD Primary Care Physician: Purvis Kilts, MD Admit Date: 06/16/2016  Reason for Consultation/Follow-up: Disposition, Establishing goals of care and Psychosocial/spiritual support  Subjective: Melvin [name] was more alert today and able to fully participate in the conversation I had with him, his wife, and his daughter at his bedside. He was able to come off the BiPAP early this morning, and has been tolerating NRB mask. He does endorse some generalized body aches, but otherwise denies any acute issues.   Length of Stay: 6  Current Medications: Scheduled Meds:  . apixaban  5 mg Oral BID  . aspirin  81 mg Oral QPM  . ceFEPime (MAXIPIME) IV  2 g Intravenous Q24H  . chlorhexidine  15 mL Mouth Rinse BID  . feeding supplement (ENSURE ENLIVE)  237 mL Oral BID BM  . furosemide  120 mg Intravenous BID  . gabapentin  300 mg Oral QHS  . insulin aspart  0-9 Units Subcutaneous Q4H  . mouth rinse  15 mL Mouth Rinse q12n4p  . metolazone  5 mg Oral Daily  . metoprolol  75 mg Oral BID  . senna-docusate  1 tablet Oral QHS  . sodium chloride flush  3 mL Intravenous Q12H  . vancomycin  750 mg Intravenous Q12H    Continuous Infusions: . amiodarone 30 mg/hr (06/19/16 0423)    PRN Meds: acetaminophen, guaiFENesin-dextromethorphan, morphine injection, ondansetron **OR** ondansetron (ZOFRAN) IV, sodium chloride, sodium chloride flush, zolpidem  Physical Exam      Constitutional: He is oriented to person, place, and time.  Frail elderly man in bed with NRB mask  HENT:  Head: Normocephalic and atraumatic.  Eyes: EOM are normal.  Neck: Normal range of motion.  Cardiovascular:    Heart rate improved, consistently <100 when I was in the room. He denies chest pain. Pulmonary/Chest:  NRB mask, pt endorses feeling short of breath but better compared to yesterday Abdominal: Soft. Normal appearance.  Musculoskeletal: Normal range of motion.  Neurological: He is alert and oriented to person, place, and time.  Skin: Skin is warm and dry. There is pallor.  Psychiatric: He has a normal mood and affect. Judgment and thought content normal. Cognition and memory are normal.      Vital Signs: BP (!) 119/59 (BP Location: Left Leg)   Pulse 99   Temp 98.4 F (36.9 C) (Oral)   Resp (!) 28   Ht '5\' 6"'$  (1.676 m)   Wt 75.3 kg (166 lb 0.1 oz)   SpO2 95%   BMI 26.79 kg/m  SpO2: SpO2: 95 % O2 Device: O2 Device: NRB O2 Flow Rate: O2 Flow Rate (L/min): 15 L/min  Intake/output summary:  Intake/Output Summary (Last 24 hours) at 06/19/16 1228 Last data filed at 06/19/16 1052  Gross per 24 hour  Intake          1229.86 ml  Output             1050 ml  Net           179.86 ml  LBM: Last BM Date: 06/16/16 Baseline Weight: Weight: 74.4 kg (164 lb) Most recent weight: Weight: 75.3 kg (166 lb 0.1 oz)  Flowsheet Rows   Flowsheet Row Most Recent Value  Intake Tab  Referral Department  Hospitalist  Unit at Time of Referral  Cardiac/Telemetry Unit  Palliative Care Primary Diagnosis  Cardiac  Date Notified  06/15/2016  Palliative Care Type  New Palliative care  Reason for referral  Clarify Goals of Care  Date of Admission  06/09/2016  # of days IP prior to Palliative referral  5  Clinical Assessment  Psychosocial & Spiritual Assessment  Palliative Care Outcomes  Patient/Family meeting held?  Yes  Who was at the meeting?  Met with wife and daughter outside of the room, and with pt in his room  Palliative Care Outcomes  Provided psychosocial or spiritual support, Clarified goals of care      Patient Active Problem List   Diagnosis Date Noted  . Atrial fibrillation with rapid  ventricular response ([place]) 06/19/2016  . Congestive dilated cardiomyopathy ([place]) - with recent drop in EF  06/19/2016  . Acute on chronic combined systolic and diastolic CHF, NYHA class 3 ([place]) 06/19/2016  . Acute respiratory failure with hypoxia ([place]) 06/19/2016  . HAP (hospital-acquired pneumonia) 06/19/2016  . Demand ischemia of myocardium ([place]) 06/19/2016  . Fever   . Goals of care, counseling/discussion   . Palliative care encounter   . Sepsis ([place]) 06/17/2016  . Hyponatremia 06/15/2016  . Acute renal failure ([place]) 06/18/2016  . Elevated troponin 06/08/2016  . Anemia 06/19/2016  . Thrombocytopenia ([place]) 06/17/2016  . Status post biventricular pacemaker 05/20/2016  . Chronic combined systolic and diastolic CHF (congestive heart failure) ([place]) 05/20/2016  . Persistent atrial fibrillation ([place]) 05/16/2016  . Complete heart block ([place]) 05/11/2016  . S/P TAVR (transcatheter aortic valve replacement) 05/11/2016  . Skin lesion 02/11/2015  . Coronary artery disease involving native heart without angina pectoris 09/28/2014  . SOB (shortness of breath) 04/26/2014  . Tachycardia-bradycardia syndrome ([place]) 04/26/2014  . Non-insulin dependent type 2 diabetes mellitus ([place]) 07/02/2013  . Osteoporosis, unspecified 06/24/2013  . Edema, lower extremity 06/22/2013  . LBBB (left bundle branch block) 12/01/2012  . Heart palpitations 12/01/2012  . Unspecified hereditary and idiopathic peripheral neuropathy 10/01/2012  . RLS (restless legs syndrome) 10/01/2012  . Essential hypertension 11/26/2011  . HLD (hyperlipidemia) 11/26/2011  . Liver lesion 11/26/2011    Palliative Care Assessment & Plan   HPI: 82 y.o. male  with past medical history of CAD s/p CABG, aortic stenosis s/p TAVR, hypertension, hyperlipidemia, diabetes, persistent atrial fibrillation, and tachy brady syndrome s/p PPM implant 05/2016. He was admitted on 07/02/2016 with fever, hemoptysis, and shortness of breath. Work-up revealed  Urosepsis,  Acute on chronic systolic heart failure (EF 20-25%, prior echo in November with EF 45-50%), and Pneumonia with resultant acute respiratory failure requiring BiPAP. He has also developed A. Fib with RVR with unsuccessful cardioversion.   Assessment: Melvin [name] was able to come off the BiPAP and onto NRB early this morning. He does seem to be tolerating this well with stable O2 saturation. His heart rate has also been better controlled. His overall clinical picture, however, remains quite poor.   I had met with him and his family on 12/12 for the first time, and they agreed that the interventions pursued at the hospital should be focused on promoting quality of life. For Mr. Richer, this would mean time with his wife at home with the ability to  continue his wood-working hobby. Today, we discussed that his goal--of being home with his wife--should be central to decision making. He and his family need to consider if the interventions available will help him achieve that goal. To that end, we talked about the plan to continue to diurese and provide supportive care. We also discussed that if he should worsen, require return to BiPAP, or simply just not improve, that would require a change in how we approach care (i.e moving towards a comfort based approach because we cannot fix the underlying issues). He and his family were very thoughtful during this conversation, and just asked that providers continue to be optimistic and realistic with them. I did not have a chance to address his partial code status during this meeting, as he became increasingly tired during our conversation.  Recommendations/Plan:  Continue to treat what is treatable with full scope care. Plan to meet with them again tomorrow for daily discussions on how he is doing, any changes, and current plan. They all do seem to grasp the seriousness of his situation, but will need ongoing support.  He remains a partial code, which I  will work to discuss with him and his family at our next meeting.   Code Status:  DNR/DNI, does want defibrillation, ACLS medications, and BiPAP  Prognosis:   Unable to determine; overall poor prognosis  Discharge Planning:  To Be Determined  Care plan was discussed with pt, pt's wife and daughter, care nurse  Thank you for allowing the Palliative Medicine Team to assist in the care of this patient.   Time In: 1200 Time Out: 1240 Total Time 40 minutes Prolonged Time Billed  no       Greater than 50%  of this time was spent counseling and coordinating care related to the above assessment and plan.  Charlynn Court, NP Palliative Medicine Team Team Phone # [phone]

## 2016-06-19 NOTE — Progress Notes (Addendum)
Patient Name: Melvin [name] Date of Encounter: 06/19/2016  Primary Cardiologist: Dr. Tresa EndoKelly PCP: Colette RibasGOLDING, Melvin CABOT, MD  PRIMARY ATTENDING: Dr. Jerral RalphGhimire   Patient Profile     Melvin [name] is a 82 year old male with a past medical history of CAD s/p CABG, ischemic cardiomyopathy, DM, and aortic stenosis s/p TAVR in Oct. 2017. Also developed CHB in Nov. 2017 and had [employer] BiV Pacemaker inserted. Admitted on 06/14/2016 from home (discharged from SNF the day prior), with fever, productive cough and rigors. Fever was 104 rectally on presentation, treated for urosepsis.   Echo done after TAVR shows EF of 45-50%, Echo done yesterday shows EF of 20-25%.  CXR with diffuse infiltrates - concern for HCAP & A on C Combined CHF. -- However - CoOx yesterday prior to Afib RVR was 70% that argues against the primary cause for respiratory failure being cardiac - more c/w PNA.  Is s/p BiVPPM (CRT-P) in November (post TAVR) for CHB --> however now in Afib RVR with aberrancy (wide-complex irregular tachycardia)  Hospital Problem List     Principal Problem:   Sepsis [facility](HCC) Active Problems:   Coronary artery disease involving native heart without angina pectoris   S/P TAVR (transcatheter aortic valve replacement)   Persistent atrial fibrillation (HCC)   Chronic combined systolic and diastolic CHF (congestive heart failure) (HCC)   Fever   Atrial fibrillation with rapid ventricular response (HCC)   Congestive dilated cardiomyopathy (HCC) - with recent drop in EF    Acute on chronic combined systolic and diastolic CHF, NYHA class 3 (HCC)   Acute respiratory failure with hypoxia (HCC)   HAP (hospital-acquired pneumonia)   Demand ischemia of myocardium (HCC)   Essential hypertension   LBBB (left bundle branch block)   Acute renal failure (HCC)   HLD (hyperlipidemia)   Non-insulin dependent type 2 diabetes mellitus (HCC)   Hyponatremia   Elevated troponin   Anemia   Thrombocytopenia (HCC)  Goals of care, counseling/discussion   Palliative care encounter    Subjective   Does not remember yesterday's events (probably due to sedation) Breathing a bit better - now on NRB FM "Feels miserable" -aches all over, but no angina  Inpatient Medications    Scheduled Meds: . aspirin  81 mg Oral QPM  . ceFEPime (MAXIPIME) IV  2 g Intravenous Q24H  . chlorhexidine  15 mL Mouth Rinse BID  . feeding supplement (ENSURE ENLIVE)  237 mL Oral BID BM  . furosemide  80 mg Intravenous Q12H  . gabapentin  300 mg Oral QHS  . insulin aspart  0-9 Units Subcutaneous Q4H  . lisinopril  5 mg Oral Daily  . mouth rinse  15 mL Mouth Rinse q12n4p  . metoprolol  75 mg Oral BID  . senna-docusate  1 tablet Oral QHS  . sodium chloride flush  3 mL Intravenous Q12H  . vancomycin  750 mg Intravenous Q12H   Continuous Infusions: . amiodarone 30 mg/hr (06/19/16 0423)  . heparin 1,200 Units/hr (06/27/2016 1900)   PRN Meds: acetaminophen, guaiFENesin-dextromethorphan, ondansetron **OR** ondansetron (ZOFRAN) IV, sodium chloride, sodium chloride flush, zolpidem   Vital Signs    Vitals:   06/19/16 0500 06/19/16 0600 06/19/16 0700 06/19/16 0732  BP: (!) 116/42 108/78 99/67   Pulse: (!) 119 (!) 118 (!) 126   Resp: (!) 38 (!) 34 (!) 29   Temp:      TempSrc:      SpO2: 94% 94% 91% 92%  Weight:  Height:        Intake/Output Summary (Last 24 hours) at 06/19/16 0736 Last data filed at 06/19/16 0700  Gross per 24 hour  Intake          1001.16 ml  Output             1050 ml  Net           -48.84 ml   Filed Weights   06/08/2016 0403 06/16/2016 1400 06/19/16 0327  Weight: 77.1 kg (169 lb 14.4 oz) 74.5 kg (164 lb 3.9 oz) 75.3 kg (166 lb 0.1 oz)    Physical Exam   GEN: Frail elderly man - in some distress HEENT: Grossly normal. NRB mask in place Neck: Supple, + JVD, carotid bruits, or masses. Cardiac: Irreg-Irreg, tachycardic with split s2 & SEM @ RUSB; ~1+ edema Respiratory:  Diffuse coarse rhonchi  & rales GI: Soft, nontender, nondistended, BS + x 4. Skin: warm and dry, no rash. Neuro:  Strength and sensation are intact. Psych: AAOx3.  Normal affect.  Labs    CBC  Recent Labs  06/16/2016 0830 06/19/16 0410  WBC 17.6* 15.0*  HGB 9.7* 9.3*  HCT 30.7* 29.8*  MCV 95.0 97.1  PLT 185 209   Basic Metabolic Panel  Recent Labs  06/17/16 0802 06/28/2016 0830 06/19/16 0410  NA  --  143 142  K  --  3.7 3.6  CL  --  102 102  CO2  --  33* 30  GLUCOSE  --  135* 227*  BUN  --  18 25*  CREATININE  --  1.05 1.15  CALCIUM  --  8.9 8.4*  MG 1.7  --  2.3   Liver Function Tests  Recent Labs  06/19/16 0410  AST 103*  ALT 110*  ALKPHOS 84  BILITOT 0.4  PROT 5.6*  ALBUMIN 2.2*   No results for input(s): LIPASE, AMYLASE in the last 72 hours. Cardiac Enzymes  Recent Labs  06/17/16 1337 06/17/16 1939 06/10/2016 2000  TROPONINI 0.56* 0.69* 0.64*   BNP Invalid input(s): POCBNP D-Dimer No results for input(s): DDIMER in the last 72 hours. Hemoglobin A1C No results for input(s): HGBA1C in the last 72 hours. Fasting Lipid Panel No results for input(s): CHOL, HDL, LDLCALC, TRIG, CHOLHDL, LDLDIRECT in the last 72 hours. Thyroid Function Tests  Recent Labs  06/19/16 0413  TSH 4.269    Telemetry    afib with aberrancey - rates in 120s - Personally Reviewed  ECG    Afib RVR with aberrancy & intermittent V pacing - rate 118, LBBB & LAD (-34) Personally Reviewed  Radiology    Personally reviewed - not much improvement Dg Chest Port 1 View  Result Date: 06/19/2016 CLINICAL DATA:  CHF. EXAM: PORTABLE CHEST 1 VIEW COMPARISON:  06/20/2016 . FINDINGS: AICD noted stable position. Prior CABG. Prior cardiac valve repair. Cardiomegaly. Diffuse bilateral pulmonary infiltrates/edema, minimal improvement from prior exam. Bibasilar atelectasis. Bilateral pleural effusions. No pneumothorax. IMPRESSION: 1.  AICD in stable position.  Prior CABG and cardiac valve repair. 2.  Cardiomegaly with diffuse bilateral pulmonary infiltrates/edema, minimal improvement from prior exam. 3.  Bibasilar atelectasis. Electronically Signed   By: Maisie Fus  Register   On: 06/19/2016 06:50   Dg Chest Port 1 View  Result Date: 06/07/2016 CLINICAL DATA:  Right-sided central line placement. EXAM: PORTABLE CHEST 1 VIEW COMPARISON:  08/20/2015 at 8:51 a.m. FINDINGS: A new right-sided PICC line is present. The tip is thought to be at the cavoatrial junction but  is somewhat obscured by 3 pacer leads directly overlying it. Prior CABG observed along with a aortic valve repair. Low lung volumes are present with moderate enlargement of the cardiopericardial silhouette and patchy airspace opacities in both lungs favoring the mid lungs and lung bases. Atherosclerotic calcification of the aortic arch noted. IMPRESSION: 1. Right-sided PICC line tip: Cavoatrial junction. No pneumothorax or complicating feature. 2. Enlargement of the cardiopericardial silhouette with suspected pulmonary edema. Low lung volumes. 3. Prior CABG.  Aortic valve repair. Electronically Signed   By: Gaylyn RongWalter  Liebkemann M.D.   On: 06/22/2016 19:50   Dg Chest Port 1 View  Result Date: 06/08/2016 CLINICAL DATA:  Urinary tract infection.  Sepsis and fever. EXAM: PORTABLE CHEST 1 VIEW COMPARISON:  Single-view of the chest 06/17/2016 and 06/15/2016. FINDINGS: Extensive bilateral airspace disease, worse on the right persists but appears mildly improved. No pneumothorax or pleural effusion. There is cardiomegaly. IMPRESSION: Persistent but improved right worse than left airspace disease most compatible with pneumonia. Electronically Signed   By: Drusilla Kannerhomas  Dalessio M.D.   On: 07/07/2016 09:04    Cardiac Studies   Echo: EF 20-25% - severe diffuse hypokinesis.; drop in EF from 40-45% in late November.  Assessment & Plan    Principal Problem:   Sepsis (HCC) / Fever- Abx per TRH - presumed UTI & now with likley HAP (hospital-acquired  pneumonia)  On vanc/Cefipime  To avoid hypotension, will d/c Amlodipine & ACE-I (esp with RVR) Active Problems:   Coronary artery disease involving native heart without angina pectoris w / Elevated troponin/Demand ischemia of myocardium (HCC) Cath pre-TAVR with patent grafts, suspect + Troponin is related to Demand Ischemia in setting of sepsis, but cannot exclude new obstructive CAD given drop in EF (although stress-induced CM is also quite likely)  Original plan on Txfr was R&LHC - cancelled yesterday due to respiratory distress & Afib RVR - w    S/P TAVR (transcatheter aortic valve replacement) - valve appears to be functioning well on Echo despite low EF.   Persistent atrial fibrillation (HCC) -- Atrial fibrillation with rapid ventricular response (HCC)  Unsuccessful DCCV x 2 yesterday (appreciate EP assistance  - Dr. Elberta Fortisamnitz)  Continue Amiodarone IV - may re-attempt DCCV in the next few days     Congestive dilated cardiomyopathy (HCC) - with recent drop in EF  /Acute on chronic combined systolic and diastolic CHF, NYHA class 3 (HCC) - was ~7-10 lb above TAVR d/c wgt.  Continue BiPAP & aggressive diuresis - will increase Lasix dose & add metolazone  Concentrate IV meds - convert from IV Heparin to Eliquis (if no invasive procedures planned, we can convert back to Eliquis)  Not enough BP for ACE-I, but continue BB for rate control (will need to give PO vs. Convert to IV intermittent dosing)  ? Cause of drop in EF - ischemic vs. Stress induced vs. PPM related      Acute respiratory failure with hypoxia (HCC) - likely related to combined CHF & HAP: on BiPAP (is DNI)  Minimal improvement on CXR  On Abx per TRH  Aggressive diuresis  Hopefully with improved  Palliative Care Team consulted & extensive EOL discussions made  He is very weak - would probably put him back on BiPAP for Rest & QHS to rest him         Essential hypertension - hold amlodipine to allow for better rate  control & avoid hypotension.   LBBB (left bundle branch block) - makes Afib appear to be  VT - confirmed by PPM interrogation to be Afib RVR   Acute renal failure (HCC) - resolved.       Palliative care encounter /  Goals of care, counseling/discussion- goals of care discussed (Dr. Tresa Endo also had long talk with family)  The patient is critically ill with multiple organ systems failure and requires high complexity decision making for assessment and support, frequent evaluation and titration of therapies, application of advanced monitoring technologies and extensive interpretation of multiple databases.   Critical Care Time devoted to patient care services described in this note is  45 Minutes. This time reflects time of care of this signee Bryan Lemma, MD   Signed, Bryan Lemma, MD  06/19/2016, 7:36 AM

## 2016-06-19 NOTE — Evaluation (Signed)
Physical Therapy Evaluation Patient Details Name: Melvin [name] MRN: [id number] DOB: Sep 18, 1927 Today's Date: 06/19/2016   History of Present Illness  Pt is an 82 y/o male admitted secondary to fever. PMH including but not limited to CAD s/p CABG (1998), VHD s/p TAVR (04/30/16) with post procedure PAF, pacemaker placement (05/16/16), HTN, DM and CHF.  Clinical Impression  Pt presented supine in bed with HOB elevated, awake and willing to participate in therapy session. Prior to admission, pt reported that he was very briefly at home following his rehab stay. He stated that he ambulated with use of SPC and with independent with bathing and dressing. During evaluation, pt performed supine <> sit transfers with min guard for safety and use of bed rails. Pt fatigued quickly in sitting EOB. After five minutes, pt reporting that he needed to lie back down secondary to weakness and fatigue. Pt's HR fluctuated between 120-145 bpm throughout session. Of note, his SPO2 decreased to 84% x1 with sitting EOB. PT demonstrated and instructed pt in pursed lip breathing, with good return pt's SPO2 increased to 92%. Pt would continue to benefit from skilled physical therapy services at this time while admitted and after d/c to address his below listed limitations in order to improve his overall safety and independence with functional mobility.     Follow Up Recommendations SNF    Equipment Recommendations  None recommended by PT    Recommendations for Other Services OT consult     Precautions / Restrictions Precautions Precautions: Fall Precaution Comments: pacemaker placement on 05/16/16, defibrillator pads in place during eval Restrictions Weight Bearing Restrictions: No      Mobility  Bed Mobility Overal bed mobility: Needs Assistance Bed Mobility: Rolling;Sit to Supine;Supine to Sit Rolling: Supervision   Supine to sit: Min guard Sit to supine: Min guard   General bed mobility comments:  pt required increased time, use of bed rails, min guard for safety  Transfers                 General transfer comment: deferred during evaluation secondary to fatigue, weakness and dyspnea  Ambulation/Gait                Stairs            Wheelchair Mobility    Modified Rankin (Stroke Patients Only)       Balance Overall balance assessment: Needs assistance Sitting-balance support: Feet unsupported;Bilateral upper extremity supported Sitting balance-Leahy Scale: Poor Sitting balance - Comments: pt reliant on bilateral UEs on bed rail and bed, min guard for safety. Pt's HR in sitting fluctuating between 120-145 bpm. Pt's SPO2 decreased to 84% x1 with sitting EOB, but with demonstration and good return in pursed lip breathing pt's SPO2 increased to 92%. Pt tolerated sitting upright at EOB for 5 minutes and was very fatigued and stated that he needed to lie back down.                                     Pertinent Vitals/Pain Pain Assessment: No/denies pain    Home Living Family/patient expects to be discharged to:: Private residence Living Arrangements: Spouse/significant other Available Help at Discharge: Family;Available 24 hours/day Type of Home: House Home Access: Stairs to enter Entrance Stairs-Rails: Left Entrance Stairs-Number of Steps: 14 Home Layout: One level Home Equipment: Cane - single point;Walker - 2 wheels;Shower seat;Toilet riser      Prior Function  Level of Independence: Independent with assistive device(s)         Comments: pt ambulated with use of SPC. he reported that prior to admission, he was independent with dressing and bathing.     Hand Dominance        Extremity/Trunk Assessment   Upper Extremity Assessment Upper Extremity Assessment: Generalized weakness    Lower Extremity Assessment Lower Extremity Assessment: Generalized weakness    Cervical / Trunk Assessment Cervical / Trunk Assessment:  Kyphotic  Communication   Communication: HOH  Cognition Arousal/Alertness: Awake/alert Behavior During Therapy: WFL for tasks assessed/performed Overall Cognitive Status: Within Functional Limits for tasks assessed                      General Comments      Exercises     Assessment/Plan    PT Assessment Patient needs continued PT services  PT Problem List Decreased strength;Decreased range of motion;Decreased activity tolerance;Decreased balance;Decreased mobility;Decreased coordination;Cardiopulmonary status limiting activity          PT Treatment Interventions Gait training;DME instruction;Stair training;Functional mobility training;Therapeutic activities;Therapeutic exercise;Balance training;Neuromuscular re-education;Patient/family education    PT Goals (Current goals can be found in the Care Plan section)  Acute Rehab PT Goals Patient Stated Goal: to get stronger PT Goal Formulation: With patient Time For Goal Achievement: 07/03/16 Potential to Achieve Goals: Fair    Frequency Min 3X/week   Barriers to discharge        Co-evaluation               End of Session Equipment Utilized During Treatment: Oxygen;Other (comment) (mask) Activity Tolerance: Patient limited by fatigue Patient left: in bed;with call bell/phone within reach Nurse Communication: Mobility status         Time: [id number] PT Time Calculation (min) (ACUTE ONLY): 20 min   Charges:   PT Evaluation $PT Eval Moderate Complexity: 1 Procedure     PT G CodesAlessandra Bevels:        Lateia Fraser M Evett Kassa 06/19/2016, 11:10 AM Deborah ChalkJennifer Mel Tadros, PT, DPT [phone]

## 2016-06-19 NOTE — Progress Notes (Signed)
Pts PICC was oozing overnight. MD made aware and thrombi pad ordered. Placed thrombi pad using sterile technique to the site and placed a new dressing. Will change the dressing at a later time once bleeding is under control and will remove the thrombi pad for Sterile dressing placement.

## 2016-06-19 NOTE — Progress Notes (Addendum)
PROGRESS NOTE        PATIENT DETAILS Name: Melvin [name] Age: 82 y.o. Sex: male Date of Birth: 09-04-1927 Admit Date: 06/28/2016 Admitting Physician Jonah BlueJennifer Yates, MD ZOX:[id number]:[name], Chancy HurterJOHN CABOT, MD  Brief Narrative: Patient is a 82 y.o. male with history of severe as s/p TAVR October 2017, history of CAD s/p CABG in 1998 and PCI in 08/2014,hx of complete heart block requiring permanent pacemaker implantation in 05/2016, history of A. fib on anticoagulation with Eliquis presented to [facility] on 12/7 sepsis due to probable UTI. Patient was [facility] and started on broad-spectrum antibiotic, unfortunately hospital course was complicated by worsening acute hypoxemic respiratory failure due to bilateral pulmonary infiltrates, necessitating BiPAP support. 2-D echocardiogram showed worsening EF of around 25%. Patient was subsequently [facility] evaluation and treatment.   On 12/12, the patient still with hypoxia requiring BiPAP support, and developed wide complex tachycardia. See below for further details  Subjective: On nonrebreather mask this morning-was awake and alert. Thinks he is slightly better.   Assessment/Plan: Sepsis secondary to UTI and possible HCAP: Sepsis pathophysiology has resolved, however hospital course has been complicated by development of severe hypoxia due to bilateral pulmonary infiltrates-not sure if this is all HCAP, suspect some amount of residual pulmonary edema. Given severity of his hypoxia, worsening leukocytosis-he remains on empiric broad-spectrum antibiotics.  Acute hypoxemic respiratory failure: Required BiPAP for close to 2 days, he has now been transitioned to NRB. Etiology thought to be a combination of acute systolic heart failure and HCAP. Keep BiPAP at bedside-attempt to titrate down FiO2. On IV Lasix and Zaroxolyn, and broad-spectrum antibiotics. Long conversation  with patient and family yesterday-he is a DNI  Acute on chronic combined systolic and diastolic CHF: See above-echo on 12/11 shows EF around 20-25% which is a marked change from a very recent echocardiogram on 11/22 which showed an EF around 45%. Not sure if this is secondary to ischemia (recent cath on 9/29 showed patent grafts-but troponins elevated) or stress induced cardiomyopathy from sepsis. No significant aortic regurgitation seen on transthoracic echocardiogram to suggest valve malfunction. Although transferred from [facility] for possible right and left heart catheterization-given severity of hypoxia and inability to lie flat-this has been canceled for now. Continue Lasix and follow clinical course.  Wide complex tachycardia due to aberrant atrial fibrillation with RVR: Occurred on 12/12-initially thought to be ventricular tachycardia-bolused with amiodarone-subsequently EP emergently consulted-and underwent urgent cardioversion with initial restoration of sinus rhythm but recurrence of wide complex tachycardia. Upon further evaluation by EP-now thought to have aberrant atrial fibrillation with RVR. Cardiology following, patient remains on IV amiodarone, metoprolol and has been transitioned from IV heparin to Eliquis  AKI: Thought to be due to hemodynamic mediated injury in a setting of sepsis and dehydration along with contributions from lisinopril and Bactrim. Thankfully creatinine improved with just supportive care. Follow electrolytes.  Elevated troponins: Probably secondary to demand ischemia-given advanced age/frailty, profound hypoxia and worsening clinical status-not sure if pursuing heart catheterization is going to change outcome. Plans are to continue conservative management and follow clinical course.  Type 2 diabetes: Continue to hold metformin-CBGs stable with SSI. Follow.  History of severe aortic stenosis:underwent TAVR in October 2017. Transthoracic echo done this  admission did not show any significant aortic regurgitation to suggest of valve  malfunction. Given severity of hypoxia, not a candidate to pursue TEE in this setting. Family and patient both do not desire aggressive interventions at this time-if he does not improve in the next few days-family/patient are agreeable to transitioning to hospice care rather than pursuing further investigations including her TEE.  History of complete heart block/tachybradycardia syndrome: s/p implantation of permanent pacemaker on 05/16/16. Paced rhythm on telemetry.  History of CAD s/p CABG in 1998-s/p PCI to saphenous vein graft in 10/2014: Continue aspirin and statin. Has elevated troponin levels-trend is mostly flat-has also developed worsening EF seen on echo this admission-although plans were for cardiac catheterization-given severe hypoxemia and inability to lie flat-this has been canceled for now. Plans are to continue supportive care with conservative treatment and follow clinical course.  Anemia: Secondary to acute illness-follow. No indication for transfusion at this time.  Thrombocytopenia: Suspect secondary to sepsis, recent Bactrim therapy. Platelet count is now back to normal. Follow periodically.  Elevated liver enzymes: Suspect shock liver due to his cardiac output. Follow  Palliative care/goals of care: Long discussion with patient and daughter multiple times on 12/12-they're aware of critical situation of this patient with poor overall prognoses. Patient with multiple medical comorbidities and is very frail. DNI in place, plans are to continue current medical management and reassess in the next day or so-if no significant improvement, family agreeable to transition to hospice care. Family aware that patient is a very poor candidate to pursue aggressive care and further interventions. We will reassess over the next few days. Palliative care following.   DVT Prophylaxis: Full dose anticoagulation with  Eliquis  Code Status: DNI  Family Communication: None at bedside this morning-will recheck her to family again tomorrow  Disposition Plan: Remain inpatient-SNF or residential hospice on discharge-dependent on clinical course  Antimicrobial agents: See below  Procedures: Echo 12/11>>EF 20-25%  CONSULTS:  cardiology  Time spent: 35 minutes-Greater than 50% of this time was spent in counseling, explanation of diagnosis, planning of further management, and coordination of care.  MEDICATIONS: Anti-infectives    Start     Dose/Rate Route Frequency Ordered Stop   07/04/2016 1100  vancomycin (VANCOCIN) IVPB 750 mg/150 ml premix     750 mg 150 mL/hr over 60 Minutes Intravenous Every 12 hours 06/09/2016 1014     06/24/2016 1100  ceFEPIme (MAXIPIME) 2 g in dextrose 5 % 50 mL IVPB     2 g 100 mL/hr over 30 Minutes Intravenous Every 24 hours 06/20/2016 1014     06/15/16 1045  levofloxacin (LEVAQUIN) IVPB 500 mg  Status:  Discontinued     500 mg 100 mL/hr over 60 Minutes Intravenous Every 24 hours 06/15/16 1033 07/04/2016 0923   06/14/16 2000  vancomycin (VANCOCIN) 500 mg in sodium chloride 0.9 % 100 mL IVPB  Status:  Discontinued     500 mg 100 mL/hr over 60 Minutes Intravenous Every 24 hours 06/26/2016 1929 06/15/16 1033   06/14/16 0400  piperacillin-tazobactam (ZOSYN) IVPB 3.375 g  Status:  Discontinued     3.375 g 12.5 mL/hr over 240 Minutes Intravenous Every 8 hours 06/08/2016 1929 06/15/16 1033   07/06/2016 1845  piperacillin-tazobactam (ZOSYN) IVPB 3.375 g     3.375 g 100 mL/hr over 30 Minutes Intravenous  Once 07/05/2016 1842 06/07/2016 2016   06/12/2016 1845  vancomycin (VANCOCIN) IVPB 1000 mg/200 mL premix     1,000 mg 200 mL/hr over 60 Minutes Intravenous  Once 06/29/2016 1842 06/15/2016 2101      Scheduled Meds: .  apixaban  5 mg Oral BID  . aspirin  81 mg Oral QPM  . ceFEPime (MAXIPIME) IV  2 g Intravenous Q24H  . chlorhexidine  15 mL Mouth Rinse BID  . feeding supplement (ENSURE ENLIVE)   237 mL Oral BID BM  . furosemide  120 mg Intravenous BID  . gabapentin  300 mg Oral QHS  . insulin aspart  0-9 Units Subcutaneous Q4H  . mouth rinse  15 mL Mouth Rinse q12n4p  . metolazone  5 mg Oral Daily  . metoprolol  75 mg Oral BID  . senna-docusate  1 tablet Oral QHS  . sodium chloride flush  3 mL Intravenous Q12H  . vancomycin  750 mg Intravenous Q12H   Continuous Infusions: . amiodarone 30 mg/hr (06/19/16 0423)   PRN Meds:.acetaminophen, guaiFENesin-dextromethorphan, morphine injection, ondansetron **OR** ondansetron (ZOFRAN) IV, sodium chloride, sodium chloride flush, zolpidem   PHYSICAL EXAM: Vital signs: Vitals:   06/19/16 0900 06/19/16 1000 06/19/16 1137 06/19/16 1200  BP: (!) 103/58 (!) 83/69  (!) 119/59  Pulse: (!) 132 (!) 115  99  Resp: (!) 23 (!) 27  (!) 28  Temp:   98.4 F (36.9 C)   TempSrc:   Oral   SpO2: 100% 95%  95%  Weight:      Height:       Filed Weights   07/12/2016 0403 July 12, 2016 1400 06/19/16 0327  Weight: 77.1 kg (169 lb 14.4 oz) 74.5 kg (164 lb 3.9 oz) 75.3 kg (166 lb 0.1 oz)   Body mass index is 26.79 kg/m.   General appearance :Awake, alert, On NRB-appears comfortable.  Eyes:, pupils equally reactive to light and accomodation,no scleral icterus.Pink conjunctiva HEENT: Atraumatic and Normocephalic Neck: supple, +JVD. No cervical lymphadenopathy.  Resp: Bibasilar rales extending up to mid lung zones. CVS: S1 S2 regular, + systolic murmur.  GI: Bowel sounds present, Non tender and not distended with no gaurding, rigidity or rebound.No organomegaly Extremities: B/L Lower Ext shows no edema, both legs are warm to touch Neurology:  Moves all 4 tonsils. Psychiatric: Alert and oriented x 3.  Musculoskeletal:No digital cyanosis Skin:No Rash, warm and dry Wounds:N/A  I have personally reviewed following labs and imaging studies  LABORATORY DATA: CBC:  Recent Labs Lab 06/25/2016 1940 06/14/16 0235 06/15/16 0444 06/16/16 0620 12-Jul-2016 0830  06/19/16 0410  WBC 10.2 9.0 8.4 8.5 17.6* 15.0*  NEUTROABS 8.5*  --   --   --   --   --   HGB 10.5* 9.3* 9.3* 9.8* 9.7* 9.3*  HCT 31.6* 27.6* 28.4* 30.2* 30.7* 29.8*  MCV 94.3 93.9 95.6 96.8 95.0 97.1  PLT 103* 102* 104* 117* 185 209    Basic Metabolic Panel:  Recent Labs Lab 06/15/16 0444 06/16/16 0620 06/17/16 0509 06/17/16 0802 July 12, 2016 0830 06/19/16 0410  NA 140 141 136  --  143 142  K 4.7 4.2 4.6  --  3.7 3.6  CL 107 108 100*  --  102 102  CO2 27 26 26   --  33* 30  GLUCOSE 129* 159* 219*  --  135* 227*  BUN 33* 17 12  --  18 25*  CREATININE 1.36* 0.97 0.88  --  1.05 1.15  CALCIUM 8.6* 8.9 8.7*  --  8.9 8.4*  MG  --   --   --  1.7  --  2.3    GFR: Estimated Creatinine Clearance: 41.6 mL/min (by C-G formula based on SCr of 1.15 mg/dL).  Liver Function Tests:  Recent Labs Lab  06/18/2016 1940 06/14/16 0235 06/15/16 0444 06/16/16 0620 06/19/16 0410  AST 121* 104* 84* 61* 103*  ALT 74* 79* 69* 64* 110*  ALKPHOS 80 70 60 66 84  BILITOT 0.6 0.5 0.4 0.4 0.4  PROT 7.0 6.1* 5.7* 5.8* 5.6*  ALBUMIN 3.6 3.1* 2.7* 2.7* 2.2*   No results for input(s): LIPASE, AMYLASE in the last 168 hours. No results for input(s): AMMONIA in the last 168 hours.  Coagulation Profile:  Recent Labs Lab 06/12/2016 2022 06/18/2016 2324  INR 1.54 1.54    Cardiac Enzymes:  Recent Labs Lab 06/14/16 1117 06/17/16 0802 06/17/16 1337 06/17/16 1939 06-30-2016 2000  TROPONINI 0.22* 0.49* 0.56* 0.69* 0.64*    BNP (last 3 results) No results for input(s): PROBNP in the last 8760 hours.  HbA1C: No results for input(s): HGBA1C in the last 72 hours.  CBG:  Recent Labs Lab June 30, 2016 2003 06/30/2016 2358 06/19/16 0340 06/19/16 0802 06/19/16 1139  GLUCAP 185* 183* 166* 158* 164*    Lipid Profile: No results for input(s): CHOL, HDL, LDLCALC, TRIG, CHOLHDL, LDLDIRECT in the last 72 hours.  Thyroid Function Tests:  Recent Labs  06/19/16 0413  TSH 4.269    Anemia Panel: No  results for input(s): VITAMINB12, FOLATE, FERRITIN, TIBC, IRON, RETICCTPCT in the last 72 hours.  Urine analysis:    Component Value Date/Time   COLORURINE YELLOW 07/01/2016 1831   APPEARANCEUR CLEAR 06/12/2016 1831   LABSPEC 1.012 06/28/2016 1831   PHURINE 5.0 07/06/2016 1831   GLUCOSEU NEGATIVE 06/17/2016 1831   HGBUR NEGATIVE 07/01/2016 1831   BILIRUBINUR NEGATIVE 06/22/2016 1831   KETONESUR NEGATIVE 06/18/2016 1831   PROTEINUR 30 (A) 06/19/2016 1831   NITRITE NEGATIVE 07/07/2016 1831   LEUKOCYTESUR TRACE (A) 06/14/2016 1831    Sepsis Labs: Lactic Acid, Venous    Component Value Date/Time   LATICACIDVEN 1.0 06/14/2016 0235    MICROBIOLOGY: Recent Results (from the past 240 hour(s))  Urine culture     Status: Abnormal   Collection Time: 06/11/16 11:10 AM  Result Value Ref Range Status   Specimen Description URINE, CLEAN CATCH  Final   Special Requests NONE  Final   Culture >=100,000 COLONIES/mL KLEBSIELLA ORNITHINOLYTICA (A)  Final   Report Status 06/14/2016 FINAL  Final   Organism ID, Bacteria KLEBSIELLA ORNITHINOLYTICA (A)  Final      Susceptibility   Klebsiella ornithinolytica - MIC*    AMPICILLIN >=32 RESISTANT Resistant     CEFAZOLIN <=4 SENSITIVE Sensitive     CEFTRIAXONE <=1 SENSITIVE Sensitive     CIPROFLOXACIN <=0.25 SENSITIVE Sensitive     GENTAMICIN <=1 SENSITIVE Sensitive     IMIPENEM 0.5 SENSITIVE Sensitive     NITROFURANTOIN <=16 SENSITIVE Sensitive     TRIMETH/SULFA <=20 SENSITIVE Sensitive     AMPICILLIN/SULBACTAM 4 SENSITIVE Sensitive     PIP/TAZO <=4 SENSITIVE Sensitive     * >=100,000 COLONIES/mL KLEBSIELLA ORNITHINOLYTICA  Urine culture     Status: Abnormal   Collection Time: 06/15/2016  6:31 PM  Result Value Ref Range Status   Specimen Description URINE, RANDOM  Final   Special Requests NONE  Final   Culture (A)  Final    50,000 COLONIES/mL STAPHYLOCOCCUS SPECIES (COAGULASE NEGATIVE)   Report Status 06/16/2016 FINAL  Final   Organism ID,  Bacteria STAPHYLOCOCCUS SPECIES (COAGULASE NEGATIVE) (A)  Final      Susceptibility   Staphylococcus species (coagulase negative) - MIC*    CIPROFLOXACIN 1 SENSITIVE Sensitive     GENTAMICIN 2 SENSITIVE Sensitive  NITROFURANTOIN <=16 SENSITIVE Sensitive     OXACILLIN >=4 RESISTANT Resistant     TETRACYCLINE 2 SENSITIVE Sensitive     VANCOMYCIN 2 SENSITIVE Sensitive     TRIMETH/SULFA 160 RESISTANT Resistant     CLINDAMYCIN <=0.25 SENSITIVE Sensitive     RIFAMPIN <=0.5 SENSITIVE Sensitive     Inducible Clindamycin NEGATIVE Sensitive     * 50,000 COLONIES/mL STAPHYLOCOCCUS SPECIES (COAGULASE NEGATIVE)  Culture, blood (Routine x 2)     Status: Abnormal   Collection Time: 2016/07/02  7:40 PM  Result Value Ref Range Status   Specimen Description LEFT ANTECUBITAL  Final   Special Requests BOTTLES DRAWN AEROBIC AND ANAEROBIC 6CC  Final   Culture  Setup Time   Final    GRAM POSITIVE RODS IN BOTH AEROBIC AND ANAEROBIC BOTTLES Organism ID to follow CRITICAL RESULT CALLED TO, READ BACK BY AND VERIFIED WITH: Milus Mallick RN 1840 06/14/16 A BROWNING    Culture (A)  Final    DIPHTHEROIDS(CORYNEBACTERIUM SPECIES) Standardized susceptibility testing for this organism is not available. Performed at [facility]    Report Status 06/15/2016 FINAL  Final  Blood Culture ID Panel (Reflexed)     Status: None   Collection Time: 2016/07/02  7:40 PM  Result Value Ref Range Status   Enterococcus species NOT DETECTED NOT DETECTED Final   Listeria monocytogenes NOT DETECTED NOT DETECTED Final   Staphylococcus species NOT DETECTED NOT DETECTED Final   Staphylococcus aureus NOT DETECTED NOT DETECTED Final   Streptococcus species NOT DETECTED NOT DETECTED Final   Streptococcus agalactiae NOT DETECTED NOT DETECTED Final   Streptococcus pneumoniae NOT DETECTED NOT DETECTED Final   Streptococcus pyogenes NOT DETECTED NOT DETECTED Final   Acinetobacter baumannii NOT DETECTED NOT DETECTED Final    Enterobacteriaceae species NOT DETECTED NOT DETECTED Final   Enterobacter cloacae complex NOT DETECTED NOT DETECTED Final   Escherichia coli NOT DETECTED NOT DETECTED Final   Klebsiella oxytoca NOT DETECTED NOT DETECTED Final   Klebsiella pneumoniae NOT DETECTED NOT DETECTED Final   Proteus species NOT DETECTED NOT DETECTED Final   Serratia marcescens NOT DETECTED NOT DETECTED Final   Haemophilus influenzae NOT DETECTED NOT DETECTED Final   Neisseria meningitidis NOT DETECTED NOT DETECTED Final   Pseudomonas aeruginosa NOT DETECTED NOT DETECTED Final   Candida albicans NOT DETECTED NOT DETECTED Final   Candida glabrata NOT DETECTED NOT DETECTED Final   Candida krusei NOT DETECTED NOT DETECTED Final   Candida parapsilosis NOT DETECTED NOT DETECTED Final   Candida tropicalis NOT DETECTED NOT DETECTED Final    Comment: Performed at [facility]  Culture, blood (Routine x 2)     Status: None   Collection Time: 02-Jul-2016 10:50 PM  Result Value Ref Range Status   Specimen Description BLOOD LEFT FOREARM  Final   Special Requests   Final    BOTTLES DRAWN AEROBIC AND ANAEROBIC AEB 8CC ANA 6CC   Culture NO GROWTH 6 DAYS  Final   Report Status 06/19/2016 FINAL  Final  MRSA PCR Screening     Status: None   Collection Time: Jul 02, 2016 11:36 PM  Result Value Ref Range Status   MRSA by PCR NEGATIVE NEGATIVE Final    Comment:        The GeneXpert MRSA Assay (FDA approved for NASAL specimens only), is one component of a comprehensive MRSA colonization surveillance program. It is not intended to diagnose MRSA infection nor to guide or monitor treatment for MRSA infections.   MRSA PCR  Screening     Status: None   Collection Time: 06/17/16  1:23 PM  Result Value Ref Range Status   MRSA by PCR NEGATIVE NEGATIVE Final    Comment:        The GeneXpert MRSA Assay (FDA approved for NASAL specimens only), is one component of a comprehensive MRSA colonization surveillance program. It is  not intended to diagnose MRSA infection nor to guide or monitor treatment for MRSA infections.     RADIOLOGY STUDIES/RESULTS: [place] Abdomen Complete  Result Date: 06/15/2016 CLINICAL DATA:  Renal failure EXAM: ABDOMEN ULTRASOUND COMPLETE COMPARISON:  04/15/2016 FINDINGS: Gallbladder: Hypoechoic intraluminal sludge layering in the gallbladder. No definite gallstones. Wall thickness measures 2.9 mm. No Murphy's sign elicited or visualized pericholecystic fluid. Common bile duct: Diameter: 4 mm Liver: Background echogenicity is normal. Mild nodularity to the liver surface suggesting a component of cirrhosis. Patent hepatic and portal veins. Scattered echogenic lesions in the liver (2 visualized). Largest echogenic lesion has associated posterior acoustic shadowing compatible with calcification measuring 2.0 x 2.3 x 1.9 cm. This could represent complex calcified cyst, granulomata versus partially calcified hemangioma. IVC: No abnormality visualized. Pancreas: Visualized portion unremarkable. Spleen: Limited visualization because of bowel gas appear Right Kidney: Length: 10.3 cm. Slight increased echogenicity and cortical thinning. No hydronephrosis. Minimally complex hypoechoic cyst in the lower pole measures 2 cm. Left Kidney: Length: 10.0 cm. Normal echogenicity and preserved cortex. No hydronephrosis. Limited visualization because of bowel gas. Abdominal aorta: Obscured by bowel gas. Other findings: No free fluid. IMPRESSION: Gallbladder sludge without cholelithiasis or signs of cholecystitis. No biliary dilatation Nonspecific echogenic lesions in the liver, suspect hepatic granulomata versus partially calcified hemangiomas. These have a benign appearance. 2 cm right lower pole renal cyst Obscured aorta by bowel gas No free fluid or acute process by ultrasound Electronically Signed   By: Judie PetitM.  Shick M.D.   On: 06/15/2016 10:05   Dg Chest Port 1 View  Result Date: 06/19/2016 CLINICAL DATA:  CHF. EXAM:  PORTABLE CHEST 1 VIEW COMPARISON:  06/28/2016 . FINDINGS: AICD noted stable position. Prior CABG. Prior cardiac valve repair. Cardiomegaly. Diffuse bilateral pulmonary infiltrates/edema, minimal improvement from prior exam. Bibasilar atelectasis. Bilateral pleural effusions. No pneumothorax. IMPRESSION: 1.  AICD in stable position.  Prior CABG and cardiac valve repair. 2. Cardiomegaly with diffuse bilateral pulmonary infiltrates/edema, minimal improvement from prior exam. 3.  Bibasilar atelectasis. Electronically Signed   By: Maisie Fushomas  Register   On: 06/19/2016 06:50   Dg Chest Port 1 View  Result Date: 06/09/2016 CLINICAL DATA:  Right-sided central line placement. EXAM: PORTABLE CHEST 1 VIEW COMPARISON:  08/20/2015 at 8:51 a.m. FINDINGS: A new right-sided PICC line is present. The tip is thought to be at the cavoatrial junction but is somewhat obscured by 3 pacer leads directly overlying it. Prior CABG observed along with a aortic valve repair. Low lung volumes are present with moderate enlargement of the cardiopericardial silhouette and patchy airspace opacities in both lungs favoring the mid lungs and lung bases. Atherosclerotic calcification of the aortic arch noted. IMPRESSION: 1. Right-sided PICC line tip: Cavoatrial junction. No pneumothorax or complicating feature. 2. Enlargement of the cardiopericardial silhouette with suspected pulmonary edema. Low lung volumes. 3. Prior CABG.  Aortic valve repair. Electronically Signed   By: Gaylyn RongWalter  Liebkemann M.D.   On: 06/20/2016 19:50   Dg Chest Port 1 View  Result Date: 06/28/2016 CLINICAL DATA:  Urinary tract infection.  Sepsis and fever. EXAM: PORTABLE CHEST 1 VIEW COMPARISON:  Single-view of the chest  06/17/2016 and 06/15/2016. FINDINGS: Extensive bilateral airspace disease, worse on the right persists but appears mildly improved. No pneumothorax or pleural effusion. There is cardiomegaly. IMPRESSION: Persistent but improved right worse than left airspace  disease most compatible with pneumonia. Electronically Signed   By: Drusilla Kanner M.D.   On: 06/20/2016 09:04   Dg Chest Port 1 View  Result Date: 06/17/2016 CLINICAL DATA:  Shortness of breath. EXAM: PORTABLE CHEST 1 VIEW COMPARISON:  06/15/2016. FINDINGS: Cardiac pacer with lead tips over the right atrium right ventricle. Prior CABG. Cardiomegaly with bilateral pulmonary alveolar infiltrates consistent with congestive heart failure. Bilateral pneumonia cannot be excluded. Findings progressed from prior exam. Small bilateral pleural effusions. No pneumothorax. IMPRESSION: 1. Prior CABG. Cardiac pacer noted with lead tips in right atrium right ventricle. 2. Prominent bilateral pulmonary infiltrates consistent pulmonary edema. Findings progressed from prior exam Small bilateral pleural effusions . Electronically Signed   By: Maisie Fus  Register   On: 06/17/2016 07:37   Dg Chest Port 1 View  Result Date: 06/15/2016 CLINICAL DATA:  Hemoptysis. EXAM: PORTABLE CHEST 1 VIEW COMPARISON:  06/13/2016 and chest CT 04/15/2016 FINDINGS: Left-sided pacemaker and sternotomy wires unchanged. Patient is rotated to the left. There is moderate stable elevation of the left hemidiaphragm. Mild opacification adjacent the left hemidiaphragm which may be due to atelectasis. The cannot exclude a small amount left pleural fluid. There is hazy patchy opacification of the right mid to lower lung which may be due to asymmetric edema versus infection. Stable cardiomegaly. Remainder of the exam is unchanged. IMPRESSION: Mild opacification adjacent the elevated left hemidiaphragm suggesting atelectasis. Patchy hazy opacification over the right mid to lower lung which may be due to asymmetric edema versus infection. Possible small amount left pleural fluid. Stable cardiomegaly. Electronically Signed   By: Elberta Fortis M.D.   On: 06/15/2016 09:09   Dg Chest Port 1 View  Result Date: 06/30/2016 CLINICAL DATA:  Fever, UTI EXAM: PORTABLE  CHEST 1 VIEW COMPARISON:  05/17/2016 FINDINGS: Small left pleural effusion. Trace right pleural effusion. Bilateral interstitial thickening. No pneumothorax. Stable cardiomegaly. Prior CABG. Prior TAVR. Cardiac pacemaker noted. No acute osseous injury. IMPRESSION: 1. Mild CHF. Electronically Signed   By: Elige Ko   On: 06/11/2016 19:22     LOS: 6 days   Jeoffrey Massed, MD  [facility] Pager:[phone]  If 7PM-7AM, please contact night-coverage [email or web address] Password [email or web address] 06/19/2016, 2:46 PM

## 2016-06-20 ENCOUNTER — Inpatient Hospital Stay (HOSPITAL_COMMUNITY): Payer: Medicare Other

## 2016-06-20 DIAGNOSIS — I5021 Acute systolic (congestive) heart failure: Secondary | ICD-10-CM

## 2016-06-20 DIAGNOSIS — Z515 Encounter for palliative care: Secondary | ICD-10-CM

## 2016-06-20 LAB — COOXEMETRY PANEL
Carboxyhemoglobin: 1.1 % (ref 0.5–1.5)
METHEMOGLOBIN: 0.5 % (ref 0.0–1.5)
O2 Saturation: 66.4 %
TOTAL HEMOGLOBIN: 12.9 g/dL (ref 12.0–16.0)

## 2016-06-20 LAB — GLUCOSE, CAPILLARY
GLUCOSE-CAPILLARY: 138 mg/dL — AB (ref 65–99)
GLUCOSE-CAPILLARY: 298 mg/dL — AB (ref 65–99)
Glucose-Capillary: 137 mg/dL — ABNORMAL HIGH (ref 65–99)

## 2016-06-20 LAB — COMPREHENSIVE METABOLIC PANEL
ALK PHOS: 110 U/L (ref 38–126)
ALT: 146 U/L — AB (ref 17–63)
ANION GAP: 11 (ref 5–15)
AST: 168 U/L — ABNORMAL HIGH (ref 15–41)
Albumin: 2.1 g/dL — ABNORMAL LOW (ref 3.5–5.0)
BILIRUBIN TOTAL: 0.6 mg/dL (ref 0.3–1.2)
BUN: 35 mg/dL — ABNORMAL HIGH (ref 6–20)
CALCIUM: 8.3 mg/dL — AB (ref 8.9–10.3)
CO2: 33 mmol/L — ABNORMAL HIGH (ref 22–32)
CREATININE: 1.31 mg/dL — AB (ref 0.61–1.24)
Chloride: 96 mmol/L — ABNORMAL LOW (ref 101–111)
GFR calc non Af Amer: 48 mL/min — ABNORMAL LOW (ref >60)
GFR, EST AFRICAN AMERICAN: 55 mL/min — AB (ref >60)
Glucose, Bld: 158 mg/dL — ABNORMAL HIGH (ref 65–99)
Potassium: 3.7 mmol/L (ref 3.5–5.1)
SODIUM: 140 mmol/L (ref 135–145)
TOTAL PROTEIN: 5.5 g/dL — AB (ref 6.5–8.1)

## 2016-06-20 LAB — CBC
HCT: 28.1 % — ABNORMAL LOW (ref 39.0–52.0)
HEMOGLOBIN: 8.8 g/dL — AB (ref 13.0–17.0)
MCH: 30.6 pg (ref 26.0–34.0)
MCHC: 31.3 g/dL (ref 30.0–36.0)
MCV: 97.6 fL (ref 78.0–100.0)
Platelets: 221 10*3/uL (ref 150–400)
RBC: 2.88 MIL/uL — AB (ref 4.22–5.81)
RDW: 14.6 % (ref 11.5–15.5)
WBC: 14.2 10*3/uL — ABNORMAL HIGH (ref 4.0–10.5)

## 2016-06-20 MED ORDER — MORPHINE SULFATE (PF) 2 MG/ML IV SOLN
1.0000 mg | INTRAVENOUS | Status: DC | PRN
  Administered 2016-06-21: 2 mg via INTRAVENOUS
  Filled 2016-06-20: qty 1

## 2016-06-20 MED ORDER — GLYCOPYRROLATE 0.2 MG/ML IJ SOLN
0.2000 mg | INTRAMUSCULAR | Status: DC | PRN
  Filled 2016-06-20: qty 1

## 2016-06-20 MED ORDER — HALOPERIDOL 0.5 MG PO TABS
0.5000 mg | ORAL_TABLET | ORAL | Status: DC | PRN
  Filled 2016-06-20: qty 1

## 2016-06-20 MED ORDER — HALOPERIDOL LACTATE 5 MG/ML IJ SOLN: 0.5000 mg | INTRAMUSCULAR | Status: DC | PRN

## 2016-06-20 MED ORDER — BIOTENE DRY MOUTH MT LIQD: 15.0000 mL | OROMUCOSAL | Status: DC | PRN

## 2016-06-20 MED ORDER — POLYVINYL ALCOHOL 1.4 % OP SOLN
1.0000 [drp] | Freq: Four times a day (QID) | OPHTHALMIC | Status: DC | PRN
  Filled 2016-06-20: qty 15

## 2016-06-20 MED ORDER — BISACODYL 10 MG RE SUPP
10.0000 mg | Freq: Once | RECTAL | Status: AC
  Administered 2016-06-20: 10 mg via RECTAL
  Filled 2016-06-20: qty 1

## 2016-06-20 MED ORDER — HALOPERIDOL LACTATE 2 MG/ML PO CONC: 0.5000 mg | ORAL | Status: DC | PRN

## 2016-06-20 MED ORDER — SENNA 8.6 MG PO TABS
2.0000 | ORAL_TABLET | Freq: Two times a day (BID) | ORAL | Status: DC
  Administered 2016-06-20: 17.2 mg via ORAL
  Filled 2016-06-20 (×2): qty 2

## 2016-06-20 MED ORDER — METOPROLOL TARTRATE 50 MG PO TABS
50.0000 mg | ORAL_TABLET | Freq: Four times a day (QID) | ORAL | Status: DC
  Administered 2016-06-20 (×2): 50 mg via ORAL
  Filled 2016-06-20 (×2): qty 1

## 2016-06-20 NOTE — Progress Notes (Signed)
Nutrition Brief Note  Chart reviewed. Pt now transitioning to comfort care.  Recommend liberalizing diet for comfort and to continue to offer ensure enlive as desired by pt.  Please re-consult as needed.   Kendell BaneHeather Harvy Riera RD, LDN, CNSC [phone] Pager [phone] After Hours Pager

## 2016-06-20 NOTE — Progress Notes (Signed)
Daily Progress Note   Patient Name: Melvin [name]       Date: 06/20/2016 DOB: [birth year]  Age: 82 y.o. MRN#: [id number] Attending Physician: Jonetta Osgood, MD Primary Care Physician: Purvis Kilts, MD Admit Date: 06/19/2016  Reason for Consultation/Follow-up: Disposition, Establishing goals of care and Psychosocial/spiritual support  Subjective: Melvin [name] is more tired today with increased respiratory rate. No change on CXR from yesterday to today, however airspace density remains worse compared to five days ago. It is worse despite aggressive diuresis and antibiotics. Family had spoken with primary attending, Dr. Ivor Reining, prior to my meeting with them. He had recommended pursing comfort care given the lack of improvement despite aggressive interventions.   Length of Stay: 7  Current Medications: Scheduled Meds:  . apixaban  5 mg Oral BID  . aspirin  81 mg Oral QPM  . ceFEPime (MAXIPIME) IV  2 g Intravenous Q24H  . chlorhexidine  15 mL Mouth Rinse BID  . feeding supplement (ENSURE ENLIVE)  237 mL Oral BID BM  . furosemide  120 mg Intravenous BID  . gabapentin  300 mg Oral QHS  . insulin aspart  0-9 Units Subcutaneous Q4H  . mouth rinse  15 mL Mouth Rinse q12n4p  . metolazone  5 mg Oral Daily  . metoprolol  50 mg Oral QID  . senna-docusate  1 tablet Oral QHS  . sodium chloride flush  3 mL Intravenous Q12H    Continuous Infusions: . amiodarone 30 mg/hr (06/20/16 0504)    PRN Meds: acetaminophen, guaiFENesin-dextromethorphan, morphine injection, ondansetron **OR** ondansetron (ZOFRAN) IV, sodium chloride, sodium chloride flush, zolpidem  Physical Exam      Constitutional: He is oriented to person, place, and time.  Frail elderly man in bed with NRB mask  HENT:  Head: Normocephalic and atraumatic.  Eyes: EOM  are normal.  Neck: Normal range of motion.  Cardiovascular:  Heart rate under better control on monitor. He denies chest pain. Pulmonary/Chest:  NRB mask, increased RR compared to yesterday. Increased WOB compared to yesterday. Abdominal: Soft. Normal appearance.  Musculoskeletal: Normal range of motion.  Neurological: He is alert and oriented to person, place, and time.  Skin: Skin is warm and dry. There is pallor.  Psychiatric: He has a normal mood and affect. Judgment and thought content normal. Cognition and memory are normal.      Vital Signs: BP 106/84 (BP Location: Left Leg)   Pulse (!) 128   Temp 99 F (37.2 C) (Core (Comment))   Resp (!) 35   Ht '5\' 6"'$  (1.676 m)   Wt 74.1 kg (163 lb 4.8 oz)   SpO2 98%   BMI 26.36 kg/m  SpO2: SpO2: 98 % O2 Device: O2 Device: NRB O2 Flow Rate: O2 Flow Rate (L/min): 15 L/min  Intake/output summary:   Intake/Output Summary (Last 24 hours) at 06/20/16 1333 Last data filed at 06/20/16 1121  Gross per 24 hour  Intake          1277.25 ml  Output             2025 ml  Net          -747.75 ml   LBM: Last BM  Date: 06/16/16 Baseline Weight: Weight: 74.4 kg (164 lb) Most recent weight: Weight: 74.1 kg (163 lb 4.8 oz)  Flowsheet Rows   Flowsheet Row Most Recent Value  Intake Tab  Referral Department  Hospitalist  Unit at Time of Referral  Cardiac/Telemetry Unit  Palliative Care Primary Diagnosis  Cardiac  Date Notified  06/09/2016  Palliative Care Type  New Palliative care  Reason for referral  Clarify Goals of Care  Date of Admission  07/03/2016  # of days IP prior to Palliative referral  5  Clinical Assessment  Psychosocial & Spiritual Assessment  Palliative Care Outcomes  Patient/Family meeting held?  Yes  Who was at the meeting?  Met with wife and daughter outside of the room, and with pt in his room  Palliative Care Outcomes  Provided psychosocial or spiritual support, Clarified goals of care      Patient Active Problem List    Diagnosis Date Noted  . Acute systolic congestive heart failure (HCC)   . Atrial fibrillation with rapid ventricular response (HCC) 06/19/2016  . Congestive dilated cardiomyopathy (HCC) - with recent drop in EF  06/19/2016  . Acute on chronic combined systolic and diastolic CHF, NYHA class 3 (HCC) 06/19/2016  . Acute respiratory failure with hypoxia (HCC) 06/19/2016  . HAP (hospital-acquired pneumonia) 06/19/2016  . Demand ischemia of myocardium (HCC) 06/19/2016  . Fever   . Goals of care, counseling/discussion   . Palliative care encounter   . Sepsis (HCC) 06/12/2016  . Hyponatremia 06/19/2016  . Acute renal failure (HCC) 06/22/2016  . Elevated troponin 06/25/2016  . Anemia 06/24/2016  . Thrombocytopenia (HCC) 07/01/2016  . Status post biventricular pacemaker 05/20/2016  . Chronic combined systolic and diastolic CHF (congestive heart failure) (HCC) 05/20/2016  . Persistent atrial fibrillation (HCC) 05/16/2016  . Complete heart block (HCC) 05/11/2016  . S/P TAVR (transcatheter aortic valve replacement) 05/11/2016  . Skin lesion 02/11/2015  . Coronary artery disease involving native heart without angina pectoris 09/28/2014  . SOB (shortness of breath) 04/26/2014  . Tachycardia-bradycardia syndrome (HCC) 04/26/2014  . Non-insulin dependent type 2 diabetes mellitus (HCC) 07/02/2013  . Osteoporosis, unspecified 06/24/2013  . Edema, lower extremity 06/22/2013  . LBBB (left bundle branch block) 12/01/2012  . Heart palpitations 12/01/2012  . Unspecified hereditary and idiopathic peripheral neuropathy 10/01/2012  . RLS (restless legs syndrome) 10/01/2012  . Essential hypertension 11/26/2011  . HLD (hyperlipidemia) 11/26/2011  . Liver lesion 11/26/2011    Palliative Care Assessment & Plan   HPI: 82 y.o. male  with past medical history of CAD s/p CABG, aortic stenosis s/p TAVR, hypertension, hyperlipidemia, diabetes, persistent atrial fibrillation, and tachy brady syndrome s/p PPM  implant 05/2016. He was admitted on 06/23/2016 with fever, hemoptysis, and shortness of breath. Work-up revealed Urosepsis,  Acute on chronic systolic heart failure (EF 20-25%, prior echo in November with EF 45-50%), and Pneumonia with resultant acute respiratory failure requiring BiPAP. He has also developed A. Fib with RVR with unsuccessful cardioversion.   Assessment: Melvin [name] presently remains on NRB, however appears to be tiring. Cardiology recommended BiPAP for rest, however he did not tolerate it well when respiratory attempted to place it. CXR is unchanged compared to yesterday, and overall worse compared to five days ago. Despite aggressive diuresis, antibiotics, and time, his respiratory status has not improved. His appearance and increased respiratory rate is concerning and indicative that his body is tiring.   When I arrived at the patient's bedside his daughter asked that we talk in the  hall. She felt they were struggling with the new information, and asked that the teams talk together with them. When Dr. Sloan Leiter arrived we talked with the patient and his family together. They agreed with a comfort focus of care, and were interested in transitioning to residential hospice closer to home. Pt was clear that Hospice was intended for the end of life, and we would be stopping the current interventions that were life prolonging, and not comfort promoting.   Recommendations/Plan:  DNR/DNI. Comfort care only. SW working on residential hospice placement  Since he was symptomatic with the A. Fib with RVR, I would recommend leaving the Amiodarone infusion going until point of discharge. All cardiac monitoring has been discontinued, and we will leave him on the NRB and with his PICC in place (plan to discharge with NRB and PICC)  Medications adjusted for comfort  Code Status:  DNR/DNI  Prognosis:   Hours - Days  Discharge Planning:  Hospice facility  Care plan was discussed with pt,  pt's wife and daughter, care nurse, Social work, and primary attending  Thank you for allowing the Palliative Medicine Team to assist in the care of this patient.   Time In: 1330 Time Out: 1420 Total Time 60 minutes Prolonged Time Billed  no       Greater than 50%  of this time was spent counseling and coordinating care related to the above assessment and plan.  Charlynn Court, NP Palliative Medicine Team Team Phone # [phone]

## 2016-06-20 NOTE — Progress Notes (Signed)
Patient Name: Melvin [name] Date of Encounter: 06/20/2016  Primary Cardiologist: Dr. Rachelle HoraKelly  Hospital Problem List     Principal Problem:   Sepsis [facility](HCC) Active Problems:   Essential hypertension   HLD (hyperlipidemia)   LBBB (left bundle branch block)   Non-insulin dependent type 2 diabetes mellitus (HCC)   Coronary artery disease involving native heart without angina pectoris   S/P TAVR (transcatheter aortic valve replacement)   Persistent atrial fibrillation (HCC)   Chronic combined systolic and diastolic CHF (congestive heart failure) (HCC)   Hyponatremia   Acute renal failure (HCC)   Elevated troponin   Anemia   Thrombocytopenia (HCC)   Fever   Goals of care, counseling/discussion   Palliative care encounter   Atrial fibrillation with rapid ventricular response (HCC)   Congestive dilated cardiomyopathy (HCC) - with recent drop in EF    Acute on chronic combined systolic and diastolic CHF, NYHA class 3 (HCC)   Acute respiratory failure with hypoxia (HCC)   HAP (hospital-acquired pneumonia)   Demand ischemia of myocardium (HCC)     Subjective   Tired, worn out. Says he is not hurting anywhere. On NRB.   Inpatient Medications    Scheduled Meds: . apixaban  5 mg Oral BID  . aspirin  81 mg Oral QPM  . ceFEPime (MAXIPIME) IV  2 g Intravenous Q24H  . chlorhexidine  15 mL Mouth Rinse BID  . feeding supplement (ENSURE ENLIVE)  237 mL Oral BID BM  . furosemide  120 mg Intravenous BID  . gabapentin  300 mg Oral QHS  . insulin aspart  0-9 Units Subcutaneous Q4H  . mouth rinse  15 mL Mouth Rinse q12n4p  . metolazone  5 mg Oral Daily  . metoprolol  75 mg Oral BID  . senna-docusate  1 tablet Oral QHS  . sodium chloride flush  3 mL Intravenous Q12H  . vancomycin  750 mg Intravenous Q12H   Continuous Infusions: . amiodarone 30 mg/hr (06/20/16 0504)   PRN Meds: acetaminophen, guaiFENesin-dextromethorphan, morphine injection, ondansetron **OR** ondansetron  (ZOFRAN) IV, sodium chloride, sodium chloride flush, zolpidem   Vital Signs    Vitals:   06/20/16 0307 06/20/16 0400 06/20/16 0500 06/20/16 0600  BP: 119/73 101/75 (!) 77/66 105/71  Pulse: 92 88 99 (!) 108  Resp: (!) 32 (!) 23 (!) 28 (!) 28  Temp: 98.3 F (36.8 C)     TempSrc: Oral     SpO2: 98% 94% 97% 94%  Weight: 163 lb 4.8 oz (74.1 kg)     Height:        Intake/Output Summary (Last 24 hours) at 06/20/16 0645 Last data filed at 06/20/16 0600  Gross per 24 hour  Intake            956.8 ml  Output              325 ml  Net            631.8 ml   Filed Weights   07/05/2016 1400 06/19/16 0327 06/20/16 0307  Weight: 164 lb 3.9 oz (74.5 kg) 166 lb 0.1 oz (75.3 kg) 163 lb 4.8 oz (74.1 kg)    Physical Exam   GEN: Frail elderly man - in some distress HEENT: Grossly normal. NRB mask in place Neck: Supple, + JVD, carotid bruits, or masses. Cardiac: Irreg-Irreg, tachycardic with split s2 & SEM @ RUSB; ~1+ edema Respiratory:  Diffuse coarse rhonchi & rales GI: Soft, nontender, nondistended, BS + x 4.  Skin: warm and dry, no rash. Neuro:  Strength and sensation are intact. Psych: AAOx3.  Normal affect.  Labs    CBC  Recent Labs  06/19/16 0410 06/20/16 0400  WBC 15.0* 14.2*  HGB 9.3* 8.8*  HCT 29.8* 28.1*  MCV 97.1 97.6  PLT 209 221   Basic Metabolic Panel  Recent Labs  06/17/16 0802  06/19/16 0410 06/20/16 0400  NA  --   < > 142 140  K  --   < > 3.6 3.7  CL  --   < > 102 96*  CO2  --   < > 30 33*  GLUCOSE  --   < > 227* 158*  BUN  --   < > 25* 35*  CREATININE  --   < > 1.15 1.31*  CALCIUM  --   < > 8.4* 8.3*  MG 1.7  --  2.3  --   < > = values in this interval not displayed. Liver Function Tests  Recent Labs  06/19/16 0410 06/20/16 0400  AST 103* 168*  ALT 110* 146*  ALKPHOS 84 110  BILITOT 0.4 0.6  PROT 5.6* 5.5*  ALBUMIN 2.2* 2.1*   No results for input(s): LIPASE, AMYLASE in the last 72 hours. Cardiac Enzymes  Recent Labs  06/17/16 1337  06/17/16 1939 06/30/16 2000  TROPONINI 0.56* 0.69* 0.64*   Thyroid Function Tests  Recent Labs  06/19/16 0413  TSH 4.269    Telemetry    Aifb with abberancy- Personally Reviewed    Radiology    Dg Chest Port 1 View  Result Date: 06/19/2016 CLINICAL DATA:  CHF. EXAM: PORTABLE CHEST 1 VIEW COMPARISON:  30-Jun-2016 . FINDINGS: AICD noted stable position. Prior CABG. Prior cardiac valve repair. Cardiomegaly. Diffuse bilateral pulmonary infiltrates/edema, minimal improvement from prior exam. Bibasilar atelectasis. Bilateral pleural effusions. No pneumothorax. IMPRESSION: 1.  AICD in stable position.  Prior CABG and cardiac valve repair. 2. Cardiomegaly with diffuse bilateral pulmonary infiltrates/edema, minimal improvement from prior exam. 3.  Bibasilar atelectasis. Electronically Signed   By: Maisie Fus  Register   On: 06/19/2016 06:50   Dg Chest Port 1 View  Result Date: 06-30-16 CLINICAL DATA:  Right-sided central line placement. EXAM: PORTABLE CHEST 1 VIEW COMPARISON:  08/20/2015 at 8:51 a.m. FINDINGS: A new right-sided PICC line is present. The tip is thought to be at the cavoatrial junction but is somewhat obscured by 3 pacer leads directly overlying it. Prior CABG observed along with a aortic valve repair. Low lung volumes are present with moderate enlargement of the cardiopericardial silhouette and patchy airspace opacities in both lungs favoring the mid lungs and lung bases. Atherosclerotic calcification of the aortic arch noted. IMPRESSION: 1. Right-sided PICC line tip: Cavoatrial junction. No pneumothorax or complicating feature. 2. Enlargement of the cardiopericardial silhouette with suspected pulmonary edema. Low lung volumes. 3. Prior CABG.  Aortic valve repair. Electronically Signed   By: Gaylyn Rong M.D.   On: 06-30-2016 19:50   Dg Chest Port 1 View  Result Date: 06/30/2016 CLINICAL DATA:  Urinary tract infection.  Sepsis and fever. EXAM: PORTABLE CHEST 1 VIEW  COMPARISON:  Single-view of the chest 06/17/2016 and 06/15/2016. FINDINGS: Extensive bilateral airspace disease, worse on the right persists but appears mildly improved. No pneumothorax or pleural effusion. There is cardiomegaly. IMPRESSION: Persistent but improved right worse than left airspace disease most compatible with pneumonia. Electronically Signed   By: Drusilla Kanner M.D.   On: 06/30/16 09:04    Cardiac Studies  Echo: EF 20-25% - severe diffuse hypokinesis.; drop in EF from 40-45% in late November.   Patient Profile  Mr. Fant is a 82 year old male with a past medical history of CAD s/p CABG, ischemic cardiomyopathy, DM, and aortic stenosis s/p TAVR in Oct. 2017. Also developed CHB in Nov. 2017 and had [employer] BiV Pacemaker inserted. Admitted on 06/30/2016 from home (discharged from SNF the day prior), with fever, productive cough and rigors. Fever was 104 rectally on presentation, treated for urosepsis.   Echo done after TAVR shows EF of 45-50%, Echo done yesterday shows EF of 20-25%.  CXR with diffuse infiltrates - concern for HCAP & A on C Combined CHF. -- However - CoOx yesterday prior to Afib RVR was 70% that argues against the primary cause for respiratory failure being cardiac - more c/w PNA.  Is s/p BiVPPM (CRT-P) in November (post TAVR) for CHB --> however now in Afib RVR with aberrancy (wide-complex irregular tachycardia)     Assessment & Plan    Principal Problem:   Sepsis (HCC) / Fever- Abx per TRH - presumed UTI & now with likley HAP (hospital-acquired pneumonia)  On vanc/Cefipime  To avoid hypotension, will d/c Amlodipine & ACE-I (esp with RVR) Active Problems:   Coronary artery disease involving native heart without angina pectoris w / Elevated troponin/Demand ischemia of myocardium (HCC) Cath pre-TAVR with patent grafts, suspect + Troponin is related to Demand Ischemia in setting of sepsis, but cannot exclude new obstructive CAD given drop in EF (although  stress-induced CM is also quite likely)  Original plan on Txfr was R&LHC - cancelled due to respiratory distress & Afib RVR     S/P TAVR (transcatheter aortic valve replacement) - valve appears to be functioning well on Echo despite low EF.   Persistent atrial fibrillation (HCC) -- Atrial fibrillation with rapid ventricular response (HCC)  Unsuccessful DCCV x 2 on 06/16/2016 (appreciate EP assistance  - Dr. Elberta Fortis)  Rate is very responsive to BB  Continue Amiodarone IV - may re-attempt DCCV later today vs. Tomorrow ; I suspect that he will do better in NSR (but Afib likely being driven by PNA)    Congestive dilated cardiomyopathy (HCC) - with recent drop in EF  /Acute on chronic combined systolic and diastolic CHF, NYHA class 3 (HCC) - was ~7-10 lb above TAVR d/c wgt.  Continue BiPAP & aggressive diuresis - increased Lasix dose + metolazone yesterday with modest UOP.  -- ReCeheck CoOx today (BNP still quite high)  Concentrate IV meds - converted from IV Heparin to Eliquis (if no invasive procedures planned, we can convert back to Eliquis)  Not enough BP for ACE-I, but continue BB for rate control (will need to give PO vs. Convert to IV intermittent dosing)  ? Cause of drop in EF - ischemic vs. Stress induced vs. PPM related      Acute respiratory failure with hypoxia (HCC) - likely related to combined CHF & HAP: on BiPAP (is DNI)  Minimal improvement on CXR  On Abx per TRH  Aggressive diuresis  Hopefully with improved  Palliative Care Team consulted & extensive EOL discussions made  He is very weak - would probably put him back on BiPAP for Rest & QHS to rest him         Essential hypertension - hold amlodipine & ACE-I to allow for better rate control & avoid hypotension.   LBBB (left bundle branch block) - makes Afib appear to be VT - confirmed by  PPM interrogation to be Afib RVR - would like to decrease HR to allow for CRT-P to take effect.    Acute renal failure (HCC) -  resolved, but Cr up a bit.      Signed, Little IshikawaErin E Smith, NP  06/20/2016, 6:45 AM

## 2016-06-20 NOTE — Progress Notes (Signed)
Patient placed on BiPAP and stated the pressure was too high. RT turned settings down to 10/5. Not long after, patient again said settings were too forceful. Patient taken of BiPAP and placed back on NRB. Patient is in no distress at this time. RT will continue to monitor.

## 2016-06-20 NOTE — Progress Notes (Addendum)
PROGRESS NOTE        PATIENT DETAILS Name: Melvin [name] Age: 82 y.o. Sex: male Date of Birth: Nov 29, 1927 Admit Date: 06/25/2016 Admitting Physician Jonah Blue, MD [id number], Chancy Hurter, MD  Brief Narrative: Patient is a 82 y.o. male with history of severe as s/p TAVR October 2017, history of CAD s/p CABG in 1998 and PCI in 08/2014,hx of complete heart block requiring permanent pacemaker implantation in 05/2016, history of A. fib on anticoagulation with Eliquis presented to [facility] on 12/7 sepsis due to probable UTI. Patient was admitted to [facility] and started on broad-spectrum antibiotic, unfortunately hospital course was complicated by worsening acute hypoxemic respiratory failure due to bilateral pulmonary infiltrates, necessitating BiPAP support. 2-D echocardiogram showed worsening EF of around 25%. Patient was subsequently transferred to [facility] for further evaluation and treatment.   On 12/12, the patient still with hypoxia requiring BiPAP support, and developed wide complex tachycardia. See below for further details  Subjective: On nonrebreather mask this morning-was awake and alert. Looks very frail-tired-slightly more hypoxic  Assessment/Plan: Sepsis secondary to UTI and possible HCAP: Sepsis pathophysiology has resolved, however hospital course has been complicated by development of severe hypoxia due to bilateral pulmonary infiltrates-not sure if this is all HCAP, suspect some amount of pulmonary edema. Given severity of his hypoxia, worsening leukocytosis-he remains on empiric broad-spectrum antibiotics. Blood cultures on 12/12 negative so far, stop vancomycin and continue cefepime. See below for further details  Acute hypoxemic respiratory failure: Initially Required BiPAP, he has not been transitioned to NRB. Etiology thought to be a combination of acute systolic heart failure and HCAP. Given aggressive  diuretics with Lasix/Zaroxolyn and started on broad-spectrum antibiotics-unfortunately no significant improvement-remains very hypoxic on 100% nonrebreather mask. He is slightly more tachypnea today than the past few days. Spoke with patient, daughter and spouse at bedside-explained poor overall prognoses-and not much more to offer at this time. Have recommended that we transition to full comfort measures. Family in discussion, I will follow-up later today.  Acute on chronic combined systolic and diastolic CHF: See above-echo on 12/11 shows EF around 20-25% which is a marked change from a very recent echocardiogram on 11/22 which showed an EF around 45%. Not sure if this is secondary to ischemia (recent cath on 9/29 showed patent grafts-but troponins elevated) or stress induced cardiomyopathy from sepsis. No significant aortic regurgitation seen on transthoracic echocardiogram to suggest valve malfunction. Although transferred from [facility] for possible right and left heart catheterization-given severity of hypoxia and inability to lie flat-this has been canceled for now. Continue Lasix and follow clinical course.  Wide complex tachycardia due to aberrant atrial fibrillation with RVR: Occurred on 12/12-initially thought to be ventricular tachycardia-bolused with amiodarone-subsequently EP emergently consulted-and underwent urgent cardioversion with initial restoration of sinus rhythm but recurrence of wide complex tachycardia. Upon further evaluation by EP-now thought to have aberrant atrial fibrillation with RVR. Cardiology following, patient remains on IV amiodarone, metoprolol and has been transitioned from IV heparin to Eliquis.Unfortunately remains in A. fib with RVR.  AKI: Suspect this is hemodynamic mediated-probably from cardiorenal syndrome. Initially had AKI that had improved-thought to be 2/2 dehydration along with contributions from lisinopril and Bactrim. Follow  electrolytes.  Elevated troponins: Probably secondary to demand ischemia-given advanced age/frailty, profound hypoxia and worsening clinical status-not sure if pursuing heart catheterization  is going to change outcome. Plans are to continue conservative management and follow clinical course.  Type 2 diabetes: Continue to hold metformin-CBGs stable with SSI. Follow.  History of severe aortic stenosis:underwent TAVR in October 2017. Transthoracic echo done this admission did not show any significant aortic regurgitation to suggest of valve malfunction. Given severity of hypoxia, not a candidate to pursue TEE in this setting. Family and patient both do not desire aggressive interventions at this time-if he does not improve in the next few days-family/patient are agreeable to transitioning to hospice care rather than pursuing further investigations including her TEE.  History of complete heart block/tachybradycardia syndrome: s/p implantation of permanent pacemaker on 05/16/16. Paced rhythm on telemetry.  History of CAD s/p CABG in 1998-s/p PCI to saphenous vein graft in 10/2014: Continue aspirin and statin. Has elevated troponin levels-trend is mostly flat-has also developed worsening EF seen on echo this admission-although plans were for cardiac catheterization-given severe hypoxemia and inability to lie flat-this has been canceled for now. Plans are to continue supportive care with conservative treatment and follow clinical course.  Anemia: Secondary to acute illness-follow. No indication for transfusion at this time.  Thrombocytopenia: Suspect secondary to sepsis, recent Bactrim therapy. Platelet count is now back to normal. Follow periodically.  Elevated liver enzymes: Suspect shock liver due to his cardiac output. Follow  Palliative care/goals of care: unfortunately in spite of aggressive therapy with broad-spectrum antibiotics, and diuretics-no significant improvement in patient's overall clinical  condition-he remains hypoxic on 100% NRB-he is now starting to be become more tachypneic.He is a DNI-and we had discussion with family a few days back-and had agreed to try treatment for a few days. Family and patient did not desire aggressive medical care.  After a long discussion with patient and family, I have recommend we consider transitioning to comfort measures.Family in discussion,I will follow-up later this afternoon.Family aware of very poor prognoses   Addendum 2:15 PM Discussion again with spouse, daughter at patient's bedside. Palliative care at bedside as well. Family had a chance to discuss-patient is now agreeable to completely transition to comfort measures. We discussed about disposition to residential hospice-[facility] in [place] is very close to where patient's family lives and since patient is deteriorating but is somewhat stable enough to be transferred there-we will see if this can happen today. I suspect the patient's life expectancy is only a few days. At the request of the patient's spouse, I spoke with Dr. Sherren Mocha primary cardiologist over the phone. I have also informed consulting cardiologist-Dr. Herbie Baltimore of family's decision.   Total additional time spent approximately 45 minutes.   DVT Prophylaxis: Full dose anticoagulation with Eliquis  Code Status: DNI  Family Communication: Daughter/Spose  at bedside  Disposition Plan: Remain inpatient-SNF or residential hospice on discharge-dependent on clinical course  Antimicrobial agents: See below  Procedures: Echo 12/11>>EF 20-25%  CONSULTS:  cardiology  Time spent: 35 minutes-Greater than 50% of this time was spent in counseling, explanation of diagnosis, planning of further management, and coordination of care.   Addendum 2:15 PM See addendum note above-Total additional time spent approximately 45 minutes.    MEDICATIONS: Anti-infectives    Start     Dose/Rate Route  Frequency Ordered Stop   06/15/2016 1100  vancomycin (VANCOCIN) IVPB 750 mg/150 ml premix  Status:  Discontinued     750 mg 150 mL/hr over 60 Minutes Intravenous Every 12 hours 06/12/2016 1014 06/20/16 0915   06/20/2016 1100  ceFEPIme (MAXIPIME) 2 g in  dextrose 5 % 50 mL IVPB     2 g 100 mL/hr over 30 Minutes Intravenous Every 24 hours July 17, 2016 1014     06/15/16 1045  levofloxacin (LEVAQUIN) IVPB 500 mg  Status:  Discontinued     500 mg 100 mL/hr over 60 Minutes Intravenous Every 24 hours 06/15/16 1033 07/17/2016 0923   06/14/16 2000  vancomycin (VANCOCIN) 500 mg in sodium chloride 0.9 % 100 mL IVPB  Status:  Discontinued     500 mg 100 mL/hr over 60 Minutes Intravenous Every 24 hours 06/27/2016 1929 06/15/16 1033   06/14/16 0400  piperacillin-tazobactam (ZOSYN) IVPB 3.375 g  Status:  Discontinued     3.375 g 12.5 mL/hr over 240 Minutes Intravenous Every 8 hours 06/30/2016 1929 06/15/16 1033   07/06/2016 1845  piperacillin-tazobactam (ZOSYN) IVPB 3.375 g     3.375 g 100 mL/hr over 30 Minutes Intravenous  Once 06/20/2016 1842 06/27/2016 2016   06/11/2016 1845  vancomycin (VANCOCIN) IVPB 1000 mg/200 mL premix     1,000 mg 200 mL/hr over 60 Minutes Intravenous  Once 07/06/2016 1842 07/03/2016 2101      Scheduled Meds: . apixaban  5 mg Oral BID  . aspirin  81 mg Oral QPM  . ceFEPime (MAXIPIME) IV  2 g Intravenous Q24H  . chlorhexidine  15 mL Mouth Rinse BID  . feeding supplement (ENSURE ENLIVE)  237 mL Oral BID BM  . furosemide  120 mg Intravenous BID  . gabapentin  300 mg Oral QHS  . insulin aspart  0-9 Units Subcutaneous Q4H  . mouth rinse  15 mL Mouth Rinse q12n4p  . metolazone  5 mg Oral Daily  . metoprolol  50 mg Oral QID  . senna-docusate  1 tablet Oral QHS  . sodium chloride flush  3 mL Intravenous Q12H   Continuous Infusions: . amiodarone 30 mg/hr (06/20/16 0504)   PRN Meds:.acetaminophen, guaiFENesin-dextromethorphan, morphine injection, ondansetron **OR** ondansetron (ZOFRAN) IV, sodium  chloride, sodium chloride flush, zolpidem   PHYSICAL EXAM: Vital signs: Vitals:   06/20/16 1132 06/20/16 1137 06/20/16 1138 06/20/16 1200  BP:    106/84  Pulse: (!) 142 (!) 114 (!) 122 (!) 128  Resp: (!) 22 (!) 32 (!) 32 (!) 35  Temp:    99 F (37.2 C)  TempSrc:    Core (Comment)  SpO2: (!) 86% 97% 98% 98%  Weight:      Height:       Filed Weights   2016/07/17 1400 06/19/16 0327 06/20/16 0307  Weight: 74.5 kg (164 lb 3.9 oz) 75.3 kg (166 lb 0.1 oz) 74.1 kg (163 lb 4.8 oz)   Body mass index is 26.36 kg/m.   General appearance :Awake, alert, On NRB-appears more tachypniec than yesterday. Very frail appearing Eyes:, pupils equally reactive to light and accomodation,no scleral icterus.Pink conjunctiva HEENT: Atraumatic and Normocephalic Neck: supple, +JVD. No cervical lymphadenopathy.  Resp: Bibasilar rales extending up to mid lung zones. CVS: S1 S2 regular, + systolic murmur-with tachycardia GI: Bowel sounds present, Non tender and not distended with no gaurding, rigidity or rebound.No organomegaly Extremities: B/L Lower Ext shows no edema, both legs are warm to touch Neurology:  Moves all 4 tonsils. Psychiatric: Alert and oriented x 3.  Musculoskeletal:No digital cyanosis Skin:No Rash, warm and dry Wounds:N/A  I have personally reviewed following labs and imaging studies  LABORATORY DATA: CBC:  Recent Labs Lab 06/08/2016 1940  06/15/16 0444 06/16/16 0620 2016/07/17 0830 06/19/16 0410 06/20/16 0400  WBC 10.2  < >  8.4 8.5 17.6* 15.0* 14.2*  NEUTROABS 8.5*  --   --   --   --   --   --   HGB 10.5*  < > 9.3* 9.8* 9.7* 9.3* 8.8*  HCT 31.6*  < > 28.4* 30.2* 30.7* 29.8* 28.1*  MCV 94.3  < > 95.6 96.8 95.0 97.1 97.6  PLT 103*  < > 104* 117* 185 209 221  < > = values in this interval not displayed.  Basic Metabolic Panel:  Recent Labs Lab 06/16/16 0620 06/17/16 0509 06/17/16 0802 06/09/2016 0830 06/19/16 0410 06/20/16 0400  NA 141 136  --  143 142 140  K 4.2 4.6  --   3.7 3.6 3.7  CL 108 100*  --  102 102 96*  CO2 26 26  --  33* 30 33*  GLUCOSE 159* 219*  --  135* 227* 158*  BUN 17 12  --  18 25* 35*  CREATININE 0.97 0.88  --  1.05 1.15 1.31*  CALCIUM 8.9 8.7*  --  8.9 8.4* 8.3*  MG  --   --  1.7  --  2.3  --     GFR: Estimated Creatinine Clearance: 36.5 mL/min (by C-G formula based on SCr of 1.31 mg/dL (H)).  Liver Function Tests:  Recent Labs Lab 06/14/16 0235 06/15/16 0444 06/16/16 0620 06/19/16 0410 06/20/16 0400  AST 104* 84* 61* 103* 168*  ALT 79* 69* 64* 110* 146*  ALKPHOS 70 60 66 84 110  BILITOT 0.5 0.4 0.4 0.4 0.6  PROT 6.1* 5.7* 5.8* 5.6* 5.5*  ALBUMIN 3.1* 2.7* 2.7* 2.2* 2.1*   No results for input(s): LIPASE, AMYLASE in the last 168 hours. No results for input(s): AMMONIA in the last 168 hours.  Coagulation Profile:  Recent Labs Lab 07/01/2016 2022 07/07/2016 2324  INR 1.54 1.54    Cardiac Enzymes:  Recent Labs Lab 06/14/16 1117 06/17/16 0802 06/17/16 1337 06/17/16 1939 06/26/2016 2000  TROPONINI 0.22* 0.49* 0.56* 0.69* 0.64*    BNP (last 3 results) No results for input(s): PROBNP in the last 8760 hours.  HbA1C: No results for input(s): HGBA1C in the last 72 hours.  CBG:  Recent Labs Lab 06/19/16 1605 06/19/16 2018 06/20/16 0034 06/20/16 0311 06/20/16 0821  GLUCAP 139* 254* 298* 138* 137*    Lipid Profile: No results for input(s): CHOL, HDL, LDLCALC, TRIG, CHOLHDL, LDLDIRECT in the last 72 hours.  Thyroid Function Tests:  Recent Labs  06/19/16 0413  TSH 4.269    Anemia Panel: No results for input(s): VITAMINB12, FOLATE, FERRITIN, TIBC, IRON, RETICCTPCT in the last 72 hours.  Urine analysis:    Component Value Date/Time   COLORURINE YELLOW 06/18/2016 1831   APPEARANCEUR CLEAR 07/04/2016 1831   LABSPEC 1.012 07/04/2016 1831   PHURINE 5.0 06/18/2016 1831   GLUCOSEU NEGATIVE 06/22/2016 1831   HGBUR NEGATIVE 07/07/2016 1831   BILIRUBINUR NEGATIVE 07/06/2016 1831   KETONESUR NEGATIVE  06/22/2016 1831   PROTEINUR 30 (A) 06/20/2016 1831   NITRITE NEGATIVE 06/27/2016 1831   LEUKOCYTESUR TRACE (A) 06/17/2016 1831    Sepsis Labs: Lactic Acid, Venous    Component Value Date/Time   LATICACIDVEN 1.0 06/14/2016 0235    MICROBIOLOGY: Recent Results (from the past 240 hour(s))  Urine culture     Status: Abnormal   Collection Time: 06/11/16 11:10 AM  Result Value Ref Range Status   Specimen Description URINE, CLEAN CATCH  Final   Special Requests NONE  Final   Culture >=100,000 COLONIES/mL KLEBSIELLA ORNITHINOLYTICA (A)  Final   Report Status 06/14/2016 FINAL  Final   Organism ID, Bacteria KLEBSIELLA ORNITHINOLYTICA (A)  Final      Susceptibility   Klebsiella ornithinolytica - MIC*    AMPICILLIN >=32 RESISTANT Resistant     CEFAZOLIN <=4 SENSITIVE Sensitive     CEFTRIAXONE <=1 SENSITIVE Sensitive     CIPROFLOXACIN <=0.25 SENSITIVE Sensitive     GENTAMICIN <=1 SENSITIVE Sensitive     IMIPENEM 0.5 SENSITIVE Sensitive     NITROFURANTOIN <=16 SENSITIVE Sensitive     TRIMETH/SULFA <=20 SENSITIVE Sensitive     AMPICILLIN/SULBACTAM 4 SENSITIVE Sensitive     PIP/TAZO <=4 SENSITIVE Sensitive     * >=100,000 COLONIES/mL KLEBSIELLA ORNITHINOLYTICA  Urine culture     Status: Abnormal   Collection Time: 07/03/2016  6:31 PM  Result Value Ref Range Status   Specimen Description URINE, RANDOM  Final   Special Requests NONE  Final   Culture (A)  Final    50,000 COLONIES/mL STAPHYLOCOCCUS SPECIES (COAGULASE NEGATIVE)   Report Status 06/16/2016 FINAL  Final   Organism ID, Bacteria STAPHYLOCOCCUS SPECIES (COAGULASE NEGATIVE) (A)  Final      Susceptibility   Staphylococcus species (coagulase negative) - MIC*    CIPROFLOXACIN 1 SENSITIVE Sensitive     GENTAMICIN 2 SENSITIVE Sensitive     NITROFURANTOIN <=16 SENSITIVE Sensitive     OXACILLIN >=4 RESISTANT Resistant     TETRACYCLINE 2 SENSITIVE Sensitive     VANCOMYCIN 2 SENSITIVE Sensitive     TRIMETH/SULFA 160 RESISTANT Resistant      CLINDAMYCIN <=0.25 SENSITIVE Sensitive     RIFAMPIN <=0.5 SENSITIVE Sensitive     Inducible Clindamycin NEGATIVE Sensitive     * 50,000 COLONIES/mL STAPHYLOCOCCUS SPECIES (COAGULASE NEGATIVE)  Culture, blood (Routine x 2)     Status: Abnormal   Collection Time: 06/20/2016  7:40 PM  Result Value Ref Range Status   Specimen Description LEFT ANTECUBITAL  Final   Special Requests BOTTLES DRAWN AEROBIC AND ANAEROBIC 6CC  Final   Culture  Setup Time   Final    GRAM POSITIVE RODS IN BOTH AEROBIC AND ANAEROBIC BOTTLES Organism ID to follow CRITICAL RESULT CALLED TO, READ BACK BY AND VERIFIED WITH: Milus Mallick RN 1840 06/14/16 A BROWNING    Culture (A)  Final    DIPHTHEROIDS(CORYNEBACTERIUM SPECIES) Standardized susceptibility testing for this organism is not available. Performed at [facility]    Report Status 06/15/2016 FINAL  Final  Blood Culture ID Panel (Reflexed)     Status: None   Collection Time: 06/22/2016  7:40 PM  Result Value Ref Range Status   Enterococcus species NOT DETECTED NOT DETECTED Final   Listeria monocytogenes NOT DETECTED NOT DETECTED Final   Staphylococcus species NOT DETECTED NOT DETECTED Final   Staphylococcus aureus NOT DETECTED NOT DETECTED Final   Streptococcus species NOT DETECTED NOT DETECTED Final   Streptococcus agalactiae NOT DETECTED NOT DETECTED Final   Streptococcus pneumoniae NOT DETECTED NOT DETECTED Final   Streptococcus pyogenes NOT DETECTED NOT DETECTED Final   Acinetobacter baumannii NOT DETECTED NOT DETECTED Final   Enterobacteriaceae species NOT DETECTED NOT DETECTED Final   Enterobacter cloacae complex NOT DETECTED NOT DETECTED Final   Escherichia coli NOT DETECTED NOT DETECTED Final   Klebsiella oxytoca NOT DETECTED NOT DETECTED Final   Klebsiella pneumoniae NOT DETECTED NOT DETECTED Final   Proteus species NOT DETECTED NOT DETECTED Final   Serratia marcescens NOT DETECTED NOT DETECTED Final   Haemophilus influenzae NOT DETECTED NOT  DETECTED  Final   Neisseria meningitidis NOT DETECTED NOT DETECTED Final   Pseudomonas aeruginosa NOT DETECTED NOT DETECTED Final   Candida albicans NOT DETECTED NOT DETECTED Final   Candida glabrata NOT DETECTED NOT DETECTED Final   Candida krusei NOT DETECTED NOT DETECTED Final   Candida parapsilosis NOT DETECTED NOT DETECTED Final   Candida tropicalis NOT DETECTED NOT DETECTED Final    Comment: [facility]  Culture, blood (Routine x 2)     Status: None   Collection Time: 06/20/16 10:50 PM  Result Value Ref Range Status   Specimen Description BLOOD LEFT FOREARM  Final   Special Requests   Final    BOTTLES DRAWN AEROBIC AND ANAEROBIC AEB 8CC ANA 6CC   Culture NO GROWTH 6 DAYS  Final   Report Status 06/19/2016 FINAL  Final  MRSA PCR Screening     Status: None   Collection Time: 06/20/16 11:36 PM  Result Value Ref Range Status   MRSA by PCR NEGATIVE NEGATIVE Final    Comment:        The GeneXpert MRSA Assay (FDA approved for NASAL specimens only), is one component of a comprehensive MRSA colonization surveillance program. It is not intended to diagnose MRSA infection nor to guide or monitor treatment for MRSA infections.   MRSA PCR Screening     Status: None   Collection Time: 06/17/16  1:23 PM  Result Value Ref Range Status   MRSA by PCR NEGATIVE NEGATIVE Final    Comment:        The GeneXpert MRSA Assay (FDA approved for NASAL specimens only), is one component of a comprehensive MRSA colonization surveillance program. It is not intended to diagnose MRSA infection nor to guide or monitor treatment for MRSA infections.   Culture, blood (routine x 2)     Status: None (Preliminary result)   Collection Time: 06/19/2016 10:35 AM  Result Value Ref Range Status   Specimen Description BLOOD LEFT ANTECUBITAL  Final   Special Requests BOTTLES DRAWN AEROBIC ONLY 5CC  Final   Culture NO GROWTH 1 DAY  Final   Report Status PENDING  Incomplete  Culture, blood  (routine x 2)     Status: None (Preliminary result)   Collection Time: 06/25/2016 10:40 AM  Result Value Ref Range Status   Specimen Description BLOOD LEFT HAND  Final   Special Requests IN PEDIATRIC BOTTLE 0.5CC  Final   Culture NO GROWTH 1 DAY  Final   Report Status PENDING  Incomplete    RADIOLOGY STUDIES/RESULTS: [place] Abdomen Complete  Result Date: 06/15/2016 CLINICAL DATA:  Renal failure EXAM: ABDOMEN ULTRASOUND COMPLETE COMPARISON:  04/15/2016 FINDINGS: Gallbladder: Hypoechoic intraluminal sludge layering in the gallbladder. No definite gallstones. Wall thickness measures 2.9 mm. No Murphy's sign elicited or visualized pericholecystic fluid. Common bile duct: Diameter: 4 mm Liver: Background echogenicity is normal. Mild nodularity to the liver surface suggesting a component of cirrhosis. Patent hepatic and portal veins. Scattered echogenic lesions in the liver (2 visualized). Largest echogenic lesion has associated posterior acoustic shadowing compatible with calcification measuring 2.0 x 2.3 x 1.9 cm. This could represent complex calcified cyst, granulomata versus partially calcified hemangioma. IVC: No abnormality visualized. Pancreas: Visualized portion unremarkable. Spleen: Limited visualization because of bowel gas appear Right Kidney: Length: 10.3 cm. Slight increased echogenicity and cortical thinning. No hydronephrosis. Minimally complex hypoechoic cyst in the lower pole measures 2 cm. Left Kidney: Length: 10.0 cm. Normal echogenicity and preserved cortex. No hydronephrosis. Limited visualization because of bowel  gas. Abdominal aorta: Obscured by bowel gas. Other findings: No free fluid. IMPRESSION: Gallbladder sludge without cholelithiasis or signs of cholecystitis. No biliary dilatation Nonspecific echogenic lesions in the liver, suspect hepatic granulomata versus partially calcified hemangiomas. These have a benign appearance. 2 cm right lower pole renal cyst Obscured aorta by bowel gas No  free fluid or acute process by ultrasound Electronically Signed   By: Judie PetitM.  Shick M.D.   On: 06/15/2016 10:05   Dg Chest Port 1 View  Result Date: 06/20/2016 CLINICAL DATA:  Shortness of breath EXAM: PORTABLE CHEST 1 VIEW COMPARISON:  06/19/2016 FINDINGS: Previous median sternotomy and multiple cardiac procedures. Right arm PICC tip in the SVC above the right atrium. Cardiomegaly and aortic atherosclerosis again demonstrated. Widespread bilateral pulmonary density that could be a combination of pneumonia and edema appears the same. No change or new finding since yesterday. Compared older films, airspace density is worse than was seen 5 days ago. IMPRESSION: No change since yesterday. Worsening bilateral airspace density that could be a combination of pneumonia and edema. Electronically Signed   By: Paulina FusiMark  Shogry M.D.   On: 06/20/2016 07:07   Dg Chest Port 1 View  Result Date: 06/19/2016 CLINICAL DATA:  CHF. EXAM: PORTABLE CHEST 1 VIEW COMPARISON:  06/17/2016 . FINDINGS: AICD noted stable position. Prior CABG. Prior cardiac valve repair. Cardiomegaly. Diffuse bilateral pulmonary infiltrates/edema, minimal improvement from prior exam. Bibasilar atelectasis. Bilateral pleural effusions. No pneumothorax. IMPRESSION: 1.  AICD in stable position.  Prior CABG and cardiac valve repair. 2. Cardiomegaly with diffuse bilateral pulmonary infiltrates/edema, minimal improvement from prior exam. 3.  Bibasilar atelectasis. Electronically Signed   By: Maisie Fushomas  Register   On: 06/19/2016 06:50   Dg Chest Port 1 View  Result Date: 07/03/2016 CLINICAL DATA:  Right-sided central line placement. EXAM: PORTABLE CHEST 1 VIEW COMPARISON:  08/20/2015 at 8:51 a.m. FINDINGS: A new right-sided PICC line is present. The tip is thought to be at the cavoatrial junction but is somewhat obscured by 3 pacer leads directly overlying it. Prior CABG observed along with a aortic valve repair. Low lung volumes are present with moderate  enlargement of the cardiopericardial silhouette and patchy airspace opacities in both lungs favoring the mid lungs and lung bases. Atherosclerotic calcification of the aortic arch noted. IMPRESSION: 1. Right-sided PICC line tip: Cavoatrial junction. No pneumothorax or complicating feature. 2. Enlargement of the cardiopericardial silhouette with suspected pulmonary edema. Low lung volumes. 3. Prior CABG.  Aortic valve repair. Electronically Signed   By: Gaylyn RongWalter  Liebkemann M.D.   On: 06/26/2016 19:50   Dg Chest Port 1 View  Result Date: 06/20/2016 CLINICAL DATA:  Urinary tract infection.  Sepsis and fever. EXAM: PORTABLE CHEST 1 VIEW COMPARISON:  Single-view of the chest 06/17/2016 and 06/15/2016. FINDINGS: Extensive bilateral airspace disease, worse on the right persists but appears mildly improved. No pneumothorax or pleural effusion. There is cardiomegaly. IMPRESSION: Persistent but improved right worse than left airspace disease most compatible with pneumonia. Electronically Signed   By: Drusilla Kannerhomas  Dalessio M.D.   On: 06/22/2016 09:04   Dg Chest Port 1 View  Result Date: 06/17/2016 CLINICAL DATA:  Shortness of breath. EXAM: PORTABLE CHEST 1 VIEW COMPARISON:  06/15/2016. FINDINGS: Cardiac pacer with lead tips over the right atrium right ventricle. Prior CABG. Cardiomegaly with bilateral pulmonary alveolar infiltrates consistent with congestive heart failure. Bilateral pneumonia cannot be excluded. Findings progressed from prior exam. Small bilateral pleural effusions. No pneumothorax. IMPRESSION: 1. Prior CABG. Cardiac pacer noted with lead tips in  right atrium right ventricle. 2. Prominent bilateral pulmonary infiltrates consistent pulmonary edema. Findings progressed from prior exam Small bilateral pleural effusions . Electronically Signed   By: Maisie Fus  Register   On: 06/17/2016 07:37   Dg Chest Port 1 View  Result Date: 06/15/2016 CLINICAL DATA:  Hemoptysis. EXAM: PORTABLE CHEST 1 VIEW COMPARISON:   24-Jun-2016 and chest CT 04/15/2016 FINDINGS: Left-sided pacemaker and sternotomy wires unchanged. Patient is rotated to the left. There is moderate stable elevation of the left hemidiaphragm. Mild opacification adjacent the left hemidiaphragm which may be due to atelectasis. The cannot exclude a small amount left pleural fluid. There is hazy patchy opacification of the right mid to lower lung which may be due to asymmetric edema versus infection. Stable cardiomegaly. Remainder of the exam is unchanged. IMPRESSION: Mild opacification adjacent the elevated left hemidiaphragm suggesting atelectasis. Patchy hazy opacification over the right mid to lower lung which may be due to asymmetric edema versus infection. Possible small amount left pleural fluid. Stable cardiomegaly. Electronically Signed   By: Elberta Fortis M.D.   On: 06/15/2016 09:09   Dg Chest Port 1 View  Result Date: Jun 24, 2016 CLINICAL DATA:  Fever, UTI EXAM: PORTABLE CHEST 1 VIEW COMPARISON:  05/17/2016 FINDINGS: Small left pleural effusion. Trace right pleural effusion. Bilateral interstitial thickening. No pneumothorax. Stable cardiomegaly. Prior CABG. Prior TAVR. Cardiac pacemaker noted. No acute osseous injury. IMPRESSION: 1. Mild CHF. Electronically Signed   By: Elige Ko   On: 06-24-2016 19:22     LOS: 7 days   Jeoffrey Massed, MD  [facility] Pager:[phone]  If 7PM-7AM, please contact night-coverage [email or web address] Password [email or web address] 06/20/2016, 12:58 PM

## 2016-06-21 DIAGNOSIS — N179 Acute kidney failure, unspecified: Secondary | ICD-10-CM

## 2016-06-21 MED ORDER — SODIUM CHLORIDE 0.9 % IV SOLN
0.5000 mg/h | INTRAVENOUS | Status: DC
  Administered 2016-06-21: 0.5 mg/h via INTRAVENOUS
  Filled 2016-06-21: qty 5

## 2016-06-21 MED ORDER — HYDROMORPHONE BOLUS VIA INFUSION
0.2000 mg | INTRAVENOUS | Status: DC | PRN
  Filled 2016-06-21: qty 1

## 2016-06-21 MED ORDER — HYDROMORPHONE HCL 1 MG/ML IJ SOLN
0.5000 mg | INTRAMUSCULAR | Status: DC | PRN
  Administered 2016-06-21: 1 mg via INTRAVENOUS
  Filled 2016-06-21: qty 1

## 2016-06-21 MED ORDER — ACETAMINOPHEN 160 MG/5ML PO SOLN: 650.0000 mg | Freq: Four times a day (QID) | ORAL | Status: DC | PRN

## 2016-06-23 LAB — CULTURE, BLOOD (ROUTINE X 2)
Culture: NO GROWTH
Culture: NO GROWTH

## 2016-06-25 ENCOUNTER — Ambulatory Visit: Payer: Medicare Other | Admitting: Endocrinology

## 2016-07-03 ENCOUNTER — Ambulatory Visit: Payer: Medicare Other | Admitting: Physician Assistant

## 2016-07-08 NOTE — Progress Notes (Signed)
PROGRESS NOTE        PATIENT DETAILS Name: Melvin [name] Age: 83 y.o. Sex: male Date of Birth: 10-14-1927 Admit Date: 06/25/2016 Admitting Physician Jonah BlueJennifer Yates, MD ZOX:[id number]:[name], Chancy HurterJOHN CABOT, MD  Brief Narrative: Patient is a 83 y.o. male with history of severe as s/p TAVR October 2017, history of CAD s/p CABG in 1998 and PCI in 08/2014,hx of complete heart block requiring permanent pacemaker implantation in 05/2016, history of A. fib on anticoagulation with Eliquis presented to [facility] on 12/7 sepsis due to probable UTI. [facility] and started on broad-spectrum antibiotic, unfortunately hospital course was complicated by worsening acute hypoxemic respiratory failure due to bilateral pulmonary infiltrates, necessitating BiPAP support. 2-D echocardiogram showed worsening EF of around 25%. Patient was subsequently [facility] and treatment. Hospital course was further complicated by wide complex tachycardia requiring cardioversion-later thought to be aberrant A. fib with RVR.Unfortunately even with aggressive diuresis, broad-spectrum antibiotics-patient continued to have worsening hypoxia and did not improve. After long discussion with family, he was transitioned to comfort measures on 12/14. Plans are to transfer to residential hospice bed is available.  Subjective: On nonrebreather mask this morning-still very tachypneic-not completing full sentences this morning.  Assessment/Plan: Sepsis secondary to UTI and possible HCAP: Sepsis pathophysiology has resolved. Unfortunately hospital course  complicated by development of severe hypoxia due to bilateral pulmonary infiltrates-not sure if this is all HCAP, suspect some amount of pulmonary edema. Given severity of his hypoxia, worsening leukocytosis-he was started on empiric broad-spectrum antibiotics with vancomycin and cefepime. A  repeat blood cultures and 12/12 remain negative. Since he did not improve, after discussion with family, he has now been transitioned to full comfort measures-he no longer is on antimicrobial therapy.  Acute hypoxemic respiratory failure: Initially Required BiPAP, subsequently transitioned to 100 % NRB. Etiology thought to be a combination of acute systolic heart failure and HCAP. Even with aggressive diuretics and broad-spectrum antibiotic-no improvement, remained very hypoxic and started developing tachypnea. After prolonged discussion with family, transitioned to comfort measures ON 12/14  Acute on chronic combined systolic and diastolic CHF: Echo on 12/11 shows EF around 20-25% which is a marked change from a very recent echocardiogram on 11/22 which showed an EF around 45%. Not sure if this is secondary to ischemia (recent cath on 9/29 showed patent grafts-but troponins elevated) or stress induced cardiomyopathy from sepsis. No significant aortic regurgitation seen on transthoracic echocardiogram to suggest valve malfunction. Although transferred from [facility] for possible right and left heart catheterization-given severity of hypoxia and inability to lie flat-this has been canceled for now. He was managed with aggressive diuresis-but unfortunately continued to be in respiratory failure. After discussion with family, he was transitioned to comfort measures on 12/14.   Wide complex tachycardia due to aberrant atrial fibrillation with RVR: Occurred on 12/12-initially thought to be ventricular tachycardia-bolused with amiodarone-subsequently EP emergently consulted-and underwent urgent cardioversion with initial restoration of sinus rhythm but recurrence of wide complex tachycardia. Upon further evaluation by EP-now thought to have aberrant atrial fibrillation with RVR. Cardiology followed closely during this hospital stay, and remained on IV amiodarone-since he has been transitioned to comfort  measures-we have now discontinued amiodarone and Eliquis.  AKI: Suspect this is hemodynamic mediated-probably from cardiorenal syndrome. Initially had AKI that had improved-thought to  be 2/2 dehydration along with contributions from lisinopril and Bactrim. No longer following electrolytes as comfort measures.  Elevated troponins: Probably secondary to demand ischemia-given advanced age/frailty, profound hypoxia and worsening clinical status-not sure if pursuing heart catheterization is going to change outcome  Type 2 diabetes: Metformin was held on admission-CBGs were stable with SSI. Now on comfort measures.   History of severe aortic stenosis:underwent TAVR in October 2017. Transthoracic echo done this admission did not show any significant aortic regurgitation to suggest of valve malfunction. Given severity of hypoxia, not a candidate to pursue TEE in this setting. Family and patient both did not desire aggressive interventions-since he did not improve with antimicrobial therapy and aggressive diuresis-he has now been transitioned to full comfort measures.  History of complete heart block/tachybradycardia syndrome: s/p implantation of permanent pacemaker on 05/16/16. Paced rhythm on telemetry.  History of CAD s/p CABG in 1998-s/p PCI to saphenous vein graft in 10/2014: Continue aspirin and statin. Has elevated troponin levels-trend is mostly flat-has also developed worsening EF seen on echo this admission-although plans were for cardiac catheterization-given severe hypoxemia and inability to lie flat-this has been canceled for now. He has now been transitioned to full comfort measures-with no plans to pursue any further investigations.  Anemia: Secondary to acute illness-CBC was followed closely-but since transitioned to comfort measures-no plans to follow CBC at this time.  Thrombocytopenia: Suspect secondary to sepsis, recent Bactrim therapy. Platelet count is now back to normal.   Elevated  liver enzymes: Suspect shock liver due to his cardiac output. Since transitioned to comfort measures, no point in following her LFTs.  Palliative care/goals of care: Unfortunate 83 year old male with multiple medical problems-recent TAVR and permanent pacemaker placement-initially admitted to [facility] follow-up presumed sepsis secondary to UTI. Hospital course was complicated by development of bilateral pulmonary infiltrates-thought to be a combination of pneumonia and CHF. He developed hypoxic respiratory failure requiring BiPAP support. Even with broad-spectrum antibiotics, and aggressive IV diuretic therapy-patient did not improve. After discussion with family, patient has not been transitioned to full comfort measures. Palliative care is following. He continues to be very hypoxic, and tachypnea-he has now been started on a IV Dilaudid infusion. Although plans were to discharge to residential hospice-unfortunately no beds available-patient continues to deteriorate and I suspect he soon may not be in any shape to undertake the travel to residential hospice.  DVT Prophylaxis: None-comfort measures  Code Status: DNR  Family Communication: None  at bedside-in spite of patient being seen twice today. Palliative care has spoken with family today.  Disposition Plan: Remain inpatient-?residential hospice on discharge-dependent on condition-but may expire in the hospital  Antimicrobial agents: See below  Procedures: Echo 12/11>>EF 20-25%  CONSULTS:  cardiology  Time spent: 25 minutes-Greater than 50% of this time was spent in counseling, explanation of diagnosis, planning of further management, and coordination of care.  MEDICATIONS: Anti-infectives    Start     Dose/Rate Route Frequency Ordered Stop   Jun 20, 2016 1100  vancomycin (VANCOCIN) IVPB 750 mg/150 ml premix  Status:  Discontinued     750 mg 150 mL/hr over 60 Minutes Intravenous Every 12 hours 20-Jun-2016 1014 06/20/16 0915     2016-06-20 1100  ceFEPIme (MAXIPIME) 2 g in dextrose 5 % 50 mL IVPB  Status:  Discontinued     2 g 100 mL/hr over 30 Minutes Intravenous Every 24 hours 2016/06/20 1014 06/20/16 1438   06/15/16 1045  levofloxacin (LEVAQUIN) IVPB 500 mg  Status:  Discontinued  500 mg 100 mL/hr over 60 Minutes Intravenous Every 24 hours 06/15/16 1033 07-02-2016 0923   06/14/16 2000  vancomycin (VANCOCIN) 500 mg in sodium chloride 0.9 % 100 mL IVPB  Status:  Discontinued     500 mg 100 mL/hr over 60 Minutes Intravenous Every 24 hours 06/12/2016 1929 06/15/16 1033   06/14/16 0400  piperacillin-tazobactam (ZOSYN) IVPB 3.375 g  Status:  Discontinued     3.375 g 12.5 mL/hr over 240 Minutes Intravenous Every 8 hours 06/25/2016 1929 06/15/16 1033   06/20/2016 1845  piperacillin-tazobactam (ZOSYN) IVPB 3.375 g     3.375 g 100 mL/hr over 30 Minutes Intravenous  Once 06/19/2016 1842 06/29/2016 2016   07/04/2016 1845  vancomycin (VANCOCIN) IVPB 1000 mg/200 mL premix     1,000 mg 200 mL/hr over 60 Minutes Intravenous  Once 06/14/2016 1842 06/28/2016 2101      Scheduled Meds: . gabapentin  300 mg Oral QHS  . mouth rinse  15 mL Mouth Rinse q12n4p  . senna  2 tablet Oral BID  . sodium chloride flush  3 mL Intravenous Q12H   Continuous Infusions: . HYDROmorphone 0.5 mg/hr (06/13/2016 1451)   PRN Meds:.acetaminophen (TYLENOL) oral liquid 160 mg/5 mL, antiseptic oral rinse, glycopyrrolate, guaiFENesin-dextromethorphan, [DISCONTINUED] haloperidol **OR** haloperidol lactate, HYDROmorphone, [DISCONTINUED] ondansetron **OR** ondansetron (ZOFRAN) IV, polyvinyl alcohol, sodium chloride, sodium chloride flush   PHYSICAL EXAM: Vital signs: Vitals:   06/25/2016 1153 06/07/2016 1200 06/18/2016 1300 07/05/2016 1400  BP: 115/63 122/76    Pulse: (!) 129 (!) 110 (!) 132 (!) 136  Resp: (!) 31 (!) 26 (!) 21 (!) 22  Temp: 98.1 F (36.7 C)     TempSrc: Axillary     SpO2: 92% 92% (!) 85% (!) 89%  Weight:      Height:       Filed Weights   07-02-2016  1400 06/19/16 0327 06/20/16 0307  Weight: 74.5 kg (164 lb 3.9 oz) 75.3 kg (166 lb 0.1 oz) 74.1 kg (163 lb 4.8 oz)   Body mass index is 26.36 kg/m.   General appearance :Awake, alert, On NRB-appears more tachypniec than yesterday-unable to complete sentences. Very frail appearing Eyes:, pupils equally reactive to light and accomodation,no scleral icterus.Pink conjunctiva HEENT: Atraumatic and Normocephalic Neck: supple, +JVD. No cervical lymphadenopathy.  Resp: Bibasilar rales extending up to mid lung zones. CVS: S1 S2 regular, + systolic murmur-with tachycardia GI: Bowel sounds present, Non tender and not distended with no gaurding, rigidity or rebound.No organomegaly Extremities: B/L Lower Ext shows no edema, both legs are warm to touch Neurology:  Moves all 4 tonsils. Psychiatric: Alert and oriented Musculoskeletal:No digital cyanosis Skin:No Rash, warm and dry Wounds:N/A  I have personally reviewed following labs and imaging studies  LABORATORY DATA: CBC:  Recent Labs Lab 06/15/16 0444 06/16/16 0620 07/02/2016 0830 06/19/16 0410 06/20/16 0400  WBC 8.4 8.5 17.6* 15.0* 14.2*  HGB 9.3* 9.8* 9.7* 9.3* 8.8*  HCT 28.4* 30.2* 30.7* 29.8* 28.1*  MCV 95.6 96.8 95.0 97.1 97.6  PLT 104* 117* 185 209 221    Basic Metabolic Panel:  Recent Labs Lab 06/16/16 0620 06/17/16 0509 06/17/16 0802 2016-07-02 0830 06/19/16 0410 06/20/16 0400  NA 141 136  --  143 142 140  K 4.2 4.6  --  3.7 3.6 3.7  CL 108 100*  --  102 102 96*  CO2 26 26  --  33* 30 33*  GLUCOSE 159* 219*  --  135* 227* 158*  BUN 17 12  --  18 25*  35*  CREATININE 0.97 0.88  --  1.05 1.15 1.31*  CALCIUM 8.9 8.7*  --  8.9 8.4* 8.3*  MG  --   --  1.7  --  2.3  --     GFR: Estimated Creatinine Clearance: 36.5 mL/min (by C-G formula based on SCr of 1.31 mg/dL (H)).  Liver Function Tests:  Recent Labs Lab 06/15/16 0444 06/16/16 0620 06/19/16 0410 06/20/16 0400  AST 84* 61* 103* 168*  ALT 69* 64* 110* 146*    ALKPHOS 60 66 84 110  BILITOT 0.4 0.4 0.4 0.6  PROT 5.7* 5.8* 5.6* 5.5*  ALBUMIN 2.7* 2.7* 2.2* 2.1*   No results for input(s): LIPASE, AMYLASE in the last 168 hours. No results for input(s): AMMONIA in the last 168 hours.  Coagulation Profile: No results for input(s): INR, PROTIME in the last 168 hours.  Cardiac Enzymes:  Recent Labs Lab 06/17/16 0802 06/17/16 1337 06/17/16 1939 2016/06/28 2000  TROPONINI 0.49* 0.56* 0.69* 0.64*    BNP (last 3 results) No results for input(s): PROBNP in the last 8760 hours.  HbA1C: No results for input(s): HGBA1C in the last 72 hours.  CBG:  Recent Labs Lab 06/19/16 1605 06/19/16 2018 06/20/16 0034 06/20/16 0311 06/20/16 0821  GLUCAP 139* 254* 298* 138* 137*    Lipid Profile: No results for input(s): CHOL, HDL, LDLCALC, TRIG, CHOLHDL, LDLDIRECT in the last 72 hours.  Thyroid Function Tests:  Recent Labs  06/19/16 0413  TSH 4.269    Anemia Panel: No results for input(s): VITAMINB12, FOLATE, FERRITIN, TIBC, IRON, RETICCTPCT in the last 72 hours.  Urine analysis:    Component Value Date/Time   COLORURINE YELLOW 07/02/2016 1831   APPEARANCEUR CLEAR 06/09/2016 1831   LABSPEC 1.012 06/10/2016 1831   PHURINE 5.0 06/23/2016 1831   GLUCOSEU NEGATIVE 07/03/2016 1831   HGBUR NEGATIVE 07/01/2016 1831   BILIRUBINUR NEGATIVE 06/18/2016 1831   KETONESUR NEGATIVE 06/24/2016 1831   PROTEINUR 30 (A) 07/01/2016 1831   NITRITE NEGATIVE 06/07/2016 1831   LEUKOCYTESUR TRACE (A) 07/05/2016 1831    Sepsis Labs: Lactic Acid, Venous    Component Value Date/Time   LATICACIDVEN 1.0 06/14/2016 0235    MICROBIOLOGY: Recent Results (from the past 240 hour(s))  Urine culture     Status: Abnormal   Collection Time: 06/12/2016  6:31 PM  Result Value Ref Range Status   Specimen Description URINE, RANDOM  Final   Special Requests NONE  Final   Culture (A)  Final    50,000 COLONIES/mL STAPHYLOCOCCUS SPECIES (COAGULASE NEGATIVE)    Report Status 06/16/2016 FINAL  Final   Organism ID, Bacteria STAPHYLOCOCCUS SPECIES (COAGULASE NEGATIVE) (A)  Final      Susceptibility   Staphylococcus species (coagulase negative) - MIC*    CIPROFLOXACIN 1 SENSITIVE Sensitive     GENTAMICIN 2 SENSITIVE Sensitive     NITROFURANTOIN <=16 SENSITIVE Sensitive     OXACILLIN >=4 RESISTANT Resistant     TETRACYCLINE 2 SENSITIVE Sensitive     VANCOMYCIN 2 SENSITIVE Sensitive     TRIMETH/SULFA 160 RESISTANT Resistant     CLINDAMYCIN <=0.25 SENSITIVE Sensitive     RIFAMPIN <=0.5 SENSITIVE Sensitive     Inducible Clindamycin NEGATIVE Sensitive     * 50,000 COLONIES/mL STAPHYLOCOCCUS SPECIES (COAGULASE NEGATIVE)  Culture, blood (Routine x 2)     Status: Abnormal   Collection Time: 07/01/2016  7:40 PM  Result Value Ref Range Status   Specimen Description LEFT ANTECUBITAL  Final   Special Requests BOTTLES DRAWN AEROBIC  AND ANAEROBIC 6CC  Final   Culture  Setup Time   Final    GRAM POSITIVE RODS IN BOTH AEROBIC AND ANAEROBIC BOTTLES Organism ID to follow CRITICAL RESULT CALLED TO, READ BACK BY AND VERIFIED WITH: Milus Mallick RN 1840 06/14/16 A BROWNING    Culture (A)  Final    DIPHTHEROIDS(CORYNEBACTERIUM SPECIES) Standardized susceptibility testing for this organism is not available. Performed at [facility]    Report Status 06/15/2016 FINAL  Final  Blood Culture ID Panel (Reflexed)     Status: None   Collection Time: 06/16/2016  7:40 PM  Result Value Ref Range Status   Enterococcus species NOT DETECTED NOT DETECTED Final   Listeria monocytogenes NOT DETECTED NOT DETECTED Final   Staphylococcus species NOT DETECTED NOT DETECTED Final   Staphylococcus aureus NOT DETECTED NOT DETECTED Final   Streptococcus species NOT DETECTED NOT DETECTED Final   Streptococcus agalactiae NOT DETECTED NOT DETECTED Final   Streptococcus pneumoniae NOT DETECTED NOT DETECTED Final   Streptococcus pyogenes NOT DETECTED NOT DETECTED Final   Acinetobacter  baumannii NOT DETECTED NOT DETECTED Final   Enterobacteriaceae species NOT DETECTED NOT DETECTED Final   Enterobacter cloacae complex NOT DETECTED NOT DETECTED Final   Escherichia coli NOT DETECTED NOT DETECTED Final   Klebsiella oxytoca NOT DETECTED NOT DETECTED Final   Klebsiella pneumoniae NOT DETECTED NOT DETECTED Final   Proteus species NOT DETECTED NOT DETECTED Final   Serratia marcescens NOT DETECTED NOT DETECTED Final   Haemophilus influenzae NOT DETECTED NOT DETECTED Final   Neisseria meningitidis NOT DETECTED NOT DETECTED Final   Pseudomonas aeruginosa NOT DETECTED NOT DETECTED Final   Candida albicans NOT DETECTED NOT DETECTED Final   Candida glabrata NOT DETECTED NOT DETECTED Final   Candida krusei NOT DETECTED NOT DETECTED Final   Candida parapsilosis NOT DETECTED NOT DETECTED Final   Candida tropicalis NOT DETECTED NOT DETECTED Final    Comment: Performed at [facility]  Culture, blood (Routine x 2)     Status: None   Collection Time: 07/07/2016 10:50 PM  Result Value Ref Range Status   Specimen Description BLOOD LEFT FOREARM  Final   Special Requests   Final    BOTTLES DRAWN AEROBIC AND ANAEROBIC AEB 8CC ANA 6CC   Culture NO GROWTH 6 DAYS  Final   Report Status 06/19/2016 FINAL  Final  MRSA PCR Screening     Status: None   Collection Time: 06/26/2016 11:36 PM  Result Value Ref Range Status   MRSA by PCR NEGATIVE NEGATIVE Final    Comment:        The GeneXpert MRSA Assay (FDA approved for NASAL specimens only), is one component of a comprehensive MRSA colonization surveillance program. It is not intended to diagnose MRSA infection nor to guide or monitor treatment for MRSA infections.   MRSA PCR Screening     Status: None   Collection Time: 06/17/16  1:23 PM  Result Value Ref Range Status   MRSA by PCR NEGATIVE NEGATIVE Final    Comment:        The GeneXpert MRSA Assay (FDA approved for NASAL specimens only), is one component of a comprehensive MRSA  colonization surveillance program. It is not intended to diagnose MRSA infection nor to guide or monitor treatment for MRSA infections.   Culture, blood (routine x 2)     Status: None (Preliminary result)   Collection Time: 07/01/2016 10:35 AM  Result Value Ref Range Status   Specimen Description BLOOD LEFT  ANTECUBITAL  Final   Special Requests BOTTLES DRAWN AEROBIC ONLY 5CC  Final   Culture NO GROWTH 2 DAYS  Final   Report Status PENDING  Incomplete  Culture, blood (routine x 2)     Status: None (Preliminary result)   Collection Time: 07-13-16 10:40 AM  Result Value Ref Range Status   Specimen Description BLOOD LEFT HAND  Final   Special Requests IN PEDIATRIC BOTTLE 0.5CC  Final   Culture NO GROWTH 2 DAYS  Final   Report Status PENDING  Incomplete    RADIOLOGY STUDIES/RESULTS: US Abdomen Complete  Result Date: 06/15/2016 CLINICAL DATA:  Renal failure EXAM: ABDOMEN ULTRASOUND COMPLETE COMPARISON:  04/15/2016 FINDINGS: Gallbladder: Hypoechoic intraluminal sludge layering in the gallbladder. No definite gallstones. Wall thickness measures 2.9 mm. No Murphy's sign elicited or visualized pericholecystic fluid. Common bile duct: Diameter: 4 mm Liver: Background echogenicity is normal. Mild nodularity to the liver surface suggesting a component of cirrhosis. Patent hepatic and portal veins. Scattered echogenic lesions in the liver (2 visualized). Largest echogenic lesion has associated posterior acoustic shadowing compatible with calcification measuring 2.0 x 2.3 x 1.9 cm. This could represent complex calcified cyst, granulomata versus partially calcified hemangioma. IVC: No abnormality visualized. Pancreas: Visualized portion unremarkable. Spleen: Limited visualization because of bowel gas appear Right Kidney: Length: 10.3 cm. Slight increased echogenicity and cortical thinning. No hydronephrosis. Minimally complex hypoechoic cyst in the lower pole measures 2 cm. Left Kidney: Length: 10.0 cm.  Normal echogenicity and preserved cortex. No hydronephrosis. Limited visualization because of bowel gas. Abdominal aorta: Obscured by bowel gas. Other findings: No free fluid. IMPRESSION: Gallbladder sludge without cholelithiasis or signs of cholecystitis. No biliary dilatation Nonspecific echogenic lesions in the liver, suspect hepatic granulomata versus partially calcified hemangiomas. These have a benign appearance. 2 cm right lower pole renal cyst Obscured aorta by bowel gas No free fluid or acute process by ultrasound Electronically Signed   By: Judie Petit.  Shick M.D.   On: 06/15/2016 10:05   Dg Chest Port 1 View  Result Date: 06/20/2016 CLINICAL DATA:  Shortness of breath EXAM: PORTABLE CHEST 1 VIEW COMPARISON:  06/19/2016 FINDINGS: Previous median sternotomy and multiple cardiac procedures. Right arm PICC tip in the SVC above the right atrium. Cardiomegaly and aortic atherosclerosis again demonstrated. Widespread bilateral pulmonary density that could be a combination of pneumonia and edema appears the same. No change or new finding since yesterday. Compared older films, airspace density is worse than was seen 5 days ago. IMPRESSION: No change since yesterday. Worsening bilateral airspace density that could be a combination of pneumonia and edema. Electronically Signed   By: Paulina Fusi M.D.   On: 06/20/2016 07:07   Dg Chest Port 1 View  Result Date: 06/19/2016 CLINICAL DATA:  CHF. EXAM: PORTABLE CHEST 1 VIEW COMPARISON:  13-Jul-2016 . FINDINGS: AICD noted stable position. Prior CABG. Prior cardiac valve repair. Cardiomegaly. Diffuse bilateral pulmonary infiltrates/edema, minimal improvement from prior exam. Bibasilar atelectasis. Bilateral pleural effusions. No pneumothorax. IMPRESSION: 1.  AICD in stable position.  Prior CABG and cardiac valve repair. 2. Cardiomegaly with diffuse bilateral pulmonary infiltrates/edema, minimal improvement from prior exam. 3.  Bibasilar atelectasis. Electronically Signed    By: Maisie Fus  Register   On: 06/19/2016 06:50   Dg Chest Port 1 View  Result Date: 07/13/2016 CLINICAL DATA:  Right-sided central line placement. EXAM: PORTABLE CHEST 1 VIEW COMPARISON:  08/20/2015 at 8:51 a.m. FINDINGS: A new right-sided PICC line is present. The tip is thought to be at the cavoatrial junction  but is somewhat obscured by 3 pacer leads directly overlying it. Prior CABG observed along with a aortic valve repair. Low lung volumes are present with moderate enlargement of the cardiopericardial silhouette and patchy airspace opacities in both lungs favoring the mid lungs and lung bases. Atherosclerotic calcification of the aortic arch noted. IMPRESSION: 1. Right-sided PICC line tip: Cavoatrial junction. No pneumothorax or complicating feature. 2. Enlargement of the cardiopericardial silhouette with suspected pulmonary edema. Low lung volumes. 3. Prior CABG.  Aortic valve repair. Electronically Signed   By: Gaylyn RongWalter  Liebkemann M.D.   On: 06/30/2016 19:50   Dg Chest Port 1 View  Result Date: 06/23/2016 CLINICAL DATA:  Urinary tract infection.  Sepsis and fever. EXAM: PORTABLE CHEST 1 VIEW COMPARISON:  Single-view of the chest 06/17/2016 and 06/15/2016. FINDINGS: Extensive bilateral airspace disease, worse on the right persists but appears mildly improved. No pneumothorax or pleural effusion. There is cardiomegaly. IMPRESSION: Persistent but improved right worse than left airspace disease most compatible with pneumonia. Electronically Signed   By: Drusilla Kannerhomas  Dalessio M.D.   On: 06/17/2016 09:04   Dg Chest Port 1 View  Result Date: 06/17/2016 CLINICAL DATA:  Shortness of breath. EXAM: PORTABLE CHEST 1 VIEW COMPARISON:  06/15/2016. FINDINGS: Cardiac pacer with lead tips over the right atrium right ventricle. Prior CABG. Cardiomegaly with bilateral pulmonary alveolar infiltrates consistent with congestive heart failure. Bilateral pneumonia cannot be excluded. Findings progressed from prior exam. Small  bilateral pleural effusions. No pneumothorax. IMPRESSION: 1. Prior CABG. Cardiac pacer noted with lead tips in right atrium right ventricle. 2. Prominent bilateral pulmonary infiltrates consistent pulmonary edema. Findings progressed from prior exam Small bilateral pleural effusions . Electronically Signed   By: Maisie Fushomas  Register   On: 06/17/2016 07:37   Dg Chest Port 1 View  Result Date: 06/15/2016 CLINICAL DATA:  Hemoptysis. EXAM: PORTABLE CHEST 1 VIEW COMPARISON:  06/23/2016 and chest CT 04/15/2016 FINDINGS: Left-sided pacemaker and sternotomy wires unchanged. Patient is rotated to the left. There is moderate stable elevation of the left hemidiaphragm. Mild opacification adjacent the left hemidiaphragm which may be due to atelectasis. The cannot exclude a small amount left pleural fluid. There is hazy patchy opacification of the right mid to lower lung which may be due to asymmetric edema versus infection. Stable cardiomegaly. Remainder of the exam is unchanged. IMPRESSION: Mild opacification adjacent the elevated left hemidiaphragm suggesting atelectasis. Patchy hazy opacification over the right mid to lower lung which may be due to asymmetric edema versus infection. Possible small amount left pleural fluid. Stable cardiomegaly. Electronically Signed   By: Elberta Fortisaniel  Boyle M.D.   On: 06/15/2016 09:09   Dg Chest Port 1 View  Result Date: 06/12/2016 CLINICAL DATA:  Fever, UTI EXAM: PORTABLE CHEST 1 VIEW COMPARISON:  05/17/2016 FINDINGS: Small left pleural effusion. Trace right pleural effusion. Bilateral interstitial thickening. No pneumothorax. Stable cardiomegaly. Prior CABG. Prior TAVR. Cardiac pacemaker noted. No acute osseous injury. IMPRESSION: 1. Mild CHF. Electronically Signed   By: Elige KoHetal  Patel   On: 06/17/2016 19:22     LOS: 8 days   Jeoffrey MassedGHIMIRE,Felipa Laroche, MD  [facility] Pager:[phone]  If 7PM-7AM, please contact night-coverage [email or web address] Password [email or web address] 09/16/2015, 3:09 PM

## 2016-07-08 NOTE — Discharge Summary (Signed)
Death Summary  Melvin LappingBenjamin W [name] ZOX:[id number] DOB: 1927/07/30 DOA: 07/01/2016  PCP: Colette RibasGOLDING, [name], MD   Admit date: 07/03/2016 Date of Death: 06/14/2016  Final Diagnoses:  Principal Problem:   Sepsis (HCC) Active Problems:   Essential hypertension   HLD (hyperlipidemia)   LBBB (left bundle branch block)   Non-insulin dependent type 2 diabetes mellitus (HCC)   Coronary artery disease involving native heart without angina pectoris   S/P TAVR (transcatheter aortic valve replacement)   Persistent atrial fibrillation (HCC)   Chronic combined systolic and diastolic CHF (congestive heart failure) (HCC)   Hyponatremia   Acute kidney failure (HCC)   Elevated troponin   Anemia   Thrombocytopenia (HCC)   Fever   Goals of care, counseling/discussion   Palliative care encounter   Atrial fibrillation with rapid ventricular response (HCC)   Congestive dilated cardiomyopathy (HCC) - with recent drop in EF    Acute on chronic combined systolic and diastolic CHF, NYHA class 3 (HCC)   Acute respiratory failure with hypoxia (HCC)   HAP (hospital-acquired pneumonia)   Demand ischemia of myocardium (HCC)   Acute systolic congestive heart failure (HCC)   Terminal care    History of present illness:  Patient is a 83 y.o. male with history of severe as s/p TAVR October 2017, history of CAD s/p CABG in 1998 and PCI in 08/2014,hx of complete heart block requiring permanent pacemaker implantation in 05/2016, history of A. fib on anticoagulation with Eliquis [facility] on 12/7 sepsis due to probable UTI. Patient was [facility] and started on broad-spectrum antibiotic, unfortunately hospital course was complicated by worsening acute hypoxemic respiratory failure due to bilateral pulmonary infiltrates, necessitating BiPAP support. 2-D echocardiogram showed worsening EF of around 25%. Patient was subsequently [facility]  evaluation and treatment. Hospital course was further complicated by wide complex tachycardia requiring cardioversion-later thought to be aberrant A. fib with RVR.Unfortunately even with aggressive diuresis, broad-spectrum antibiotics-patient continued to have worsening hypoxia and did not improve. After long discussion with family, he was transitioned to comfort measures on 12/14. The patient subsequently passed away on 12/15.  Hospital Course:  Sepsis secondary to UTI and possible HCAP: Sepsis pathophysiology has resolved. Unfortunately hospital course  complicated by development of severe hypoxia due to bilateral pulmonary infiltrates-not sure if this is all HCAP, suspect some amount of pulmonary edema. Given severity of his hypoxia, worsening leukocytosis-he was started on empiric broad-spectrum antibiotics with vancomycin and cefepime. A repeat blood cultures and 12/12 remain negative. Since he did not improve, after discussion with family, he has now been transitioned to full comfort measures-he no longer is on antimicrobial therapy.The patient subsequently passed away on 12/15.  Acute hypoxemic respiratory failure: Initially Required BiPAP, subsequently transitioned to 100 % NRB. Etiology thought to be a combination of acute systolic heart failure and HCAP. Even with aggressive diuretics and broad-spectrum antibiotic-no improvement, remained very hypoxic and started developing tachypnea. After prolonged discussion with family, transitioned to comfort measures ON 12/14.The patient subsequently passed away on 12/15.  Acute on chronic combined systolic and diastolic CHF: Echo on 12/11 shows EF around 20-25% which is a marked change from a very recent echocardiogram on 11/22 which showed an EF around 45%. Not sure if this is secondary to ischemia (recent cath on 9/29 showed patent grafts-but troponins elevated) or stress induced cardiomyopathy from sepsis. No significant aortic regurgitation seen on  transthoracic echocardiogram to suggest valve malfunction. Although transferred  [facility] for possible right and left heart catheterization-given severity of hypoxia and inability to lie flat-this has been canceled for now. He was managed with aggressive diuresis-but unfortunately continued to be in respiratory failure. After discussion with family, he was transitioned to comfort measures on 12/14. The patient subsequently passed away on 12/15.  Wide complex tachycardia due to aberrant atrial fibrillation with RVR: Occurred on 12/12-initially thought to be ventricular tachycardia-bolused with amiodarone-subsequently EP emergently consulted-and underwent urgent cardioversion with initial restoration of sinus rhythm but recurrence of wide complex tachycardia. Upon further evaluation by EP-now thought to have aberrant atrial fibrillation with RVR. Cardiology followed closely during this hospital stay, and remained on IV amiodarone-till he was transitioned to comfort measures-The patient subsequently passed away on 12/15.  AKI: Suspect this is hemodynamic mediated-probably from cardiorenal syndrome. Initially had AKI that had improved-thought to be 2/2 dehydration along with contributions from lisinopril and Bactrim. Electrolytes were no longer followed once she was transitioned to full comfort measures.   Elevated troponins: Probably secondary to demand ischemia-given advanced age/frailty, profound hypoxia and worsening clinical status- heart catheterization was not planned  Type 2 diabetes: Metformin was held on admission-CBGs were stable with SSI.   History of severe aortic stenosis:underwent TAVR in October 2017. Transthoracic echo done this admission did not show any significant aortic regurgitation to suggest of valve malfunction. Given severity of hypoxia, not a candidate to pursue TEE in this setting. Family and patient both did not desire aggressive interventions-since he did not  improve with antimicrobial therapy and aggressive diuresis-he has now been transitioned to full comfort measures.The patient subsequently passed away on 12/15.  History of complete heart block/tachybradycardia syndrome: s/p implantation of permanent pacemaker on 05/16/16. Paced rhythm on telemetry.  History of CAD s/p CABG in 1998-s/p PCI to saphenous vein graft in 10/2014: Continued aspirin and statin. Has elevated troponin levels-trend is mostly flat-has also developed worsening EF seen on echo this admission-although plans were for cardiac catheterization-given severe hypoxemia and inability to lie flat-this has been canceled for now. He was transitioned to full comfort measures-with no plans to pursue any further investigations.The patient subsequently passed away on 12/15.  Anemia: Secondary to acute illness-CBC was followed closely-but since transitioned to comfort measures-CBC was not followed  Thrombocytopenia: Suspect secondary to sepsis, recent Bactrim therapy. Platelet count normalized.  Elevated liver enzymes: Suspect shock liver due to his cardiac output. Since transitioned to comfort measures, LFTs was not followed.  SignedJeoffrey Massed:  Laurena Valko  [facility] 06/25/2016, 6:20 PM

## 2016-07-08 NOTE — Clinical Social Work Note (Signed)
CS[facility] spoke with [facility]. No bed is availble this morning. Facility will follow up with pt's daughter. CSW will continue to follow.   Dede QuerySarah Aldean Suddeth, MSW, LCSW  Clinical Social Social  [phone]

## 2016-07-08 NOTE — Progress Notes (Addendum)
Daily Progress Note   Patient Name: Melvin [name]       Date: 2016/06/22 DOB: [birth year]  Age: 83 y.o. MRN#: [id number] Attending Physician: Jonetta Osgood, MD Primary Care Physician: Purvis Kilts, MD Admit Date: 06/14/2016  Reason for Consultation/Follow-up: Disposition, Establishing goals of care and Psychosocial/spiritual support  Subjective: Melvin [name] appears much more uncomfortable compared to yesterday. He is increasingly short of breath with marked increase in his work of breathing. His wife, daughter, and pastor are at his bedside, and also verbalize a concern for his worsening respiratory status. Melvin [name] is now endorsing shortness of breath (similar RR to yesterday, however previously denied discomfort), as well as lower back discomfort. He is, however, more tired and withdrawn compared to yesterday, and appears to be rapidly tiring.   Length of Stay: 8  Current Medications: Scheduled Meds:  . gabapentin  300 mg Oral QHS  . mouth rinse  15 mL Mouth Rinse q12n4p  . senna  2 tablet Oral BID  . sodium chloride flush  3 mL Intravenous Q12H    Continuous Infusions: . HYDROmorphone      PRN Meds: acetaminophen (TYLENOL) oral liquid 160 mg/5 mL, antiseptic oral rinse, glycopyrrolate, guaiFENesin-dextromethorphan, [DISCONTINUED] haloperidol **OR** haloperidol lactate, HYDROmorphone, [DISCONTINUED] ondansetron **OR** ondansetron (ZOFRAN) IV, polyvinyl alcohol, sodium chloride, sodium chloride flush  Physical Exam      Constitutional: He is oriented to person, place, and time.  Frail elderly man in bed with NRB mask  HENT:  Head: Normocephalic and atraumatic.  Eyes: EOM are normal.  Neck: Normal range of motion.  Cardiovascular:  He remains on monitoring cables, however monitor at bedside is off. Per hall  monitor, his HR is 133 and remains in what appears to be intermittent Vtach (though likely A. Fib with RVR) Pulmonary/Chest:  NRB mask, increased RR compared to yesterday. Increased WOB compared to yesterday. Mild audible secretions he is not able to expectorate. Musculoskeletal: Normal range of motion.  Neurological: He is oriented to person, place, and time. He is more withdrawn and lethargic today. Skin: Skin is warm and dry. There is pallor.  Psychiatric: He has a normal mood and affect. Judgment and thought content normal. Cognition and memory are normal.      Vital Signs: BP 115/63 (BP Location: Left Arm)   Pulse (!) 129   Temp 98.1 F (36.7 C) (Axillary)   Resp (!) 31   Ht '5\' 6"'$  (1.676 m)   Wt 74.1 kg (163 lb 4.8 oz)   SpO2 92%   BMI 26.36 kg/m  SpO2: SpO2: 92 % O2 Device: O2 Device: Not Delivered O2 Flow Rate: O2 Flow Rate (L/min): 15 L/min  Intake/output summary:   Intake/Output Summary (Last 24 hours) at 06/22/2016 1350 Last data filed at 2016/06/22 0700  Gross per 24 hour  Intake           688.16 ml  Output             1650 ml  Net          -961.84 ml   LBM: Last BM Date: 06/20/16 Baseline Weight: Weight: 74.4 kg (164 lb) Most recent weight: Weight: 74.1 kg (163 lb 4.8 oz)  Flowsheet Rows   Flowsheet Row Most Recent Value  Intake Tab  Referral Department  Hospitalist  Unit at Time of Referral  Cardiac/Telemetry Unit  Palliative Care Primary Diagnosis  Cardiac  Date Notified  06/30/2016  Palliative Care Type  New Palliative care  Reason for referral  Clarify Goals of Care  Date of Admission  06/29/2016  # of days IP prior to Palliative referral  5  Clinical Assessment  Psychosocial & Spiritual Assessment  Palliative Care Outcomes  Patient/Family meeting held?  Yes  Who was at the meeting?  Met with wife and daughter outside of the room, and with pt in his room  Palliative Care Outcomes  Provided psychosocial or spiritual support, Clarified goals of care       Patient Active Problem List   Diagnosis Date Noted  . Acute systolic congestive heart failure ([place])   . Terminal care   . Atrial fibrillation with rapid ventricular response ([place]) 06/19/2016  . Congestive dilated cardiomyopathy ([place]) - with recent drop in EF  06/19/2016  . Acute on chronic combined systolic and diastolic CHF, NYHA class 3 ([place]) 06/19/2016  . Acute respiratory failure with hypoxia ([place]) 06/19/2016  . HAP (hospital-acquired pneumonia) 06/19/2016  . Demand ischemia of myocardium ([place]) 06/19/2016  . Fever   . Goals of care, counseling/discussion   . Palliative care encounter   . Sepsis ([place]) 06/26/2016  . Hyponatremia 07/04/2016  . Acute renal failure ([place]) 06/11/2016  . Elevated troponin 06/30/2016  . Anemia 06/19/2016  . Thrombocytopenia ([place]) 06/28/2016  . Status post biventricular pacemaker 05/20/2016  . Chronic combined systolic and diastolic CHF (congestive heart failure) ([place]) 05/20/2016  . Persistent atrial fibrillation ([place]) 05/16/2016  . Complete heart block ([place]) 05/11/2016  . S/P TAVR (transcatheter aortic valve replacement) 05/11/2016  . Skin lesion 02/11/2015  . Coronary artery disease involving native heart without angina pectoris 09/28/2014  . SOB (shortness of breath) 04/26/2014  . Tachycardia-bradycardia syndrome ([place]) 04/26/2014  . Non-insulin dependent type 2 diabetes mellitus ([place]) 07/02/2013  . Osteoporosis, unspecified 06/24/2013  . Edema, lower extremity 06/22/2013  . LBBB (left bundle branch block) 12/01/2012  . Heart palpitations 12/01/2012  . Unspecified hereditary and idiopathic peripheral neuropathy 10/01/2012  . RLS (restless legs syndrome) 10/01/2012  . Essential hypertension 11/26/2011  . HLD (hyperlipidemia) 11/26/2011  . Liver lesion 11/26/2011    Palliative Care Assessment & Plan   HPI: 83 y.o. male  with past medical history of CAD s/p CABG, aortic stenosis s/p TAVR, hypertension, hyperlipidemia, diabetes, persistent  atrial fibrillation, and tachy brady syndrome s/p PPM implant 05/2016. He was admitted on 06/09/2016 with fever, hemoptysis, and shortness of breath. Work-up revealed Urosepsis,  Acute on chronic systolic heart failure (EF 20-25%, prior echo in November with EF 45-50%), and Pneumonia with resultant acute respiratory failure requiring BiPAP. He has also developed A. Fib with RVR with unsuccessful cardioversion.   Assessment: Dr. Sloan Leiter and myself met with Mr. Nordquist, his wife, and his daughter yesterday afternoon. In that discussion we reviewed the efforts that had been taken to help improve him, as well as the sad reality that these efforts did not provide substantial improvement. In this conversation we re-addressed the previously stated goals of care, and discussed that the interventions we could to would not promote his quality of life, and may only prolong it with the potential for increased suffering. Mr. Lease and his family decided to pursue comfort care only, with transition to residential hospice. Unfortunately, the hospice facility closest  to their home ([facility]) does not have beds available at present.   Today, Mr. Nole is clearly deteriorating with increased symptom burden, specifically shortness of breath. He has been reticent to ask for medication, per his nurse, related to fear of being a burden. His family is supportive at the bedside, though clearly overwhelmed and hesitant to engage in in-depth conversations or make complex medical decisions.   Recommendations/Plan:  DNR/DNI. Comfort care only. SW working on residential hospice placement; the window for transfer to another facility is rapidly closing, he may not be able to make the trip past today (though our team will assess prior to transport to ensure stability). This was discussed with the family.  Start dilaudid infusion with PRN dosing. Titration orders written based on bolus use, and reviewed with care nurse and  attending physician.   Will stop amiodarone drip today, which I discussed with Mr Derusha and his family. Plan to aggressively treat his symptoms with scheduled and PRN medication; did not address O2 use with NRB as family clearly overwhelmed by extensive care discussions, and I do not believe O2 will change the outcome or timeline for him.  Code Status:  DNR/DNI  Prognosis:   Hours - Days  Discharge Planning:  Hospice facility if able.  Care plan was discussed with pt, pt's wife and daughter, care nurse, Social work, and primary attending  Thank you for allowing the Palliative Medicine Team to assist in the care of this patient.   Time In: 1330 Time Out: 1430 Total Time 60 minutes Prolonged Time Billed  yes      Greater than 50%  of this time was spent counseling and coordinating care related to the above assessment and plan.  Charlynn Court, NP Palliative Medicine Team Team Phone # [phone]

## 2016-07-08 NOTE — Clinical Social Work Note (Signed)
CSW consulted for Residential Hospice. Per Palliative Care NP, pt's family would like [facility] [place]. CSW spoke with pt's daughter, who is agreeable to discharge plan. CSW provided supportive counseling. CSW sent referral to [facility]. There is not a bed available. Pt is currently on a non-rebreather. RNCM is aware of dispo. CSW also updated Palliative Care NP. CSW will continue to follow.   Dede QuerySarah Tacarra Justo, MSW, LCSW  Clinical Social Worker  [phone]

## 2016-07-08 NOTE — Clinical Social Work Note (Deleted)
CSW spoke with [facility]. No bed is available this morning. Facility will follow up with family.

## 2016-07-08 NOTE — Progress Notes (Signed)
Pt passed away at 2000pm, family at bedside. Kennon RoundsSally RN and [facility] RN auscultated for 3mins without any heart or lung sounds.  80mg  of Dilaudid wasted in sink by Devota PaceKaylee Chirag Krueger RN and Rocco Paulsara Tomlinson RN. Family given [facility] package from [employer]. [place] donor notified at 2018.

## 2016-07-08 DEATH — deceased

## 2016-08-16 ENCOUNTER — Encounter: Payer: Medicare Other | Admitting: Internal Medicine

## 2016-12-13 IMAGING — CR DG CHEST 1V PORT
1 series · 1 of 1 positions shown · non-contrast
Comparison: 05/17/2016

CLINICAL DATA: Fever, UTI

EXAM:
PORTABLE CHEST 1 VIEW

[ap]
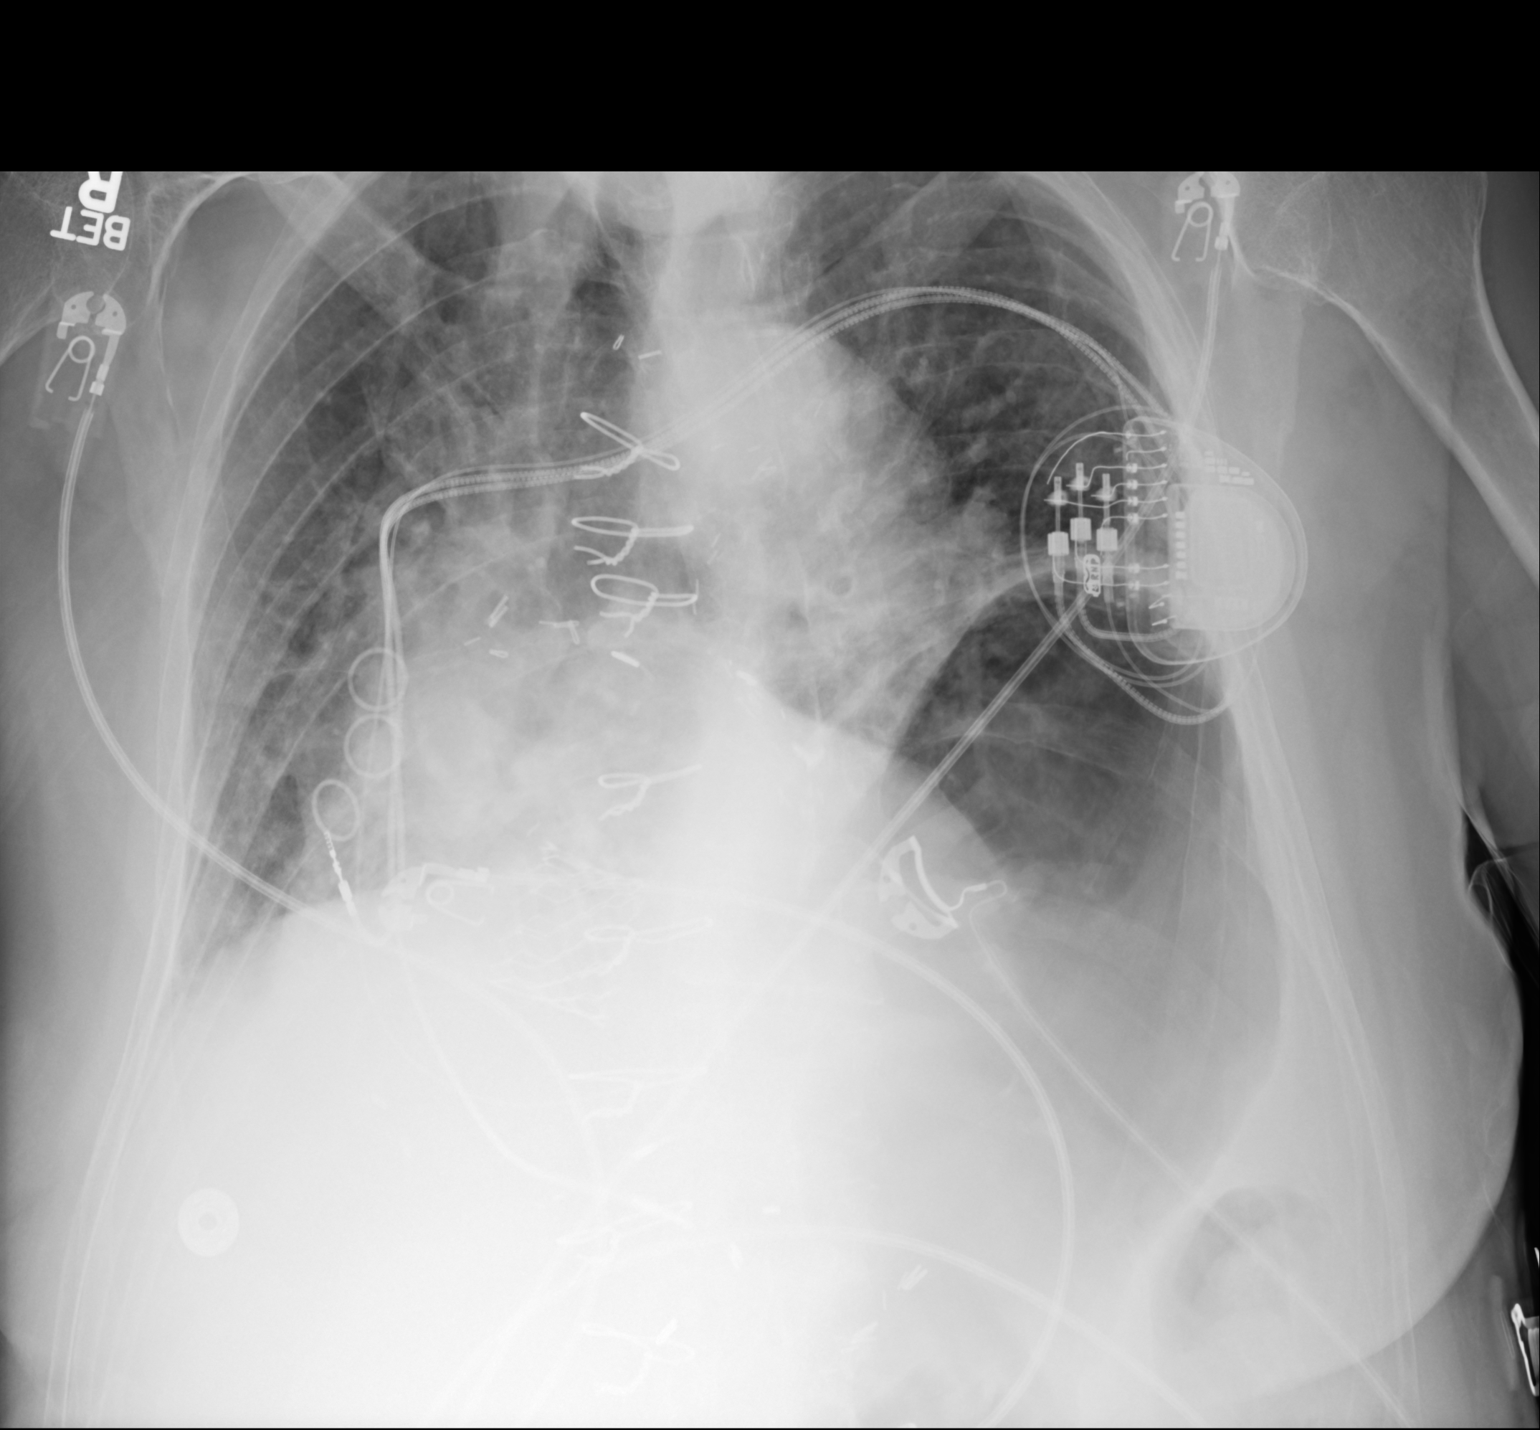

[1 of 1 positions shown; findings below may reference images not displayed]

FINDINGS: Small left pleural effusion. Trace right pleural effusion. Bilateral
interstitial thickening. No pneumothorax. Stable cardiomegaly. Prior
CABG. Prior TAVR. Cardiac pacemaker noted.

No acute osseous injury.
IMPRESSION: 1. Mild CHF.

## 2016-12-15 IMAGING — CR DG CHEST 1V PORT
1 series · 1 of 1 positions shown · non-contrast
Comparison: 06/13/2016 and chest CT 04/15/2016

CLINICAL DATA: Hemoptysis.

EXAM:
PORTABLE CHEST 1 VIEW

[ap]
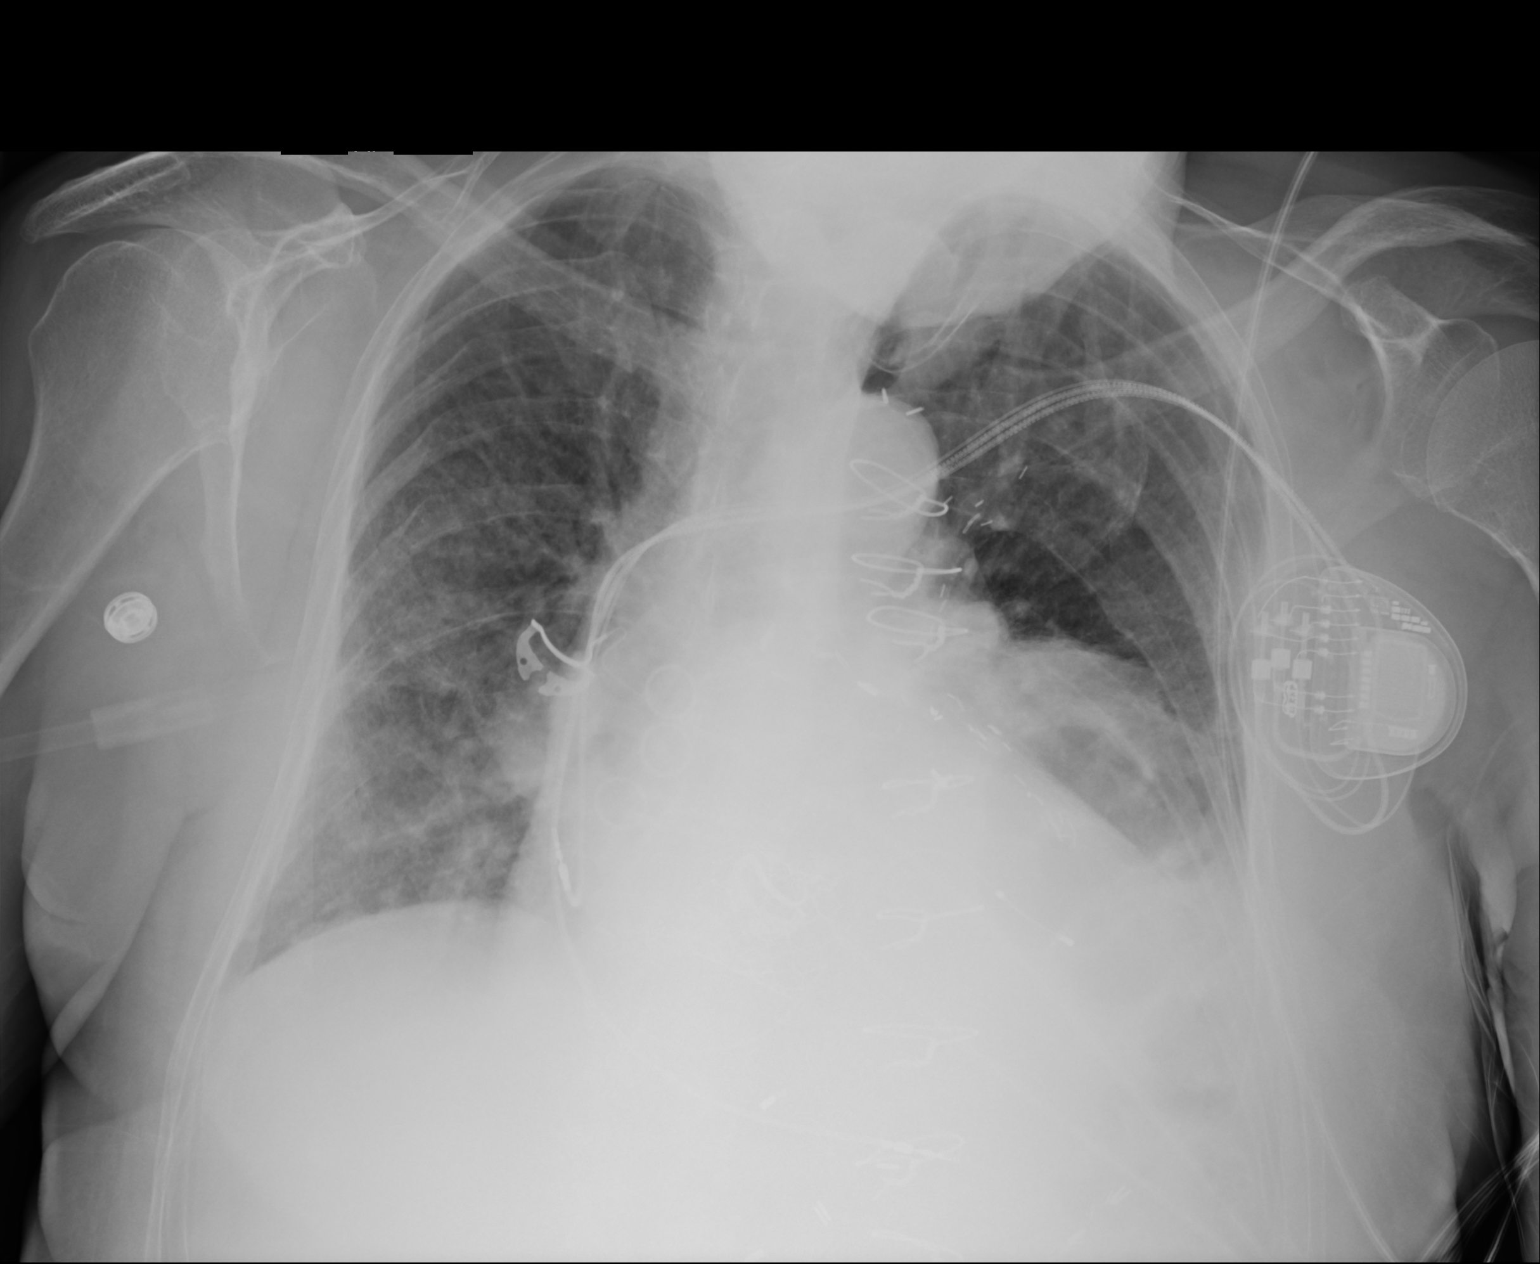

[1 of 1 positions shown; findings below may reference images not displayed]

FINDINGS: Left-sided pacemaker and sternotomy wires unchanged. Patient is
rotated to the left. There is moderate stable elevation of the left
hemidiaphragm. Mild opacification adjacent the left hemidiaphragm
which may be due to atelectasis. The cannot exclude a small amount
left pleural fluid. There is hazy patchy opacification of the right
mid to lower lung which may be due to asymmetric edema versus
infection. Stable cardiomegaly. Remainder of the exam is unchanged.
IMPRESSION: Mild opacification adjacent the elevated left hemidiaphragm
suggesting atelectasis. Patchy hazy opacification over the right mid
to lower lung which may be due to asymmetric edema versus infection.
Possible small amount left pleural fluid.

Stable cardiomegaly.

## 2016-12-18 IMAGING — CR DG CHEST 1V PORT
1 series · 1 of 1 positions shown · non-contrast
Comparison: 08/20/2015 at [DATE] a.m.

CLINICAL DATA: Right-sided central line placement.

EXAM:
PORTABLE CHEST 1 VIEW

[AP]
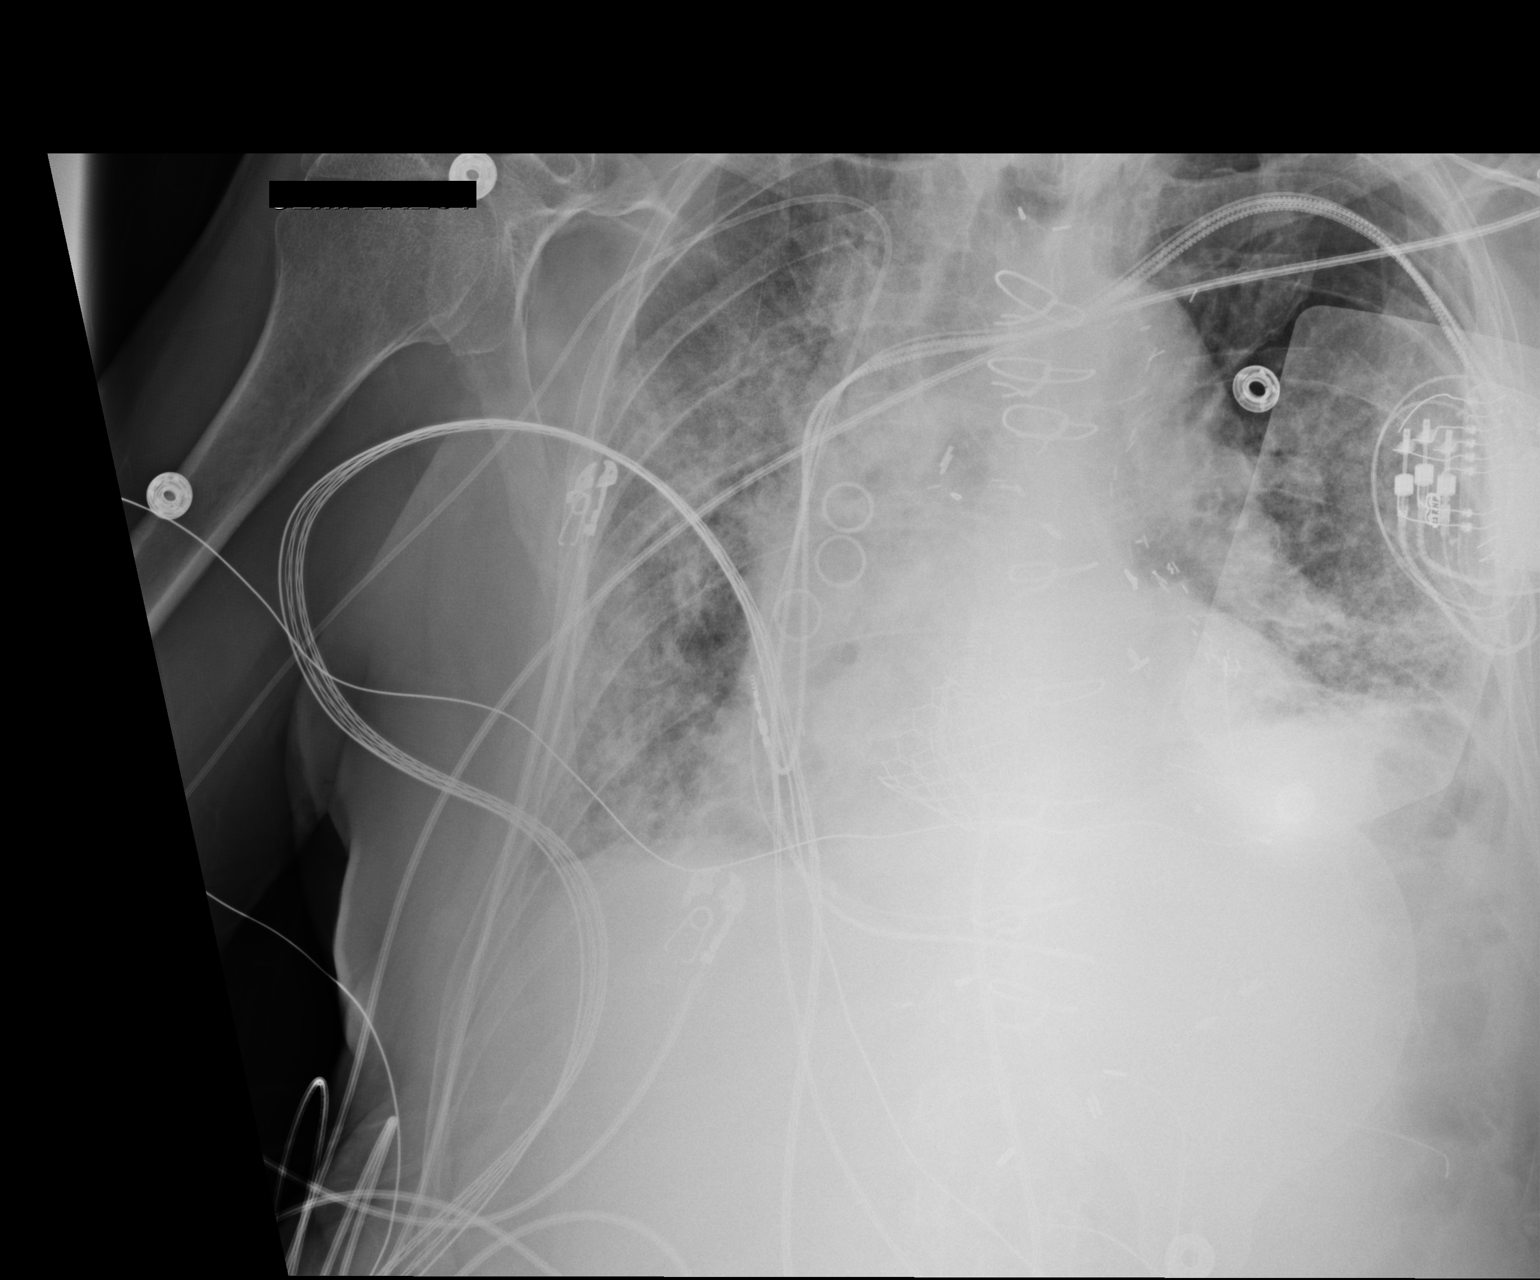

[1 of 1 positions shown; findings below may reference images not displayed]

FINDINGS: A new right-sided PICC line is present. The tip is thought to be at
the cavoatrial junction but is somewhat obscured by 3 pacer leads
directly overlying it. Prior CABG observed along with a aortic valve
repair. Low lung volumes are present with moderate enlargement of
the cardiopericardial silhouette and patchy airspace opacities in
both lungs favoring the mid lungs and lung bases. Atherosclerotic
calcification of the aortic arch noted.
IMPRESSION: 1. Right-sided PICC line tip: Cavoatrial junction. No pneumothorax
or complicating feature.
2. Enlargement of the cardiopericardial silhouette with suspected
pulmonary edema. Low lung volumes.
3. Prior CABG.  Aortic valve repair.

## 2016-12-18 IMAGING — CR DG CHEST 1V PORT
1 series · 1 of 1 positions shown · non-contrast
Comparison: Single-view of the chest 06/17/2016 and 06/15/2016.

CLINICAL DATA: Urinary tract infection.  Sepsis and fever.

EXAM:
PORTABLE CHEST 1 VIEW

[AP]
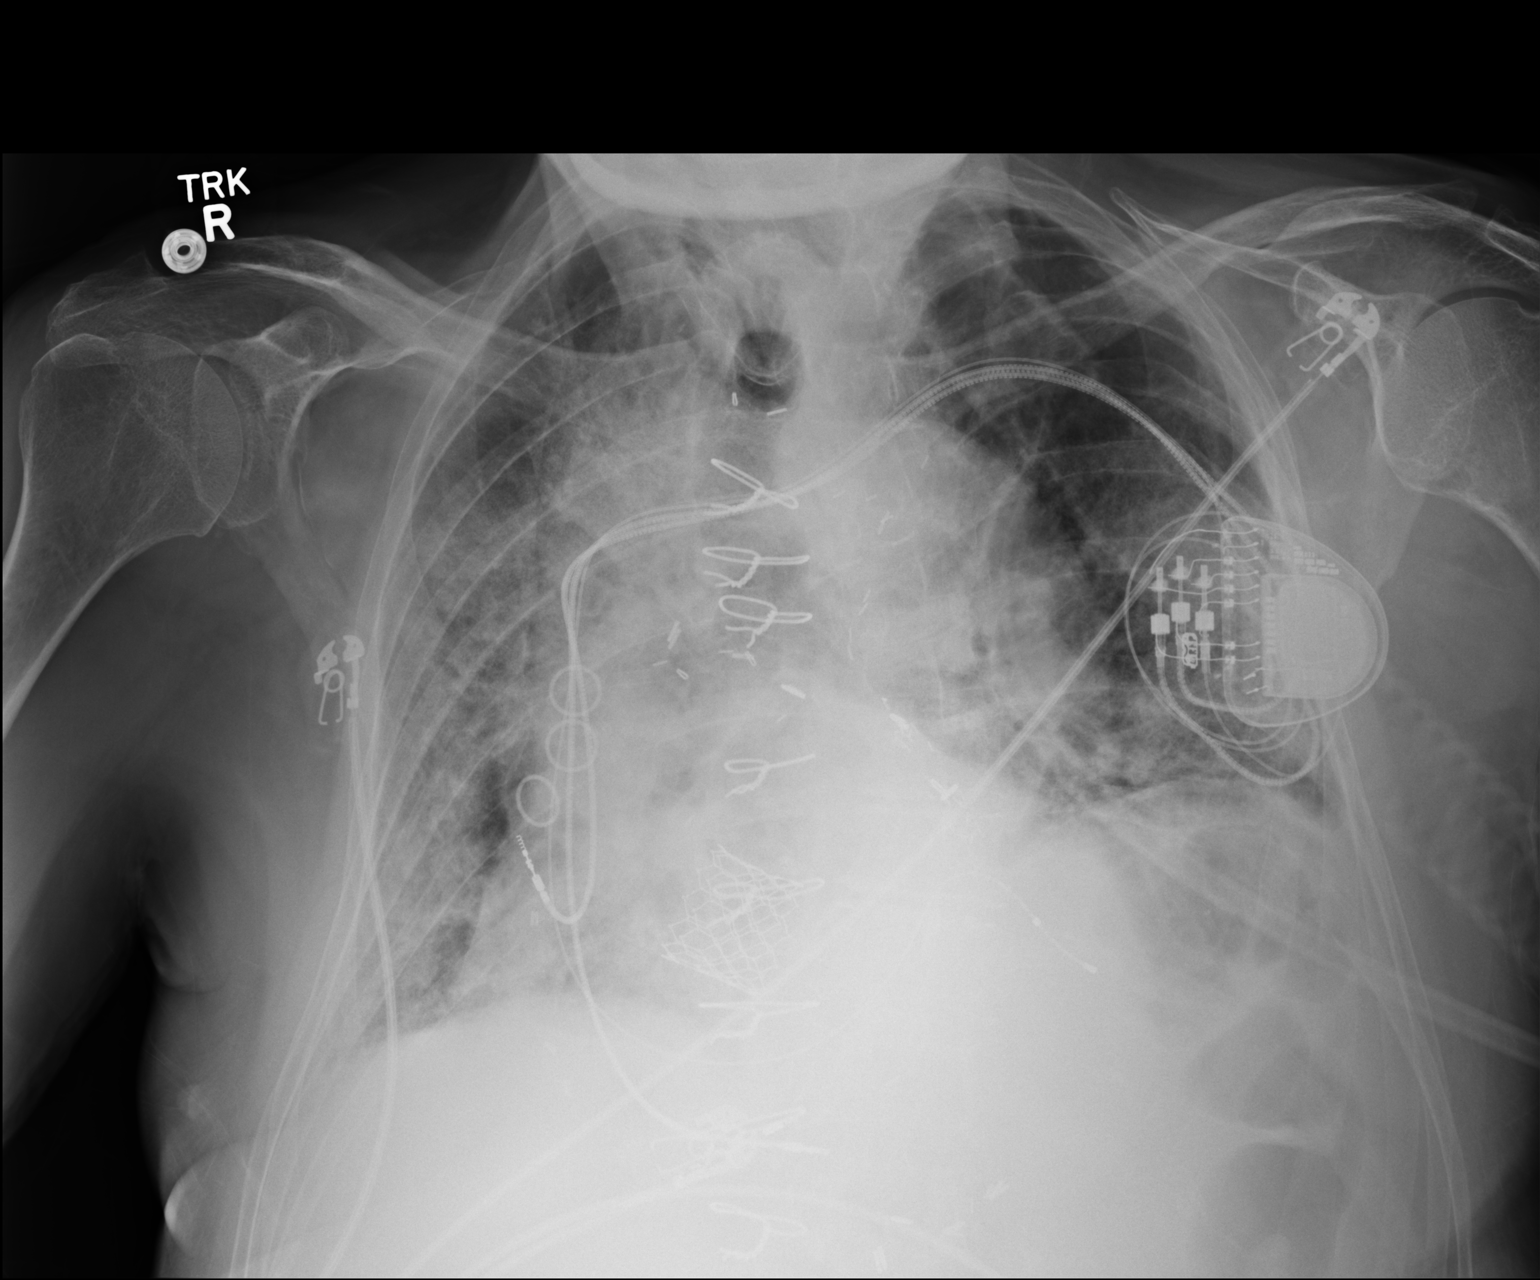

[1 of 1 positions shown; findings below may reference images not displayed]

FINDINGS: Extensive bilateral airspace disease, worse on the right persists
but appears mildly improved. No pneumothorax or pleural effusion.
There is cardiomegaly.
IMPRESSION: Persistent but improved right worse than left airspace disease most
compatible with pneumonia.

## 2017-02-24 ENCOUNTER — Ambulatory Visit (INDEPENDENT_AMBULATORY_CARE_PROVIDER_SITE_OTHER): Payer: Medicare Other | Admitting: Ophthalmology

## 2017-12-07 IMAGING — US US ABDOMEN COMPLETE
1 series · 13 of 25 positions shown · non-contrast
Comparison: 04/15/2016

CLINICAL DATA: Renal failure

EXAM:
ABDOMEN ULTRASOUND COMPLETE

[Series 1: us abdomen complete · 0.18mm/px · 13 of 175 slices shown]
[im 1/175]
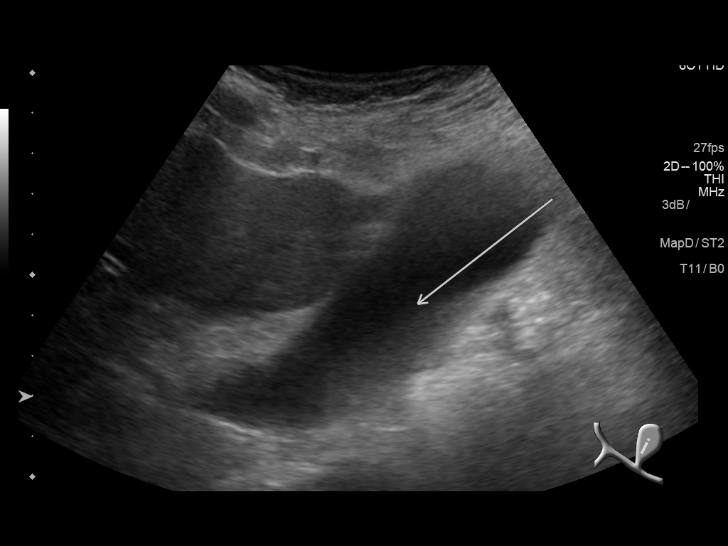
[im 15/175]
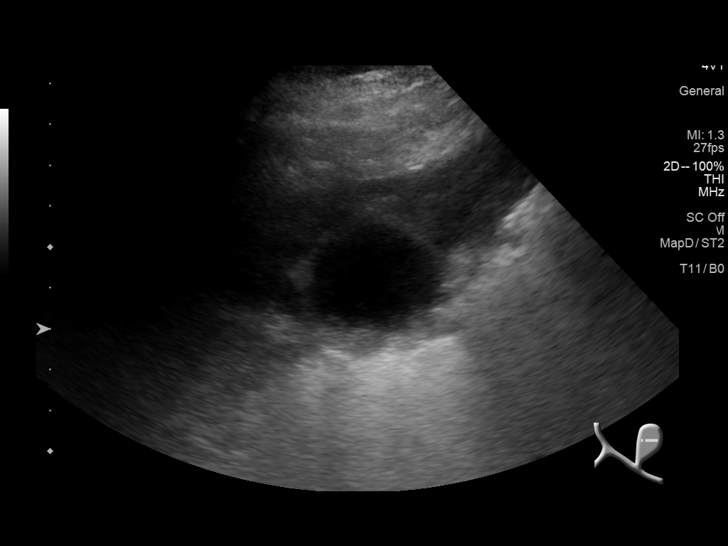
[im 30/175]
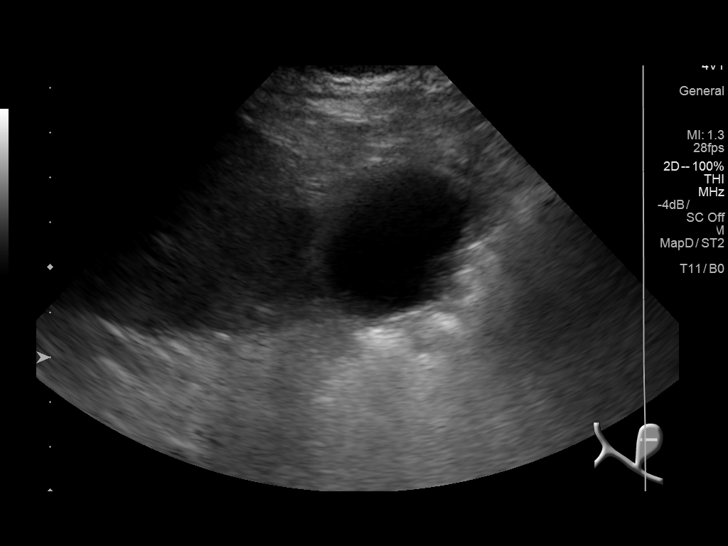
[im 44/175]
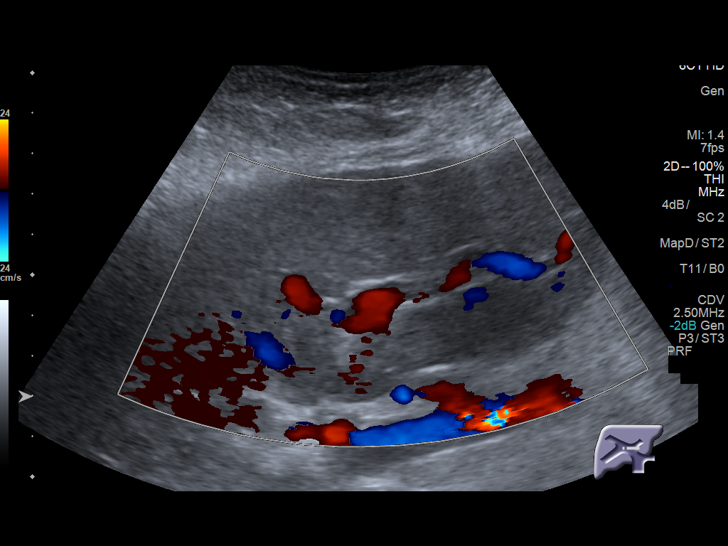
[im 59/175]
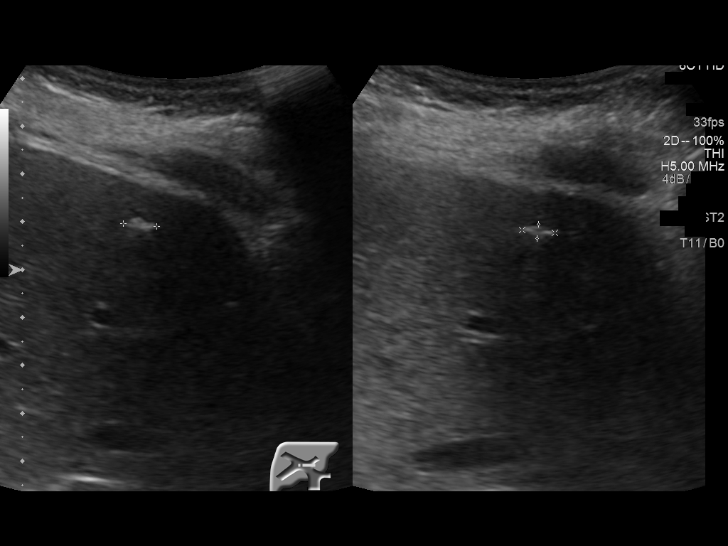
[im 73/175]
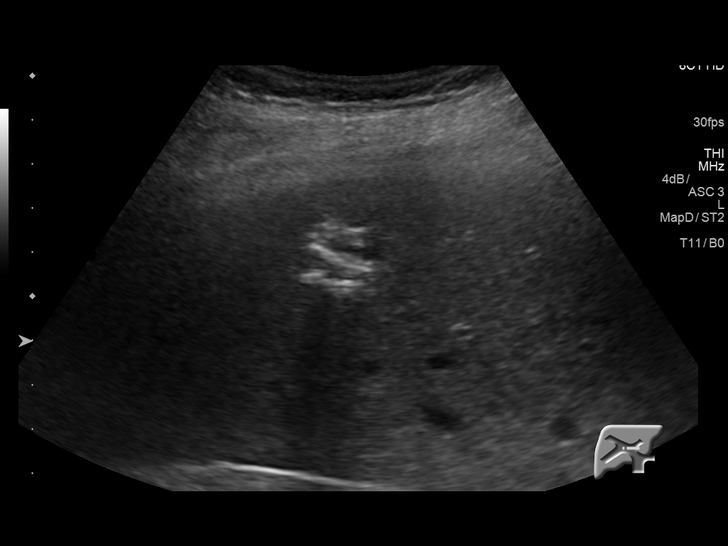
[im 88/175]
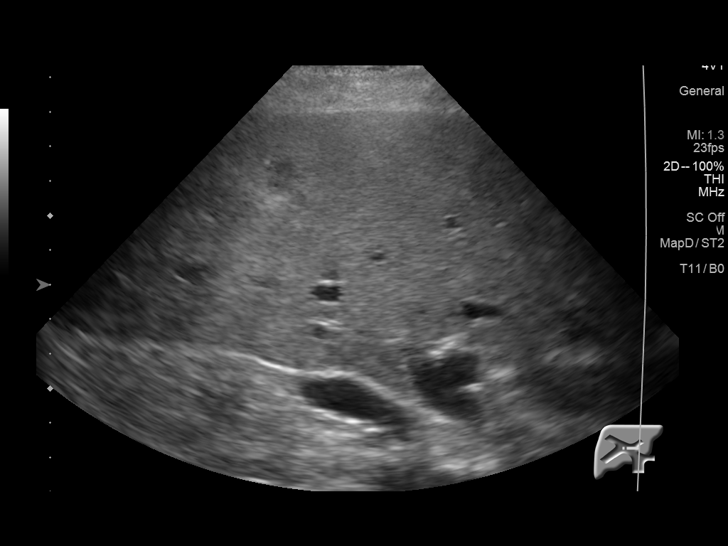
[im 102/175]
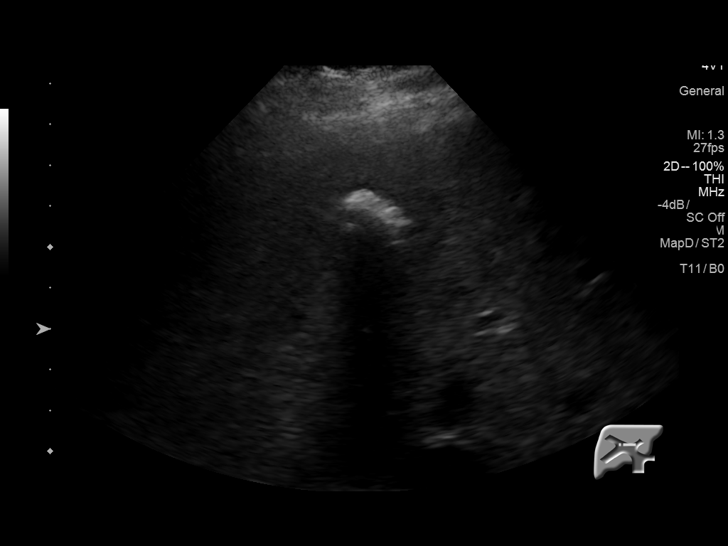
[im 117/175]
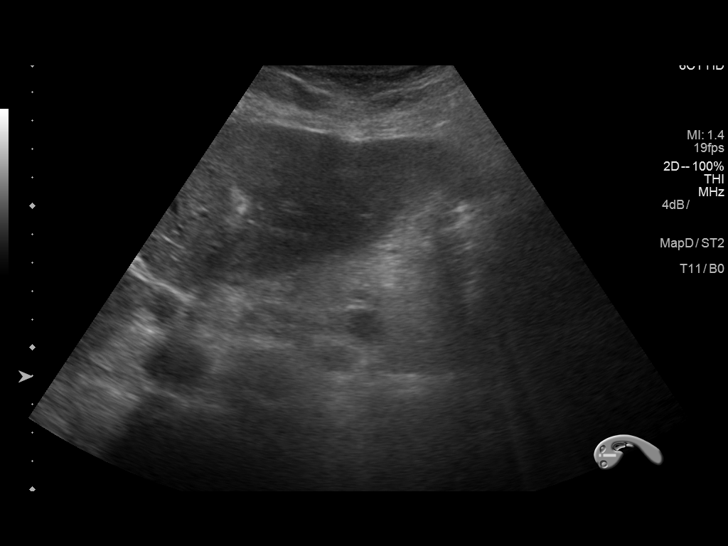
[im 131/175]
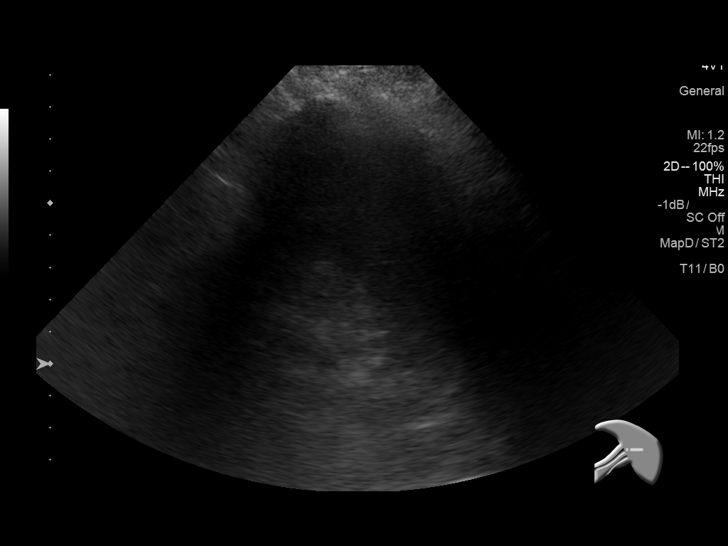
[im 146/175]
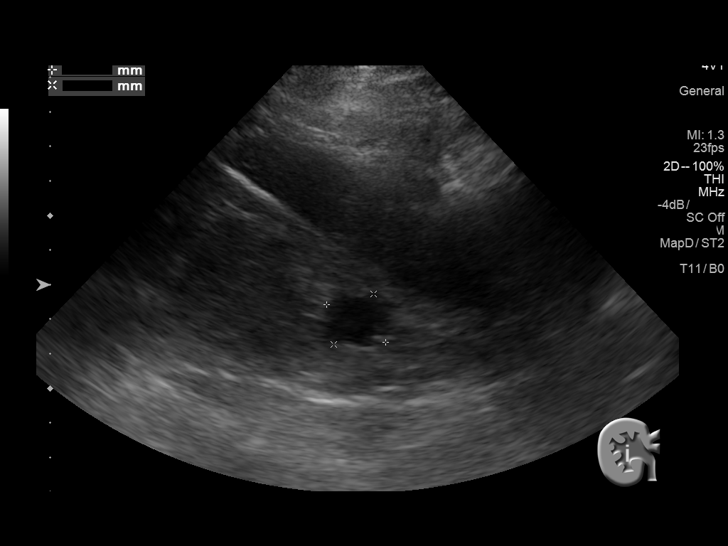
[im 160/175]
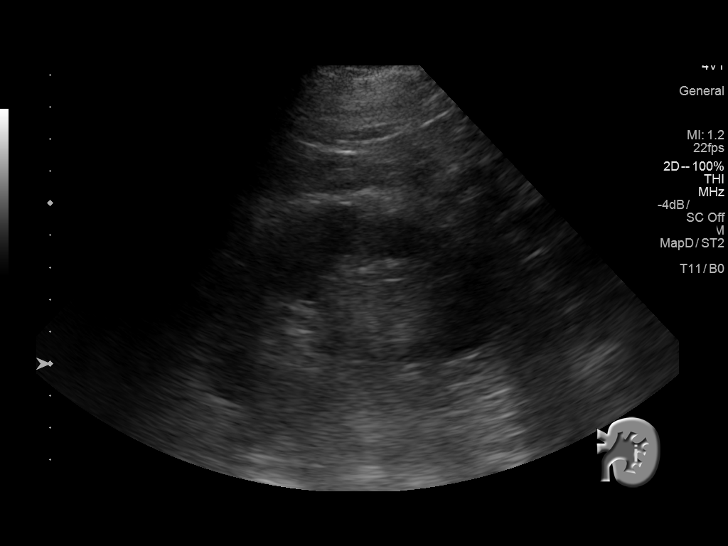
[im 175/175]
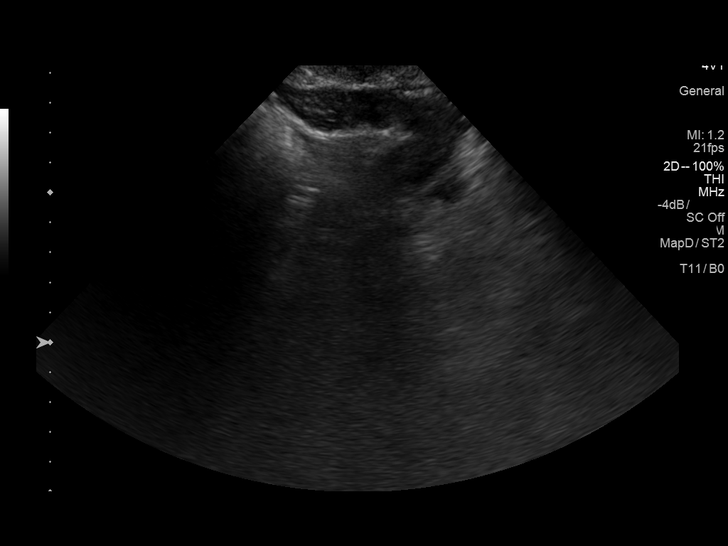

[13 of 25 positions shown; findings below may reference images not displayed]

FINDINGS: Gallbladder: Hypoechoic intraluminal sludge layering in the
gallbladder. No definite gallstones. Wall thickness measures 2.9 mm.
No Murphy's sign elicited or visualized pericholecystic fluid.

Common bile duct: Diameter: 4 mm

Liver: Background echogenicity is normal. Mild nodularity to the
liver surface suggesting a component of cirrhosis. Patent hepatic
and portal veins. Scattered echogenic lesions in the liver (2
visualized). Largest echogenic lesion has associated posterior
acoustic shadowing compatible with calcification measuring 2.0 x
x 1.9 cm. This could represent complex calcified cyst, granulomata
versus partially calcified hemangioma.

IVC: No abnormality visualized.

Pancreas: Visualized portion unremarkable.

Spleen: Limited visualization because of bowel gas appear

Right Kidney: Length: 10.3 cm. Slight increased echogenicity and
cortical thinning. No hydronephrosis. Minimally complex hypoechoic
cyst in the lower pole measures 2 cm.

Left Kidney: Length: 10.0 cm. Normal echogenicity and preserved
cortex. No hydronephrosis. Limited visualization because of bowel
gas.

Abdominal aorta: Obscured by bowel gas.

Other findings: No free fluid.
IMPRESSION: Gallbladder sludge without cholelithiasis or signs of cholecystitis.

No biliary dilatation

Nonspecific echogenic lesions in the liver, suspect hepatic
granulomata versus partially calcified hemangiomas. These have a
benign appearance.

2 cm right lower pole renal cyst

Obscured aorta by bowel gas

No free fluid or acute process by ultrasound
